# Patient Record
Sex: Male | Born: 1975
Health system: Southern US, Community
[De-identification: ages and names within clinical notes are randomized; demographics above are authoritative.]

## PROBLEM LIST (undated history)

## (undated) DIAGNOSIS — K760 Fatty (change of) liver, not elsewhere classified: Secondary | ICD-10-CM

## (undated) DIAGNOSIS — F329 Major depressive disorder, single episode, unspecified: Secondary | ICD-10-CM

## (undated) DIAGNOSIS — K921 Melena: Secondary | ICD-10-CM

## (undated) DIAGNOSIS — F1911 Other psychoactive substance abuse, in remission: Secondary | ICD-10-CM

## (undated) DIAGNOSIS — F32A Depression, unspecified: Secondary | ICD-10-CM

## (undated) DIAGNOSIS — R51 Headache: Secondary | ICD-10-CM

## (undated) DIAGNOSIS — R7989 Other specified abnormal findings of blood chemistry: Secondary | ICD-10-CM

## (undated) DIAGNOSIS — F4310 Post-traumatic stress disorder, unspecified: Secondary | ICD-10-CM

## (undated) DIAGNOSIS — F319 Bipolar disorder, unspecified: Secondary | ICD-10-CM

## (undated) DIAGNOSIS — I10 Essential (primary) hypertension: Secondary | ICD-10-CM

## (undated) DIAGNOSIS — J189 Pneumonia, unspecified organism: Secondary | ICD-10-CM

## (undated) DIAGNOSIS — R519 Headache, unspecified: Secondary | ICD-10-CM

## (undated) DIAGNOSIS — F1011 Alcohol abuse, in remission: Secondary | ICD-10-CM

## (undated) HISTORY — DX: Post-traumatic stress disorder, unspecified: F43.10

## (undated) HISTORY — DX: Other specified abnormal findings of blood chemistry: R79.89

## (undated) HISTORY — DX: Essential (primary) hypertension: I10

## (undated) HISTORY — DX: Major depressive disorder, single episode, unspecified: F32.9

## (undated) HISTORY — DX: Depression, unspecified: F32.A

## (undated) HISTORY — PX: APPENDECTOMY: SHX54

## (undated) HISTORY — DX: Pneumonia, unspecified organism: J18.9

## (undated) HISTORY — DX: Alcohol abuse, in remission: F10.11

## (undated) HISTORY — DX: Bipolar disorder, unspecified: F31.9

## (undated) HISTORY — DX: Other psychoactive substance abuse, in remission: F19.11

## (undated) HISTORY — DX: Melena: K92.1

---

## 2004-09-15 ENCOUNTER — Ambulatory Visit: Payer: Self-pay | Admitting: Family Medicine

## 2005-04-26 ENCOUNTER — Ambulatory Visit: Payer: Self-pay | Admitting: Family Medicine

## 2005-05-08 ENCOUNTER — Ambulatory Visit: Payer: Self-pay | Admitting: Family Medicine

## 2005-10-31 ENCOUNTER — Encounter: Admission: RE | Admit: 2005-10-31 | Discharge: 2005-10-31 | Payer: Self-pay | Admitting: Urology

## 2005-11-05 ENCOUNTER — Ambulatory Visit (HOSPITAL_COMMUNITY): Admission: RE | Admit: 2005-11-05 | Discharge: 2005-11-05 | Payer: Self-pay | Admitting: Interventional Radiology

## 2006-03-25 ENCOUNTER — Emergency Department (HOSPITAL_COMMUNITY): Admission: EM | Admit: 2006-03-25 | Discharge: 2006-03-25 | Payer: Self-pay | Admitting: Emergency Medicine

## 2006-04-01 ENCOUNTER — Emergency Department (HOSPITAL_COMMUNITY): Admission: EM | Admit: 2006-04-01 | Discharge: 2006-04-01 | Payer: Self-pay | Admitting: Emergency Medicine

## 2007-05-11 ENCOUNTER — Emergency Department (HOSPITAL_COMMUNITY): Admission: EM | Admit: 2007-05-11 | Discharge: 2007-05-11 | Payer: Self-pay | Admitting: Emergency Medicine

## 2011-02-16 NOTE — Consult Note (Signed)
Villa, Johnny             ACCOUNT NO.:  [id number]   MEDICAL RECORD NO.:  [id number]          PATIENT TYPE:  EMS   LOCATION:  [facility]                         FACILITY:  [facility]   PHYSICIAN:  Pramod P. Pearlean Brownie, MD    DATE OF BIRTH:  October 29, 1975   DATE OF CONSULTATION:  DATE OF DISCHARGE:  03/25/2006                                   CONSULTATION   REASON FOR REFERRAL:  Neck and back pain.   HISTORY OF PRESENT ILLNESS:  [name] is a 35 year old Caucasian male who  has been complaining of severe neck and back pain for the last several  months.  He apparently has a known history of old C2 spine injury following  college football injury.  He has also had some low back pain which he calls  as pain from having six lumbar vertebrae.  He claims he has had extensive  neurological workup done in [place], where he has seen a neurologist,  Dr. Loleta Chance.  He has undergone an MRI scan of cervical and lumbar spine and has  been found to have significant degenerative C-spine and lower L-spine  disease.  He was in fact referred to a neurosurgeon, Dr. Casilda Carls, but  the patient has not seen him yet.  He has had the constant severe pain for  the last one month.  In fact, for the last three days he claims he has not  slept.  He was given prescription narcotics by Dr. Loleta Chance, but he has not been  happy with his practice and was planning to shift is care to the [place]  area.  The patient denies any significant pain from his neck shooting down  his arm but states the pain shoots down the spine.  He said he had a  prescription for hydrocodone, but his girlfriend flushed it down the toilet  three days ago, and he has been in such severe pain that he has not slept.  He denies significant trouble walking, lack of bladder or bowel control, or  numbness or lack of feeling in his hands or legs.  He is otherwise healthy  and has had no other medical problems.  He claims he has tried Lyrica which  he  reacted to, and he did not like Topamax either.  He has tried muscle  relaxants which have not worked.  He has been to a [place] and recently  went to him two days ago.  Actually, the pain got worse since that visit.   MEDICATION ALLERGIES:  None.   HOME MEDICATIONS:  1.  Keppra 3 g a day.  2.  Valium mg 3 times a day.  3.  Hydrocodone as needed.   PAST SURGICAL HISTORY:  None.   SOCIAL HISTORY:  The patient lives with his girlfriend.  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  He denies abusing drugs.   REVIEW OF SYSTEMS:  As stated above, positive for neck and back pain, gait  difficulties, difficulty sleeping.   PHYSICAL EXAMINATION:  GENERAL:  A young healthy-looking Caucasian male who  is not in distress.  VITAL  SIGNS:  Afebrile.  Pulse is 70 per minute, regular, respiratory rate  16 per minute.  Distal pulses well felt.  HEENT:  Head is nontraumatic.  NECK:  Supple.  I did not see significant tenderness or muscle spasm.  CARDIAC:  Regular heart sounds.  NEUROLOGIC:  Pleasant, awake, alert, cooperative, with no aphasia, apraxia,  or dysarthria.  Pupils are equal, reactive to light and accommodation.  Face  is symmetric.  There are a few beats of end-gaze nystagmus in either  direction on horizontal gaze.  Palatal movements are normal.  Tongue is  midline.  Motor system exam reveals no upper extremity drift, symmetric  strength, tone.  Reflexes all brisk.  Plantars are downgoing.  Straight leg  raising test is negative bilaterally.  Examination of the low back does not  reveal significant muscle spasm or deformity.  The patient complains of  diffuse tenderness throughout over the entire spine.  His gait is painful  and slow.   DATA REVIEWED:  No previous records are available.  Treatment records from  Dr. his office awaited.   IMPRESSION:  A 35 year old male with severe neck and low back pain which  appears to be chronic and musculoskeletal in etiology.  The  neurological  exam is nonfocal, but the patient claims he has significant degenerative C-  and L-spine disease and has had a recent workup in [place] which is  not available yet.  The patient seems to be in significant distress.  I am  reluctant to prescribe long-term narcotics to this patient without reviewing  his previous records.  I have explained to him that it is not my  neurological practice to treat patient with long- narcotics. However, we are  willing to treat him with short-term narcotics for his present pain, but I  have made him a referral to a pain clinic, to Dr. Sheran Luz, for more  definitive treatment for his degenerative disease.  I have advised him to  start Neurontin 300, 3 times a day, in addition to his Keppra.  He will  follow up in the future with his neurologist, Dr. Loleta Chance in [place], and  with Dr. Sheran Luz of interventional pain management.           ______________________________  Sunny Schlein. Pearlean Brownie, MD     PPS/MEDQ  D:  03/25/2006  T:  03/26/2006  Job:  [id number]

## 2011-07-16 LAB — RAPID STREP SCREEN (MED CTR MEBANE ONLY): Streptococcus, Group A Screen (Direct): NEGATIVE

## 2017-10-24 ENCOUNTER — Encounter (INDEPENDENT_AMBULATORY_CARE_PROVIDER_SITE_OTHER): Payer: Self-pay

## 2017-10-24 ENCOUNTER — Ambulatory Visit (INDEPENDENT_AMBULATORY_CARE_PROVIDER_SITE_OTHER): Payer: Medicare PPO

## 2017-10-24 ENCOUNTER — Ambulatory Visit: Payer: Medicare PPO | Admitting: Internal Medicine

## 2017-10-24 ENCOUNTER — Telehealth: Payer: Self-pay | Admitting: Radiology

## 2017-10-24 VITALS — BP 116/88 | HR 60 | Temp 98.1°F | Resp 16 | Ht 71.0 in | Wt 218.4 lb

## 2017-10-24 DIAGNOSIS — G8929 Other chronic pain: Secondary | ICD-10-CM | POA: Diagnosis not present

## 2017-10-24 DIAGNOSIS — M2550 Pain in unspecified joint: Secondary | ICD-10-CM

## 2017-10-24 DIAGNOSIS — Z5181 Encounter for therapeutic drug level monitoring: Secondary | ICD-10-CM

## 2017-10-24 DIAGNOSIS — M25551 Pain in right hip: Secondary | ICD-10-CM | POA: Diagnosis not present

## 2017-10-24 DIAGNOSIS — Z1329 Encounter for screening for other suspected endocrine disorder: Secondary | ICD-10-CM

## 2017-10-24 DIAGNOSIS — M25561 Pain in right knee: Secondary | ICD-10-CM

## 2017-10-24 DIAGNOSIS — R7989 Other specified abnormal findings of blood chemistry: Secondary | ICD-10-CM

## 2017-10-24 DIAGNOSIS — F319 Bipolar disorder, unspecified: Secondary | ICD-10-CM

## 2017-10-24 DIAGNOSIS — M545 Low back pain: Secondary | ICD-10-CM

## 2017-10-24 DIAGNOSIS — M5440 Lumbago with sciatica, unspecified side: Secondary | ICD-10-CM | POA: Diagnosis not present

## 2017-10-24 DIAGNOSIS — E291 Testicular hypofunction: Secondary | ICD-10-CM | POA: Diagnosis not present

## 2017-10-24 DIAGNOSIS — Z13220 Encounter for screening for lipoid disorders: Secondary | ICD-10-CM

## 2017-10-24 DIAGNOSIS — N529 Male erectile dysfunction, unspecified: Secondary | ICD-10-CM

## 2017-10-24 MED ORDER — CELECOXIB 100 MG PO CAPS: ORAL_CAPSULE | ORAL | 0 refills | Status: DC

## 2017-10-24 NOTE — Patient Instructions (Addendum)
Follow up in 1 month sooner if needed  Try Celebrex 1-2 x per day Do not take with other NSAIDS (Aleve, Ibuprofen, Motrin) Take care  Consider sports medicine or orthopedic consult here Dr.Poggi/Hooten or Raliegh Ip in [place] is this medicine? CELECOXIB (sell a KOX ib) is a non-steroidal anti-inflammatory drug (NSAID). This medicine is used to treat arthritis and ankylosing spondylitis. It may be also used for pain or painful monthly periods. This medicine may be used for other purposes; ask your health care provider or pharmacist if you have questions. COMMON BRAND NAME(S): Celebrex What should I tell my health care provider before I take this medicine? They need to know if you have any of these conditions: -asthma -coronary artery bypass graft (CABG) surgery within the past 2 weeks -drink more than 3 alcohol-containing drinks a day -heart disease or circulation problems like heart failure or leg edema (fluid retention) -high blood pressure -kidney disease -liver disease -stomach bleeding or ulcers -an unusual or allergic reaction to celecoxib, sulfa drugs, aspirin, other NSAIDs, other medicines, foods, dyes, or preservatives -pregnant or trying to get pregnant -breast-feeding How should I use this medicine? Take this medicine by mouth with a full glass of water. Follow the directions on the prescription label. Take it with food if it upsets your stomach or if you take 400 mg at one time. Try to not lie down for at least 10 minutes after you take the medicine. Take the medicine at the same time each day. Do not take more medicine than you are told to take. Long-term, continuous use may increase the risk of heart attack or stroke. A special MedGuide will be given to you by the pharmacist with each prescription and refill. Be sure to read this information carefully each time. Talk to your pediatrician regarding the use of this medicine in children. Special care may be  needed. Overdosage: If you think you have taken too much of this medicine contact a poison control center or emergency room at once. NOTE: This medicine is only for you. Do not share this medicine with others. What if I miss a dose? If you miss a dose, take it as soon as you can. If it is almost time for your next dose, take only that dose. Do not take double or extra doses. What may interact with this medicine? Do not take this medicine with any of the following medications: -cidofovir -methotrexate -other NSAIDs, medicines for pain and inflammation, like ibuprofen or naproxen -pemetrexed This medicine may also interact with the following medications: -alcohol -aspirin and aspirin-like drugs -diuretics -fluconazole -lithium -medicines for high blood pressure -steroid medicines like prednisone or cortisone -warfarin This list may not describe all possible interactions. Give your health care provider a list of all the medicines, herbs, non-prescription drugs, or dietary supplements you use. Also tell them if you smoke, drink alcohol, or use illegal drugs. Some items may interact with your medicine. What should I watch for while using this medicine? Tell your doctor or health care professional if your pain does not get better. Talk to your doctor before taking another medicine for pain. Do not treat yourself. This medicine does not prevent heart attack or stroke. In fact, this medicine may increase the chance of a heart attack or stroke. The chance may increase with longer use of this medicine and in people who have heart disease. If you take aspirin to prevent heart attack or stroke, talk with your doctor or health  care professional. Do not take medicines such as ibuprofen and naproxen with this medicine. Side effects such as stomach upset, nausea, or ulcers may be more likely to occur. Many medicines available without a prescription should not be taken with this medicine. This medicine can  cause ulcers and bleeding in the stomach and intestines at any time during treatment. Ulcers and bleeding can happen without warning symptoms and can cause death. What side effects may I notice from receiving this medicine? Side effects that you should report to your doctor or health care professional as soon as possible: -allergic reactions like skin rash, itching or hives, swelling of the face, lips, or tongue -black or bloody stools, blood in the urine or vomit -blurred vision -breathing problems -chest pain -nausea, vomiting -problems with balance, talking, walking -redness, blistering, peeling or loosening of the skin, including inside the mouth -unexplained weight gain or swelling -unusually weak or tired -yellowing of eyes, skin Side effects that usually do not require medical attention (report to your doctor or health care professional if they continue or are bothersome): -constipation or diarrhea -dizziness -gas or heartburn -upset stomach This list may not describe all possible side effects. Call your doctor for medical advice about side effects. You may report side effects to FDA at 1-800-FDA-1088. Where should I keep my medicine? Keep out of the reach of children. Store at room temperature between 15 and 30 degrees C (59 and 86 degrees F). Keep container tightly closed. Throw away any unused medicine after the expiration date. NOTE: This sheet is a summary. It may not cover all possible information. If you have questions about this medicine, talk to your doctor, pharmacist, or health care provider.  2018 Elsevier/Gold Standard (2009-11-16 10:54:17)   Arthritis Arthritis is a term that is commonly used to refer to joint pain or joint disease. There are more than 100 types of arthritis. What are the causes? The most common cause of this condition is wear and tear of a joint. Other causes include:  Gout.  Inflammation of a joint.  An infection of a joint.  Sprains and  other injuries near the joint.  A drug reaction or allergic reaction.  In some cases, the cause may not be known. What are the signs or symptoms? The main symptom of this condition is pain in the joint with movement. Other symptoms include:  Redness, swelling, or stiffness at a joint.  Warmth coming from the joint.  Fever.  Overall feeling of illness.  How is this diagnosed? This condition may be diagnosed with a physical exam and tests, including:  Blood tests.  Urine tests.  Imaging tests, such as MRI, X-rays, or a CT scan.  Sometimes, fluid is removed from a joint for testing. How is this treated? Treatment for this condition may involve:  Treatment of the cause, if it is known.  Rest.  Raising (elevating) the joint.  Applying cold or hot packs to the joint.  Medicines to improve symptoms and reduce inflammation.  Injections of a steroid such as cortisone into the joint to help reduce pain and inflammation.  Depending on the cause of your arthritis, you may need to make lifestyle changes to reduce stress on your joint. These changes may include exercising more and losing weight. Follow these instructions at home: Medicines  Take over-the-counter and prescription medicines only as told by your health care provider.  Do not take aspirin to relieve pain if gout is suspected. Activity  Rest your joint if told  by your health care provider. Rest is important when your disease is active and your joint feels painful, swollen, or stiff.  Avoid activities that make the pain worse. It is important to balance activity with rest.  Exercise your joint regularly with range-of-motion exercises as told by your health care provider. Try doing low-impact exercise, such as: ? Swimming. ? Water aerobics. ? Biking. ? Walking. Joint Care   If your joint is swollen, keep it elevated if told by your health care provider.  If your joint feels stiff in the morning, try taking  a warm shower.  If directed, apply heat to the joint. If you have diabetes, do not apply heat without permission from your health care provider. ? Put a towel between the joint and the hot pack or heating pad. ? Leave the heat on the area for 20-30 minutes.  If directed, apply ice to the joint: ? Put ice in a plastic bag. ? Place a towel between your skin and the bag. ? Leave the ice on for 20 minutes, 2-3 times per day.  Keep all follow-up visits as told by your health care provider. This is important. Contact a health care provider if:  The pain gets worse.  You have a fever. Get help right away if:  You develop severe joint pain, swelling, or redness.  Many joints become painful and swollen.  You develop severe back pain.  You develop severe weakness in your leg.  You cannot control your bladder or bowels. This information is not intended to replace advice given to you by your health care provider. Make sure you discuss any questions you have with your health care provider. Document Released: 10/25/2004 Document Revised: 02/23/2016 Document Reviewed: 12/13/2014 Elsevier Interactive Patient Education  [employer].

## 2017-10-24 NOTE — Telephone Encounter (Signed)
Pt coming in for labs tomorrow, please place future orders. Thank you.  

## 2017-10-25 ENCOUNTER — Other Ambulatory Visit (INDEPENDENT_AMBULATORY_CARE_PROVIDER_SITE_OTHER): Payer: Medicare PPO

## 2017-10-25 DIAGNOSIS — Z5181 Encounter for therapeutic drug level monitoring: Secondary | ICD-10-CM

## 2017-10-25 DIAGNOSIS — R7989 Other specified abnormal findings of blood chemistry: Secondary | ICD-10-CM | POA: Diagnosis not present

## 2017-10-25 DIAGNOSIS — F319 Bipolar disorder, unspecified: Secondary | ICD-10-CM

## 2017-10-25 DIAGNOSIS — M2550 Pain in unspecified joint: Secondary | ICD-10-CM

## 2017-10-25 DIAGNOSIS — Z1329 Encounter for screening for other suspected endocrine disorder: Secondary | ICD-10-CM

## 2017-10-25 DIAGNOSIS — Z13220 Encounter for screening for lipoid disorders: Secondary | ICD-10-CM

## 2017-10-26 ENCOUNTER — Other Ambulatory Visit: Payer: Self-pay | Admitting: Internal Medicine

## 2017-10-26 DIAGNOSIS — R739 Hyperglycemia, unspecified: Secondary | ICD-10-CM

## 2017-10-26 LAB — URINALYSIS, ROUTINE W REFLEX MICROSCOPIC
Bilirubin, UA: NEGATIVE
Glucose, UA: NEGATIVE
Ketones, UA: NEGATIVE
Leukocytes, UA: NEGATIVE
Nitrite, UA: NEGATIVE
Protein, UA: NEGATIVE
RBC, UA: NEGATIVE
Specific Gravity, UA: 1.022 (ref 1.005–1.030)
Urobilinogen, Ur: 0.2 mg/dL (ref 0.2–1.0)
pH, UA: 5.5 (ref 5.0–7.5)

## 2017-10-28 ENCOUNTER — Other Ambulatory Visit: Payer: Self-pay | Admitting: Radiology

## 2017-10-28 LAB — COMPREHENSIVE METABOLIC PANEL
ALT: 43 IU/L (ref 0–44)
AST: 21 IU/L (ref 0–40)
Albumin/Globulin Ratio: 1.9 (ref 1.2–2.2)
Albumin: 4.7 g/dL (ref 3.5–5.5)
Alkaline Phosphatase: 34 IU/L — ABNORMAL LOW (ref 39–117)
BUN/Creatinine Ratio: 15 (ref 9–20)
BUN: 16 mg/dL (ref 6–20)
Bilirubin Total: 0.7 mg/dL (ref 0.0–1.2)
CO2: 24 mmol/L (ref 20–29)
Calcium: 10.1 mg/dL (ref 8.7–10.2)
Chloride: 102 mmol/L (ref 96–106)
Creatinine, Ser: 1.05 mg/dL (ref 0.76–1.27)
GFR calc Af Amer: 103 mL/min/{1.73_m2} (ref >59)
GFR calc non Af Amer: 89 mL/min/{1.73_m2} (ref >59)
Globulin, Total: 2.5 g/dL (ref 1.5–4.5)
Glucose: 125 mg/dL — ABNORMAL HIGH (ref 65–99)
Potassium: 4.5 mmol/L (ref 3.5–5.2)
Sodium: 139 mmol/L (ref 134–144)
Total Protein: 7.2 g/dL (ref 6.0–8.5)

## 2017-10-28 LAB — RHEUMATOID FACTOR: Rhuematoid fact SerPl-aCnc: <10 IU/mL (ref 0.0–13.9)

## 2017-10-28 LAB — C-REACTIVE PROTEIN: CRP: 1 mg/L (ref 0.0–4.9)

## 2017-10-28 LAB — CBC WITH DIFFERENTIAL/PLATELET
Basophils Absolute: 0.1 10*3/uL (ref 0.0–0.2)
Basos: 1 %
EOS (ABSOLUTE): 0.2 10*3/uL (ref 0.0–0.4)
Eos: 3 %
Hematocrit: 45.5 % (ref 37.5–51.0)
Hemoglobin: 15.2 g/dL (ref 13.0–17.7)
Immature Grans (Abs): 0 10*3/uL (ref 0.0–0.1)
Immature Granulocytes: 0 %
Lymphocytes Absolute: 1.6 10*3/uL (ref 0.7–3.1)
Lymphs: 29 %
MCH: 31.5 pg (ref 26.6–33.0)
MCHC: 33.4 g/dL (ref 31.5–35.7)
MCV: 94 fL (ref 79–97)
Monocytes Absolute: 0.4 10*3/uL (ref 0.1–0.9)
Monocytes: 8 %
Neutrophils Absolute: 3.3 10*3/uL (ref 1.4–7.0)
Neutrophils: 59 %
Platelets: 251 10*3/uL (ref 150–379)
RBC: 4.83 x10E6/uL (ref 4.14–5.80)
RDW: 13.7 % (ref 12.3–15.4)
WBC: 5.6 10*3/uL (ref 3.4–10.8)

## 2017-10-28 LAB — LIPID PANEL
Chol/HDL Ratio: 4.5 ratio (ref 0.0–5.0)
Cholesterol, Total: 188 mg/dL (ref 100–199)
HDL: 42 mg/dL (ref >39)
LDL Calculated: 114 mg/dL — ABNORMAL HIGH (ref 0–99)
Triglycerides: 158 mg/dL — ABNORMAL HIGH (ref 0–149)
VLDL Cholesterol Cal: 32 mg/dL (ref 5–40)

## 2017-10-28 LAB — LITHIUM LEVEL: Lithium Lvl: 0.8 mmol/L (ref 0.6–1.2)

## 2017-10-28 LAB — TESTOSTERONE: Testosterone: 743 ng/dL (ref 264–916)

## 2017-10-28 LAB — T4, FREE: Free T4: 1.07 ng/dL (ref 0.82–1.77)

## 2017-10-28 LAB — SEDIMENTATION RATE: Sed Rate: 5 mm/hr (ref 0–15)

## 2017-10-28 LAB — CYCLIC CITRUL PEPTIDE ANTIBODY, IGG/IGA: Cyclic Citrullin Peptide Ab: 4 units (ref 0–19)

## 2017-10-28 LAB — LAMOTRIGINE LEVEL: Lamotrigine Lvl: 1 ug/mL — ABNORMAL LOW (ref 2.0–20.0)

## 2017-10-28 LAB — TSH: TSH: 2.15 u[IU]/mL (ref 0.450–4.500)

## 2017-10-28 LAB — URIC ACID: Uric Acid: 4.4 mg/dL (ref 3.7–8.6)

## 2017-10-28 LAB — TESTOSTERONE, FREE: Testosterone, Free: 20.5 pg/mL (ref 8.7–25.1)

## 2017-10-28 LAB — ANTINUCLEAR ANTIBODIES, IFA: ANA Titer 1: NEGATIVE

## 2017-10-29 ENCOUNTER — Encounter: Payer: Self-pay | Admitting: Internal Medicine

## 2017-10-29 DIAGNOSIS — M25551 Pain in right hip: Secondary | ICD-10-CM | POA: Insufficient documentation

## 2017-10-29 DIAGNOSIS — N529 Male erectile dysfunction, unspecified: Secondary | ICD-10-CM | POA: Insufficient documentation

## 2017-10-29 DIAGNOSIS — R7989 Other specified abnormal findings of blood chemistry: Secondary | ICD-10-CM | POA: Insufficient documentation

## 2017-10-29 DIAGNOSIS — M25561 Pain in right knee: Secondary | ICD-10-CM | POA: Insufficient documentation

## 2017-10-29 DIAGNOSIS — F3130 Bipolar disorder, current episode depressed, mild or moderate severity, unspecified: Secondary | ICD-10-CM | POA: Insufficient documentation

## 2017-10-29 DIAGNOSIS — F319 Bipolar disorder, unspecified: Secondary | ICD-10-CM

## 2017-10-29 DIAGNOSIS — E291 Testicular hypofunction: Secondary | ICD-10-CM | POA: Insufficient documentation

## 2017-10-29 DIAGNOSIS — M549 Dorsalgia, unspecified: Secondary | ICD-10-CM | POA: Insufficient documentation

## 2017-10-29 NOTE — Progress Notes (Signed)
Chief Complaint  Patient presents with  . Establish Care   Establish  1. C/o low back, right thigh pain,  right hip pain radiating to right groin and right knee pain and swelling. Pain worse x 6 months.  He has tried to wear and knee brace to right knee. He reports he previously wrestled, placed football, lacrosse in HS which may source of injuries.  He has not tried any medication and wants to try medications for arthritis and does not want narcotics due to h/o addiction in remission since 2013  2. He requests bloodwork be done today and faxed to psychiatrist Dr. Pattricia Boss    Review of Systems  Constitutional: Negative for weight loss.  HENT: Positive for hearing loss.        Trouble hearing certain sounds  Eyes:       No vision changes   Respiratory: Negative for shortness of breath.   Cardiovascular: Negative for chest pain.  Gastrointestinal: Negative for abdominal pain.  Genitourinary:       +ED  Musculoskeletal: Positive for back pain and joint pain.  Skin: Negative for rash.  Neurological: Negative for headaches.  Psychiatric/Behavioral: Negative for depression.       +h/o bipolar follows psych    Past Medical History:  Diagnosis Date  . Bipolar disorder [facility])    psychiatrist in [place]  . Depression   . Drug abuse in remission   . H/O alcohol abuse   . Hypertension   . Low testosterone in male    Past Surgical History:  Procedure Laterality Date  . APPENDECTOMY     2006   Family History  Problem Relation Age of Onset  . Depression Mother   . Alcohol abuse Father   . Cancer Father        prostate dx'ed 60  . COPD Father   . Hyperlipidemia Father   . Hypertension Father    Social History   Socioeconomic History  . Marital status: Single    Spouse name: Not on file  . Number of children: Not on file  . Years of education: Not on file  . Highest education level: Not on file  Social Needs  . Financial resource strain: Not on file  . Food insecurity - worry:  Not on file  . Food insecurity - inability: Not on file  . Transportation needs - medical: Not on file  . Transportation needs - non-medical: Not on file  Occupational History  . Not on file  Tobacco Use  . Smoking status: Former Research scientist (life sciences)  . Smokeless tobacco: Never Used  Substance and Sexual Activity  . Alcohol use: No    Frequency: Never    Comment: former quit 2013  . Drug use: No    Comment: former quit cocaine 2013   . Sexual activity: Yes  Other Topics Concern  . Not on file  Social History Narrative   No kids girlfriend has 3 kids    MBA   Disabled    Current Meds  Medication Sig  . albuterol (PROVENTIL HFA) 108 (90 Base) MCG/ACT inhaler Inhale into the lungs.  Marland Kitchen buPROPion (WELLBUTRIN XL) 300 MG 24 hr tablet Take 300 mg by mouth daily.   . busPIRone (BUSPAR) 30 MG tablet 2 (two) times daily.   . clomiPHENE (CLOMID) 50 MG tablet 50 mg every other day.   . fluticasone (FLONASE) 50 MCG/ACT nasal spray PLACE 2 SPRAYS INTO EACH NOSTRIL QD  . lamoTRIgine (LAMICTAL) 200 MG tablet Take  by mouth daily.   Marland Kitchen lithium carbonate 300 MG capsule Take 300 mg by mouth daily.   . Melatonin 5 MG CAPS   . Multiple Vitamins-Minerals (MULTIVITAMIN MEN) TABS Take 1 tablet by mouth daily.  . sildenafil (REVATIO) 20 MG tablet Take 40 mg by mouth.  . SUMAtriptan (IMITREX) 100 MG tablet   . [DISCONTINUED] sildenafil (VIAGRA) 50 MG tablet Take 50 mg by mouth daily as needed for erectile dysfunction.   No Known Allergies Recent Results (from the past 2160 hour(s))  Testosterone, free     Status: None   Collection Time: 10/25/17  8:44 AM  Result Value Ref Range   Testosterone, Free 20.5 8.7 - 25.1 pg/mL  Comprehensive metabolic panel     Status: Abnormal   Collection Time: 10/25/17  8:44 AM  Result Value Ref Range   Glucose 125 (H) 65 - 99 mg/dL   BUN 16 6 - 20 mg/dL   Creatinine, Ser 1.05 0.76 - 1.27 mg/dL   GFR calc non Af Amer 89 >59 mL/min/1.73   GFR calc Af Amer 103 >59 mL/min/1.73    BUN/Creatinine Ratio 15 9 - 20   Sodium 139 134 - 144 mmol/L   Potassium 4.5 3.5 - 5.2 mmol/L   Chloride 102 96 - 106 mmol/L   CO2 24 20 - 29 mmol/L   Calcium 10.1 8.7 - 10.2 mg/dL   Total Protein 7.2 6.0 - 8.5 g/dL   Albumin 4.7 3.5 - 5.5 g/dL   Globulin, Total 2.5 1.5 - 4.5 g/dL   Albumin/Globulin Ratio 1.9 1.2 - 2.2   Bilirubin Total 0.7 0.0 - 1.2 mg/dL   Alkaline Phosphatase 34 (L) 39 - 117 IU/L   AST 21 0 - 40 IU/L   ALT 43 0 - 44 IU/L  CBC with Differential/Platelet     Status: None   Collection Time: 10/25/17  8:44 AM  Result Value Ref Range   WBC 5.6 3.4 - 10.8 x10E3/uL   RBC 4.83 4.14 - 5.80 x10E6/uL   Hemoglobin 15.2 13.0 - 17.7 g/dL   Hematocrit 45.5 37.5 - 51.0 %   MCV 94 79 - 97 fL   MCH 31.5 26.6 - 33.0 pg   MCHC 33.4 31.5 - 35.7 g/dL   RDW 13.7 12.3 - 15.4 %   Platelets 251 150 - 379 x10E3/uL   Neutrophils 59 Not Estab. %   Lymphs 29 Not Estab. %   Monocytes 8 Not Estab. %   Eos 3 Not Estab. %   Basos 1 Not Estab. %   Neutrophils Absolute 3.3 1.4 - 7.0 x10E3/uL   Lymphocytes Absolute 1.6 0.7 - 3.1 x10E3/uL   Monocytes Absolute 0.4 0.1 - 0.9 x10E3/uL   EOS (ABSOLUTE) 0.2 0.0 - 0.4 x10E3/uL   Basophils Absolute 0.1 0.0 - 0.2 x10E3/uL   Immature Granulocytes 0 Not Estab. %   Immature Grans (Abs) 0.0 0.0 - 0.1 x10E3/uL  Urinalysis, Routine w reflex microscopic     Status: None   Collection Time: 10/25/17  8:44 AM  Result Value Ref Range   Specific Gravity, UA 1.022 1.005 - 1.030   pH, UA 5.5 5.0 - 7.5   Color, UA Yellow Yellow   Appearance Ur Clear Clear   Leukocytes, UA Negative Negative   Protein, UA Negative Negative/Trace   Glucose, UA Negative Negative   Ketones, UA Negative Negative   RBC, UA Negative Negative   Bilirubin, UA Negative Negative   Urobilinogen, Ur 0.2 0.2 - 1.0 mg/dL  Nitrite, UA Negative Negative   Microscopic Examination Comment     Comment: Microscopic not indicated and not performed.  Lipid panel     Status: Abnormal    Collection Time: 10/25/17  8:44 AM  Result Value Ref Range   Cholesterol, Total 188 100 - 199 mg/dL   Triglycerides 158 (H) 0 - 149 mg/dL   HDL 42 >39 mg/dL   VLDL Cholesterol Cal 32 5 - 40 mg/dL   LDL Calculated 114 (H) 0 - 99 mg/dL   Chol/HDL Ratio 4.5 0.0 - 5.0 ratio    Comment:                                   T. Chol/HDL Ratio                                             Men  Women                               1/2 Avg.Risk  3.4    3.3                                   Avg.Risk  5.0    4.4                                2X Avg.Risk  9.6    7.1                                3X Avg.Risk 23.4   11.0   TSH     Status: None   Collection Time: 10/25/17  8:44 AM  Result Value Ref Range   TSH 2.150 0.450 - 4.500 uIU/mL  T4, free     Status: None   Collection Time: 10/25/17  8:44 AM  Result Value Ref Range   Free T4 1.07 0.82 - 1.77 ng/dL  Lamotrigine level     Status: Abnormal   Collection Time: 10/25/17  8:44 AM  Result Value Ref Range   Lamotrigine Lvl 1.0 (L) 2.0 - 20.0 ug/mL    Comment: This test was developed and its performance characteristics determined by [facility]. It has not been cleared or approved by the Food and Drug Administration.                                 Detection Limit = 1.0   Lithium level     Status: None   Collection Time: 10/25/17  8:44 AM  Result Value Ref Range   Lithium Lvl 0.8 0.6 - 1.2 mmol/L    Comment:                                  Detection Limit = 0.1                           <0.1 indicates None Detected  ANA, IFA (with reflex)     Status: None   Collection Time: 10/25/17  8:44 AM  Result Value Ref Range   ANA Titer 1 Negative     Comment:                                      Negative   <1:80                                      Borderline  1:80                                      Positive   >1:80   Sedimentation rate     Status: None   Collection Time: 10/25/17  8:44 AM  Result Value Ref Range   Sed Rate 5 0 - 15 mm/hr   C-reactive protein     Status: None   Collection Time: 10/25/17  8:44 AM  Result Value Ref Range   CRP 1.0 0.0 - 4.9 mg/L  Rheumatoid Factor     Status: None   Collection Time: 10/25/17  8:44 AM  Result Value Ref Range   Rhuematoid fact SerPl-aCnc <10.0 0.0 - 13.9 IU/mL  Uric acid     Status: None   Collection Time: 10/25/17  8:44 AM  Result Value Ref Range   Uric Acid 4.4 3.7 - 8.6 mg/dL    Comment:            Therapeutic target for gout patients: <6.0  Testosterone     Status: None   Collection Time: 10/25/17  8:44 AM  Result Value Ref Range   Testosterone 743 264 - 916 ng/dL    Comment: Adult male reference interval is based on a population of healthy nonobese males (BMI <30) between 31 and 27 years old. [place], Rochelle [phone]. PMID: [id number].   CYCLIC CITRUL PEPTIDE ANTIBODY, IGG/IGA     Status: None   Collection Time: 10/25/17  8:44 AM  Result Value Ref Range   Cyclic Citrullin Peptide Ab 4 0 - 19 units    Comment:                           Negative               <20                           Weak positive      20 - 39                           Moderate positive  40 - 59                           Strong positive        >59   Specimen status report     Status: None   Collection Time: 10/25/17  8:44 AM  Result Value Ref Range   specimen status report Comment     Comment: Verbal Order See below: Comment: Please provide requested information and fax to [phone] or [phone].  The [place] Code of [employer] requires a written and signed request be forwarded to a laboratory following a verbal order of a laboratory test.  Please assist [place] to meet this requirement and to complete our records. Date:______________________________ ICD-9/10 Diagnosis Code(s):___________________________________________ Physician or Authorized Designee:_____________________________________                                               Please  Print Physician or Authorized Designee Signature: ______________________________________________________________________ Your Passenger transport manager Of The Test(s) Listed Additional Test(s) Requested Comment: Test(s) added per [place] at account 10-28-2017 Logged by Venancio Poisson Test# 737106 Hemoglobin A1c   Hgb A1c w/o eAG     Status: None   Collection Time: 10/25/17  8:44 AM  Result Value Ref Range   Hgb A1c MFr Bld 5.2 4.8 - 5.6 %    Comment:          Prediabetes: 5.7 - 6.4          Diabetes: >6.4          Glycemic control for adults with diabetes: <7.0   Specimen status report     Status: None (Preliminary result)   Collection Time: 10/25/17  8:44 AM  Result Value Ref Range   specimen status report Comment     Comment: Written Authorization Written Authorization    Objective  Body mass index is 30.46 kg/m. Wt Readings from Last 3 Encounters:  10/24/17 218 lb 6 oz (99.1 kg)   Temp Readings from Last 3 Encounters:  10/24/17 98.1 F (36.7 C) (Oral)   BP Readings from Last 3 Encounters:  10/24/17 116/88   Pulse Readings from Last 3 Encounters:  10/24/17 60   O2 sat room air 96%  Physical Exam  Constitutional: He is oriented to person, place, and time and well-developed, well-nourished, and in no distress. Vital signs are normal.  HENT:  Head: Normocephalic and atraumatic.  Mouth/Throat: Oropharynx is clear and moist and mucous membranes are normal.  Eyes: Conjunctivae are normal. Pupils are equal, round, and reactive to light.  Cardiovascular: Normal rate, regular rhythm and normal heart sounds.  Pulmonary/Chest: Effort normal and breath sounds normal.  Abdominal: Soft. Bowel sounds are normal. There is no tenderness.  Musculoskeletal:       Right hip: He exhibits tenderness. He exhibits normal range of motion.       Right knee: He exhibits swelling. He exhibits normal range of motion. Tenderness found.       Lumbar back: He exhibits  tenderness.  Neurological: He is alert and oriented to person, place, and time. Gait normal. Gait normal.  Skin: Skin is warm and dry.  Psychiatric: Mood, memory, affect and judgment normal.  Nursing note and vitals reviewed.   Assessment   1. Low back pain  2. Right hip pain Xray Small old non fused ossicle adjacent to the lateral margin of the acetabulum. 3. Right knee pain >left Xray negative  4. H/o low testosterone, ED and ? If problems with spermatogenesis as reason on clomid for amle  5. H/o bipolar 1 with h/o depression 6. HM Plan  1-3.  Trial of Celebrex Do rheumatoid arthritis w/o to exclude this as etiology  Xray right hip and knee today  Consider SM referral, PT in the future  4. Check testosterone labs Need to get endocrine records from [place] though  pt cant think of name  5. Check lamictal and lithium level  Cont meds and f/u Dr. Whitman Hero Broughton  6.  Had flu shot  Tdap UTD Had Hep A/B vaccines consider check hep B titer in future   Check CMET, CBC, UA, lipid, TSH, T4, testosterone, lithium, lamictal levels   Of note no guns in home, wears seatbelt  Congratulated no etoh since 2013 and also cocaine    Requested records previous PCP, Endocrine, [facility]  Requests labs be done at [place]  1. Psychiatry in [place] Dr. Pattricia Boss  2. Endocrine in [place] seen ? Name  3. [facility] previously pending appt [facility] pain management  4. PCP Dr. Carloyn Manner  Provider: Dr. Olivia Mackie McLean-Scocuzza-Internal Medicine

## 2017-10-30 ENCOUNTER — Telehealth: Payer: Self-pay | Admitting: Internal Medicine

## 2017-10-30 NOTE — Telephone Encounter (Signed)
Is it ok for him to increase dosage of medication? Please advise.

## 2017-10-30 NOTE — Telephone Encounter (Signed)
Spoken to patient he is taking one pill twice a day 200mg  toatal.  He is wondering if he can up it to two twice a day 400mg  total.  He would like to try two first since he has enough medication to do four pills a day. Please advise.

## 2017-10-30 NOTE — Telephone Encounter (Signed)
Copied from [place] [phone]. Topic: Quick Communication - See Telephone Encounter >> Oct 30, 2017  8:57 AM Aurelio Brash B wrote: CRM for notification. See Telephone encounter for:  PT takes 2 celecoxib (CELEBREX) 100 MG capsule  and he doesn't feel like its helping a lot he is asking if he can increase his dosage 10/30/17.

## 2017-10-30 NOTE — Telephone Encounter (Signed)
2x per day is the max dose of celebrex  Has he been taking it 2x per day?  If so we could try something else like Mobic Does he want to do that?

## 2017-10-30 NOTE — Telephone Encounter (Signed)
Max dose is 200 total mg per day  Does he want to try mobic?  Other option would be to see sports medicine to see if there is something they can do further to work up pain Does he want to do this?   [place]

## 2017-10-31 ENCOUNTER — Other Ambulatory Visit: Payer: Self-pay | Admitting: Internal Medicine

## 2017-10-31 DIAGNOSIS — M2550 Pain in unspecified joint: Secondary | ICD-10-CM

## 2017-10-31 MED ORDER — MELOXICAM 7.5 MG PO TABS: 7.5000 mg | ORAL_TABLET | Freq: Every day | ORAL | 0 refills | Status: DC | PRN

## 2017-10-31 NOTE — Telephone Encounter (Signed)
Spoken to patient he is ok with starting Mobic.  He is currently going to Pain management at [facility].  He feels that there would be too much overlap if goes to sports medicine.

## 2017-11-01 NOTE — Telephone Encounter (Signed)
FYI

## 2017-11-01 NOTE — Telephone Encounter (Signed)
Pt called to state that he would like to proceed w/ seeing a sports medicine physician for his injuries, contact pt to advise

## 2017-11-01 NOTE — Telephone Encounter (Signed)
Please advise 

## 2017-11-02 ENCOUNTER — Other Ambulatory Visit: Payer: Self-pay | Admitting: Internal Medicine

## 2017-11-02 NOTE — Telephone Encounter (Signed)
Referral placed to [facility] sports medicine

## 2017-11-02 NOTE — Addendum Note (Signed)
Addended by: Orland Mustard on: 11/02/2017 08:39 PM   Modules accepted: Orders

## 2017-11-04 LAB — SPECIMEN STATUS REPORT

## 2017-11-07 LAB — SPECIMEN STATUS REPORT

## 2017-11-07 LAB — HGB A1C W/O EAG: Hgb A1c MFr Bld: 5.2 % (ref 4.8–5.6)

## 2017-11-11 ENCOUNTER — Encounter: Payer: Self-pay | Admitting: Internal Medicine

## 2017-11-12 ENCOUNTER — Other Ambulatory Visit: Payer: Self-pay | Admitting: Internal Medicine

## 2017-11-12 NOTE — Progress Notes (Signed)
Reviewed [facility] endocrine records 10/07/15 and 03/30/16 hypogonadism, hyperhidrosis  H/o testosterone abuse, etoh and substance abuse, bipolar sx's stable on clomid 50 mg qod x 3 years testosterone was 691 03/30/16 plan was to cont dose x 1 year injections not a good option as he abused in the past. Clonidine in the past dropped BP worsened depression F/u rec in 1 year.    A1C in 2017 was 5.6   TMS

## 2017-11-13 ENCOUNTER — Other Ambulatory Visit: Payer: Self-pay | Admitting: Internal Medicine

## 2017-11-13 ENCOUNTER — Encounter: Payer: Self-pay | Admitting: Internal Medicine

## 2017-11-13 DIAGNOSIS — M2550 Pain in unspecified joint: Secondary | ICD-10-CM

## 2017-11-13 MED ORDER — MELOXICAM 15 MG PO TABS: 15.0000 mg | ORAL_TABLET | Freq: Every day | ORAL | 0 refills | Status: DC | PRN

## 2017-11-14 ENCOUNTER — Encounter: Payer: Self-pay | Admitting: Internal Medicine

## 2017-11-24 ENCOUNTER — Encounter: Payer: Self-pay | Admitting: Intensive Care

## 2017-11-24 ENCOUNTER — Emergency Department
Admission: EM | Admit: 2017-11-24 | Discharge: 2017-11-24 | Disposition: A | Discharge status: Home / Self Care | Payer: Medicare PPO | Attending: Emergency Medicine | Admitting: Emergency Medicine

## 2017-11-24 DIAGNOSIS — Z87891 Personal history of nicotine dependence: Secondary | ICD-10-CM | POA: Insufficient documentation

## 2017-11-24 DIAGNOSIS — Z79899 Other long term (current) drug therapy: Secondary | ICD-10-CM | POA: Insufficient documentation

## 2017-11-24 DIAGNOSIS — R195 Other fecal abnormalities: Secondary | ICD-10-CM | POA: Diagnosis present

## 2017-11-24 DIAGNOSIS — K922 Gastrointestinal hemorrhage, unspecified: Secondary | ICD-10-CM | POA: Insufficient documentation

## 2017-11-24 DIAGNOSIS — I10 Essential (primary) hypertension: Secondary | ICD-10-CM | POA: Insufficient documentation

## 2017-11-24 DIAGNOSIS — K602 Anal fissure, unspecified: Secondary | ICD-10-CM

## 2017-11-24 DIAGNOSIS — F319 Bipolar disorder, unspecified: Secondary | ICD-10-CM | POA: Diagnosis not present

## 2017-11-24 DIAGNOSIS — K644 Residual hemorrhoidal skin tags: Secondary | ICD-10-CM | POA: Diagnosis not present

## 2017-11-24 LAB — COMPREHENSIVE METABOLIC PANEL
ALT: 17 U/L (ref 17–63)
AST: 25 U/L (ref 15–41)
Albumin: 4.3 g/dL (ref 3.5–5.0)
Alkaline Phosphatase: 34 U/L — ABNORMAL LOW (ref 38–126)
Anion gap: 8 (ref 5–15)
BUN: 15 mg/dL (ref 6–20)
CO2: 23 mmol/L (ref 22–32)
Calcium: 9.1 mg/dL (ref 8.9–10.3)
Chloride: 109 mmol/L (ref 101–111)
Creatinine, Ser: 1.04 mg/dL (ref 0.61–1.24)
GFR calc Af Amer: >60 mL/min (ref >60)
GFR calc non Af Amer: >60 mL/min (ref >60)
Glucose, Bld: 144 mg/dL — ABNORMAL HIGH (ref 65–99)
Potassium: 3.8 mmol/L (ref 3.5–5.1)
Sodium: 140 mmol/L (ref 135–145)
Total Bilirubin: 0.7 mg/dL (ref 0.3–1.2)
Total Protein: 7.1 g/dL (ref 6.5–8.1)

## 2017-11-24 LAB — CBC
HCT: 44.1 % (ref 40.0–52.0)
Hemoglobin: 14.8 g/dL (ref 13.0–18.0)
MCH: 32.1 pg (ref 26.0–34.0)
MCHC: 33.6 g/dL (ref 32.0–36.0)
MCV: 95.6 fL (ref 80.0–100.0)
Platelets: 202 10*3/uL (ref 150–440)
RBC: 4.61 MIL/uL (ref 4.40–5.90)
RDW: 13.1 % (ref 11.5–14.5)
WBC: 8.4 10*3/uL (ref 3.8–10.6)

## 2017-11-24 LAB — GROUP A STREP BY PCR: Group A Strep by PCR: NOT DETECTED

## 2017-11-24 LAB — INFLUENZA PANEL BY PCR (TYPE A & B)
Influenza A By PCR: NEGATIVE
Influenza B By PCR: NEGATIVE

## 2017-11-24 MED ORDER — FAMOTIDINE 20 MG PO TABS: 20.0000 mg | ORAL_TABLET | Freq: Two times a day (BID) | ORAL | 0 refills | Status: DC

## 2017-11-24 MED ORDER — HYDROCORTISONE 2.5 % RE CREA: TOPICAL_CREAM | RECTAL | 1 refills | Status: DC

## 2017-11-24 NOTE — ED Triage Notes (Addendum)
Patient c/o blood in stool that started yesterday. Reports HX hemorrhoids. C/o some pain in abdomen. Ambulatory in triage with no problems. Pt also c/o body aches and chills. Reports has kids at home with recent flu symptoms

## 2017-11-24 NOTE — ED Provider Notes (Signed)
[facility] Emergency Department Provider Note  ____________________________________________   First MD Initiated Contact with Patient 11/24/17 1410     (approximate)  I have reviewed the triage vital signs and the nursing notes.   HISTORY  Chief Complaint Blood In Stools   HPI Johnny Villa is a 42 y.o. male who self presents the emergency department with multiple complaints.  He notes that beginning roughly 24 hours ago he had a bowel movement that was painful and associated with dark stools.  Subsequent to that his bowel movements were more and more painful with severe sharp pain right at his anus and he had bright red blood.  He is also had some mild to moderate aching left upper quadrant discomfort recently.  He has a long-standing history of arthritis and takes meloxicam on a chronic basis.  He does report constipation.  He is concerned because he has a history of hemorrhoids.  He is also concerned because he has several sick children at home and has been exposed to both influenza and strep pharyngitis and would like to be tested.  His symptoms are moderate to severe worse with defecation improved when not defecating.  The pain in his anus is nonradiating.  He denies anal receptive intercourse.  Past Medical History:  Diagnosis Date  . Bipolar disorder [facility])    psychiatrist in [place]  . Depression   . Drug abuse in remission   . H/O alcohol abuse   . Hypertension   . Low testosterone in male     Patient Active Problem List   Diagnosis Date Noted  . Back pain 10/29/2017  . Right hip pain 10/29/2017  . Knee pain, right 10/29/2017  . Hypogonadism in male 10/29/2017  . Low testosterone 10/29/2017  . Erectile dysfunction 10/29/2017  . Bipolar 1 disorder ([place]) 10/29/2017    Past Surgical History:  Procedure Laterality Date  . APPENDECTOMY     2006    Prior to Admission medications   Medication Sig Start Date End Date Taking? Authorizing  Provider  albuterol (PROVENTIL HFA) 108 (90 Base) MCG/ACT inhaler Inhale into the lungs. 08/05/17 08/05/18  [provider]  buPROPion (WELLBUTRIN XL) 300 MG 24 hr tablet Take 300 mg by mouth daily.  09/16/17   [provider]  busPIRone (BUSPAR) 30 MG tablet 2 (two) times daily.  09/05/17   [provider]  clomiPHENE (CLOMID) 50 MG tablet 50 mg every other day.  10/22/17   [provider]  famotidine (PEPCID) 20 MG tablet Take 1 tablet (20 mg total) by mouth 2 (two) times daily. 11/24/17 11/24/18  Darel Hong, MD  fluticasone (FLONASE) 50 MCG/ACT nasal spray PLACE 2 SPRAYS INTO EACH NOSTRIL QD 03/29/16   [provider]  hydrocortisone (ANUSOL-HC) 2.5 % rectal cream Apply rectally 2 times daily 11/24/17 11/24/18  Darel Hong, MD  lamoTRIgine (LAMICTAL) 200 MG tablet Take by mouth daily.  09/05/17   [provider]  lithium carbonate 300 MG capsule Take 300 mg by mouth daily.  09/26/17   [provider]  Melatonin 5 MG CAPS  07/22/17   [provider]  Multiple Vitamins-Minerals (MULTIVITAMIN MEN) TABS Take 1 tablet by mouth daily. 07/22/17   [provider]  sildenafil (REVATIO) 20 MG tablet Take 40 mg by mouth.    [provider]  SUMAtriptan (IMITREX) 100 MG tablet  01/03/17   [provider]    Allergies Patient has no known allergies.  Family History  Problem Relation Age of Onset  . Depression Mother   . Alcohol abuse Father   . Cancer Father        prostate dx'ed 36  . COPD Father   . Hyperlipidemia Father   . Hypertension Father     Social History Social History   Tobacco Use  . Smoking status: Former Research scientist (life sciences)  . Smokeless tobacco: Never Used  Substance Use Topics  . Alcohol use: No    Frequency: Never    Comment: former quit 2013  . Drug use: No    Comment: former quit cocaine 2013     Review of Systems Constitutional: No fever/chills Eyes: No visual changes. ENT: No  sore throat. Cardiovascular: Denies chest pain. Respiratory: Denies shortness of breath. Gastrointestinal: Positive for abdominal pain.  No nausea, no vomiting.  No diarrhea.  Positive for constipation. Genitourinary: Negative for dysuria. Musculoskeletal: Negative for back pain. Skin: Negative for rash. Neurological: Negative for headaches, focal weakness or numbness.   ____________________________________________   PHYSICAL EXAM:  VITAL SIGNS: ED Triage Vitals  Enc Vitals Group     BP 11/24/17 1133 127/63     Pulse Rate 11/24/17 1133 70     Resp 11/24/17 1133 16     Temp 11/24/17 1133 98.6 F (37 C)     Temp Source 11/24/17 1133 Oral     SpO2 11/24/17 1133 97 %     Weight 11/24/17 1134 210 lb (95.3 kg)     Height 11/24/17 1134 5\' 11"  (1.803 m)     Head Circumference --      Peak Flow --      Pain Score 11/24/17 1134 6     Pain Loc --      Pain Edu? --      Excl. in [place]? --     Constitutional: Alert and oriented x4 pleasant cooperative mildly anxious appearing Eyes: PERRL EOMI. Head: Atraumatic. Nose: No congestion/rhinnorhea. Mouth/Throat: No trismus Neck: No stridor.   Cardiovascular: Normal rate, regular rhythm. Grossly normal heart sounds.  Good peripheral circulation. Respiratory: Normal respiratory effort.  No retractions. Lungs CTAB and moving good air Gastrointestinal: Soft nondistended nontender no rebound or guarding no peritonitis no McBurney's tenderness negative Rovsing's He does have an external hemorrhoid that is not actively bleeding but has evidence of recent bleed.  I also visualize an anal fissure Musculoskeletal: No lower extremity edema   Neurologic:  Normal speech and language. No gross focal neurologic deficits are appreciated. Skin:  Skin is warm, dry and intact. No rash noted. Psychiatric: Mildly anxious appearing    ____________________________________________   DIFFERENTIAL includes but not limited to  Anal fissure, internal right,  external hemorrhoid, diverticulitis, gastric ulcer ____________________________________________   LABS (all labs ordered are listed, but only abnormal results are displayed)  Labs Reviewed  COMPREHENSIVE METABOLIC PANEL - Abnormal; Notable for the following components:      Result Value   Glucose, Bld 144 (*)    Alkaline Phosphatase 34 (*)    All other components within normal limits  GROUP A STREP BY PCR  CBC  INFLUENZA PANEL BY PCR (TYPE A & B)    Lab work reviewed by me with no signs of anemia Strep and flu negative __________________________________________  EKG   ____________________________________________  RADIOLOGY   ____________________________________________   PROCEDURES  Procedure(s) performed: no  Procedures  Critical Care performed: no  Observation: no ____________________________________________   INITIAL IMPRESSION / ASSESSMENT AND PLAN / ED COURSE  Pertinent labs & imaging  results that were available during my care of the patient were reviewed by me and considered in my medical decision making (see chart for details).  The patient has a number of issues.  Regarding his hematochezia he clearly has recently bleeding external hemorrhoid as well as an anal fissure.  I have educated him on sitz baths and will provide Anusol and have encouraged him to have a high fiber diet and to take Colace as well.  He understands to increase the water in his diet as well.  Regarding his dark stools earlier he very well may have an upper GI bleed secondary to long-term meloxicam use.  I encouraged him to stop taking his nonsteroidals and to begin taking an H2 blocker and to follow-up with gastroenterology.     ----------------------------------------- 10:17 PM on 11/24/2017 -----------------------------------------  Called back to let know strep neg.  Patient verbalizes understanding and appreciation.  He feels  improved. ____________________________________________   FINAL CLINICAL IMPRESSION(S) / ED DIAGNOSES  Final diagnoses:  Anal fissure  External hemorrhoid, bleeding  Upper GI bleed      NEW MEDICATIONS STARTED DURING THIS VISIT:  Discharge Medication List as of 11/24/2017  2:24 PM    START taking these medications   Details  famotidine (PEPCID) 20 MG tablet Take 1 tablet (20 mg total) by mouth 2 (two) times daily., Starting Sun 11/24/2017, Until Mon 11/24/2018, Print    hydrocortisone (ANUSOL-HC) 2.5 % rectal cream Apply rectally 2 times daily, Print         Note:  This document was prepared using Dragon voice recognition software and may include unintentional dictation errors.     Darel Hong, MD 11/25/17 2141

## 2017-11-24 NOTE — Discharge Instructions (Signed)
Please use a sitz bath 2-3 times a day for the next week and make sure you take your antacid twice a day for the next month.  Please stop using Mobic as it can seriously affect your stomach.  Follow-up with the gastroenterologist within 1 month and follow-up with your primary care physician as needed.  Return to the emergency department for any concerns.  It was a pleasure to take care of you today, and thank you for coming to our emergency department.  If you have any questions or concerns before leaving please ask the nurse to grab me and I'm more than happy to go through your aftercare instructions again.  If you were prescribed any opioid pain medication today such as Norco, Vicodin, Percocet, morphine, hydrocodone, or oxycodone please make sure you do not drive when you are taking this medication as it can alter your ability to drive safely.  If you have any concerns once you are home that you are not improving or are in fact getting worse before you can make it to your follow-up appointment, please do not hesitate to call 911 and come back for further evaluation.  Darel Hong, MD  Results for orders placed or performed during the hospital encounter of 11/24/17  Comprehensive metabolic panel  Result Value Ref Range   Sodium 140 135 - 145 mmol/L   Potassium 3.8 3.5 - 5.1 mmol/L   Chloride 109 101 - 111 mmol/L   CO2 23 22 - 32 mmol/L   Glucose, Bld 144 (H) 65 - 99 mg/dL   BUN 15 6 - 20 mg/dL   Creatinine, Ser 1.04 0.61 - 1.24 mg/dL   Calcium 9.1 8.9 - 10.3 mg/dL   Total Protein 7.1 6.5 - 8.1 g/dL   Albumin 4.3 3.5 - 5.0 g/dL   AST 25 15 - 41 U/L   ALT 17 17 - 63 U/L   Alkaline Phosphatase 34 (L) 38 - 126 U/L   Total Bilirubin 0.7 0.3 - 1.2 mg/dL   GFR calc non Af Amer >60 >60 mL/min   GFR calc Af Amer >60 >60 mL/min   Anion gap 8 5 - 15  CBC  Result Value Ref Range   WBC 8.4 3.8 - 10.6 K/uL   RBC 4.61 4.40 - 5.90 MIL/uL   Hemoglobin 14.8 13.0 - 18.0 g/dL   HCT 44.1 40.0 -  52.0 %   MCV 95.6 80.0 - 100.0 fL   MCH 32.1 26.0 - 34.0 pg   MCHC 33.6 32.0 - 36.0 g/dL   RDW 13.1 11.5 - 14.5 %   Platelets 202 150 - 440 K/uL  Influenza panel by PCR (type A & B)  Result Value Ref Range   Influenza A By PCR NEGATIVE NEGATIVE   Influenza B By PCR NEGATIVE NEGATIVE

## 2017-11-24 NOTE — ED Notes (Signed)
ED Provider at bedside. 

## 2017-11-26 ENCOUNTER — Ambulatory Visit: Payer: Medicare PPO | Admitting: Internal Medicine

## 2017-11-26 ENCOUNTER — Encounter: Payer: Self-pay | Admitting: Internal Medicine

## 2017-11-26 ENCOUNTER — Ambulatory Visit (INDEPENDENT_AMBULATORY_CARE_PROVIDER_SITE_OTHER): Payer: Medicare PPO

## 2017-11-26 ENCOUNTER — Ambulatory Visit: Payer: Medicare PPO

## 2017-11-26 VITALS — BP 112/80 | HR 67 | Temp 98.0°F | Resp 18 | Ht 71.0 in | Wt 216.8 lb

## 2017-11-26 DIAGNOSIS — K649 Unspecified hemorrhoids: Secondary | ICD-10-CM

## 2017-11-26 DIAGNOSIS — E291 Testicular hypofunction: Secondary | ICD-10-CM

## 2017-11-26 DIAGNOSIS — G8929 Other chronic pain: Secondary | ICD-10-CM | POA: Diagnosis not present

## 2017-11-26 DIAGNOSIS — M25511 Pain in right shoulder: Secondary | ICD-10-CM | POA: Diagnosis not present

## 2017-11-26 DIAGNOSIS — K921 Melena: Secondary | ICD-10-CM

## 2017-11-26 DIAGNOSIS — M542 Cervicalgia: Secondary | ICD-10-CM | POA: Diagnosis not present

## 2017-11-26 DIAGNOSIS — K602 Anal fissure, unspecified: Secondary | ICD-10-CM | POA: Diagnosis not present

## 2017-11-26 DIAGNOSIS — R1013 Epigastric pain: Secondary | ICD-10-CM

## 2017-11-26 DIAGNOSIS — K922 Gastrointestinal hemorrhage, unspecified: Secondary | ICD-10-CM | POA: Diagnosis not present

## 2017-11-26 MED ORDER — PANTOPRAZOLE SODIUM 40 MG PO TBEC: 40.0000 mg | DELAYED_RELEASE_TABLET | Freq: Every day | ORAL | 1 refills | Status: DC

## 2017-11-26 MED ORDER — CLOMIPHENE CITRATE 50 MG PO TABS: 50.0000 mg | ORAL_TABLET | ORAL | 0 refills | Status: DC

## 2017-11-26 NOTE — Patient Instructions (Signed)
We will refer Dr. Allen Norris or one of his co works for bleeding, anal fissure, hemorrhoids, and epigastric pain  Stop pepcid  Start protonix 40 mg 30 minutes in the am before food  Take care  F/u in 4-6 weeks sooner if needed  I referred you to Endocrine   Gastrointestinal Bleeding Gastrointestinal bleeding is bleeding somewhere along the path food travels through the body (digestive tract). This path is anywhere between the mouth and the opening of the butt (anus). You may have blood in your poop (stools) or have black poop. If you throw up (vomit), there may be blood in it. This condition can be mild, serious, or even life-threatening. If you have a lot of bleeding, you may need to stay in the hospital. Follow these instructions at home:  Take over-the-counter and prescription medicines only as told by your doctor.  Eat foods that have a lot of fiber in them. These foods include whole grains, fruits, and vegetables. You can also try eating 1-3 prunes each day.  Drink enough fluid to keep your pee (urine) clear or pale yellow.  Keep all follow-up visits as told by your doctor. This is important. Contact a doctor if:  Your symptoms do not get better. Get help right away if:  Your bleeding gets worse.  You feel dizzy or you pass out (faint).  You feel weak.  You have very bad cramps in your back or belly (abdomen).  You pass large clumps of blood (clots) in your poop.  Your symptoms are getting worse. This information is not intended to replace advice given to you by your health care provider. Make sure you discuss any questions you have with your health care provider. Document Released: 06/26/2008 Document Revised: 02/23/2016 Document Reviewed: 03/07/2015 Elsevier Interactive Patient Education  2018 [employer].

## 2017-11-26 NOTE — Progress Notes (Signed)
Pre-visit discussion using our clinic review tool. No additional management support is needed unless otherwise documented below in the visit note.  

## 2017-11-28 ENCOUNTER — Encounter: Payer: Self-pay | Admitting: Internal Medicine

## 2017-11-28 DIAGNOSIS — M25511 Pain in right shoulder: Secondary | ICD-10-CM

## 2017-11-28 DIAGNOSIS — K602 Anal fissure, unspecified: Secondary | ICD-10-CM | POA: Insufficient documentation

## 2017-11-28 DIAGNOSIS — K649 Unspecified hemorrhoids: Secondary | ICD-10-CM | POA: Insufficient documentation

## 2017-11-28 DIAGNOSIS — R1013 Epigastric pain: Secondary | ICD-10-CM | POA: Insufficient documentation

## 2017-11-28 DIAGNOSIS — K921 Melena: Secondary | ICD-10-CM | POA: Insufficient documentation

## 2017-11-28 DIAGNOSIS — M542 Cervicalgia: Secondary | ICD-10-CM | POA: Insufficient documentation

## 2017-11-28 DIAGNOSIS — G8929 Other chronic pain: Secondary | ICD-10-CM | POA: Insufficient documentation

## 2017-11-28 NOTE — Progress Notes (Signed)
Chief Complaint  Patient presents with  . Follow-up    right leg and hip pain has received injection from ortho helping.   Follow up  1. Blood in stool saw ED 11/24/17 for epigastric pain, anal fissure external hemorrhoid he is still having tar to bright red stools 1x per day. He was on celebrex stopped due to did not work and mobic due to joint pains which was stopped. ED provider c/w GI ulcer 2. Needs refill Clomid reviewed with Endocrine and I disc with pt I feel more comfortable with Endocrine referral managing this obtained prev. Endocrine notes will fill until can establish with endocrine  3. Right shoulder pain chronic and neck pain will Xray today. He did see Dr. Raliegh Ip [facility] for right hip and knee pains and is in PT and pending pain clinic appt.  He did have right groin/hip injection with ortho which helped.      Review of Systems  Constitutional: Negative for weight loss.  HENT: Negative for hearing loss.   Eyes: Negative for blurred vision.  Respiratory: Negative for shortness of breath.   Cardiovascular: Negative for chest pain.  Gastrointestinal: Positive for abdominal pain and blood in stool.  Musculoskeletal: Positive for joint pain and neck pain.  Skin: Negative for rash.  Neurological: Negative for headaches.  Psychiatric/Behavioral: Negative for memory loss.   Past Medical History:  Diagnosis Date  . Bipolar disorder [facility])    psychiatrist in [place]   . Blood in stool   . Depression   . Drug abuse in remission   . H/O alcohol abuse   . Hypertension   . Low testosterone in male    Past Surgical History:  Procedure Laterality Date  . APPENDECTOMY     2006   Family History  Problem Relation Age of Onset  . Depression Mother   . Alcohol abuse Father   . Cancer Father        prostate dx'ed 2  . COPD Father   . Hyperlipidemia Father   . Hypertension Father    Social History   Socioeconomic History  . Marital status: Single    Spouse name: Not on  file  . Number of children: Not on file  . Years of education: Not on file  . Highest education level: Not on file  Social Needs  . Financial resource strain: Not on file  . Food insecurity - worry: Not on file  . Food insecurity - inability: Not on file  . Transportation needs - medical: Not on file  . Transportation needs - non-medical: Not on file  Occupational History  . Not on file  Tobacco Use  . Smoking status: Former Research scientist (life sciences)  . Smokeless tobacco: Never Used  Substance and Sexual Activity  . Alcohol use: No    Frequency: Never    Comment: former quit 2013  . Drug use: No    Comment: former quit cocaine 2013   . Sexual activity: Yes  Other Topics Concern  . Not on file  Social History Narrative   No kids girlfriend has 3 kids    MBA   Disabled    Current Meds  Medication Sig  . buPROPion (WELLBUTRIN XL) 300 MG 24 hr tablet Take 300 mg by mouth daily.   . busPIRone (BUSPAR) 30 MG tablet 2 (two) times daily.   . clomiPHENE (CLOMID) 50 MG tablet Take 1 tablet (50 mg total) by mouth every other day.  . fluticasone (FLONASE) 50 MCG/ACT nasal spray  PLACE 2 SPRAYS INTO EACH NOSTRIL QD  . hydrocortisone (ANUSOL-HC) 2.5 % rectal cream Apply rectally 2 times daily  . lamoTRIgine (LAMICTAL) 200 MG tablet Take by mouth daily.   Marland Kitchen lithium carbonate 300 MG capsule Take 300 mg by mouth daily.   . Melatonin 5 MG CAPS   . Multiple Vitamins-Minerals (MULTIVITAMIN MEN) TABS Take 1 tablet by mouth daily.  . sildenafil (REVATIO) 20 MG tablet Take 40 mg by mouth.  . SUMAtriptan (IMITREX) 100 MG tablet   . traZODone (DESYREL) 100 MG tablet Take 200 mg by mouth at bedtime.  . [DISCONTINUED] clomiPHENE (CLOMID) 50 MG tablet 50 mg every other day.   . [DISCONTINUED] famotidine (PEPCID) 20 MG tablet Take 1 tablet (20 mg total) by mouth 2 (two) times daily.   No Known Allergies Recent Results (from the past 2160 hour(s))  Testosterone, free     Status: None   Collection Time: 10/25/17  8:44  AM  Result Value Ref Range   Testosterone, Free 20.5 8.7 - 25.1 pg/mL  Comprehensive metabolic panel     Status: Abnormal   Collection Time: 10/25/17  8:44 AM  Result Value Ref Range   Glucose 125 (H) 65 - 99 mg/dL   BUN 16 6 - 20 mg/dL   Creatinine, Ser 1.05 0.76 - 1.27 mg/dL   GFR calc non Af Amer 89 >59 mL/min/1.73   GFR calc Af Amer 103 >59 mL/min/1.73   BUN/Creatinine Ratio 15 9 - 20   Sodium 139 134 - 144 mmol/L   Potassium 4.5 3.5 - 5.2 mmol/L   Chloride 102 96 - 106 mmol/L   CO2 24 20 - 29 mmol/L   Calcium 10.1 8.7 - 10.2 mg/dL   Total Protein 7.2 6.0 - 8.5 g/dL   Albumin 4.7 3.5 - 5.5 g/dL   Globulin, Total 2.5 1.5 - 4.5 g/dL   Albumin/Globulin Ratio 1.9 1.2 - 2.2   Bilirubin Total 0.7 0.0 - 1.2 mg/dL   Alkaline Phosphatase 34 (L) 39 - 117 IU/L   AST 21 0 - 40 IU/L   ALT 43 0 - 44 IU/L  CBC with Differential/Platelet     Status: None   Collection Time: 10/25/17  8:44 AM  Result Value Ref Range   WBC 5.6 3.4 - 10.8 x10E3/uL   RBC 4.83 4.14 - 5.80 x10E6/uL   Hemoglobin 15.2 13.0 - 17.7 g/dL   Hematocrit 45.5 37.5 - 51.0 %   MCV 94 79 - 97 fL   MCH 31.5 26.6 - 33.0 pg   MCHC 33.4 31.5 - 35.7 g/dL   RDW 13.7 12.3 - 15.4 %   Platelets 251 150 - 379 x10E3/uL   Neutrophils 59 Not Estab. %   Lymphs 29 Not Estab. %   Monocytes 8 Not Estab. %   Eos 3 Not Estab. %   Basos 1 Not Estab. %   Neutrophils Absolute 3.3 1.4 - 7.0 x10E3/uL   Lymphocytes Absolute 1.6 0.7 - 3.1 x10E3/uL   Monocytes Absolute 0.4 0.1 - 0.9 x10E3/uL   EOS (ABSOLUTE) 0.2 0.0 - 0.4 x10E3/uL   Basophils Absolute 0.1 0.0 - 0.2 x10E3/uL   Immature Granulocytes 0 Not Estab. %   Immature Grans (Abs) 0.0 0.0 - 0.1 x10E3/uL  Urinalysis, Routine w reflex microscopic     Status: None   Collection Time: 10/25/17  8:44 AM  Result Value Ref Range   Specific Gravity, UA 1.022 1.005 - 1.030   pH, UA 5.5 5.0 - 7.5  Color, UA Yellow Yellow   Appearance Ur Clear Clear   Leukocytes, UA Negative Negative    Protein, UA Negative Negative/Trace   Glucose, UA Negative Negative   Ketones, UA Negative Negative   RBC, UA Negative Negative   Bilirubin, UA Negative Negative   Urobilinogen, Ur 0.2 0.2 - 1.0 mg/dL   Nitrite, UA Negative Negative   Microscopic Examination Comment     Comment: Microscopic not indicated and not performed.  Lipid panel     Status: Abnormal   Collection Time: 10/25/17  8:44 AM  Result Value Ref Range   Cholesterol, Total 188 100 - 199 mg/dL   Triglycerides 158 (H) 0 - 149 mg/dL   HDL 42 >39 mg/dL   VLDL Cholesterol Cal 32 5 - 40 mg/dL   LDL Calculated 114 (H) 0 - 99 mg/dL   Chol/HDL Ratio 4.5 0.0 - 5.0 ratio    Comment:                                   T. Chol/HDL Ratio                                             Men  Women                               1/2 Avg.Risk  3.4    3.3                                   Avg.Risk  5.0    4.4                                2X Avg.Risk  9.6    7.1                                3X Avg.Risk 23.4   11.0   TSH     Status: None   Collection Time: 10/25/17  8:44 AM  Result Value Ref Range   TSH 2.150 0.450 - 4.500 uIU/mL  T4, free     Status: None   Collection Time: 10/25/17  8:44 AM  Result Value Ref Range   Free T4 1.07 0.82 - 1.77 ng/dL  Lamotrigine level     Status: Abnormal   Collection Time: 10/25/17  8:44 AM  Result Value Ref Range   Lamotrigine Lvl 1.0 (L) 2.0 - 20.0 ug/mL    Comment: This test was developed and its performance characteristics determined by [facility]. It has not been cleared or approved by the Food and Drug Administration.                                 Detection Limit = 1.0   Lithium level     Status: None   Collection Time: 10/25/17  8:44 AM  Result Value Ref Range   Lithium Lvl 0.8 0.6 - 1.2 mmol/L    Comment:  Detection Limit = 0.1                           <0.1 indicates None Detected   ANA, IFA (with reflex)     Status: None   Collection Time: 10/25/17   8:44 AM  Result Value Ref Range   ANA Titer 1 Negative     Comment:                                      Negative   <1:80                                      Borderline  1:80                                      Positive   >1:80   Sedimentation rate     Status: None   Collection Time: 10/25/17  8:44 AM  Result Value Ref Range   Sed Rate 5 0 - 15 mm/hr  C-reactive protein     Status: None   Collection Time: 10/25/17  8:44 AM  Result Value Ref Range   CRP 1.0 0.0 - 4.9 mg/L  Rheumatoid Factor     Status: None   Collection Time: 10/25/17  8:44 AM  Result Value Ref Range   Rhuematoid fact SerPl-aCnc <10.0 0.0 - 13.9 IU/mL  Uric acid     Status: None   Collection Time: 10/25/17  8:44 AM  Result Value Ref Range   Uric Acid 4.4 3.7 - 8.6 mg/dL    Comment:            Therapeutic target for gout patients: <6.0  Testosterone     Status: None   Collection Time: 10/25/17  8:44 AM  Result Value Ref Range   Testosterone 743 264 - 916 ng/dL    Comment: Adult male reference interval is based on a population of healthy nonobese males (BMI <30) between 77 and 59 years old. [place], [place] [phone]. PMID: 91660600.   CYCLIC CITRUL PEPTIDE ANTIBODY, IGG/IGA     Status: None   Collection Time: 10/25/17  8:44 AM  Result Value Ref Range   Cyclic Citrullin Peptide Ab 4 0 - 19 units    Comment:                           Negative               <20                           Weak positive      20 - 39                           Moderate positive  40 - 59                           Strong positive        >59   Specimen status report     Status: None   Collection  Time: 10/25/17  8:44 AM  Result Value Ref Range   specimen status report Comment     Comment: Verbal Order See below: Comment: Please provide requested information and fax to [phone] or [phone]. The [place] Code of [employer] requires a written and signed request be forwarded to a laboratory  following a verbal order of a laboratory test.  Please assist [place] to meet this requirement and to complete our records. Date:______________________________ ICD-9/10 Diagnosis Code(s):___________________________________________ Physician or Authorized Designee:_____________________________________                                               Please Print Physician or Authorized Designee Signature: ______________________________________________________________________ Your Animal nutritionist Your Order Of The Test(s) Listed Additional Test(s) Requested Comment: Test(s) added per [place] at account 10-28-2017 Logged by Venancio Poisson Test# [phone] Hemoglobin A1c Verbal Order This is the second notice requesting this information. An  IMMEDIATE response is needed.   Hgb A1c w/o eAG     Status: None   Collection Time: 10/25/17  8:44 AM  Result Value Ref Range   Hgb A1c MFr Bld 5.2 4.8 - 5.6 %    Comment:          Prediabetes: 5.7 - 6.4          Diabetes: >6.4          Glycemic control for adults with diabetes: <7.0   Specimen status report     Status: None   Collection Time: 10/25/17  8:44 AM  Result Value Ref Range   specimen status report Comment     Comment: Written Authorization Written Authorization Written Authorization Received. Authorization received from [place] 11-07-2017 Logged by Lenice Llamas   Comprehensive metabolic panel     Status: Abnormal   Collection Time: 11/24/17 11:30 AM  Result Value Ref Range   Sodium 140 135 - 145 mmol/L   Potassium 3.8 3.5 - 5.1 mmol/L   Chloride 109 101 - 111 mmol/L   CO2 23 22 - 32 mmol/L   Glucose, Bld 144 (H) 65 - 99 mg/dL   BUN 15 6 - 20 mg/dL   Creatinine, Ser 1.04 0.61 - 1.24 mg/dL   Calcium 9.1 8.9 - 10.3 mg/dL   Total Protein 7.1 6.5 - 8.1 g/dL   Albumin 4.3 3.5 - 5.0 g/dL   AST 25 15 - 41 U/L   ALT 17 17 - 63 U/L   Alkaline Phosphatase 34 (L) 38 - 126 U/L   Total Bilirubin 0.7 0.3 - 1.2  mg/dL   GFR calc non Af Amer >60 >60 mL/min   GFR calc Af Amer >60 >60 mL/min    Comment: (NOTE) The eGFR has been calculated using the CKD EPI equation. This calculation has not been validated in all clinical situations. eGFR's persistently <60 mL/min signify possible Chronic Kidney Disease.    Anion gap 8 5 - 15    Comment: Performed at [facility], [address] [place], [place] [address]  CBC     Status: None   Collection Time: 11/24/17 11:30 AM  Result Value Ref Range   WBC 8.4 3.8 - 10.6 K/uL   RBC 4.61 4.40 - 5.90 MIL/uL   Hemoglobin 14.8 13.0 - 18.0 g/dL   HCT 44.1 40.0 - 52.0 %   MCV 95.6 80.0 - 100.0 fL  MCH 32.1 26.0 - 34.0 pg   MCHC 33.6 32.0 - 36.0 g/dL   RDW 13.1 11.5 - 14.5 %   Platelets 202 150 - 440 K/uL    Comment: Performed at [facility], [address] [place], [place] [address]  Influenza panel by PCR (type A & B)     Status: None   Collection Time: 11/24/17 11:35 AM  Result Value Ref Range   Influenza A By PCR NEGATIVE NEGATIVE   Influenza B By PCR NEGATIVE NEGATIVE    Comment: (NOTE) The Xpert Xpress Flu assay is intended as an aid in the diagnosis of  influenza and should not be used as a sole basis for treatment.  This  assay is FDA approved for nasopharyngeal swab specimens only. Nasal  washings and aspirates are unacceptable for Xpert Xpress Flu testing. Performed at [facility], [place], [place] [address]   Group A Strep by PCR [facility] Only)     Status: None   Collection Time: 11/24/17  2:25 PM  Result Value Ref Range   Group A Strep by PCR NOT DETECTED NOT DETECTED    Comment: Performed at [facility], [address] [place], [place] [address]   Objective  Body mass index is 30.23 kg/m. Wt Readings from Last 3 Encounters:  11/26/17 216 lb 12 oz (98.3 kg)  11/24/17 210 lb (95.3 kg)  10/24/17 218 lb 6 oz (99.1 kg)   Temp Readings from Last 3 Encounters:  11/26/17 98 F (36.7  C) (Oral)  11/24/17 98.6 F (37 C) (Oral)  10/24/17 98.1 F (36.7 C) (Oral)   BP Readings from Last 3 Encounters:  11/26/17 112/80  11/24/17 136/87  10/24/17 116/88   Pulse Readings from Last 3 Encounters:  11/26/17 67  11/24/17 62  10/24/17 60   O2 sat room air 97%   Physical Exam  Constitutional: He is oriented to person, place, and time and well-developed, well-nourished, and in no distress. Vital signs are normal.  HENT:  Head: Normocephalic and atraumatic.  Mouth/Throat: Oropharynx is clear and moist and mucous membranes are normal.  Eyes: Conjunctivae are normal. Pupils are equal, round, and reactive to light.  Neck: Normal range of motion.  Pain with ROM neck   Cardiovascular: Normal rate, regular rhythm and normal heart sounds.  Pulmonary/Chest: Effort normal and breath sounds normal.  Abdominal: Soft. Bowel sounds are normal. There is no tenderness.  Musculoskeletal:       Right shoulder: He exhibits tenderness.       Cervical back: He exhibits tenderness. He exhibits normal range of motion.  Neurological: He is alert and oriented to person, place, and time. Gait normal. Gait normal.  Skin: Skin is warm, dry and intact.  Psychiatric: Mood, memory, affect and judgment normal.  Nursing note and vitals reviewed.   Assessment   1. Blood in stool and epigastric pain, c/w anal fissure, hemorrhoid  2. H/o hypogonadism 3. Cervicalgia 4. Right shoulder pain  5. HM Plan  1.  Refer to Dr. Allen Norris may need EGD to check for GI ulcer  Stop pepcid  Start protonix 40 mg qam  2. Refilled clomid temp until f/u endocrine  Unclear indication to me why he is taking therefore feel more comfortable endocrine to manage  Reviewed previous notes endocrine in [place]   3. Xray neck today  Refer back to ortho  4. Xray shoulder today c/w avulsion injury refer back to Dr. Raliegh Ip orth[facility]  5.  Had flu shot  Tdap UTD Had Hep A/B vaccines consider check hep B titer in future    Provider: Dr. Olivia Mackie McLean-Scocuzza-Internal Medicine

## 2017-12-03 ENCOUNTER — Encounter: Payer: Self-pay | Admitting: Student in an Organized Health Care Education/Training Program

## 2017-12-03 ENCOUNTER — Ambulatory Visit
Payer: Medicare PPO | Attending: Student in an Organized Health Care Education/Training Program | Admitting: Student in an Organized Health Care Education/Training Program

## 2017-12-03 ENCOUNTER — Other Ambulatory Visit: Payer: Self-pay

## 2017-12-03 VITALS — BP 121/78 | HR 61 | Temp 98.3°F | Resp 16 | Ht 71.0 in | Wt 210.0 lb

## 2017-12-03 DIAGNOSIS — G8929 Other chronic pain: Secondary | ICD-10-CM | POA: Diagnosis present

## 2017-12-03 DIAGNOSIS — M542 Cervicalgia: Secondary | ICD-10-CM | POA: Diagnosis present

## 2017-12-03 DIAGNOSIS — M25511 Pain in right shoulder: Secondary | ICD-10-CM

## 2017-12-03 DIAGNOSIS — M5030 Other cervical disc degeneration, unspecified cervical region: Secondary | ICD-10-CM | POA: Insufficient documentation

## 2017-12-03 DIAGNOSIS — I129 Hypertensive chronic kidney disease with stage 1 through stage 4 chronic kidney disease, or unspecified chronic kidney disease: Secondary | ICD-10-CM | POA: Diagnosis not present

## 2017-12-03 DIAGNOSIS — M79641 Pain in right hand: Secondary | ICD-10-CM | POA: Insufficient documentation

## 2017-12-03 DIAGNOSIS — F319 Bipolar disorder, unspecified: Secondary | ICD-10-CM | POA: Diagnosis not present

## 2017-12-03 DIAGNOSIS — M25552 Pain in left hip: Secondary | ICD-10-CM | POA: Diagnosis not present

## 2017-12-03 DIAGNOSIS — M5442 Lumbago with sciatica, left side: Secondary | ICD-10-CM | POA: Diagnosis not present

## 2017-12-03 DIAGNOSIS — N189 Chronic kidney disease, unspecified: Secondary | ICD-10-CM | POA: Diagnosis not present

## 2017-12-03 DIAGNOSIS — M25561 Pain in right knee: Secondary | ICD-10-CM | POA: Insufficient documentation

## 2017-12-03 DIAGNOSIS — M25551 Pain in right hip: Secondary | ICD-10-CM | POA: Insufficient documentation

## 2017-12-03 DIAGNOSIS — Z87891 Personal history of nicotine dependence: Secondary | ICD-10-CM | POA: Diagnosis not present

## 2017-12-03 DIAGNOSIS — N529 Male erectile dysfunction, unspecified: Secondary | ICD-10-CM | POA: Diagnosis not present

## 2017-12-03 DIAGNOSIS — M5441 Lumbago with sciatica, right side: Secondary | ICD-10-CM | POA: Diagnosis present

## 2017-12-03 DIAGNOSIS — Z87442 Personal history of urinary calculi: Secondary | ICD-10-CM | POA: Insufficient documentation

## 2017-12-03 DIAGNOSIS — G894 Chronic pain syndrome: Secondary | ICD-10-CM

## 2017-12-03 DIAGNOSIS — Z79899 Other long term (current) drug therapy: Secondary | ICD-10-CM | POA: Insufficient documentation

## 2017-12-03 DIAGNOSIS — R1013 Epigastric pain: Secondary | ICD-10-CM | POA: Diagnosis not present

## 2017-12-03 NOTE — Progress Notes (Signed)
Safety precautions to be maintained throughout the outpatient stay will include: orient to surroundings, keep bed in low position, maintain call bell within reach at all times, provide assistance with transfer out of bed and ambulation.  

## 2017-12-03 NOTE — Patient Instructions (Signed)
Follow up after cervical MRI

## 2017-12-03 NOTE — Progress Notes (Signed)
Patient's Name: Aqil Goetting  MRN: [id number]  Referring Provider: Diamond Nickel, MD  DOB: 02-Feb-1976  PCP: McLean-Scocuzza, Nino Glow, MD  DOS: 12/03/2017  Note by: Gillis Santa, MD  Service setting: Ambulatory outpatient  Specialty: Interventional Pain Management  Location: [facility] (AMB) Pain Management Facility  Visit type: Initial Patient Evaluation  Patient type: New Patient   Primary Reason(s) for Visit: Encounter for initial evaluation of one or more chronic problems (new to examiner) potentially causing chronic pain, and posing a threat to normal musculoskeletal function. (Level of risk: High) CC: Neck Pain (through right arm to right hand); Hip Pain (bilaterally); and Leg Pain (right, to top of right foot)  HPI  Mr. Witherington is a 42 y.o. year old, male patient, who comes today to see [place] for the first time for an initial evaluation of his chronic pain. He has Back pain; Right hip pain; Knee pain, right; Hypogonadism in male; Low testosterone; Erectile dysfunction; Bipolar 1 disorder ([place]); Epigastric pain; Anal fissure; Blood in stool; Hemorrhoids; Cervicalgia; and Chronic right shoulder pain on their problem list. Today he comes in for evaluation of his Neck Pain (through right arm to right hand); Hip Pain (bilaterally); and Leg Pain (right, to top of right foot)  Pain Assessment: Location: Posterior Neck Radiating: through right shoulder down right arm to hand Onset: More than a month ago Duration: Chronic pain Quality: Aching, Constant, Shooting, Burning Severity: 8 /10 (self-reported pain score)  Note: Reported level is inconsistent with clinical observations. Clinically the patient looks like a 1/10 A 1/10 is viewed as "Mild" and described as nagging, annoying, but not interfering with basic activities of daily living (ADL). Mr. Frick is able to eat, bathe, get dressed, do toileting (being able to get on and off the toilet and perform personal hygiene functions), transfer (move in  and out of bed or a chair without assistance), and maintain continence (able to control bladder and bowel functions). Physiologic parameters such as blood pressure and heart rate apear wnl.       When using our objective Pain Scale, levels between 6 and 10/10 are said to belong in an emergency room, as it progressively worsens from a 6/10, described as severely limiting, requiring emergency care not usually available at an outpatient pain management facility. At a 6/10 level, communication becomes difficult and requires great effort. Assistance to reach the emergency department may be required. Facial flushing and profuse sweating along with potentially dangerous increases in heart rate and blood pressure will be evident. Effect on ADL: cannot sit in one spot for any period of time, cannot sit long enought to watch a movie Timing: Constant Modifying factors: Tylenol Arthritis, treatments through pain clinic in [place]  Onset and Duration: Present longer than 3 months Cause of pain: football injury Severity: Getting worse, NAS-11 at its worse: 9/10, NAS-11 at its best: 6/10, NAS-11 now: 7/10 and NAS-11 on the average: 7/10 Timing: Morning, During activity or exercise, After activity or exercise and After a period of immobility Aggravating Factors: Bending, Kneeling, Lifiting, Prolonged sitting and Squatting Alleviating Factors: Hot packs, Medications, Nerve blocks and Warm showers or baths Associated Problems: Depression, Inability to concentrate, Sadness, Spasms, Tingling, Weakness and Pain that wakes patient up Quality of Pain: Burning, Intermittent, Cruel, Deep, Hot, Pulsating, Sharp, Shooting, Tender, Throbbing and Tiring Previous Examinations or Tests: CT scan, Nerve block, Nerve conduction test, Neurological evaluation, Orthopedic evaluation and Psychiatric evaluation Previous Treatments: Epidural steroid injections, Facet blocks, Narcotic medications, Physical Therapy, Pool exercises,  Relaxation  therapy, Steroid treatments by mouth, Stretching exercises and Trigger point injections  The patient comes into the clinics today for the first time for a chronic pain management evaluation.   42 year old male with a history of addiction in remission since 2013 who presents with low back pain that radiates into his right hip and right groin.  Patient also notes right knee pain.  His pain has worsened over the last 8 months.  Of note patient played football and sustained a right shoulder injury while playing in high school.  This is resulted in persistent pain along his right shoulder which is worsened over the last couple of months with radiating pain down into his right forearm and right hand most pronounced at his thumb and index finger.  Patient had an intra-articular right hip injection a couple of weeks ago for his right groin pain and right hip pain.  Patient has had cervical facet medial branch nerve blocks in [place] over 4 years ago which were effective for his neck pain.  Of note patient also sustained an injury of his low back and right hip while training for a triathlon in [place] in 2016.  Patient is tried various medications including narcotics, Tylenol, various anti-inflammatories including Mobic, Voltaren, naproxen; membrane stabilizers including gabapentin and muscle relaxants including Flexeril and baclofen which were not effective.  Patient notes difficulty with fine motor control most pronounced in his right hand.  Currently not on any opioid therapy.  Is only taking Tylenol.  Was prescribed meloxicam which resulted in GI upset and ulcers.  Of note patient does have a history of alcohol and cocaine abuse/addiction.  He has been in remission since 2013.  Today I took the time to provide the patient with information regarding my pain practice. The patient was informed that my practice is divided into two sections: an interventional pain management section, as well as a completely separate  and distinct medication management section. I explained that I have procedure days for my interventional therapies, and evaluation days for follow-ups and medication management. Because of the amount of documentation required during both, they are kept separated. This means that there is the possibility that he may be scheduled for a procedure on one day, and medication management the next. I have also informed him that because of staffing and facility limitations, I no longer take patients for medication management only. To illustrate the reasons for this, I gave the patient the example of surgeons, and how inappropriate it would be to refer a patient to his/her care, just to write for the post-surgical antibiotics on a surgery done by a different surgeon.   Because interventional pain management is my board-certified specialty, the patient was informed that joining my practice means that they are open to any and all interventional therapies. I made it clear that this does not mean that they will be forced to have any procedures done. What this means is that I believe interventional therapies to be essential part of the diagnosis and proper management of chronic pain conditions. Therefore, patients not interested in these interventional alternatives will be better served under the care of a different practitioner.  The patient was also made aware of my Comprehensive Pain Management Safety Guidelines where by joining my practice, they limit all of their nerve blocks and joint injections to those done by our practice, for as long as we are retained to manage their care.   Historic Controlled Substance Pharmacotherapy Review    [place]  PMP: Six (6) year initial data search conducted.               Meds   Current Outpatient Medications:  .  acetaminophen (TYLENOL) 500 MG tablet, Take 500 mg by mouth every 6 (six) hours as needed., Disp: , Rfl:  .  buPROPion (WELLBUTRIN XL) 300 MG 24 hr tablet, Take 300 mg by  mouth daily. , Disp: , Rfl:  .  busPIRone (BUSPAR) 30 MG tablet, 2 (two) times daily. , Disp: , Rfl:  .  clomiPHENE (CLOMID) 50 MG tablet, Take 1 tablet (50 mg total) by mouth every other day., Disp: 60 tablet, Rfl: 0 .  fluticasone (FLONASE) 50 MCG/ACT nasal spray, PLACE 2 SPRAYS INTO EACH NOSTRIL QD, Disp: , Rfl:  .  lamoTRIgine (LAMICTAL) 200 MG tablet, Take by mouth daily. , Disp: , Rfl:  .  lithium carbonate 300 MG capsule, Take 300 mg by mouth daily. , Disp: , Rfl:  .  Melatonin 5 MG CAPS, , Disp: , Rfl:  .  Multiple Vitamins-Minerals (MULTIVITAMIN MEN) TABS, Take 1 tablet by mouth daily., Disp: , Rfl:  .  pantoprazole (PROTONIX) 40 MG tablet, Take 1 tablet (40 mg total) by mouth daily. 30 minutes before food, Disp: 90 tablet, Rfl: 1 .  sildenafil (REVATIO) 20 MG tablet, Take 40 mg by mouth., Disp: , Rfl:  .  SUMAtriptan (IMITREX) 100 MG tablet, , Disp: , Rfl:  .  traZODone (DESYREL) 100 MG tablet, Take 200 mg by mouth at bedtime., Disp: , Rfl:  .  albuterol (PROVENTIL HFA) 108 (90 Base) MCG/ACT inhaler, Inhale into the lungs., Disp: , Rfl:  .  hydrocortisone (ANUSOL-HC) 2.5 % rectal cream, Apply rectally 2 times daily (Patient not taking: Reported on 12/03/2017), Disp: 30 g, Rfl: 1  Imaging Review   Cervical DG complete:  Results for orders placed in visit on 11/26/17  DG Cervical Spine Complete   Narrative CLINICAL DATA:  Neck and right shoulder pain, no acute injury  EXAM: CERVICAL SPINE - COMPLETE 4+ VIEW  COMPARISON:  None.  FINDINGS: The cervical vertebrae are in normal alignment. Intervertebral disc spaces appear normal. Minimal anterior osteophyte formation is present at C4-5 and C5-6 levels. No prevertebral soft tissue swelling is seen. On oblique views the foramina are widely patent. The odontoid process is intact. The lung apices are clear.  IMPRESSION: Normal alignment with normal intervertebral disc spaces. Minimal anterior osteophyte formation at C4-5 and  C5-6.   Electronically Signed   By: Ivar Drape M.D.   On: 11/26/2017 10:59    Shoulder-R DG:  Results for orders placed in visit on 11/26/17  DG Shoulder Right   Narrative CLINICAL DATA:  Neck and right shoulder pain, no acute injury  EXAM: RIGHT SHOULDER - 2+ VIEW  COMPARISON:  Chest x-ray of 05/11/2007  FINDINGS: The right humeral head is in normal position and the glenohumeral joint space appears normal. There is a small bony density from the inferior glenoid rim which could be due to prior trauma and represent a small avulsion. The right [facility] joint appears normally aligned on the images obtained.  IMPRESSION: Question small avulsion fragment from the inferior glenoid rim. The right shoulder joint space appears normal.   Electronically Signed   By: Ivar Drape M.D.   On: 11/26/2017 11:00     Results for orders placed in visit on 10/24/17  DG HIP UNILAT WITH PELVIS 2-3 VIEWS RIGHT   Narrative CLINICAL DATA:  Chronic RIGHT hip and  knee pain for several years, no known injury or surgery  EXAM: DG HIP (WITH OR WITHOUT PELVIS) 2-3V RIGHT  COMPARISON:  None  FINDINGS: Osseous mineralization normal.  Hip and SI joint spaces preserved.  No acute fracture, dislocation, or bone destruction.  Small old non fused ossicle adjacent to the lateral margin of the acetabulum.  IMPRESSION: No acute osseous abnormalities.   Electronically Signed   By: Lavonia Dana M.D.   On: 10/25/2017 08:06    t. Knee-R DG 4 views:  Results for orders placed in visit on 10/24/17  DG Knee Complete 4 Views Right   Narrative CLINICAL DATA:  Chronic RIGHT hip and knee pain for several years, no known injury or surgery  EXAM: RIGHT KNEE - COMPLETE 4+ VIEW  COMPARISON:  None  FINDINGS: Osseous mineralization normal.  Joint spaces preserved.  No fracture, dislocation, or bone destruction.  No joint effusion.  IMPRESSION: Normal exam.   Electronically Signed   By: Lavonia Dana M.D.   On: 10/25/2017 08:07      Complexity Note: Imaging results reviewed. Results shared with Mr. Wuthrich, using Layman's terms.                         ROS  Cardiovascular History: High blood pressure Pulmonary or Respiratory History: No reported pulmonary signs or symptoms such as wheezing and difficulty taking a deep full breath (Asthma), difficulty blowing air out (Emphysema), coughing up mucus (Bronchitis), persistent dry cough, or temporary stoppage of breathing during sleep Neurological History: No reported neurological signs or symptoms such as seizures, abnormal skin sensations, urinary and/or fecal incontinence, being born with an abnormal open spine and/or a tethered spinal cord Review of Past Neurological Studies: No results found for this or any previous visit. Psychological-Psychiatric History: Psychiatric disorder, Anxiousness, Depressed and Difficulty sleeping and or falling asleep Gastrointestinal History: Vomiting blood (Ulcers) Genitourinary History: No reported renal or genitourinary signs or symptoms such as difficulty voiding or producing urine, peeing blood, non-functioning kidney, kidney stones, difficulty emptying the bladder, difficulty controlling the flow of urine, or chronic kidney disease Hematological History: No reported hematological signs or symptoms such as prolonged bleeding, low or poor functioning platelets, bruising or bleeding easily, hereditary bleeding problems, low energy levels due to low hemoglobin or being anemic Endocrine History: No reported endocrine signs or symptoms such as high or low blood sugar, rapid heart rate due to high thyroid levels, obesity or weight gain due to slow thyroid or thyroid disease Rheumatologic History: No reported rheumatological signs and symptoms such as fatigue, joint pain, tenderness, swelling, redness, heat, stiffness, decreased range of motion, with or without associated rash Musculoskeletal History:  Negative for myasthenia gravis, muscular dystrophy, multiple sclerosis or malignant hyperthermia Work History: Disabled  Allergies  Mr. Sondgeroth has No Known Allergies.  Laboratory Chemistry  Inflammation Markers (CRP: Acute Phase) (ESR: Chronic Phase) Lab Results  Component Value Date   CRP 1.0 10/25/2017   ESRSEDRATE 5 10/25/2017                         Rheumatology Markers Lab Results  Component Value Date   RF <10.0 10/25/2017   LABURIC 4.4 10/25/2017                Renal Function Markers Lab Results  Component Value Date   BUN 15 11/24/2017   CREATININE 1.04 11/24/2017   GFRAA >60 11/24/2017   GFRNONAA >60 11/24/2017  Hepatic Function Markers Lab Results  Component Value Date   AST 25 11/24/2017   ALT 17 11/24/2017   ALBUMIN 4.3 11/24/2017   ALKPHOS 34 (L) 11/24/2017                 Electrolytes Lab Results  Component Value Date   NA 140 11/24/2017   K 3.8 11/24/2017   CL 109 11/24/2017   CALCIUM 9.1 11/24/2017                        Neuropathy Markers Lab Results  Component Value Date   HGBA1C 5.2 10/25/2017                 Bone Pathology Markers Lab Results  Component Value Date   TESTOFREE 20.5 10/25/2017   TESTOSTERONE 743 10/25/2017                         Coagulation Parameters Lab Results  Component Value Date   PLT 202 11/24/2017                 Cardiovascular Markers Lab Results  Component Value Date   HGB 14.8 11/24/2017   HCT 44.1 11/24/2017                 CA Markers No results found for: CEA, CA125, LABCA2               Note: Lab results reviewed.  PFSH  Drug: Mr. Jurgens  reports that he does not use drugs. Alcohol:  reports that he does not drink alcohol. Tobacco:  reports that he has quit smoking. His smoking use included cigarettes. he has never used smokeless tobacco. Medical:  has a past medical history of Bipolar disorder ([place]), Blood in stool, Depression, Drug abuse in remission, H/O alcohol  abuse, Hypertension, and Low testosterone in male. Family: family history includes Alcohol abuse in his father; COPD in his father; Cancer in his father; Depression in his mother; Hyperlipidemia in his father; Hypertension in his father.  Past Surgical History:  Procedure Laterality Date  . APPENDECTOMY     2006   Active Ambulatory Problems    Diagnosis Date Noted  . Back pain 10/29/2017  . Right hip pain 10/29/2017  . Knee pain, right 10/29/2017  . Hypogonadism in male 10/29/2017  . Low testosterone 10/29/2017  . Erectile dysfunction 10/29/2017  . Bipolar 1 disorder ([facility]) 10/29/2017  . Epigastric pain 11/28/2017  . Anal fissure 11/28/2017  . Blood in stool 11/28/2017  . Hemorrhoids 11/28/2017  . Cervicalgia 11/28/2017  . Chronic right shoulder pain 11/28/2017   Resolved Ambulatory Problems    Diagnosis Date Noted  . No Resolved Ambulatory Problems   Past Medical History:  Diagnosis Date  . Bipolar disorder ([place])   . Blood in stool   . Depression   . Drug abuse in remission   . H/O alcohol abuse   . Hypertension   . Low testosterone in male    Constitutional Exam  General appearance: Well nourished, well developed, and well hydrated. In no apparent acute distress Vitals:   12/03/17 1005  BP: 121/78  Pulse: 61  Resp: 16  Temp: 98.3 F (36.8 C)  TempSrc: Oral  SpO2: 99%  Weight: 210 lb (95.3 kg)  Height: _0  (1.803 m)   BMI Assessment: Estimated body mass index is 29.29 kg/m as calculated from the following:   Height as of this  encounter: _0  (1.803 m).   Weight as of this encounter: 210 lb (95.3 kg).  BMI interpretation table: BMI level Category Range association with higher incidence of chronic pain  <18 kg/m2 Underweight   18.5-24.9 kg/m2 Ideal body weight   25-29.9 kg/m2 Overweight Increased incidence by 20%  30-34.9 kg/m2 Obese (Class I) Increased incidence by 68%  35-39.9 kg/m2 Severe obesity (Class II) Increased incidence by 136%  >40 kg/m2  Extreme obesity (Class III) Increased incidence by 254%   BMI Readings from Last 4 Encounters:  12/03/17 29.29 kg/m  11/26/17 30.23 kg/m  11/24/17 29.29 kg/m  10/24/17 30.46 kg/m   Wt Readings from Last 4 Encounters:  12/03/17 210 lb (95.3 kg)  11/26/17 216 lb 12 oz (98.3 kg)  11/24/17 210 lb (95.3 kg)  10/24/17 218 lb 6 oz (99.1 kg)  Psych/Mental status: Alert, oriented x 3 (person, place, & time)       Eyes: PERLA Respiratory: No evidence of acute respiratory distress  Cervical Spine Area Exam  Skin & Axial Inspection: No masses, redness, edema, swelling, or associated skin lesions Alignment: Symmetrical Functional ROM: Decreased ROM, to the right Stability: No instability detected Muscle Tone/Strength: Functionally intact. No obvious neuro-muscular anomalies detected. Sensory (Neurological): Dermatomal pain pattern Palpation: Complains of area being tender to palpation Positive provocative maneuver for for cervical facet disease 5 out of 5 strength bilateral upper extremity: Shoulder abduction, elbow flexion, elbow extension, thumb extension.  Upper Extremity (UE) Exam    Side: Right upper extremity  Side: Left upper extremity  Skin & Extremity Inspection: Skin color, temperature, and hair growth are WNL. No peripheral edema or cyanosis. No masses, redness, swelling, asymmetry, or associated skin lesions. No contractures.  Skin & Extremity Inspection: Skin color, temperature, and hair growth are WNL. No peripheral edema or cyanosis. No masses, redness, swelling, asymmetry, or associated skin lesions. No contractures.  Functional ROM: Unrestricted ROM          Functional ROM: Unrestricted ROM          Muscle Tone/Strength: Functionally intact. No obvious neuro-muscular anomalies detected.  Muscle Tone/Strength: Functionally intact. No obvious neuro-muscular anomalies detected.  Sensory (Neurological): Unimpaired          Sensory (Neurological): Unimpaired          Palpation: No  palpable anomalies              Palpation: No palpable anomalies              Specialized Test(s): Deferred         Specialized Test(s): Deferred          Thoracic Spine Area Exam  Skin & Axial Inspection: No masses, redness, or swelling Alignment: Symmetrical Functional ROM: Unrestricted ROM Stability: No instability detected Muscle Tone/Strength: Functionally intact. No obvious neuro-muscular anomalies detected. Sensory (Neurological): Unimpaired Muscle strength & Tone: No palpable anomalies  Lumbar Spine Area Exam  Skin & Axial Inspection: No masses, redness, or swelling Alignment: Symmetrical Functional ROM: Unrestricted ROM      Stability: No instability detected Muscle Tone/Strength: Functionally intact. No obvious neuro-muscular anomalies detected. Sensory (Neurological): Unimpaired Palpation: No palpable anomalies       Provocative Tests: Lumbar Hyperextension and rotation test: Positive bilaterally for facet joint pain. Lumbar Lateral bending test: Positive due to pain. Patrick's Maneuver: Positive for bilateral S-I arthralgia              Gait & Posture Assessment  Ambulation: Unassisted Gait: Relatively normal for age  and body habitus Posture: WNL   Lower Extremity Exam    Side: Right lower extremity  Side: Left lower extremity  Skin & Extremity Inspection: Skin color, temperature, and hair growth are WNL. No peripheral edema or cyanosis. No masses, redness, swelling, asymmetry, or associated skin lesions. No contractures.  Skin & Extremity Inspection: Skin color, temperature, and hair growth are WNL. No peripheral edema or cyanosis. No masses, redness, swelling, asymmetry, or associated skin lesions. No contractures.  Functional ROM: Unrestricted ROM          Functional ROM: Unrestricted ROM          Muscle Tone/Strength: Functionally intact. No obvious neuro-muscular anomalies detected.  Muscle Tone/Strength: Functionally intact. No obvious neuro-muscular anomalies  detected.  Sensory (Neurological): Unimpaired  Sensory (Neurological): Unimpaired  Palpation: No palpable anomalies  Palpation: No palpable anomalies   Assessment  Primary Diagnosis & Pertinent Problem List: The primary encounter diagnosis was DDD (degenerative disc disease), cervical. Diagnoses of Cervicalgia, Chronic right shoulder pain, Chronic bilateral low back pain with bilateral sciatica, and Chronic pain syndrome were also pertinent to this visit.  Visit Diagnosis (New problems to examiner): 1. DDD (degenerative disc disease), cervical   2. Cervicalgia   3. Chronic right shoulder pain   4. Chronic bilateral low back pain with bilateral sciatica   5. Chronic pain syndrome   General Recommendations: The pain condition that the patient suffers from is best treated with a multidisciplinary approach that involves an increase in physical activity to prevent de-conditioning and worsening of the pain cycle, as well as psychological counseling (formal and/or informal) to address the co-morbid psychological affects of pain. Treatment will often involve judicious use of pain medications and interventional procedures to decrease the pain, allowing the patient to participate in the physical activity that will ultimately produce long-lasting pain reductions. The goal of the multidisciplinary approach is to return the patient to a higher level of overall function and to restore their ability to perform activities of daily living.  42 year old male who presents with a chief complaint of neck pain that radiates to his right shoulder and down his right arm along with axial low back pain as well as right hip pain.  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  This was in 2016.  Patient moved from [place] approximately 3-1/2 years ago.  Patient states that he  has had cervical facet medial branch nerve blocks in [place] which were effective for his neck pain.  He has not had any cervical radiofrequency ablations performed.  Patient has not had any lumbar interventions performed.  Patient has had trigger point injections performed of his neck and trapezius region which were helpful.  He is status post a right intra-articular hip injection approximately 2 weeks ago.  He finds this effective but he states that the results are wearing off.  Patient has tried various medication trials as detailed above and has not found any of them effective.  We will focus on interventional pain management.  To further delineate a therapeutic plan given patient's x-ray imaging of his neck and lumbar spine were largely normal, we will start with a cervical MRI to look for cervical radiculopathy that could be contributing to his right upper extremity symptoms and subjective weakness.  Plan: -Not a candidate for opioid therapy given hx of addiction (ETOH, cocaine,  clean since 2013). -Cervical MRI without contrast to evaluate for cervical foraminal stenosis, cervical degenerative disc disease that could be contributing to his right upper extremity radicular symptoms.  -We will focus on interventional management which may include cervical medial branch nerve blocks, cervical radiofrequency ablation, lumbar facet medial branch nerve blocks, lumbar facet radio frequency ablation, SI joint blocks.  Ordered Lab-work, Procedure(s), Referral(s), & Consult(s): Orders Placed This Encounter  Procedures  . MR CERVICAL SPINE WO CONTRAST   Pharmacotherapy (current): Medications ordered:  No orders of the defined types were placed in this encounter.  Medications administered during this visit: [name] had no medications administered during this visit.   Pharmacological management options:  Opioid Analgesics: The patient was informed that there is no guarantee that he would be a  candidate for opioid analgesics. The decision will be made following CDC guidelines. This decision will be based on the results of diagnostic studies, as well as Mr. Sleight risk profile.   Membrane stabilizer: Tried and failed  Muscle relaxant: Tried and failed  NSAID: Tried and failed  Other analgesic(s): To be determined at a later time   Interventional management options: Mr. Durio was informed that there is no guarantee that he would be a candidate for interventional therapies. The decision will be based on the results of diagnostic studies, as well as Mr. Balthazor risk profile.  Procedure(s) under consideration:  cervical medial branch nerve blocks,  cervical radiofrequency ablation,  lumbar facet medial branch nerve blocks,  lumbar facet radio frequency ablation,  SI joint blocks.    Provider-requested follow-up: Return in about 3 weeks (around 12/24/2017) for After Imaging.  Future Appointments  Date Time Provider Lindsay  12/17/2017  8:45 AM Marius Ditch, Tally Due, MD [facility] None    Primary Care Physician: McLean-Scocuzza, Nino Glow, MD Location: [facility] Note by: Gillis Santa, M.D, Date: 12/03/2017; Time: 11:04 AM  Patient Instructions  Follow up after cervical MRI

## 2017-12-06 ENCOUNTER — Telehealth: Payer: Self-pay | Admitting: *Deleted

## 2017-12-10 ENCOUNTER — Ambulatory Visit: Payer: Medicare PPO | Admitting: Family Medicine

## 2017-12-10 ENCOUNTER — Encounter: Payer: Self-pay | Admitting: Family Medicine

## 2017-12-10 VITALS — BP 148/92 | HR 80 | Temp 98.6°F | Wt 217.8 lb

## 2017-12-10 DIAGNOSIS — K649 Unspecified hemorrhoids: Secondary | ICD-10-CM

## 2017-12-10 DIAGNOSIS — K602 Anal fissure, unspecified: Secondary | ICD-10-CM | POA: Diagnosis not present

## 2017-12-10 NOTE — Patient Instructions (Addendum)
Please continue anusol for symptoms with sitz baths. Also, please continue fiber and increased water intake. A referral for GI has been placed as requested   Hemorrhoids Hemorrhoids are swollen veins in and around the rectum or anus. Hemorrhoids can cause pain, itching, or bleeding. Most of the time, they do not cause serious problems. They usually get better with diet changes, lifestyle changes, and other home treatments. Follow these instructions at home: Eating and drinking  Eat foods that have fiber, such as whole grains, beans, nuts, fruits, and vegetables. Ask your doctor about taking products that have added fiber (fibersupplements).  Drink enough fluid to keep your pee (urine) clear or pale yellow. For Pain and Swelling  Take a warm-water bath (sitz bath) for 20 minutes to ease pain. Do this 3-4 times a day.  If directed, put ice on the painful area. It may be helpful to use ice between your warm baths. ? Put ice in a plastic bag. ? Place a towel between your skin and the bag. ? Leave the ice on for 20 minutes, 2-3 times a day. General instructions  Take over-the-counter and prescription medicines only as told by your doctor. ? Medicated creams and medicines that are inserted into the anus (suppositories) may be used or applied as told.  Exercise often.  Go to the bathroom when you have the urge to poop (to have a bowel movement). Do not wait.  Avoid pushing too hard (straining) when you poop.  Keep the butt area dry and clean. Use wet toilet paper or moist paper towels.  Do not sit on the toilet for a long time. Contact a doctor if:  You have any of these: ? Pain and swelling that do not get better with treatment or medicine. ? Bleeding that will not stop. ? Trouble pooping or you cannot poop. ? Pain or swelling outside the area of the hemorrhoids. This information is not intended to replace advice given to you by your health care provider. Make sure you discuss any  questions you have with your health care provider. Document Released: 06/26/2008 Document Revised: 02/23/2016 Document Reviewed: 06/01/2015 Elsevier Interactive Patient Education  [employer].

## 2017-12-10 NOTE — Progress Notes (Signed)
Subjective:    Patient ID: Johnny Villa, male    DOB: 17-Jun-1976, 42 y.o.   MRN: [id number]  HPI  Johnny Villa is a 42 year old male who presents today with a hemorrhoid flare up. Associated pain with sitting has been present. He describes this as causing "constant pain" and would like "surgical" treatment. This episode has been present for the past 3 to 4 days.   He was evaluated on 11/24/17 at the ED for pain with defecation and blood in stool. He was found to have an anal fissure and external hemorrhoid. He was also advised to stop celebrex, start H2 blocker and follow up with GI as his long term NSAID use may be contributing to bloody stools. Referral has been placed previously.   He denies fever, chills, sweats, N/V/D, and constipation today. He reports a long history of constipation and prior hemorrhoid treatment that was treated surgically and provided excellent benefit.  Associated intermittent bleeding occurs with bowel movements. Treatment at home with sitz baths and anusol have provided moderate benefit. He reports sitz baths once or twice daily.  He reports that pain worsened over the past 3 to 4 days.  He reports history of constipation but this has improved with colace and fiber intake.  Fiber in diet: Increased with fiber tablets Water intake: Increased water intake. He has an appointment with GI on 12/17/17 to check for GI ulcer Aggravating factor: pain with sitting/pressure Relieving factor: sitz baths He reports that Anusol is expensive and he tries to use this sparingly.   Review of Systems  Constitutional: Negative for chills, fatigue and fever.  Respiratory: Negative for cough, shortness of breath and wheezing.   Cardiovascular: Negative for chest pain and palpitations.  Gastrointestinal: Positive for blood in stool. Negative for abdominal pain, diarrhea, nausea and vomiting.  Genitourinary: Negative for dysuria and hematuria.  Skin: Negative for rash.    Neurological: Negative for dizziness and light-headedness.   Past Medical History:  Diagnosis Date  . Bipolar disorder [facility])    psychiatrist in [place]  . Blood in stool   . Depression   . Drug abuse in remission    sober for 6 years  . H/O alcohol abuse   . Hypertension   . Low testosterone in male      Social History   Socioeconomic History  . Marital status: Single    Spouse name: Not on file  . Number of children: Not on file  . Years of education: Not on file  . Highest education level: Not on file  Social Needs  . Financial resource strain: Not on file  . Food insecurity - worry: Not on file  . Food insecurity - inability: Not on file  . Transportation needs - medical: Not on file  . Transportation needs - non-medical: Not on file  Occupational History  . Occupation: disabled  Tobacco Use  . Smoking status: Former Smoker    Types: Cigarettes  . Smokeless tobacco: Never Used  Substance and Sexual Activity  . Alcohol use: No    Frequency: Never    Comment: former quit 2013  . Drug use: No    Comment: former quit cocaine 2013   . Sexual activity: Yes  Other Topics Concern  . Not on file  Social History Narrative   No kids girlfriend has 3 kids    MBA   Disabled     Past Surgical History:  Procedure Laterality Date  . APPENDECTOMY  2006    Family History  Problem Relation Age of Onset  . Depression Mother   . Alcohol abuse Father   . Cancer Father        prostate dx'ed 14  . COPD Father   . Hyperlipidemia Father   . Hypertension Father     No Known Allergies  Current Outpatient Medications on File Prior to Visit  Medication Sig Dispense Refill  . acetaminophen (TYLENOL) 500 MG tablet Take 500 mg by mouth every 6 (six) hours as needed.    Marland Kitchen albuterol (PROVENTIL HFA) 108 (90 Base) MCG/ACT inhaler Inhale into the lungs.    Marland Kitchen buPROPion (WELLBUTRIN XL) 300 MG 24 hr tablet Take 300 mg by mouth daily.     . busPIRone (BUSPAR) 30 MG tablet 2  (two) times daily.     . clomiPHENE (CLOMID) 50 MG tablet Take 1 tablet (50 mg total) by mouth every other day. 60 tablet 0  . fluticasone (FLONASE) 50 MCG/ACT nasal spray PLACE 2 SPRAYS INTO EACH NOSTRIL QD    . hydrocortisone (ANUSOL-HC) 2.5 % rectal cream Apply rectally 2 times daily 30 g 1  . lamoTRIgine (LAMICTAL) 200 MG tablet Take by mouth daily.     Marland Kitchen lithium carbonate 300 MG capsule Take 300 mg by mouth daily.     . Melatonin 5 MG CAPS     . Multiple Vitamins-Minerals (MULTIVITAMIN MEN) TABS Take 1 tablet by mouth daily.    . pantoprazole (PROTONIX) 40 MG tablet Take 1 tablet (40 mg total) by mouth daily. 30 minutes before food 90 tablet 1  . sildenafil (REVATIO) 20 MG tablet Take 40 mg by mouth.    . SUMAtriptan (IMITREX) 100 MG tablet     . traZODone (DESYREL) 100 MG tablet Take 200 mg by mouth at bedtime.     No current facility-administered medications on file prior to visit.     BP (!) 148/92 (BP Location: Right Arm, Patient Position: Sitting, Cuff Size: Normal)   Pulse 80   Temp 98.6 F (37 C) (Oral)   Wt 217 lb 12.8 oz (98.8 kg)   BMI 30.38 kg/m       Objective:   Physical Exam  Constitutional: He is oriented to person, place, and time. He appears well-developed and well-nourished.  Neck: Neck supple.  Cardiovascular: Normal rate, regular rhythm and intact distal pulses.  Pulmonary/Chest: Effort normal and breath sounds normal.  Abdominal: Soft. Bowel sounds are normal. There is no tenderness. There is no rebound.  Genitourinary:  Genitourinary Comments: Hemorrhoid present; no active bleeding noted; anal fissure visualized.   Neurological: He is alert and oriented to person, place, and time.  Skin: Skin is warm and dry. No rash noted.  Psychiatric: He has a normal mood and affect. His behavior is normal. Judgment and thought content normal.      Assessment & Plan:  1. Hemorrhoids, unspecified hemorrhoid type Continue Anusol and sitz baths; referral to GI  placed for further evaluation and treatment of hemorrhoid due to reoccurring flares and pain. We discussed that office based procedures as well as surgical treatments discussed and offered by GI can be considered for hemorrhoids that do not respond to conservative therapy. Referral placed today. Further advised avoidance of NSAIDs and increase water and fiber intake in diet.  Return precautions provided.  - Ambulatory referral to Gastroenterology  2. Anal fissure  - Ambulatory referral to Gastroenterology  Delano Metz, FNP-C

## 2017-12-17 ENCOUNTER — Ambulatory Visit: Payer: Medicare PPO | Admitting: Gastroenterology

## 2017-12-17 ENCOUNTER — Telehealth: Payer: Self-pay

## 2017-12-17 ENCOUNTER — Ambulatory Visit: Payer: Medicare PPO

## 2017-12-17 ENCOUNTER — Encounter: Payer: Self-pay | Admitting: Gastroenterology

## 2017-12-17 VITALS — BP 106/68 | HR 59 | Temp 98.5°F | Ht 71.0 in | Wt 215.0 lb

## 2017-12-17 DIAGNOSIS — K625 Hemorrhage of anus and rectum: Secondary | ICD-10-CM | POA: Diagnosis not present

## 2017-12-17 DIAGNOSIS — R1013 Epigastric pain: Secondary | ICD-10-CM | POA: Diagnosis not present

## 2017-12-17 DIAGNOSIS — K641 Second degree hemorrhoids: Secondary | ICD-10-CM

## 2017-12-17 DIAGNOSIS — R12 Heartburn: Secondary | ICD-10-CM | POA: Diagnosis not present

## 2017-12-17 MED ORDER — OMEPRAZOLE 40 MG PO CPDR: 40.0000 mg | DELAYED_RELEASE_CAPSULE | Freq: Two times a day (BID) | ORAL | 2 refills | Status: DC

## 2017-12-17 NOTE — Progress Notes (Signed)
Johnny Darby, MD [address]  [place]  [place], [place] [address]  Main: [phone]  Fax: [phone]    Gastroenterology Consultation  Referring Provider:     McLean-Scocuzza, Olivia Mackie * Primary Care Physician:  McLean-Scocuzza, Nino Glow, MD Primary Gastroenterologist:  Dr. Cephas Villa Reason for Consultation:     Rectal bleeding, epigastric pain, symptomatic hemorrhoids        HPI:   [name] is a 42 y.o. male referred by Dr. Terese Door, Nino Glow, MD  for consultation & management of anal fissure and dark stools. He was recently in the ER last week of february secondary to painful bowel movements which were dark colored. Subsequent to that his bowel movements were more and more painful with severe sharp pain right at his anus and he had bright red blood, the patient was acutely relieved after a rupture of the hemorrhoid. He has a long-standing history of arthritis and takes meloxicam on a chronic basis.  He does report constipation. He is concerned because he has history of hemorrhoids. External hemorrhoids and Anal fissure were visualized by the ER physician, his CBC was unremarkable, was discharged home on anusol cream and sitz baths. He is doing only sitz baths, could not afford Anusol cream. He reports that pain is significantly better as long as he is keeping bowel movements regular. Patient is currently on fiber supplements and stool softeners twice daily is keeping his bowels regular and formed. He reports that is the first time ever he had regular bowel movements. Generally, he is constipated, irregular bowel movements every 3-4 days, associated with significant straining. He also reports heartburn and epigastric pain for which he is taking Protonix 40 mg daily started by the ER physician. These symptoms are partially relieved. He reports stopping meloxicam since ER visit.  NSAIDs: meloxicam for arthritis  Antiplts/Anticoagulants/Anti thrombotics: none  GI  Procedures: none He denies family history of GI malignancy He does not smoke or drink alcohol  Past Medical History:  Diagnosis Date  . Bipolar disorder [facility])    psychiatrist in [place]  . Blood in stool   . Depression   . Drug abuse in remission    sober for 6 years  . H/O alcohol abuse   . Hypertension   . Low testosterone in male     Past Surgical History:  Procedure Laterality Date  . APPENDECTOMY     2006     Current Outpatient Medications:  .  buPROPion (WELLBUTRIN XL) 300 MG 24 hr tablet, Take 300 mg by mouth daily. , Disp: , Rfl:  .  busPIRone (BUSPAR) 30 MG tablet, 2 (two) times daily. , Disp: , Rfl:  .  clomiPHENE (CLOMID) 50 MG tablet, Take 1 tablet (50 mg total) by mouth every other day., Disp: 60 tablet, Rfl: 0 .  clonazePAM (KLONOPIN) 0.5 MG tablet, , Disp: , Rfl:  .  fluticasone (FLONASE) 50 MCG/ACT nasal spray, PLACE 2 SPRAYS INTO EACH NOSTRIL QD, Disp: , Rfl:  .  lamoTRIgine (LAMICTAL) 200 MG tablet, Take by mouth daily. , Disp: , Rfl:  .  lithium carbonate 300 MG capsule, Take 300 mg by mouth daily. , Disp: , Rfl:  .  Melatonin 5 MG CAPS, , Disp: , Rfl:  .  Multiple Vitamins-Minerals (MULTIVITAMIN MEN) TABS, Take 1 tablet by mouth daily., Disp: , Rfl:  .  pantoprazole (PROTONIX) 40 MG tablet, Take 1 tablet (40 mg total) by mouth daily. 30 minutes before food, Disp: 90  tablet, Rfl: 1 .  sildenafil (REVATIO) 20 MG tablet, Take 40 mg by mouth., Disp: , Rfl:  .  SUMAtriptan (IMITREX) 100 MG tablet, , Disp: , Rfl:  .  traZODone (DESYREL) 100 MG tablet, Take 200 mg by mouth at bedtime., Disp: , Rfl:  .  acetaminophen (TYLENOL) 500 MG tablet, Take 500 mg by mouth every 6 (six) hours as needed., Disp: , Rfl:  .  albuterol (PROVENTIL HFA) 108 (90 Base) MCG/ACT inhaler, Inhale into the lungs., Disp: , Rfl:  .  hydrocortisone (ANUSOL-HC) 2.5 % rectal cream, Apply rectally 2 times daily (Patient not taking: Reported on 12/17/2017), Disp: 30 g, Rfl: 1 .  omeprazole  (PRILOSEC) 40 MG capsule, Take 1 capsule (40 mg total) by mouth 2 (two) times daily before a meal., Disp: 60 capsule, Rfl: 2  Family History  Problem Relation Age of Onset  . Depression Mother   . Alcohol abuse Father   . Cancer Father        prostate dx'ed 72  . COPD Father   . Hyperlipidemia Father   . Hypertension Father      Social History   Tobacco Use  . Smoking status: Former Smoker    Types: Cigarettes  . Smokeless tobacco: Never Used  Substance Use Topics  . Alcohol use: No    Frequency: Never    Comment: former quit 2013  . Drug use: No    Comment: former quit cocaine 2013     Allergies as of 12/17/2017  . (No Known Allergies)    Review of Systems:    All systems reviewed and negative except where noted in HPI.   Physical Exam:  BP 106/68   Pulse (!) 59   Temp 98.5 F (36.9 C) (Oral)   Ht 5\' 11"  (1.803 m)   Wt 215 lb (97.5 kg)   BMI 29.99 kg/m  No LMP for male patient.  General:   Alert,  Well-developed, well-nourished, pleasant and cooperative in NAD Head:  Normocephalic and atraumatic. Eyes:  Sclera clear, no icterus.   Conjunctiva pink. Ears:  Normal auditory acuity. Nose:  No deformity, discharge, or lesions. Mouth:  No deformity or lesions,oropharynx pink & moist. Neck:  Supple; no masses or thyromegaly. Lungs:  Respirations even and unlabored.  Clear throughout to auscultation.   No wheezes, crackles, or rhonchi. No acute distress. Heart:  Regular rate and rhythm; no murmurs, clicks, rubs, or gallops. Abdomen:  Normal bowel sounds. Soft, non-tender and non-distended without masses, hepatosplenomegaly or hernias noted.  No guarding or rebound tenderness.   Rectal: Not performed Msk:  Symmetrical without gross deformities. Good, equal movement & strength bilaterally. Pulses:  Normal pulses noted. Extremities:  No clubbing or edema.  No cyanosis. Neurologic:  Alert and oriented x3;  grossly normal neurologically. Skin:  Intact without  significant lesions or rashes. No jaundice. Lymph Nodes:  No significant cervical adenopathy. Psych:  Alert and cooperative. Normal mood and affect.  Imaging Studies: No abdominal imaging  Assessment and Plan:   Matilde Markie is a 42 y.o. male with history of bipolar, chronic constipation presents with epigastric pain, heartburn, symptomatic external hemorrhoids, rectal bleeding and anal fissure. It appears that he had thrombosed external hemorrhoid which spontaneously ruptured.  Epigastric pain and heartburn: likely secondary to NSAID use He reports dark stools as well. Hemoglobin is stable - Switch to omeprazole 40 mg twice a day - Continue to avoid NSAID use - EGD with biopsies  Symptomatic hemorrhoids/rectal bleeding - Recommend colonoscopy  for further evaluation of rectal bleeding - Encouraged him to continue sitz baths - discussed with him about outpatient hemorrhoid ligation after colonoscopy  Anal fissure: secondary to chronic constipation - Recommend topical nitroglycerin 0.125% with lidocaine, instructions given - Continue fiber supplements and stool softeners like MiraLAX or Colace as he is doing  I have discussed alternative options, risks & benefits,  which include, but are not limited to, bleeding, infection, perforation,respiratory complication & drug reaction.  The patient agrees with this plan & written consent will be obtained.    Follow up in 4 weeks   Johnny Darby, MD

## 2017-12-17 NOTE — Patient Instructions (Addendum)
High-Fiber Diet  Fiber, also called dietary fiber, is a type of carbohydrate found in fruits, vegetables, whole grains, and beans. A high-fiber diet can have many health benefits. Your health care provider may recommend a high-fiber diet to help:  · Prevent constipation. Fiber can make your bowel movements more regular.  · Lower your cholesterol.  · Relieve hemorrhoids, uncomplicated diverticulosis, or irritable bowel syndrome.  · Prevent overeating as part of a weight-loss plan.  · Prevent heart disease, type 2 diabetes, and certain cancers.    What is my plan?  The recommended daily intake of fiber includes:  · 38 grams for men under age 50.  · 30 grams for men over age 50.  · 25 grams for women under age 50.  · 21 grams for women over age 50.    You can get the recommended daily intake of dietary fiber by eating a variety of fruits, vegetables, grains, and beans. Your health care provider may also recommend a fiber supplement if it is not possible to get enough fiber through your diet.  What do I need to know about a high-fiber diet?  · Fiber supplements have not been widely studied for their effectiveness, so it is better to get fiber through food sources.  · Always check the fiber content on the nutrition facts label of any prepackaged food. Look for foods that contain at least 5 grams of fiber per serving.  · Ask your dietitian if you have questions about specific foods that are related to your condition, especially if those foods are not listed in the following section.  · Increase your daily fiber consumption gradually. Increasing your intake of dietary fiber too quickly may cause bloating, cramping, or gas.  · Drink plenty of water. Water helps you to digest fiber.  What foods can I eat?  Grains  Whole-grain breads. Multigrain cereal. Oats and oatmeal. Brown rice. Barley. Bulgur wheat. Millet. Bran muffins. Popcorn. Rye wafer crackers.  Vegetables   Sweet potatoes. Spinach. Kale. Artichokes. Cabbage. Broccoli. Green peas. Carrots. Squash.  Fruits  Berries. Pears. Apples. Oranges. Avocados. Prunes and raisins. Dried figs.  Meats and Other Protein Sources  Navy, kidney, pinto, and soy beans. Split peas. Lentils. Nuts and seeds.  Dairy  Fiber-fortified yogurt.  Beverages  Fiber-fortified soy milk. Fiber-fortified orange juice.  Other  Fiber bars.  The items listed above may not be a complete list of recommended foods or beverages. Contact your dietitian for more options.  What foods are not recommended?  Grains  White bread. Pasta made with refined flour. White rice.  Vegetables  Fried potatoes. Canned vegetables. Well-cooked vegetables.  Fruits  Fruit juice. Cooked, strained fruit.  Meats and Other Protein Sources  Fatty cuts of meat. Fried poultry or fried fish.  Dairy  Milk. Yogurt. Cream cheese. Sour cream.  Beverages  Soft drinks.  Other  Cakes and pastries. Butter and oils.  The items listed above may not be a complete list of foods and beverages to avoid. Contact your dietitian for more information.  What are some tips for including high-fiber foods in my diet?  · Eat a wide variety of high-fiber foods.  · Make sure that half of all grains consumed each day are whole grains.  · Replace breads and cereals made from refined flour or white flour with whole-grain breads and cereals.  · Replace white rice with brown rice, bulgur wheat, or millet.  · Start the day with a breakfast that is high in fiber,   such as a cereal that contains at least 5 grams of fiber per serving.  · Use beans in place of meat in soups, salads, or pasta.  · Eat high-fiber snacks, such as berries, raw vegetables, nuts, or popcorn.  This information is not intended to replace advice given to you by your health care provider. Make sure you discuss any questions you have with your health care provider.  Document Released: 09/17/2005 Document Revised: 02/23/2016 Document Reviewed: 03/02/2014   Elsevier Interactive Patient Education © 2018 [employer]

## 2017-12-17 NOTE — Telephone Encounter (Signed)
Rx has been called to [facility] for Nitroglycerin Ointment  0.125% .  Provided pharmacist with patients insurance information and contact information.  She will contact patient with pricing and notify when rx is ready.  Thanks [employer]

## 2017-12-18 ENCOUNTER — Ambulatory Visit: Admission: RE | Admit: 2017-12-18 | Payer: Medicare PPO | Source: Ambulatory Visit

## 2017-12-20 ENCOUNTER — Other Ambulatory Visit: Payer: Self-pay

## 2017-12-20 DIAGNOSIS — R1013 Epigastric pain: Secondary | ICD-10-CM

## 2017-12-20 DIAGNOSIS — K625 Hemorrhage of anus and rectum: Secondary | ICD-10-CM

## 2017-12-25 ENCOUNTER — Ambulatory Visit
Admission: RE | Admit: 2017-12-25 | Discharge: 2017-12-25 | Disposition: A | Discharge status: Home / Self Care | Payer: Medicare PPO | Source: Ambulatory Visit | Attending: Student in an Organized Health Care Education/Training Program | Admitting: Student in an Organized Health Care Education/Training Program

## 2017-12-25 DIAGNOSIS — M47812 Spondylosis without myelopathy or radiculopathy, cervical region: Secondary | ICD-10-CM | POA: Insufficient documentation

## 2017-12-25 DIAGNOSIS — M542 Cervicalgia: Secondary | ICD-10-CM | POA: Insufficient documentation

## 2017-12-30 ENCOUNTER — Ambulatory Visit
Admission: RE | Admit: 2017-12-30 | Discharge: 2017-12-30 | Disposition: A | Discharge status: Home / Self Care | Payer: Medicare PPO | Source: Ambulatory Visit | Attending: Gastroenterology | Admitting: Gastroenterology

## 2017-12-30 ENCOUNTER — Encounter
Admission: RE | Disposition: A | Discharge status: Home / Self Care | Payer: Self-pay | Source: Ambulatory Visit | Attending: Gastroenterology

## 2017-12-30 ENCOUNTER — Other Ambulatory Visit: Payer: Self-pay

## 2017-12-30 ENCOUNTER — Ambulatory Visit: Payer: Medicare PPO | Admitting: Certified Registered Nurse Anesthetist

## 2017-12-30 DIAGNOSIS — K253 Acute gastric ulcer without hemorrhage or perforation: Secondary | ICD-10-CM

## 2017-12-30 DIAGNOSIS — K760 Fatty (change of) liver, not elsewhere classified: Secondary | ICD-10-CM | POA: Diagnosis not present

## 2017-12-30 DIAGNOSIS — D125 Benign neoplasm of sigmoid colon: Secondary | ICD-10-CM | POA: Diagnosis not present

## 2017-12-30 DIAGNOSIS — Z79899 Other long term (current) drug therapy: Secondary | ICD-10-CM | POA: Insufficient documentation

## 2017-12-30 DIAGNOSIS — K644 Residual hemorrhoidal skin tags: Secondary | ICD-10-CM | POA: Insufficient documentation

## 2017-12-30 DIAGNOSIS — R1013 Epigastric pain: Secondary | ICD-10-CM | POA: Diagnosis not present

## 2017-12-30 DIAGNOSIS — K259 Gastric ulcer, unspecified as acute or chronic, without hemorrhage or perforation: Secondary | ICD-10-CM | POA: Insufficient documentation

## 2017-12-30 DIAGNOSIS — D122 Benign neoplasm of ascending colon: Secondary | ICD-10-CM | POA: Diagnosis not present

## 2017-12-30 DIAGNOSIS — I10 Essential (primary) hypertension: Secondary | ICD-10-CM | POA: Insufficient documentation

## 2017-12-30 DIAGNOSIS — K635 Polyp of colon: Secondary | ICD-10-CM | POA: Insufficient documentation

## 2017-12-30 DIAGNOSIS — K625 Hemorrhage of anus and rectum: Secondary | ICD-10-CM

## 2017-12-30 DIAGNOSIS — Z87891 Personal history of nicotine dependence: Secondary | ICD-10-CM | POA: Diagnosis not present

## 2017-12-30 DIAGNOSIS — F319 Bipolar disorder, unspecified: Secondary | ICD-10-CM | POA: Diagnosis not present

## 2017-12-30 HISTORY — PX: COLONOSCOPY WITH PROPOFOL: SHX5780

## 2017-12-30 HISTORY — DX: Headache: R51

## 2017-12-30 HISTORY — DX: Fatty (change of) liver, not elsewhere classified: K76.0

## 2017-12-30 HISTORY — PX: ESOPHAGOGASTRODUODENOSCOPY (EGD) WITH PROPOFOL: SHX5813

## 2017-12-30 HISTORY — DX: Headache, unspecified: R51.9

## 2017-12-30 SURGERY — COLONOSCOPY WITH PROPOFOL
Anesthesia: General

## 2017-12-30 MED ORDER — LIDOCAINE HCL (CARDIAC) 20 MG/ML IV SOLN
INTRAVENOUS | Status: DC | PRN
  Administered 2017-12-30: 100 mg via INTRAVENOUS

## 2017-12-30 MED ORDER — FENTANYL CITRATE (PF) 100 MCG/2ML IJ SOLN
INTRAMUSCULAR | Status: AC
  Filled 2017-12-30: qty 2

## 2017-12-30 MED ORDER — PROPOFOL 500 MG/50ML IV EMUL
INTRAVENOUS | Status: AC
Start: 2017-12-30 — End: ?
  Filled 2017-12-30: qty 50

## 2017-12-30 MED ORDER — GLYCOPYRROLATE 0.2 MG/ML IJ SOLN
INTRAMUSCULAR | Status: DC | PRN
  Administered 2017-12-30: 0.2 mg via INTRAVENOUS

## 2017-12-30 MED ORDER — PROPOFOL 500 MG/50ML IV EMUL
INTRAVENOUS | Status: DC | PRN
  Administered 2017-12-30: 140 ug/kg/min via INTRAVENOUS

## 2017-12-30 MED ORDER — SODIUM CHLORIDE 0.9 % IV SOLN
INTRAVENOUS | Status: DC
  Administered 2017-12-30 (×2): via INTRAVENOUS

## 2017-12-30 MED ORDER — PROPOFOL 10 MG/ML IV BOLUS
INTRAVENOUS | Status: DC | PRN
  Administered 2017-12-30: 60 mg via INTRAVENOUS
  Administered 2017-12-30: 10 mg via INTRAVENOUS

## 2017-12-30 MED ORDER — FENTANYL CITRATE (PF) 100 MCG/2ML IJ SOLN
INTRAMUSCULAR | Status: DC | PRN
  Administered 2017-12-30: 100 ug via INTRAVENOUS

## 2017-12-30 MED ORDER — ONDANSETRON HCL 4 MG/2ML IJ SOLN
INTRAMUSCULAR | Status: DC | PRN
  Administered 2017-12-30: 4 mg via INTRAVENOUS

## 2017-12-30 NOTE — Op Note (Signed)
[facility] Gastroenterology Patient Name: Johnny Villa Procedure Date: 12/30/2017 10:30 AM MRN: [id number] Account #: [id number] Date of Birth: Nov 13, 1975 Admit Type: Outpatient Age: 42 Room: [facility] ENDO ROOM 2 Gender: Male Note Status: Finalized Procedure:            Colonoscopy Indications:          Rectal bleeding Providers:            Lin Landsman MD, MD Medicines:            Monitored Anesthesia Care Complications:        No immediate complications. Estimated blood loss: None. Procedure:            Pre-Anesthesia Assessment:                       - Prior to the procedure, a History and Physical was                        performed, and patient medications and allergies were                        reviewed. The patient is competent. The risks and                        benefits of the procedure and the sedation options and                        risks were discussed with the patient. All questions                        were answered and informed consent was obtained.                        Patient identification and proposed procedure were                        verified by the physician, the nurse, the                        anesthesiologist, the anesthetist and the technician in                        the pre-procedure area in the procedure room in the                        endoscopy suite. Mental Status Examination: alert and                        oriented. Airway Examination: normal oropharyngeal                        airway and neck mobility. Respiratory Examination:                        clear to auscultation. CV Examination: normal.                        Prophylactic Antibiotics: The patient does not require  prophylactic antibiotics. Prior Anticoagulants: The                        patient has taken no previous anticoagulant or                        antiplatelet agents. ASA Grade Assessment: II - A       patient with mild systemic disease. After reviewing the                        risks and benefits, the patient was deemed in                        satisfactory condition to undergo the procedure. The                        anesthesia plan was to use monitored anesthesia care                        (MAC). Immediately prior to administration of                        medications, the patient was re-assessed for adequacy                        to receive sedatives. The heart rate, respiratory rate,                        oxygen saturations, blood pressure, adequacy of                        pulmonary ventilation, and response to care were                        monitored throughout the procedure. The physical status                        of the patient was re-assessed after the procedure.                       After obtaining informed consent, the colonoscope was                        passed under direct vision. Throughout the procedure,                        the patient's blood pressure, pulse, and oxygen                        saturations were monitored continuously. The                        Colonoscope was introduced through the anus and                        advanced to the the terminal ileum. The colonoscopy was                        performed without difficulty. The patient tolerated the  procedure well. The quality of the bowel preparation                        was evaluated using the BBPS [facility] Bowel Preparation                        Scale) with scores of: Right Colon = 3, Transverse                        Colon = 3 and Left Colon = 3 (entire mucosa seen well                        with no residual staining, small fragments of stool or                        opaque liquid). The total BBPS score equals 9. Findings:      The perianal and digital rectal examinations were normal. Pertinent       negatives include normal sphincter tone and no palpable  rectal lesions.      The terminal ileum appeared normal.      A diminutive polyp was found in the ascending colon. The polyp was       sessile. The polyp was removed with a cold biopsy forceps. Resection and       retrieval were complete.      A 5 mm polyp was found in the sigmoid colon. The polyp was sessile. The       polyp was removed with a cold snare. Resection and retrieval were       complete.      External hemorrhoids were found during retroflexion. The hemorrhoids       were large. Impression:           - The examined portion of the ileum was normal.                       - One diminutive polyp in the ascending colon, removed                        with a cold biopsy forceps. Resected and retrieved.                       - One 5 mm polyp in the sigmoid colon, removed with a                        cold snare. Resected and retrieved.                       - External hemorrhoids. Recommendation:       - Repeat colonoscopy in 5-10 years for surveillance                        based on pathology results.                       - Discharge patient to home.                       - Resume previous diet daily.                       -  Continue present medications.                       - Return to my office as previously scheduled. Procedure Code(s):    --- Professional ---                       [phone], Colonoscopy, flexible; with removal of tumor(s),                        polyp(s), or other lesion(s) by snare technique                       45380, 39, Colonoscopy, flexible; with biopsy, single                        or multiple Diagnosis Code(s):    --- Professional ---                       K64.4, Residual hemorrhoidal skin tags                       D12.2, Benign neoplasm of ascending colon                       D12.5, Benign neoplasm of sigmoid colon                       K62.5, Hemorrhage of anus and rectum CPT copyright 2016 American Medical Association. All rights reserved. The  codes documented in this report are preliminary and upon coder review may  be revised to meet current compliance requirements. Dr. Ulyess Mort Lin Landsman MD, MD 12/30/2017 11:16:26 AM This report has been signed electronically. Number of Addenda: 0 Note Initiated On: 12/30/2017 10:30 AM Scope Withdrawal Time: 0 hours 14 minutes 5 seconds  Total Procedure Duration: 0 hours 17 minutes 31 seconds       [facility]

## 2017-12-30 NOTE — Anesthesia Procedure Notes (Signed)
Performed by: Azeem Poorman, CRNA Pre-anesthesia Checklist: Patient identified, Emergency Drugs available, Suction available, Patient being monitored and Timeout performed Patient Re-evaluated:Patient Re-evaluated prior to induction Oxygen Delivery Method: Nasal cannula Induction Type: IV induction       

## 2017-12-30 NOTE — Anesthesia Post-op Follow-up Note (Signed)
Anesthesia QCDR form completed.        

## 2017-12-30 NOTE — Anesthesia Preprocedure Evaluation (Signed)
Anesthesia Evaluation  Patient identified by MRN, date of birth, ID band Patient awake    Reviewed: Allergy & Precautions, NPO status , Patient's Chart, lab work & pertinent test results  Airway Mallampati: II  TM Distance: >3 FB     Dental  (+) Teeth Intact   Pulmonary former smoker,    Pulmonary exam normal        Cardiovascular hypertension, Normal cardiovascular exam     Neuro/Psych  Headaches, PSYCHIATRIC DISORDERS Depression Bipolar Disorder    GI/Hepatic (+)     substance abuse  alcohol use, Fatty liver Blood in stool   Endo/Other  negative endocrine ROS  Renal/GU negative Renal ROS  negative genitourinary   Musculoskeletal negative musculoskeletal ROS (+)   Abdominal Normal abdominal exam  (+)   Peds negative pediatric ROS (+)  Hematology negative hematology ROS (+)   Anesthesia Other Findings   Reproductive/Obstetrics                             Anesthesia Physical Anesthesia Plan  ASA: III  Anesthesia Plan: General   Post-op Pain Management:    Induction: Intravenous  PONV Risk Score and Plan:   Airway Management Planned: Nasal Cannula  Additional Equipment:   Intra-op Plan:   Post-operative Plan:   Informed Consent: I have reviewed the patients History and Physical, chart, labs and discussed the procedure including the risks, benefits and alternatives for the proposed anesthesia with the patient or authorized representative who has indicated his/her understanding and acceptance.   Dental advisory given  Plan Discussed with: CRNA and Surgeon  Anesthesia Plan Comments:         Anesthesia Quick Evaluation

## 2017-12-30 NOTE — Progress Notes (Signed)
Pt in the restroom at this time cleaning himself up.

## 2017-12-30 NOTE — Anesthesia Postprocedure Evaluation (Signed)
Anesthesia Post Note  Patient: Johnny Villa  Procedure(s) Performed: COLONOSCOPY WITH PROPOFOL (N/A ) ESOPHAGOGASTRODUODENOSCOPY (EGD) WITH PROPOFOL (N/A )  Patient location during evaluation: PACU Anesthesia Type: General Level of consciousness: awake and alert and oriented Pain management: pain level controlled Vital Signs Assessment: post-procedure vital signs reviewed and stable Respiratory status: spontaneous breathing Cardiovascular status: blood pressure returned to baseline Anesthetic complications: no     Last Vitals:  Vitals:   12/30/17 1143 12/30/17 1158  BP: 126/73 121/78  Pulse: (!) 56 83  Resp: 19 (!) 8  Temp:    SpO2: 100% 100%    Last Pain:  Vitals:   12/30/17 1158  PainSc: 0-No pain                 Gerda Yin

## 2017-12-30 NOTE — Transfer of Care (Signed)
Immediate Anesthesia Transfer of Care Note  Patient: Johnny Villa  Procedure(s) Performed: COLONOSCOPY WITH PROPOFOL (N/A ) ESOPHAGOGASTRODUODENOSCOPY (EGD) WITH PROPOFOL (N/A )  Patient Location: PACU  Anesthesia Type:General  Level of Consciousness: oriented  Airway & Oxygen Therapy: Patient Spontanous Breathing and Patient connected to nasal cannula oxygen  Post-op Assessment: Report given to RN and Post -op Vital signs reviewed and stable  Post vital signs: Reviewed and stable  Last Vitals:  Vitals Value Taken Time  BP 92/79 12/30/2017 11:18 AM  Temp    Pulse 60 12/30/2017 11:19 AM  Resp 18 12/30/2017 11:19 AM  SpO2 99 % 12/30/2017 11:19 AM  Vitals shown include unvalidated device data.  Last Pain:  Vitals:   12/30/17 0937  PainSc: 0-No pain         Complications: No apparent anesthesia complications

## 2017-12-30 NOTE — Op Note (Signed)
[facility] Gastroenterology Patient Name: Johnny Villa Procedure Date: 12/30/2017 10:30 AM MRN: [id number] Account #: [id number] Date of Birth: [birth year] Admit Type: Outpatient Age: 42 Room: [facility] ENDO ROOM 2 Gender: Male Note Status: Finalized Procedure:            Upper GI endoscopy Indications:          Dyspepsia Providers:            Lin Landsman MD, MD Medicines:            Monitored Anesthesia Care Complications:        No immediate complications. Estimated blood loss: None. Procedure:            Pre-Anesthesia Assessment:                       - Prior to the procedure, a History and Physical was                        performed, and patient medications and allergies were                        reviewed. The patient is competent. The risks and                        benefits of the procedure and the sedation options and                        risks were discussed with the patient. All questions                        were answered and informed consent was obtained.                        Patient identification and proposed procedure were                        verified by the physician, the nurse, the                        anesthesiologist, the anesthetist and the technician in                        the pre-procedure area in the procedure room in the                        endoscopy suite. Mental Status Examination: alert and                        oriented. Airway Examination: normal oropharyngeal                        airway and neck mobility. Respiratory Examination:                        clear to auscultation. CV Examination: normal.                        Prophylactic Antibiotics: The patient does not require  prophylactic antibiotics. Prior Anticoagulants: The                        patient has taken no previous anticoagulant or                        antiplatelet agents. ASA Grade Assessment: II - A        patient with mild systemic disease. After reviewing the                        risks and benefits, the patient was deemed in                        satisfactory condition to undergo the procedure. The                        anesthesia plan was to use monitored anesthesia care                        (MAC). Immediately prior to administration of                        medications, the patient was re-assessed for adequacy                        to receive sedatives. The heart rate, respiratory rate,                        oxygen saturations, blood pressure, adequacy of                        pulmonary ventilation, and response to care were                        monitored throughout the procedure. The physical status                        of the patient was re-assessed after the procedure.                       After obtaining informed consent, the endoscope was                        passed under direct vision. Throughout the procedure,                        the patient's blood pressure, pulse, and oxygen                        saturations were monitored continuously. The Endoscope                        was introduced through the mouth, and advanced to the                        second part of duodenum. The upper GI endoscopy was                        accomplished without difficulty. The patient tolerated  the procedure well. Findings:      The duodenal bulb and second portion of the duodenum were normal.      One non-bleeding superficial gastric ulcer with a clean ulcer base       (Forrest Class III) was found on the greater curvature of the gastric       antrum. The lesion was 6 mm in largest dimension.      The cardia, gastric fundus, gastric body, incisura and pylorus were       normal. Biopsies were taken with a cold forceps for Helicobacter pylori       testing.      The cardia and gastric fundus were normal on retroflexion.      The gastroesophageal  junction and examined esophagus were normal. Impression:           - Normal duodenal bulb and second portion of the                        duodenum.                       - Non-bleeding gastric ulcer with a clean ulcer base                        (Forrest Class III).                       - Normal cardia, gastric fundus, gastric body, incisura                        and pylorus. Biopsied.                       - Normal gastroesophageal junction and esophagus. Recommendation:       - Await pathology results.                       - No ibuprofen, naproxen, or other non-steroidal                        anti-inflammatory drugs. Procedure Code(s):    --- Professional ---                       [phone], Esophagogastroduodenoscopy, flexible, transoral;                        with biopsy, single or multiple Diagnosis Code(s):    --- Professional ---                       K25.9, Gastric ulcer, unspecified as acute or chronic,                        without hemorrhage or perforation                       R10.13, Epigastric pain CPT copyright 2016 American Medical Association. All rights reserved. The codes documented in this report are preliminary and upon coder review may  be revised to meet current compliance requirements. Dr. Ulyess Mort Lin Landsman MD, MD 12/30/2017 10:53:32 AM This report has been signed electronically. Number of Addenda: 0 Note Initiated On: 12/30/2017 10:30 AM      [place]  [facility]

## 2017-12-30 NOTE — H&P (Signed)
Cephas Darby, MD [address]  [place]  [place], [place] [address]  Main: [phone]  Fax: [phone] Pager: [phone]  Primary Care Physician:  McLean-Scocuzza, Nino Glow, MD Primary Gastroenterologist:  Dr. Cephas Darby  Pre-Procedure History & Physical: HPI:  Johnny Villa is a 42 y.o. male is here for an endoscopy and colonoscopy.   Past Medical History:  Diagnosis Date  . Bipolar disorder [facility])    psychiatrist in [place]  . Blood in stool   . Depression   . Drug abuse in remission    sober for 6 years  . Fatty liver 01/2018  . H/O alcohol abuse   . Headache    migraines  . Hypertension   . Low testosterone in male     Past Surgical History:  Procedure Laterality Date  . APPENDECTOMY     2006    Prior to Admission medications   Medication Sig Start Date End Date Taking? Authorizing Provider  acetaminophen (TYLENOL) 500 MG tablet Take 500 mg by mouth every 6 (six) hours as needed.   Yes [provider]  buPROPion (WELLBUTRIN XL) 300 MG 24 hr tablet Take 300 mg by mouth daily.  09/16/17  Yes [provider]  fluticasone (FLONASE) 50 MCG/ACT nasal spray PLACE 2 SPRAYS INTO EACH NOSTRIL QD 03/29/16  Yes [provider]  SUMAtriptan (IMITREX) 100 MG tablet  01/03/17  Yes [provider]  albuterol (PROVENTIL HFA) 108 (90 Base) MCG/ACT inhaler Inhale into the lungs. 08/05/17 08/05/18  [provider]  busPIRone (BUSPAR) 30 MG tablet 2 (two) times daily.  09/05/17   [provider]  clomiPHENE (CLOMID) 50 MG tablet Take 1 tablet (50 mg total) by mouth every other day. 11/26/17   McLean-Scocuzza, Nino Glow, MD  clonazePAM Bobbye [place]) 0.5 MG tablet  12/11/17   [provider]  hydrocortisone (ANUSOL-HC) 2.5 % rectal cream Apply rectally 2 times daily Patient not taking: Reported on 12/17/2017 11/24/17 11/24/18  Darel Hong, MD  lamoTRIgine (LAMICTAL) 200 MG tablet Take by mouth daily.  09/05/17    [provider]  lithium carbonate 300 MG capsule Take 300 mg by mouth daily.  09/26/17   [provider]  Melatonin 5 MG CAPS  07/22/17   [provider]  Multiple Vitamins-Minerals (MULTIVITAMIN MEN) TABS Take 1 tablet by mouth daily. 07/22/17   [provider]  omeprazole (PRILOSEC) 40 MG capsule Take 1 capsule (40 mg total) by mouth 2 (two) times daily before a meal. 12/17/17 01/16/18  Vanga, Tally Due, MD  pantoprazole (PROTONIX) 40 MG tablet Take 1 tablet (40 mg total) by mouth daily. 30 minutes before food 11/26/17   McLean-Scocuzza, Nino Glow, MD  sildenafil (REVATIO) 20 MG tablet Take 40 mg by mouth.    [provider]  traZODone (DESYREL) 100 MG tablet Take 200 mg by mouth at bedtime.    [provider]    Allergies as of 12/20/2017  . (No Known Allergies)    Family History  Problem Relation Age of Onset  . Depression Mother   . Alcohol abuse Father   . Cancer Father        prostate dx'ed 21  . COPD Father   . Hyperlipidemia Father   . Hypertension Father     Social History   Socioeconomic History  . Marital status: Single    Spouse name: Not on file  . Number of children: Not on file  . Years of education: Not on  file  . Highest education level: Not on file  Occupational History  . Occupation: disabled  Social Needs  . Financial resource strain: Not on file  . Food insecurity:    Worry: Not on file    Inability: Not on file  . Transportation needs:    Medical: Not on file    Non-medical: Not on file  Tobacco Use  . Smoking status: Former Smoker    Types: Cigarettes    Last attempt to quit: 2016    Years since quitting: 3.2  . Smokeless tobacco: Never Used  Substance and Sexual Activity  . Alcohol use: No    Frequency: Never    Comment: former quit 03/14/2012  . Drug use: No    Comment: former quit cocaine 03/14/2012   . Sexual activity: Yes  Lifestyle  . Physical activity:    Days per week: Not on  file    Minutes per session: Not on file  . Stress: Not on file  Relationships  . Social connections:    Talks on phone: Not on file    Gets together: Not on file    Attends religious service: Not on file    Active member of club or organization: Not on file    Attends meetings of clubs or organizations: Not on file    Relationship status: Not on file  . Intimate partner violence:    Fear of current or ex partner: Not on file    Emotionally abused: Not on file    Physically abused: Not on file    Forced sexual activity: Not on file  Other Topics Concern  . Not on file  Social History Narrative   No kids girlfriend has 3 kids    MBA   Disabled     Review of Systems: See HPI, otherwise negative ROS  Physical Exam: BP 121/62   Pulse (!) 50   Temp 97.6 F (36.4 C)   Resp 16   Ht 5\' 11"  (1.803 m)   Wt 205 lb (93 kg)   SpO2 100%   BMI 28.59 kg/m  General:   Alert,  pleasant and cooperative in NAD Head:  Normocephalic and atraumatic. Neck:  Supple; no masses or thyromegaly. Lungs:  Clear throughout to auscultation.    Heart:  Regular rate and rhythm. Abdomen:  Soft, nontender and nondistended. Normal bowel sounds, without guarding, and without rebound.   Neurologic:  Alert and  oriented x4;  grossly normal neurologically.  Impression/Plan: Vermon Grays is here for an endoscopy and colonoscopy to be performed for dyspepsia and rectal bleeding  Risks, benefits, limitations, and alternatives regarding  endoscopy and colonoscopy have been reviewed with the patient.  Questions have been answered.  All parties agreeable.   Sherri Sear, MD  12/30/2017, 9:49 AM

## 2018-01-01 ENCOUNTER — Other Ambulatory Visit: Payer: Self-pay | Admitting: Sports Medicine

## 2018-01-01 ENCOUNTER — Encounter: Payer: Self-pay | Admitting: Gastroenterology

## 2018-01-01 DIAGNOSIS — M25551 Pain in right hip: Principal | ICD-10-CM

## 2018-01-01 DIAGNOSIS — G8929 Other chronic pain: Secondary | ICD-10-CM

## 2018-01-06 ENCOUNTER — Encounter: Payer: Self-pay | Admitting: Student in an Organized Health Care Education/Training Program

## 2018-01-08 ENCOUNTER — Encounter: Payer: Self-pay | Admitting: Student in an Organized Health Care Education/Training Program

## 2018-01-08 ENCOUNTER — Other Ambulatory Visit: Payer: Self-pay

## 2018-01-08 ENCOUNTER — Ambulatory Visit
Payer: Medicare PPO | Attending: Student in an Organized Health Care Education/Training Program | Admitting: Student in an Organized Health Care Education/Training Program

## 2018-01-08 VITALS — BP 120/77 | HR 58 | Temp 98.7°F | Resp 16 | Ht 71.0 in | Wt 200.0 lb

## 2018-01-08 DIAGNOSIS — Z87891 Personal history of nicotine dependence: Secondary | ICD-10-CM | POA: Insufficient documentation

## 2018-01-08 DIAGNOSIS — Z79899 Other long term (current) drug therapy: Secondary | ICD-10-CM | POA: Diagnosis not present

## 2018-01-08 DIAGNOSIS — I10 Essential (primary) hypertension: Secondary | ICD-10-CM | POA: Diagnosis not present

## 2018-01-08 DIAGNOSIS — M25561 Pain in right knee: Secondary | ICD-10-CM | POA: Diagnosis not present

## 2018-01-08 DIAGNOSIS — F1411 Cocaine abuse, in remission: Secondary | ICD-10-CM | POA: Diagnosis not present

## 2018-01-08 DIAGNOSIS — M542 Cervicalgia: Secondary | ICD-10-CM | POA: Diagnosis not present

## 2018-01-08 DIAGNOSIS — G894 Chronic pain syndrome: Secondary | ICD-10-CM | POA: Diagnosis not present

## 2018-01-08 DIAGNOSIS — F319 Bipolar disorder, unspecified: Secondary | ICD-10-CM | POA: Insufficient documentation

## 2018-01-08 DIAGNOSIS — M25551 Pain in right hip: Secondary | ICD-10-CM | POA: Insufficient documentation

## 2018-01-08 DIAGNOSIS — N529 Male erectile dysfunction, unspecified: Secondary | ICD-10-CM | POA: Insufficient documentation

## 2018-01-08 DIAGNOSIS — M47812 Spondylosis without myelopathy or radiculopathy, cervical region: Secondary | ICD-10-CM | POA: Insufficient documentation

## 2018-01-08 DIAGNOSIS — M5030 Other cervical disc degeneration, unspecified cervical region: Secondary | ICD-10-CM | POA: Diagnosis not present

## 2018-01-08 DIAGNOSIS — E291 Testicular hypofunction: Secondary | ICD-10-CM | POA: Diagnosis not present

## 2018-01-08 DIAGNOSIS — F1021 Alcohol dependence, in remission: Secondary | ICD-10-CM | POA: Diagnosis not present

## 2018-01-08 MED ORDER — ORPHENADRINE CITRATE 30 MG/ML IJ SOLN
60.0000 mg | Freq: Once | INTRAMUSCULAR | Status: AC
  Administered 2018-01-08: 60 mg via INTRAMUSCULAR
  Filled 2018-01-08: qty 2

## 2018-01-08 MED ORDER — KETOROLAC TROMETHAMINE 60 MG/2ML IM SOLN
60.0000 mg | Freq: Once | INTRAMUSCULAR | Status: AC
  Administered 2018-01-08: 60 mg via INTRAMUSCULAR
  Filled 2018-01-08: qty 2

## 2018-01-08 NOTE — Progress Notes (Signed)
Safety precautions to be maintained throughout the outpatient stay will include: orient to surroundings, keep bed in low position, maintain call bell within reach at all times, provide assistance with transfer out of bed and ambulation.  

## 2018-01-08 NOTE — Progress Notes (Signed)
Patient's Name: Johnny Villa  MRN: [id number]  Referring Provider: Orland Mustard *  DOB: 08/05/1976  PCP: McLean-Scocuzza, Nino Glow, MD  DOS: 01/08/2018  Note by: Gillis Santa, MD  Service setting: Ambulatory outpatient  Specialty: Interventional Pain Management  Location: [facility] (AMB) Pain Management Facility    Patient type: Established   Primary Reason(s) for Visit: Encounter for evaluation before starting new chronic pain management plan of care (Level of risk: moderate) CC: Neck Pain  HPI  [name] is a 42 y.o. year old, male patient, who comes today for a follow-up evaluation to review the test results and decide on a treatment plan. He has Back pain; Right hip pain; Knee pain, right; Hypogonadism in male; Low testosterone; Erectile dysfunction; Bipolar 1 disorder ([place]); Epigastric pain; Anal fissure; Blood in stool; Hemorrhoids; Cervicalgia; Chronic right shoulder pain; Cervical spondylosis without myelopathy (C4,5,6,7; L>R); Cervical facet joint syndrome; and DDD (degenerative disc disease), cervical on their problem list. His primarily concern today is the Neck Pain  Pain Assessment: Location:   Neck Radiating: radiates from back of the head down into trapezius and up to front of head Onset:   >3 months ago Duration: Chronic pain Quality: Aching, Burning, Shooting, Throbbing Severity: 8 /10 (self-reported pain score)  Note: Reported level is inconsistent with clinical observations. Clinically the patient looks like a 2/10 A 2/10 is viewed as "Mild to Moderate" and described as noticeable and distracting. Impossible to hide from other people. More frequent flare-ups. Still possible to adapt and function close to normal. It can be very annoying and may have occasional stronger flare-ups. With discipline, patients may get used to it and adapt.       When using our objective Pain Scale, levels between 6 and 10/10 are said to belong in an emergency room, as it progressively worsens  from a 6/10, described as severely limiting, requiring emergency care not usually available at an outpatient pain management facility. At a 6/10 level, communication becomes difficult and requires great effort. Assistance to reach the emergency department may be required. Facial flushing and profuse sweating along with potentially dangerous increases in heart rate and blood pressure will be evident. Effect on ADL:  limits ability to reach things in kitchen or any other prolonged overhead activities Timing:  worse in morning Modifying factors:  improved with rest, massage therapy, and cervical facets in past (done in [place])  [name] comes in today for a follow-up visit after his initial evaluation on 12/06/2017. Today we went over the results of his tests. These were explained in "Layman's terms". During today's appointment we went over my diagnostic impression, as well as the proposed treatment plan.  Patient has been participating in physical therapy as well as obtaining deep tissue massages and states that his neck pain is gotten worse.  Patient states that he is having a bad headache that is been present for the last 3 weeks.  He noticed that this headache got worse after electrical stem and deep tissue massage.  Patient has seen Dr. Candelaria Stagers for his right hip pain and has ordered a MRI of his right hip which is to be done on 01/21/2018.  Patient did have his cervical MRI completed with results below.  Patient is going for vacation at the end of April and is hoping to have his neck pain improved before his trip.  In considering the treatment plan options, [name] was reminded that I no longer take patients for medication management only. I asked him  to let me know if he had no intention of taking advantage of the interventional therapies, so that we could make arrangements to provide this space to someone interested. I also made it clear that undergoing interventional therapies for the purpose of  getting pain medications is very inappropriate on the part of a patient, and it will not be tolerated in this practice. This type of behavior would suggest true addiction and therefore it requires referral to an addiction specialist.   Further details on both, my assessment(s), as well as the proposed treatment plan, please see below.  Controlled Substance Pharmacotherapy Assessment REMS (Risk Evaluation and Mitigation Strategy)   Opioid Risk Tool - 12/03/17 1024      Family History of Substance Abuse   Alcohol  Positive Male    Illegal Drugs  Negative    Rx Drugs  Negative      Personal History of Substance Abuse   Alcohol  Positive Male or Male sober 6 years   sober 6 years   Illegal Drugs  Negative    Rx Drugs  Negative      Age   Age between 3-45 years   Yes      History of Preadolescent Sexual Abuse   History of Preadolescent Sexual Abuse  Negative or Male      Psychological Disease   Psychological Disease  Positive    Bipolar  Positive    Depression  Negative      Total Score   Opioid Risk Tool Scoring  9    Opioid Risk Interpretation  High Risk      ORT Scoring interpretation table:  Score <3 = Low Risk for SUD  Score between 4-7 = Moderate Risk for SUD  Score >8 = High Risk for Opioid Abuse   Risk Mitigation Strategies:  Patient opioid safety counseling: Opioid therapy will not be included in the treatment plan. Patient-Prescriber Agreement (PPA): No agreement signed.  Controlled substance notification to other providers: None required. No opioid therapy.  Pharmacologic Plan: The patient has expressed his desire to avoid opioids as much as possible.  Patient has not found benefit with opioid therapy in the past and we will focus on non-opioid-based chronic pain management.  Of note patient does have a history of alcohol and cocaine abuse but has been clean since 2013.             Laboratory Chemistry  Inflammation Markers (CRP: Acute Phase) (ESR: Chronic  Phase) Lab Results  Component Value Date   CRP 1.0 10/25/2017   ESRSEDRATE 5 10/25/2017                         Rheumatology Markers Lab Results  Component Value Date   RF <10.0 10/25/2017   LABURIC 4.4 10/25/2017                        Renal Function Markers Lab Results  Component Value Date   BUN 15 11/24/2017   CREATININE 1.04 11/24/2017   GFRAA >60 11/24/2017   GFRNONAA >60 11/24/2017                              Hepatic Function Markers Lab Results  Component Value Date   AST 25 11/24/2017   ALT 17 11/24/2017   ALBUMIN 4.3 11/24/2017   ALKPHOS 34 (L) 11/24/2017  Electrolytes Lab Results  Component Value Date   NA 140 11/24/2017   K 3.8 11/24/2017   CL 109 11/24/2017   CALCIUM 9.1 11/24/2017                        Neuropathy Markers Lab Results  Component Value Date   HGBA1C 5.2 10/25/2017                        Bone Pathology Markers Lab Results  Component Value Date   TESTOFREE 20.5 10/25/2017   TESTOSTERONE 743 10/25/2017                         Coagulation Parameters Lab Results  Component Value Date   PLT 202 11/24/2017                        Cardiovascular Markers Lab Results  Component Value Date   HGB 14.8 11/24/2017   HCT 44.1 11/24/2017                         CA Markers No results found for: CEA, CA125, LABCA2                      Note: Lab results reviewed.  Recent Diagnostic Imaging Review  Cervical Imaging: Cervical MR wo contrast:  Results for orders placed during the hospital encounter of 12/25/17  MR CERVICAL SPINE WO CONTRAST   Narrative CLINICAL DATA:  Football injury in 1996. Left-sided neck pain with tingling of the shoulder and left arm over the last 6 months.  EXAM: MRI CERVICAL SPINE WITHOUT CONTRAST  TECHNIQUE: Multiplanar, multisequence MR imaging of the cervical spine was performed. No intravenous contrast was administered.  COMPARISON:  Radiography  11/26/2017  FINDINGS: Alignment: Normal  Vertebrae: Normal  Cord: Normal  Posterior Fossa, vertebral arteries, paraspinal tissues: Normal  Disc levels:  No abnormality at the foramen magnum, C1-2 or C2-3.  C3-4: Minimal uncovertebral hypertrophy. No significant canal or foraminal narrowing.  C4-5: Bilateral uncovertebral hypertrophy left more than right. No central canal stenosis. Foraminal narrowing left worse than right. This could affect the left C5 nerve.  C5-6: Bilateral uncovertebral hypertrophy. Suspicion of a small left foraminal disc herniation. No central canal stenosis. Bilateral foraminal narrowing could affect either C6 nerve, more likely the left because of the foraminal disc.  C6-7: Normal  C7-T1: Normal  IMPRESSION: Degenerative spondylosis, more pronounced at C4-5 and C5-6 than at C3-4. There is foraminal narrowing at those levels that would have potential to affect either C5 or C6 nerve. This is more pronounced on the left than the right. At C5-6, I suspect there is a small foraminal disc component that probably explains the recent clinical change.   Electronically Signed   By: Nelson Chimes M.D.   On: 12/25/2017 13:51    Cervical DG complete:  Results for orders placed in visit on 11/26/17  DG Cervical Spine Complete   Narrative CLINICAL DATA:  Neck and right shoulder pain, no acute injury  EXAM: CERVICAL SPINE - COMPLETE 4+ VIEW  COMPARISON:  None.  FINDINGS: The cervical vertebrae are in normal alignment. Intervertebral disc spaces appear normal. Minimal anterior osteophyte formation is present at C4-5 and C5-6 levels. No prevertebral soft tissue swelling is seen. On oblique views the foramina are widely patent. The odontoid process is intact.  The lung apices are clear.  IMPRESSION: Normal alignment with normal intervertebral disc spaces. Minimal anterior osteophyte formation at C4-5 and C5-6.   Electronically Signed   By: Ivar Drape M.D.   On: 11/26/2017 10:59     Shoulder-R DG:  Results for orders placed in visit on 11/26/17  DG Shoulder Right   Narrative CLINICAL DATA:  Neck and right shoulder pain, no acute injury  EXAM: RIGHT SHOULDER - 2+ VIEW  COMPARISON:  Chest x-ray of 05/11/2007  FINDINGS: The right humeral head is in normal position and the glenohumeral joint space appears normal. There is a small bony density from the inferior glenoid rim which could be due to prior trauma and represent a small avulsion. The right [facility] joint appears normally aligned on the images obtained.  IMPRESSION: Question small avulsion fragment from the inferior glenoid rim. The right shoulder joint space appears normal.   Electronically Signed   By: Ivar Drape M.D.   On: 11/26/2017 11:00     Hip-R DG 2-3 views:  Results for orders placed in visit on 10/24/17  DG HIP UNILAT WITH PELVIS 2-3 VIEWS RIGHT   Narrative CLINICAL DATA:  Chronic RIGHT hip and knee pain for several years, no known injury or surgery  EXAM: DG HIP (WITH OR WITHOUT PELVIS) 2-3V RIGHT  COMPARISON:  None  FINDINGS: Osseous mineralization normal.  Hip and SI joint spaces preserved.  No acute fracture, dislocation, or bone destruction.  Small old non fused ossicle adjacent to the lateral margin of the acetabulum.  IMPRESSION: No acute osseous abnormalities.   Electronically Signed   By: Lavonia Dana M.D.   On: 10/25/2017 08:06     Knee-R DG 4 views:  Results for orders placed in visit on 10/24/17  DG Knee Complete 4 Views Right   Narrative CLINICAL DATA:  Chronic RIGHT hip and knee pain for several years, no known injury or surgery  EXAM: RIGHT KNEE - COMPLETE 4+ VIEW  COMPARISON:  None  FINDINGS: Osseous mineralization normal.  Joint spaces preserved.  No fracture, dislocation, or bone destruction.  No joint effusion.  IMPRESSION: Normal exam.   Electronically Signed   By: Lavonia Dana M.D.   On:  10/25/2017 08:07     Complexity Note: Imaging results reviewed. Results shared with Mr. Molchan, using Layman's terms. Today I personally and independently reviewed the study images pertinent to Mr. Milnes's problem.                  Meds   Current Outpatient Medications:  .  buPROPion (WELLBUTRIN XL) 300 MG 24 hr tablet, Take 300 mg by mouth daily. , Disp: , Rfl:  .  busPIRone (BUSPAR) 30 MG tablet, 2 (two) times daily. , Disp: , Rfl:  .  clomiPHENE (CLOMID) 50 MG tablet, Take 1 tablet (50 mg total) by mouth every other day., Disp: 60 tablet, Rfl: 0 .  clonazePAM (KLONOPIN) 0.5 MG tablet, , Disp: , Rfl:  .  fluticasone (FLONASE) 50 MCG/ACT nasal spray, PLACE 2 SPRAYS INTO EACH NOSTRIL QD, Disp: , Rfl:  .  lamoTRIgine (LAMICTAL) 200 MG tablet, Take by mouth daily. , Disp: , Rfl:  .  lithium carbonate 300 MG capsule, Take 300 mg by mouth daily. , Disp: , Rfl:  .  Melatonin 5 MG CAPS, , Disp: , Rfl:  .  Multiple Vitamins-Minerals (MULTIVITAMIN MEN) TABS, Take 1 tablet by mouth daily., Disp: , Rfl:  .  pantoprazole (PROTONIX) 40 MG tablet, Take  1 tablet (40 mg total) by mouth daily. 30 minutes before food, Disp: 90 tablet, Rfl: 1 .  sildenafil (REVATIO) 20 MG tablet, Take 40 mg by mouth., Disp: , Rfl:  .  SUMAtriptan (IMITREX) 100 MG tablet, , Disp: , Rfl:  .  traZODone (DESYREL) 100 MG tablet, Take 200 mg by mouth at bedtime., Disp: , Rfl:  .  acetaminophen (TYLENOL) 500 MG tablet, Take 500 mg by mouth every 6 (six) hours as needed., Disp: , Rfl:  .  albuterol (PROVENTIL HFA) 108 (90 Base) MCG/ACT inhaler, Inhale into the lungs., Disp: , Rfl:   ROS  Constitutional: Denies any fever or chills Gastrointestinal: No reported hemesis, hematochezia, vomiting, or acute GI distress Musculoskeletal: Denies any acute onset joint swelling, redness, loss of ROM, or weakness Neurological: No reported episodes of acute onset apraxia, aphasia, dysarthria, agnosia, amnesia, paralysis, loss of  coordination, or loss of consciousness  Allergies  Mr. Sainvil has No Known Allergies.  PFSH  Drug: Mr. Bertz  reports that he does not use drugs. Alcohol:  reports that he does not drink alcohol. Tobacco:  reports that he quit smoking about 3 years ago. His smoking use included cigarettes. He has never used smokeless tobacco. Medical:  has a past medical history of Bipolar disorder ([place]), Blood in stool, Depression, Drug abuse in remission, Fatty liver (01/2018), H/O alcohol abuse, Headache, Hypertension, and Low testosterone in male. Surgical: Mr. Ehrmann  has a past surgical history that includes Appendectomy; Colonoscopy with propofol (N/A, 12/30/2017); and Esophagogastroduodenoscopy (egd) with propofol (N/A, 12/30/2017). Family: family history includes Alcohol abuse in his father; COPD in his father; Cancer in his father; Depression in his mother; Hyperlipidemia in his father; Hypertension in his father.  Constitutional Exam  General appearance: Well nourished, well developed, and well hydrated. In no apparent acute distress Vitals:   01/08/18 1157 01/08/18 1159  BP:  120/77  Pulse: (!) 58   Resp: 16   Temp: 98.7 F (37.1 C)   SpO2: 98%   Weight: 200 lb (90.7 kg)   Height: '5\' 11"'$  (1.803 m)    BMI Assessment: Estimated body mass index is 27.89 kg/m as calculated from the following:   Height as of this encounter: '5\' 11"'$  (1.803 m).   Weight as of this encounter: 200 lb (90.7 kg).  BMI interpretation table: BMI level Category Range association with higher incidence of chronic pain  <18 kg/m2 Underweight   18.5-24.9 kg/m2 Ideal body weight   25-29.9 kg/m2 Overweight Increased incidence by 20%  30-34.9 kg/m2 Obese (Class I) Increased incidence by 68%  35-39.9 kg/m2 Severe obesity (Class II) Increased incidence by 136%  >40 kg/m2 Extreme obesity (Class III) Increased incidence by 254%   BMI Readings from Last 4 Encounters:  01/08/18 27.89 kg/m  12/30/17 28.59 kg/m  12/17/17  29.99 kg/m  12/10/17 30.38 kg/m   Wt Readings from Last 4 Encounters:  01/08/18 200 lb (90.7 kg)  12/30/17 205 lb (93 kg)  12/17/17 215 lb (97.5 kg)  12/10/17 217 lb 12.8 oz (98.8 kg)  Psych/Mental status: Alert, oriented x 3 (person, place, & time)       Eyes: PERLA Respiratory: No evidence of acute respiratory distress  Cervical Spine Area Exam  Skin & Axial Inspection: No masses, redness, edema, swelling, or associated skin lesions Alignment: Symmetrical Functional ROM: Decreased ROM, bilaterally Stability: No instability detected Muscle Tone/Strength: Functionally intact. No obvious neuro-muscular anomalies detected. Sensory (Neurological): Arthropathic arthralgia Palpation: Complains of area being tender to palpation Positive  provocative maneuver for for cervical facet disease  Upper Extremity (UE) Exam    Side: Right upper extremity  Side: Left upper extremity  Skin & Extremity Inspection: Skin color, temperature, and hair growth are WNL. No peripheral edema or cyanosis. No masses, redness, swelling, asymmetry, or associated skin lesions. No contractures.  Skin & Extremity Inspection: Skin color, temperature, and hair growth are WNL. No peripheral edema or cyanosis. No masses, redness, swelling, asymmetry, or associated skin lesions. No contractures.  Functional ROM: Unrestricted ROM          Functional ROM: Unrestricted ROM          Muscle Tone/Strength: Functionally intact. No obvious neuro-muscular anomalies detected.  Muscle Tone/Strength: Functionally intact. No obvious neuro-muscular anomalies detected.  Sensory (Neurological): Unimpaired          Sensory (Neurological): Unimpaired          Palpation: No palpable anomalies              Palpation: No palpable anomalies              Specialized Test(s): Deferred         Specialized Test(s): Deferred          Thoracic Spine Area Exam  Skin & Axial Inspection: No masses, redness, or swelling Alignment:  Symmetrical Functional ROM: Unrestricted ROM Stability: No instability detected Muscle Tone/Strength: Functionally intact. No obvious neuro-muscular anomalies detected. Sensory (Neurological): Unimpaired Muscle strength & Tone: No palpable anomalies  Lumbar Spine Area Exam  Skin & Axial Inspection: No masses, redness, or swelling Alignment: Symmetrical Functional ROM: Unrestricted ROM      Stability: No instability detected Muscle Tone/Strength: Functionally intact. No obvious neuro-muscular anomalies detected. Sensory (Neurological): Unimpaired Palpation: No palpable anomalies       Provocative Tests: Lumbar Hyperextension and rotation test: evaluation deferred today       Lumbar Lateral bending test: evaluation deferred today       Patrick's Maneuver: evaluation deferred today                    Gait & Posture Assessment  Ambulation: Unassisted Gait: Relatively normal for age and body habitus Posture: WNL   Lower Extremity Exam    Side: Right lower extremity  Side: Left lower extremity  Skin & Extremity Inspection: Skin color, temperature, and hair growth are WNL. No peripheral edema or cyanosis. No masses, redness, swelling, asymmetry, or associated skin lesions. No contractures.  Skin & Extremity Inspection: Skin color, temperature, and hair growth are WNL. No peripheral edema or cyanosis. No masses, redness, swelling, asymmetry, or associated skin lesions. No contractures.  Functional ROM: Unrestricted ROM          Functional ROM: Unrestricted ROM          Muscle Tone/Strength: Functionally intact. No obvious neuro-muscular anomalies detected.  Muscle Tone/Strength: Functionally intact. No obvious neuro-muscular anomalies detected.  Sensory (Neurological): Unimpaired  Sensory (Neurological): Unimpaired  Palpation: No palpable anomalies  Palpation: No palpable anomalies   Assessment & Plan  Primary Diagnosis & Pertinent Problem List: The primary encounter diagnosis was Cervical  spondylosis without myelopathy (C4,5,6,7; L>R). Diagnoses of Cervical facet joint syndrome, DDD (degenerative disc disease), cervical, Cervicalgia, and Chronic pain syndrome were also pertinent to this visit.  Visit Diagnosis: 1. Cervical spondylosis without myelopathy (C4,5,6,7; L>R)   2. Cervical facet joint syndrome   3. DDD (degenerative disc disease), cervical   4. Cervicalgia   5. Chronic pain syndrome    Problems  updated and reviewed during this visit: Problem  Cervical spondylosis without myelopathy (C4,5,6,7; L>R)  Cervical Facet Joint Syndrome  Ddd (Degenerative Disc Disease), Cervical   General Recommendations: The pain condition that the patient suffers from is best treated with a multidisciplinary approach that involves an increase in physical activity to prevent de-conditioning and worsening of the pain cycle, as well as psychological counseling (formal and/or informal) to address the co-morbid psychological affects of pain. Treatment will often involve judicious use of pain medications and interventional procedures to decrease the pain, allowing the patient to participate in the physical activity that will ultimately produce long-lasting pain reductions. The goal of the multidisciplinary approach is to return the patient to a higher level of overall function and to restore their ability to perform activities of daily living.  42 year old male who presents with a chief complaint of neck pain that radiates to his right shoulder and down his right arm along with axial low back pain as well as right hip pain.  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  This was in 2016.  Patient moved from [place] approximately 3-1/2 years ago.  Patient states that he has had cervical facet medial branch nerve blocks in [place] which were effective  for his neck pain.  He has not had any cervical radiofrequency ablations performed.  Patient has had cervical MRI repeated on 12/25/2017 which shows cervical spondylosis, cervical degenerative disc disease, cervical facet arthropathy most pronounced at C4, 5, 6, 7; left greater than right.  Patient also has foraminal narrowing at C5 or C6, left greater than right.  No central canal stenosis shown.  For cervical spondylosis, we discussed diagnostic cervical facet medial branch nerve blocks.  The patient has had these performed in the past in [place] and he did find significant benefit from these.  We will proceed with repeat diagnostic block #1 here and we discussed following up with radiofrequency ablation pending results from his 2 diagnostic blocks.  Since the patient is having an acute on chronic pain exacerbation, we discussed intramuscular Toradol and Norflex 60 mg respectively.  Patient's kidney function within normal limits.  Plan: -Schedule for bilateral C4, 5, 6, 7 cervical facet medial branch nerve blocks with sedation.  Consider cervical facet radiofrequency ablation in the future -Intramuscular Norflex and Toradol, 60 mg respectively.   Plan of Care  Pharmacotherapy (Medications Ordered): Meds ordered this encounter  Medications  . orphenadrine (NORFLEX) injection 60 mg  . ketorolac (TORADOL) injection 60 mg   Lab-work, procedure(s), and/or referral(s): Orders Placed This Encounter  Procedures  . CERVICAL FACET (MEDIAL BRANCH NERVE BLOCK)    Provider-requested follow-up: Return in about 12 days (around 01/20/2018) for Procedure. Time Note: Greater than 50% of the 25 minute(s) of face-to-face time spent with Mr. Yost, was spent in counseling/coordination of care regarding: Mr. Valletta primary cause of pain, the results of his recent test(s), the significance of each one oth the test(s) anomalies and it's corresponding characteristic pain pattern(s), the treatment plan,  treatment alternatives, the risks and possible complications of proposed treatment, going over the informed consent and the goals of pain management (increased in functionality). Future Appointments  Date Time Provider [place]  01/21/2018  9:00 AM Lin Landsman, MD [id number] None  01/21/2018 10:00 AM [id number]  01/21/2018 11:00 AM [facility] 1  [facility]    Primary Care Physician: McLean-Scocuzza, Nino Glow, MD Location: [facility] Outpatient Pain Management Facility Note by: Gillis Santa, M.D Date: 01/08/2018; Time: 1:06 PM  There are no Patient Instructions on file for this visit.

## 2018-01-13 ENCOUNTER — Ambulatory Visit: Payer: Medicare PPO | Admitting: Student in an Organized Health Care Education/Training Program

## 2018-01-17 LAB — SURGICAL PATHOLOGY

## 2018-01-20 ENCOUNTER — Other Ambulatory Visit: Payer: Self-pay

## 2018-01-21 ENCOUNTER — Ambulatory Visit: Payer: Medicare PPO | Admitting: Gastroenterology

## 2018-01-21 ENCOUNTER — Encounter: Payer: Self-pay | Admitting: Gastroenterology

## 2018-01-21 ENCOUNTER — Ambulatory Visit
Admission: RE | Admit: 2018-01-21 | Discharge: 2018-01-21 | Disposition: A | Discharge status: Home / Self Care | Payer: Medicare PPO | Source: Ambulatory Visit | Attending: Sports Medicine | Admitting: Sports Medicine

## 2018-01-21 ENCOUNTER — Ambulatory Visit: Payer: Medicare PPO

## 2018-01-21 VITALS — BP 138/86 | HR 50 | Ht 71.0 in | Wt 210.8 lb

## 2018-01-21 DIAGNOSIS — R1013 Epigastric pain: Secondary | ICD-10-CM

## 2018-01-21 DIAGNOSIS — Z8719 Personal history of other diseases of the digestive system: Secondary | ICD-10-CM

## 2018-01-21 DIAGNOSIS — M25551 Pain in right hip: Secondary | ICD-10-CM | POA: Diagnosis present

## 2018-01-21 DIAGNOSIS — G8929 Other chronic pain: Secondary | ICD-10-CM | POA: Insufficient documentation

## 2018-01-21 MED ORDER — LIDOCAINE HCL (PF) 1 % IJ SOLN
5.0000 mL | Freq: Once | INTRAMUSCULAR | Status: AC
  Administered 2018-01-21: 5 mL
  Filled 2018-01-21: qty 5

## 2018-01-21 MED ORDER — IOPAMIDOL (ISOVUE-200) INJECTION 41%
50.0000 mL | Freq: Once | INTRAVENOUS | Status: AC | PRN
  Administered 2018-01-21: 15 mL
  Filled 2018-01-21: qty 50

## 2018-01-21 MED ORDER — GADOBENATE DIMEGLUMINE 529 MG/ML IV SOLN
5.0000 mL | Freq: Once | INTRAVENOUS | Status: AC | PRN
  Administered 2018-01-21: 0.1 mL via INTRA_ARTICULAR

## 2018-01-21 NOTE — Progress Notes (Signed)
Johnny Darby, Johnny Villa [address]  [place]  [place], [place] [address]  Main: [phone]  Fax: [phone]    Gastroenterology Consultation  Referring Provider:     McLean-Scocuzza, Johnny Villa * Primary Care Physician:  McLean-Scocuzza, Johnny Glow, Johnny Villa Primary Gastroenterologist:  Dr. Cephas Villa Reason for Consultation:     Rectal bleeding, epigastric pain, symptomatic hemorrhoids        HPI:   Johnny Villa is a 42 y.o. male referred by Dr. Terese Door, Johnny Glow, Johnny Villa  for consultation & management of anal fissure and dark stools. He was recently in the ER last week of february secondary to painful bowel movements which were dark colored. Subsequent to that his bowel movements were more and more painful with severe sharp pain right at his anus and he had bright red blood, the patient was acutely relieved after a rupture of the hemorrhoid. He has a long-standing history of arthritis and takes meloxicam on a chronic basis.  He does report constipation. He is concerned because he has history of hemorrhoids. External hemorrhoids and Anal fissure were visualized by the ER physician, his CBC was unremarkable, was discharged home on anusol cream and sitz baths. He is doing only sitz baths, could not afford Anusol cream. He reports that pain is significantly better as long as he is keeping bowel movements regular. Patient is currently on fiber supplements and stool softeners twice daily is keeping his bowels regular and formed. He reports that is the first time ever he had regular bowel movements. Generally, he is constipated, irregular bowel movements every 3-4 days, associated with significant straining. He also reports heartburn and epigastric pain for which he is taking Protonix 40 mg daily started by the ER physician. These symptoms are partially relieved. He reports stopping meloxicam since ER visit.  Follow-up visit 01/21/2018 Since last visit, patient's anal fissure has resolved with  application of topical nitroglycerin. He underwent EGD and colonoscopy which were essentially negative except for small tubular adenoma which was removed. He reports not having a good appetite. He does not have a desire to eat and feels very stressful taking care of 3 kids of his girlfriend although he enjoys having their presence. He is planning to go on a vacation in next couple of weeks. He reports having infrequent bowel movements and he thinks because he is not eating well. He denies lumpy hard stools or pushing/straining. He lost about 5 pounds in last 4 weeks due to poor appetite. Patient reports that his weight has been fluctuating over long time whenever he is stressed out and his appetite drops. He otherwise does not have any GI complaints today. He doesn't really have upper GI symptoms anymore  NSAIDs: meloxicam for arthritis  Antiplts/Anticoagulants/Anti thrombotics: none  GI Procedures: EGD and colon 12/30/17 DIAGNOSIS:  A. STOMACH; COLD BIOPSY:  - ANTRAL MUCOSA WITH SUPERFICIAL VASCULAR CONGESTION AND MILD FOVEOLAR  HYPERPLASIA.  - OXYNTIC MUCOSAL PROTON PUMP INHIBITOR EFFECT.  - NEGATIVE FOR H. PYLORI, DYSPLASIA AND MALIGNANCY.   B. COLON POLYP, ASCENDING; COLD BIOPSY:  - NO PATHOLOGIC CHANGE.   C. COLON POLYP, SIGMOID; COLD SNARE:  - TUBULAR ADENOMA.  - NEGATIVE FOR HIGH-GRADE DYSPLASIA AND MALIGNANCY.   He denies family history of GI malignancy He does not smoke or drink alcohol  Past Medical History:  Diagnosis Date  . Bipolar disorder [facility])    psychiatrist in [place]  . Blood in stool   . Depression   . Drug abuse in  remission    sober for 6 years  . Fatty liver 01/2018  . H/O alcohol abuse   . Headache    migraines  . Hypertension   . Low testosterone in male     Past Surgical History:  Procedure Laterality Date  . APPENDECTOMY     2006  . COLONOSCOPY WITH PROPOFOL N/A 12/30/2017   Procedure: COLONOSCOPY WITH PROPOFOL;  Surgeon: Johnny Landsman, Johnny Villa;   Location: [facility] ENDOSCOPY;  Service: Gastroenterology;  Laterality: N/A;  . ESOPHAGOGASTRODUODENOSCOPY (EGD) WITH PROPOFOL N/A 12/30/2017   Procedure: ESOPHAGOGASTRODUODENOSCOPY (EGD) WITH PROPOFOL;  Surgeon: Johnny Landsman, Johnny Villa;  Location: [facility] ENDOSCOPY;  Service: Gastroenterology;  Laterality: N/A;     Current Outpatient Medications:  .  acetaminophen (TYLENOL) 500 MG tablet, Take 500 mg by mouth every 6 (six) hours as needed., Disp: , Rfl:  .  albuterol (PROVENTIL HFA) 108 (90 Base) MCG/ACT inhaler, Inhale into the lungs., Disp: , Rfl:  .  buPROPion (WELLBUTRIN XL) 300 MG 24 hr tablet, Take 300 mg by mouth daily. , Disp: , Rfl:  .  busPIRone (BUSPAR) 30 MG tablet, 2 (two) times daily. , Disp: , Rfl:  .  clomiPHENE (CLOMID) 50 MG tablet, Take 1 tablet (50 mg total) by mouth every other day., Disp: 60 tablet, Rfl: 0 .  clonazePAM (KLONOPIN) 0.5 MG tablet, , Disp: , Rfl:  .  fluticasone (FLONASE) 50 MCG/ACT nasal spray, PLACE 2 SPRAYS INTO EACH NOSTRIL QD, Disp: , Rfl:  .  lamoTRIgine (LAMICTAL) 200 MG tablet, Take by mouth daily. , Disp: , Rfl:  .  lithium carbonate 300 MG capsule, Take 300 mg by mouth daily. , Disp: , Rfl:  .  Melatonin 5 MG CAPS, , Disp: , Rfl:  .  Multiple Vitamins-Minerals (MULTIVITAMIN MEN) TABS, Take 1 tablet by mouth daily., Disp: , Rfl:  .  SALINE MIST 0.65 % nasal spray, Place 1 application into both nostrils as directed., Disp: , Rfl:  .  sildenafil (REVATIO) 20 MG tablet, Take 40 mg by mouth., Disp: , Rfl:  .  SUMAtriptan (IMITREX) 100 MG tablet, , Disp: , Rfl:  .  traZODone (DESYREL) 100 MG tablet, Take 200 mg by mouth at bedtime., Disp: , Rfl:  .  Vitamins A & D 5000-400 units CAPS, , Disp: , Rfl:   Family History  Problem Relation Age of Onset  . Depression Mother   . Alcohol abuse Father   . Cancer Father        prostate dx'ed 9  . COPD Father   . Hyperlipidemia Father   . Hypertension Father      Social History   Tobacco Use  . Smoking  status: Former Smoker    Types: Cigarettes    Last attempt to quit: 2016    Years since quitting: 3.3  . Smokeless tobacco: Never Used  Substance Use Topics  . Alcohol use: No    Frequency: Never    Comment: former quit 03/14/2012  . Drug use: No    Comment: former quit cocaine 03/14/2012     Allergies as of 01/21/2018  . (No Known Allergies)    Review of Systems:    All systems reviewed and negative except where noted in HPI.   Physical Exam:  BP 138/86   Pulse (!) 50   Ht 5\' 11"  (1.803 m)   Wt 210 lb 12.8 oz (95.6 kg)   BMI 29.40 kg/m  No LMP for male patient.  General:   Alert,  Well-developed, well-nourished, pleasant and cooperative in NAD Head:  Normocephalic and atraumatic. Eyes:  Sclera clear, no icterus.   Conjunctiva pink. Ears:  Normal auditory acuity. Nose:  No deformity, discharge, or lesions. Mouth:  No deformity or lesions,oropharynx pink & moist. Neck:  Supple; no masses or thyromegaly. Lungs:  Respirations even and unlabored.  Clear throughout to auscultation.   No wheezes, crackles, or rhonchi. No acute distress. Heart:  Regular rate and rhythm; no murmurs, clicks, rubs, or gallops. Abdomen:  Normal bowel sounds. Soft, non-tender and non-distended without masses, hepatosplenomegaly or hernias noted.  No guarding or rebound tenderness.   Rectal: Not performed Msk:  Symmetrical without gross deformities. Good, equal movement & strength bilaterally. Pulses:  Normal pulses noted. Extremities:  No clubbing or edema.  No cyanosis. Neurologic:  Alert and oriented x3;  grossly normal neurologically. Skin:  Intact without significant lesions or rashes. No jaundice. Lymph Nodes:  No significant cervical adenopathy. Psych:  Alert and cooperative. Normal mood and affect.  Imaging Studies: No abdominal imaging  Assessment and Plan:   Ata Pecha is a 42 y.o. male with history of bipolar, chronic constipation presents with epigastric pain, heartburn,  symptomatic external hemorrhoids, rectal bleeding and anal fissure. It appears that he had thrombosed external hemorrhoid which spontaneously ruptured.  Epigastric pain and heartburn: likely secondary to NSAID use and in the setting of stress Symptoms have resolved - recommend to discontinue omeprazole - Continue to avoid NSAID use - EGD with biopsies, unremarkable  Symptomatic hemorrhoids/rectal bleeding: - currently in remission - Defer hemorrhoid ligation at this time  Anal fissure: resolved with topical nitroglycerin 0.125% with lidocaine. Be applied only for a couple of days. -I have advised him to use topical nitroglycerin at least for 4 weeks which will allow complete healing of the fissure and prevent recurrence - fiber supplements and stool softeners like MiraLAX or Colace to avoid constipation  Follow up as needed   Johnny Darby, Johnny Villa

## 2018-01-28 ENCOUNTER — Ambulatory Visit: Payer: Medicare PPO | Admitting: Student in an Organized Health Care Education/Training Program

## 2018-01-29 ENCOUNTER — Other Ambulatory Visit: Payer: Self-pay | Admitting: Orthopedic Surgery

## 2018-01-29 DIAGNOSIS — M25551 Pain in right hip: Principal | ICD-10-CM

## 2018-01-29 DIAGNOSIS — G8929 Other chronic pain: Secondary | ICD-10-CM

## 2018-01-29 DIAGNOSIS — K760 Fatty (change of) liver, not elsewhere classified: Secondary | ICD-10-CM

## 2018-01-29 HISTORY — DX: Fatty (change of) liver, not elsewhere classified: K76.0

## 2018-02-05 ENCOUNTER — Other Ambulatory Visit: Payer: Self-pay

## 2018-02-05 ENCOUNTER — Encounter: Payer: Self-pay | Admitting: Student in an Organized Health Care Education/Training Program

## 2018-02-05 ENCOUNTER — Ambulatory Visit (HOSPITAL_BASED_OUTPATIENT_CLINIC_OR_DEPARTMENT_OTHER): Payer: Medicare PPO | Admitting: Student in an Organized Health Care Education/Training Program

## 2018-02-05 ENCOUNTER — Ambulatory Visit
Admission: RE | Admit: 2018-02-05 | Discharge: 2018-02-05 | Disposition: A | Discharge status: Home / Self Care | Payer: Medicare PPO | Source: Ambulatory Visit | Attending: Student in an Organized Health Care Education/Training Program | Admitting: Student in an Organized Health Care Education/Training Program

## 2018-02-05 VITALS — BP 139/91 | HR 80 | Temp 98.4°F | Resp 19 | Ht 71.0 in | Wt 210.0 lb

## 2018-02-05 DIAGNOSIS — M47812 Spondylosis without myelopathy or radiculopathy, cervical region: Secondary | ICD-10-CM

## 2018-02-05 DIAGNOSIS — M542 Cervicalgia: Secondary | ICD-10-CM | POA: Diagnosis present

## 2018-02-05 MED ORDER — ROPIVACAINE HCL 2 MG/ML IJ SOLN
10.0000 mL | Freq: Once | INTRAMUSCULAR | Status: AC
  Administered 2018-02-05: 10 mL

## 2018-02-05 MED ORDER — DEXAMETHASONE SODIUM PHOSPHATE 10 MG/ML IJ SOLN
10.0000 mg | Freq: Once | INTRAMUSCULAR | Status: AC
  Administered 2018-02-05: 10 mg

## 2018-02-05 MED ORDER — ROPIVACAINE HCL 2 MG/ML IJ SOLN
INTRAMUSCULAR | Status: AC
  Filled 2018-02-05: qty 10

## 2018-02-05 MED ORDER — LIDOCAINE HCL (PF) 1 % IJ SOLN
10.0000 mL | Freq: Once | INTRAMUSCULAR | Status: AC
  Administered 2018-02-05: 10 mL

## 2018-02-05 MED ORDER — DEXAMETHASONE SODIUM PHOSPHATE 10 MG/ML IJ SOLN
INTRAMUSCULAR | Status: AC
  Filled 2018-02-05: qty 1

## 2018-02-05 MED ORDER — LIDOCAINE HCL (PF) 1 % IJ SOLN
INTRAMUSCULAR | Status: AC
  Filled 2018-02-05: qty 5

## 2018-02-05 NOTE — Progress Notes (Signed)
Safety precautions to be maintained throughout the outpatient stay will include: orient to surroundings, keep bed in low position, maintain call bell within reach at all times, provide assistance with transfer out of bed and ambulation.  

## 2018-02-05 NOTE — Patient Instructions (Signed)
Pain Management Discharge Instructions  General Discharge Instructions :  If you need to reach your doctor call: Monday-Friday 8:00 am - 4:00 pm at [phone] or toll free [phone].  After clinic hours [phone] to have operator reach doctor.  Bring all of your medication bottles to all your appointments in the pain clinic.  To cancel or reschedule your appointment with Pain Management please remember to call 24 hours in advance to avoid a fee.  Refer to the educational materials which you have been given on: General Risks, I had my Procedure. Discharge Instructions, Post Sedation.  Post Procedure Instructions:  The drugs you were given will stay in your system until tomorrow, so for the next 24 hours you should not drive, make any legal decisions or drink any alcoholic beverages.  You may eat anything you prefer, but it is better to start with liquids then soups and crackers, and gradually work up to solid foods.  Please notify your doctor immediately if you have any unusual bleeding, trouble breathing or pain that is not related to your normal pain.  Depending on the type of procedure that was done, some parts of your body may feel week and/or numb.  This usually clears up by tonight or the next day.  Walk with the use of an assistive device or accompanied by an adult for the 24 hours.  You may use ice on the affected area for the first 24 hours.  Put ice in a Ziploc bag and cover with a towel and place against area 15 minutes on 15 minutes off.  You may switch to heat after 24 hours.Facet Blocks Patient Information  Description: The facets are joints in the spine between the vertebrae.  Like any joints in the body, facets can become irritated and painful.  Arthritis can also effect the facets.  By injecting steroids and local anesthetic in and around these joints, we can temporarily block the nerve supply to them.  Steroids act directly on irritated nerves and tissues to  reduce selling and inflammation which often leads to decreased pain.  Facet blocks may be done anywhere along the spine from the neck to the low back depending upon the location of your pain.   After numbing the skin with local anesthetic (like Novocaine), a small needle is passed onto the facet joints under x-ray guidance.  You may experience a sensation of pressure while this is being done.  The entire block usually lasts about 15-25 minutes.   Conditions which may be treated by facet blocks:   Low back/buttock pain  Neck/shoulder pain  Certain types of headaches  Preparation for the injection:  1. Do not eat any solid food or dairy products within 8 hours of your appointment. 2. You may drink clear liquid up to 3 hours before appointment.  Clear liquids include water, black coffee, juice or soda.  No milk or cream please. 3. You may take your regular medication, including pain medications, with a sip of water before your appointment.  Diabetics should hold regular insulin (if taken separately) and take 1/2 normal NPH dose the morning of the procedure.  Carry some sugar containing items with you to your appointment. 4. A driver must accompany you and be prepared to drive you home after your procedure. 5. Bring all your current medications with you. 6. An IV may be inserted and sedation may be given at the discretion of the physician. 7. A blood pressure cuff, EKG and other monitors will often be   applied during the procedure.  Some patients may need to have extra oxygen administered for a short period. 8. You will be asked to provide medical information, including your allergies and medications, prior to the procedure.  We must know immediately if you are taking blood thinners (like Coumadin/Warfarin) or if you are allergic to IV iodine contrast (dye).  We must know if you could possible be pregnant.  Possible side-effects:   Bleeding from needle site  Infection (rare, may require  surgery)  Nerve injury (rare)  Numbness & tingling (temporary)  Difficulty urinating (rare, temporary)  Spinal headache (a headache worse with upright posture)  Light-headedness (temporary)  Pain at injection site (serveral days)  Decreased blood pressure (rare, temporary)  Weakness in arm/leg (temporary)  Pressure sensation in back/neck (temporary)   Call if you experience:   Fever/chills associated with headache or increased back/neck pain  Headache worsened by an upright position  New onset, weakness or numbness of an extremity below the injection site  Hives or difficulty breathing (go to the emergency room)  Inflammation or drainage at the injection site(s)  Severe back/neck pain greater than usual  New symptoms which are concerning to you  Please note:  Although the local anesthetic injected can often make your back or neck feel good for several hours after the injection, the pain will likely return. It takes 3-7 days for steroids to work.  You may not notice any pain relief for at least one week.  If effective, we will often do a series of 2-3 injections spaced 3-6 weeks apart to maximally decrease your pain.  After the initial series, you may be a candidate for a more permanent nerve block of the facets.  If you have any questions, please call [phone] [facility] 

## 2018-02-05 NOTE — Progress Notes (Signed)
Patient's Name: Johnny Villa  MRN: [id number]  Referring Provider: Orland Mustard *  DOB: 1976-04-24  PCP: McLean-Scocuzza, Nino Glow, MD  DOS: 02/05/2018  Note by: Gillis Santa, MD  Service setting: Ambulatory outpatient  Specialty: Interventional Pain Management  Patient type: Established  Location: [facility] (AMB) Pain Management Facility  Visit type: Interventional Procedure   Primary Reason for Visit: Interventional Pain Management Treatment. CC: Procedure (cevical facet block ) and Neck Pain  Procedure:       Anesthesia, Analgesia, Anxiolysis:  Type: Cervical Facet Medial Branch Block(s)          Primary Purpose: Diagnostic Region: Posterolateral cervical spine Level:  C4, C5, C6, & C7 Medial Branch Level(s). Injecting these levels blocks the  C4-5, C5-6, and C6-7 cervical facet joints. Laterality: Bilateral Paraspinal  Type: Local Anesthesia Indication(s): Analgesia and Anxiety Route: Infiltration ([place]/IM) IV Access: Declined Sedation: None since a driver was not available  Local Anesthetic: Lidocaine 1%   Indications: 1. Cervical spondylosis without myelopathy (C4,5,6,7; L>R)   2. Cervical facet joint syndrome    Pain Score: Pre-procedure: 8 /10 Post-procedure: 8 /10  Pre-op Assessment:  Mr. Scheier is a 42 y.o. (year old), male patient, seen today for interventional treatment. He  has a past surgical history that includes Appendectomy; Colonoscopy with propofol (N/A, 12/30/2017); and Esophagogastroduodenoscopy (egd) with propofol (N/A, 12/30/2017). Mr. Bonelli has a current medication list which includes the following prescription(s): aspirin-acetaminophen-caffeine, buspirone, clomiphene, clonazepam, duloxetine, fluticasone, lamotrigine, lithium carbonate, melatonin, multivitamin men, saline mist, sildenafil, sumatriptan, trazodone, vitamins a & d, acetaminophen, albuterol, and bupropion. His primarily concern today is the Procedure (cevical facet block ) and Neck Pain  Initial  Vital Signs:  Pulse/HCG Rate: 80ECG Heart Rate: 61 Temp: 98.4 F (36.9 C) Resp: 18 BP: 139/81 SpO2: 99 %  BMI: Estimated body mass index is 29.29 kg/m as calculated from the following:   Height as of this encounter: 5\' 11"  (1.803 m).   Weight as of this encounter: 210 lb (95.3 kg).  Risk Assessment: Allergies: Reviewed. He has No Known Allergies.  Allergy Precautions: None required Coagulopathies: Reviewed. None identified.  Blood-thinner therapy: None at this time Active Infection(s): Reviewed. None identified. Mr. Lukins is afebrile  Site Confirmation: Mr. Quintela was asked to confirm the procedure and laterality before marking the site Procedure checklist: Completed Consent: Before the procedure and under the influence of no sedative(s), amnesic(s), or anxiolytics, the patient was informed of the treatment options, risks and possible complications. To fulfill our ethical and legal obligations, as recommended by the American Medical Association's Code of Ethics, I have informed the patient of my clinical impression; the nature and purpose of the treatment or procedure; the risks, benefits, and possible complications of the intervention; the alternatives, including doing nothing; the risk(s) and benefit(s) of the alternative treatment(s) or procedure(s); and the risk(s) and benefit(s) of doing nothing. The patient was provided information about the general risks and possible complications associated with the procedure. These may include, but are not limited to: failure to achieve desired goals, infection, bleeding, organ or nerve damage, allergic reactions, paralysis, and death. In addition, the patient was informed of those risks and complications associated to Spine-related procedures, such as failure to decrease pain; infection (i.e.: Meningitis, epidural or intraspinal abscess); bleeding (i.e.: epidural hematoma, subarachnoid hemorrhage, or any other type of intraspinal or peri-dural  bleeding); organ or nerve damage (i.e.: Any type of peripheral nerve, nerve root, or spinal cord injury) with subsequent damage to sensory, motor, and/or autonomic systems,  resulting in permanent pain, numbness, and/or weakness of one or several areas of the body; allergic reactions; (i.e.: anaphylactic reaction); and/or death. Furthermore, the patient was informed of those risks and complications associated with the medications. These include, but are not limited to: allergic reactions (i.e.: anaphylactic or anaphylactoid reaction(s)); adrenal axis suppression; blood sugar elevation that in diabetics may result in ketoacidosis or comma; water retention that in patients with history of congestive heart failure may result in shortness of breath, pulmonary edema, and decompensation with resultant heart failure; weight gain; swelling or edema; medication-induced neural toxicity; particulate matter embolism and blood vessel occlusion with resultant organ, and/or nervous system infarction; and/or aseptic necrosis of one or more joints. Finally, the patient was informed that Medicine is not an exact science; therefore, there is also the possibility of unforeseen or unpredictable risks and/or possible complications that may result in a catastrophic outcome. The patient indicated having understood very clearly. We have given the patient no guarantees and we have made no promises. Enough time was given to the patient to ask questions, all of which were answered to the patient's satisfaction. Mr. Rode has indicated that he wanted to continue with the procedure. Attestation: I, the ordering provider, attest that I have discussed with the patient the benefits, risks, side-effects, alternatives, likelihood of achieving goals, and potential problems during recovery for the procedure that I have provided informed consent. Date  Time: 02/05/2018  9:48 AM  Pre-Procedure Preparation:  Monitoring: As per clinic protocol.  Respiration, ETCO2, SpO2, BP, heart rate and rhythm monitor placed and checked for adequate function Safety Precautions: Patient was assessed for positional comfort and pressure points before starting the procedure. Time-out: I initiated and conducted the "Time-out" before starting the procedure, as per protocol. The patient was asked to participate by confirming the accuracy of the "Time Out" information. Verification of the correct person, site, and procedure were performed and confirmed by me, the nursing staff, and the patient. "Time-out" conducted as per Joint Commission's Universal Protocol (UP.01.01.01). Time: 1025  Description of Procedure:       Position: Prone with head of the table raised to facilitate breathing. Laterality: Bilateral. The procedure was performed in identical fashion on both sides. Level: C4, C5, C6, & C7 Medial Branch Level(s). Area Prepped: Posterior Cervico-thoracic Region Prepping solution: ChloraPrep (2% chlorhexidine gluconate and 70% isopropyl alcohol) Safety Precautions: Aspiration looking for blood return was conducted prior to all injections. At no point did we inject any substances, as a needle was being advanced. Before injecting, the patient was told to immediately notify me if he was experiencing any new onset of "ringing in the ears, or metallic taste in the mouth". No attempts were made at seeking any paresthesias. Safe injection practices and needle disposal techniques used. Medications properly checked for expiration dates. SDV (single dose vial) medications used. After the completion of the procedure, all disposable equipment used was discarded in the proper designated medical waste containers. Local Anesthesia: Protocol guidelines were followed. The patient was positioned over the fluoroscopy table. The area was prepped in the usual manner. The time-out was completed. The target area was identified using fluoroscopy. A 12-in long, straight, sterile hemostat  was used with fluoroscopic guidance to locate the targets for each level blocked. Once located, the skin was marked with an approved surgical skin marker. Once all sites were marked, the skin (epidermis, dermis, and hypodermis), as well as deeper tissues (fat, connective tissue and muscle) were infiltrated with a small amount of a  short-acting local anesthetic, loaded on a 10cc syringe with a 25G, 1.5-in  Needle. An appropriate amount of time was allowed for local anesthetics to take effect before proceeding to the next step. Local Anesthetic: Lidocaine 1.0% The unused portion of the local anesthetic was discarded in the proper designated containers. Technical explanation of process:   C4 Medial Branch Nerve Block (MBB): The target area for the C4 dorsal medial articular branch is the lateral concave waist of the articular pillar of C4. Under fluoroscopic guidance, a Quincke needle was inserted until contact was made with os over the postero-lateral aspect of the articular pillar of C4 (target area). After negative aspiration for blood,1 mL of the nerve block solution was injected without difficulty or complication. The needle was removed intact. C5 Medial Branch Nerve Block (MBB): The target area for the C5 dorsal medial articular branch is the lateral concave waist of the articular pillar of C5. Under fluoroscopic guidance, a Quincke needle was inserted until contact was made with os over the postero-lateral aspect of the articular pillar of C5 (target area). After negative aspiration for blood, 21mL of the nerve block solution was injected without difficulty or complication. The needle was removed intact. C6 Medial Branch Nerve Block (MBB): The target area for the C6 dorsal medial articular branch is the lateral concave waist of the articular pillar of C6. Under fluoroscopic guidance, a Quincke needle was inserted until contact was made with os over the postero-lateral aspect of the articular pillar of C6  (target area). After negative aspiration for blood, 1 mL of the nerve block solution was injected without difficulty or complication. The needle was removed intact. C7 Medial Branch Nerve Block (MBB): The target for the C7 dorsal medial articular branch lies on the superior-medial tip of the C7 transverse process. Under fluoroscopic guidance, a Quincke needle was inserted until contact was made with os over the postero-lateral aspect of the articular pillar of C7 (target area). After negative aspiration for blood, 12mL of the nerve block solution was injected without difficulty or complication. The needle was removed intact. Procedural Needles: 25-gauge, 3.5-inch, Quincke needles used for all levels. Nerve block solution: 5 cc solution made of 4 cc of 0.2% ropivacaine, 1 cc of Decadron 10 mg/cc.  Approximately 1.2 cc injected at each level.  The unused portion of the solution was discarded in the proper designated containers.  Once the entire procedure was completed, the treated area was cleaned, making sure to leave some of the prepping solution back to take advantage of its long term bactericidal properties.  Vitals:   02/05/18 1037 02/05/18 1042 02/05/18 1047 02/05/18 1051  BP: 126/82 133/86 131/81 (!) 139/91  Pulse:      Resp: 14 16 19 19   Temp:      SpO2: 95% 95% 95% 99%  Weight:      Height:        Start Time: 1025 hrs. End Time: 1050 hrs.  Imaging Guidance (Spinal):  Type of Imaging Technique: Fluoroscopy Guidance (Spinal) Indication(s): Assistance in needle guidance and placement for procedures requiring needle placement in or near specific anatomical locations not easily accessible without such assistance. Exposure Time: Please see nurses notes. Contrast: None used. Fluoroscopic Guidance: I was personally present during the use of fluoroscopy. "Tunnel Vision Technique" used to obtain the best possible view of the target area. Parallax error corrected before commencing the procedure.  "Direction-depth-direction" technique used to introduce the needle under continuous pulsed fluoroscopy. Once target was reached, antero-posterior, oblique,  and lateral fluoroscopic projection used confirm needle placement in all planes. Images permanently stored in EMR. Interpretation: No contrast injected. I personally interpreted the imaging intraoperatively. Adequate needle placement confirmed in multiple planes. Permanent images saved into the patient's record.  Antibiotic Prophylaxis:   Anti-infectives (From admission, onward)   None     Indication(s): None identified  Post-operative Assessment:  Post-procedure Vital Signs:  Pulse/HCG Rate: 8068 Temp: 98.4 F (36.9 C) Resp: 19 BP: (!) 139/91 SpO2: 99 %  EBL: None  Complications: No immediate post-treatment complications observed by team, or reported by patient.  Note: The patient tolerated the entire procedure well. A repeat set of vitals were taken after the procedure and the patient was kept under observation following institutional policy, for this type of procedure. Post-procedural neurological assessment was performed, showing return to baseline, prior to discharge. The patient was provided with post-procedure discharge instructions, including a section on how to identify potential problems. Should any problems arise concerning this procedure, the patient was given instructions to immediately contact us, at any time, without hesitation. In any case, we plan to contact the patient by telephone for a follow-up status report regarding this interventional procedure.  Comments:  No additional relevant information. 5 out of 5 strength bilateral upper extremity: Shoulder abduction, elbow flexion, elbow extension, thumb extension.  Plan of Care   Imaging Orders     DG C-Arm 1-60 Min-No Report Procedure Orders    No procedure(s) ordered today   Patient endorsing improved range of motion after block.  Medications ordered for  procedure: Meds ordered this encounter  Medications  . lidocaine (PF) (XYLOCAINE) 1 % injection 10 mL  . ropivacaine (PF) 2 mg/mL (0.2%) (NAROPIN) injection 10 mL  . dexamethasone (DECADRON) injection 10 mg   Medications administered: We administered lidocaine (PF), ropivacaine (PF) 2 mg/mL (0.2%), and dexamethasone.  See the medical record for exact dosing, route, and time of administration.  New Prescriptions   No medications on file   Disposition: Discharge home  Discharge Date & Time: 02/05/2018; 1100 hrs.   Physician-requested Follow-up: Return in about 3 weeks (around 02/26/2018) for Post Procedure Evaluation.  Future Appointments  Date Time Provider [place]  02/21/2018 10:00 AM [facility]  02/26/2018  2:00 PM Gillis Santa, MD [facility] None   Primary Care Physician: McLean-Scocuzza, Nino Glow, MD Location: [facility] Note by: Gillis Santa, MD Date: 02/05/2018; Time: 1:46 PM  Disclaimer:  Medicine is not an exact science. The only guarantee in medicine is that nothing is guaranteed. It is important to note that the decision to proceed with this intervention was based on the information collected from the patient. The Data and conclusions were drawn from the patient's questionnaire, the interview, and the physical examination. Because the information was provided in large part by the patient, it cannot be guaranteed that it has not been purposely or unconsciously manipulated. Every effort has been made to obtain as much relevant data as possible for this evaluation. It is important to note that the conclusions that lead to this procedure are derived in large part from the available data. Always take into account that the treatment will also be dependent on availability of resources and existing treatment guidelines, considered by other Pain Management Practitioners as being common knowledge and practice, at the time of the intervention.  For Medico-Legal purposes, it is also important to point out that variation in procedural techniques and pharmacological choices are the acceptable norm. The indications, contraindications,  technique, and results of the above procedure should only be interpreted and judged by a Board-Certified Interventional Pain Specialist with extensive familiarity and expertise in the same exact procedure and technique.

## 2018-02-11 ENCOUNTER — Telehealth: Payer: Self-pay

## 2018-02-11 NOTE — Telephone Encounter (Signed)
Copied from [place] #[id number]. Topic: Appointment Scheduling - Scheduling Inquiry for Clinic >> Feb 11, 2018 10:00 AM Synthia Innocent wrote: Reason for CRM: Requesting to be seen asap for insect/bug bite, red, swollen and painful, offered another location, patient declined. Please advise Called and informed patient that there was no availability today and that he could go to [facility] walk in. Patient was in agreement with that.

## 2018-02-21 ENCOUNTER — Other Ambulatory Visit: Payer: Self-pay | Admitting: Orthopedic Surgery

## 2018-02-21 ENCOUNTER — Ambulatory Visit
Admission: RE | Admit: 2018-02-21 | Discharge: 2018-02-21 | Disposition: A | Discharge status: Home / Self Care | Payer: Medicare PPO | Source: Ambulatory Visit | Attending: Orthopedic Surgery | Admitting: Orthopedic Surgery

## 2018-02-21 DIAGNOSIS — G8929 Other chronic pain: Secondary | ICD-10-CM | POA: Diagnosis present

## 2018-02-21 DIAGNOSIS — M25551 Pain in right hip: Secondary | ICD-10-CM | POA: Diagnosis present

## 2018-02-26 ENCOUNTER — Encounter: Payer: Self-pay | Admitting: Student in an Organized Health Care Education/Training Program

## 2018-02-26 ENCOUNTER — Ambulatory Visit
Payer: Medicare PPO | Attending: Student in an Organized Health Care Education/Training Program | Admitting: Student in an Organized Health Care Education/Training Program

## 2018-02-26 ENCOUNTER — Other Ambulatory Visit: Payer: Self-pay

## 2018-02-26 VITALS — BP 124/76 | HR 62 | Temp 98.2°F | Resp 16 | Ht 71.0 in | Wt 210.0 lb

## 2018-02-26 DIAGNOSIS — I10 Essential (primary) hypertension: Secondary | ICD-10-CM | POA: Diagnosis not present

## 2018-02-26 DIAGNOSIS — G894 Chronic pain syndrome: Secondary | ICD-10-CM | POA: Insufficient documentation

## 2018-02-26 DIAGNOSIS — F319 Bipolar disorder, unspecified: Secondary | ICD-10-CM | POA: Diagnosis not present

## 2018-02-26 DIAGNOSIS — M5030 Other cervical disc degeneration, unspecified cervical region: Secondary | ICD-10-CM | POA: Diagnosis not present

## 2018-02-26 DIAGNOSIS — Z79899 Other long term (current) drug therapy: Secondary | ICD-10-CM | POA: Diagnosis not present

## 2018-02-26 DIAGNOSIS — M542 Cervicalgia: Secondary | ICD-10-CM

## 2018-02-26 DIAGNOSIS — M47812 Spondylosis without myelopathy or radiculopathy, cervical region: Secondary | ICD-10-CM

## 2018-02-26 DIAGNOSIS — Z87891 Personal history of nicotine dependence: Secondary | ICD-10-CM | POA: Diagnosis not present

## 2018-02-26 DIAGNOSIS — Z7982 Long term (current) use of aspirin: Secondary | ICD-10-CM | POA: Diagnosis not present

## 2018-02-26 MED ORDER — OXYCODONE HCL 5 MG PO TABS: 5.0000 mg | ORAL_TABLET | Freq: Two times a day (BID) | ORAL | 0 refills | Status: DC | PRN

## 2018-02-26 NOTE — Progress Notes (Signed)
Patient's Name: Johnny Villa  MRN: [id number]  Referring Provider: Orland Mustard *  DOB: 1976-07-05  PCP: McLean-Scocuzza, Nino Glow, MD  DOS: 02/26/2018  Note by: Gillis Santa, MD  Service setting: Ambulatory outpatient  Specialty: Interventional Pain Management  Location: [facility] (AMB) Pain Management Facility    Patient type: Established   Primary Reason(s) for Visit: Encounter for post-procedure evaluation of chronic illness with mild to moderate exacerbation CC: Neck Pain  HPI  Johnny Villa is a 42 y.o. year old, male patient, who comes today for a post-procedure evaluation. He has Back pain; Right hip pain; Knee pain, right; Hypogonadism in male; Low testosterone; Erectile dysfunction; Bipolar 1 disorder ([place]); Epigastric pain; Anal fissure; Blood in stool; Hemorrhoids; Cervicalgia; Chronic right shoulder pain; Cervical spondylosis without myelopathy (C4,5,6,7; L>R); Cervical facet joint syndrome; and DDD (degenerative disc disease), cervical on their problem list. His primarily concern today is the Neck Pain  Pain Assessment: Location: Right Neck Radiating: right arm down to the fingers. Onset: More than a month ago Duration: Chronic pain Quality: Aching, Burning, Shooting, Throbbing Severity: 8 /10 (subjective, self-reported pain score)  Note: Reported level is inconsistent with clinical observations.                         When using our objective Pain Scale, levels between 6 and 10/10 are said to belong in an emergency room, as it progressively worsens from a 6/10, described as severely limiting, requiring emergency care not usually available at an outpatient pain management facility. At a 6/10 level, communication becomes difficult and requires great effort. Assistance to reach the emergency department may be required. Facial flushing and profuse sweating along with potentially dangerous increases in heart rate and blood pressure will be evident. Effect on ADL: limited, cant drive  well. Timing: Constant Modifying factors: nothing BP: 124/76  HR: 62  Johnny Villa comes in today for post-procedure evaluation after the treatment done on 02/05/2018.  Further details on both, my assessment(s), as well as the proposed treatment plan, please see below.  Post-Procedure Assessment  02/05/2018 Procedure: Bilateral C4, 5, 6, 7 cervical facet medial branch nerve blocks Pre-procedure pain score:  8/10 Post-procedure pain score: 8/10         Influential Factors: BMI: 29.29 kg/m Intra-procedural challenges: None observed.         Assessment challenges: None detected.              Reported side-effects: None.        Post-procedural adverse reactions or complications: None reported         Sedation: Please see nurses note. When no sedatives are used, the analgesic levels obtained are directly associated to the effectiveness of the local anesthetics. However, when sedation is provided, the level of analgesia obtained during the initial 1 hour following the intervention, is believed to be the result of a combination of factors. These factors may include, but are not limited to: 1. The effectiveness of the local anesthetics used. 2. The effects of the analgesic(s) and/or anxiolytic(s) used. 3. The degree of discomfort experienced by the patient at the time of the procedure. 4. The patients ability and reliability in recalling and recording the events. 5. The presence and influence of possible secondary gains and/or psychosocial factors. Reported result: Relief experienced during the 1st hour after the procedure: 80%(Ultra-Short Term Relief)            Interpretative annotation: Clinically appropriate result. Analgesia during this period  is likely to be Local Anesthetic and/or IV Sedative (Analgesic/Anxiolytic) related.          Effects of local anesthetic: The analgesic effects attained during this period are directly associated to the localized infiltration of local anesthetics and  therefore cary significant diagnostic value as to the etiological location, or anatomical origin, of the pain. Expected duration of relief is directly dependent on the pharmacodynamics of the local anesthetic used. Long-acting (4-6 hours) anesthetics used.  Reported result: Relief during the next 4 to 6 hour after the procedure:80%(Short-Term Relief)            Interpretative annotation: Clinically appropriate result. Analgesia during this period is likely to be Local Anesthetic-related.          Long-term benefit: Defined as the period of time past the expected duration of local anesthetics (1 hour for short-acting and 4-6 hours for long-acting). With the possible exception of prolonged sympathetic blockade from the local anesthetics, benefits during this period are typically attributed to, or associated with, other factors such as analgesic sensory neuropraxia, antiinflammatory effects, or beneficial biochemical changes provided by agents other than the local anesthetics.  Reported result: Extended relief following procedure:40-50% (Long-Term Relief)            Interpretative annotation: Clinically appropriate result. Good relief. No permanent benefit expected. Inflammation plays a part in the etiology to the pain.          Current benefits: Defined as reported results that persistent at this point in time.   Analgesia: 25-50 %            Function: Somewhat improved ROM: Somewhat improved Interpretative annotation: Recurrence of symptoms. No permanent benefit expected. Effective diagnostic intervention.          Interpretation: Results would suggest a successful diagnostic intervention. We'll proceed with diagnostic intervention #2, as soon as convenient          Plan:  Repeat treatment or therapy and compare extent and duration of benefits.                Laboratory Chemistry  Inflammation Markers (CRP: Acute Phase) (ESR: Chronic Phase) Lab Results  Component Value Date   CRP 1.0 10/25/2017    ESRSEDRATE 5 10/25/2017                         Rheumatology Markers Lab Results  Component Value Date   RF <10.0 10/25/2017   LABURIC 4.4 10/25/2017                        Renal Function Markers Lab Results  Component Value Date   BUN 15 11/24/2017   CREATININE 1.04 11/24/2017   BCR 15 10/25/2017   GFRAA >60 11/24/2017   GFRNONAA >60 11/24/2017                              Hepatic Function Markers Lab Results  Component Value Date   AST 25 11/24/2017   ALT 17 11/24/2017   ALBUMIN 4.3 11/24/2017   ALKPHOS 34 (L) 11/24/2017                        Electrolytes Lab Results  Component Value Date   NA 140 11/24/2017   K 3.8 11/24/2017   CL 109 11/24/2017   CALCIUM 9.1 11/24/2017  Neuropathy Markers Lab Results  Component Value Date   HGBA1C 5.2 10/25/2017                        Bone Pathology Markers Lab Results  Component Value Date   TESTOFREE 20.5 10/25/2017   TESTOSTERONE 743 10/25/2017                         Coagulation Parameters Lab Results  Component Value Date   PLT 202 11/24/2017                        Cardiovascular Markers Lab Results  Component Value Date   HGB 14.8 11/24/2017   HCT 44.1 11/24/2017                         CA Markers No results found for: CEA, CA125, LABCA2                      Note: Lab results reviewed.  Recent Diagnostic Imaging Results  CT 3D INDEPENDENT [facility]KST CLINICAL DATA:  Chronic right hip and groin pain.  EXAM: CT OF THE RIGHT HIP WITHOUT CONTRAST;  3-DIMENSIONAL CT IMAGE RENDERING ON INDEPENDENT WORKSTATION  TECHNIQUE: 3-dimensional CT images were rendered by post-processing of the original CT data on an independent workstation. The 3-dimensional CT images were interpreted and findings were reported in the accompanying complete CT report for this study  Multidetector CT imaging of the right hip was performed according to the [employer] protocol. Multiplanar CT image  reconstructions were also generated. Axial images were also obtained through the knees.  COMPARISON:  MR arthrogram dated 01/21/2018  FINDINGS: Bones/Joint/Cartilage  Bones of the right hip appear normal. No joint space narrowing. Slight calcification in the labrum of the right hip.  Moderate right facet arthritis at L5-S1.  Muscles and Tendons  Normal.  Soft tissues  Normal.  3D images of the right hip were created including disarticulation views.  IMPRESSION: Essentially normal CT scan of the right hip. Minimal calcifications in the labrum.  Electronically Signed   By: Lorriane Shire M.D.   On: 02/21/2018 12:38 CT HIP RIGHT WO CONTRAST CLINICAL DATA:  Chronic right hip and groin pain.  EXAM: CT OF THE RIGHT HIP WITHOUT CONTRAST;  3-DIMENSIONAL CT IMAGE RENDERING ON INDEPENDENT WORKSTATION  TECHNIQUE: 3-dimensional CT images were rendered by post-processing of the original CT data on an independent workstation. The 3-dimensional CT images were interpreted and findings were reported in the accompanying complete CT report for this study  Multidetector CT imaging of the right hip was performed according to the [employer] protocol. Multiplanar CT image reconstructions were also generated. Axial images were also obtained through the knees.  COMPARISON:  MR arthrogram dated 01/21/2018  FINDINGS: Bones/Joint/Cartilage  Bones of the right hip appear normal. No joint space narrowing. Slight calcification in the labrum of the right hip.  Moderate right facet arthritis at L5-S1.  Muscles and Tendons  Normal.  Soft tissues  Normal.  3D images of the right hip were created including disarticulation views.  IMPRESSION: Essentially normal CT scan of the right hip. Minimal calcifications in the labrum.  Electronically Signed   By: Lorriane Shire M.D.   On: 02/21/2018 12:38  Complexity Note: Imaging results reviewed. Results shared with Johnny Villa, using  Layman's terms.  Meds   Current Outpatient Medications:  .  acetaminophen (TYLENOL) 500 MG tablet, Take 500 mg by mouth every 6 (six) hours as needed., Disp: , Rfl:  .  albuterol (PROVENTIL HFA) 108 (90 Base) MCG/ACT inhaler, Inhale into the lungs., Disp: , Rfl:  .  aspirin-acetaminophen-caffeine (EXCEDRIN MIGRAINE) 250-250-65 MG tablet, Take by mouth every 6 (six) hours as needed for headache., Disp: , Rfl:  .  buPROPion (WELLBUTRIN XL) 300 MG 24 hr tablet, Take 300 mg by mouth daily. , Disp: , Rfl:  .  busPIRone (BUSPAR) 30 MG tablet, 2 (two) times daily. , Disp: , Rfl:  .  clomiPHENE (CLOMID) 50 MG tablet, Take 1 tablet (50 mg total) by mouth every other day., Disp: 60 tablet, Rfl: 0 .  clonazePAM (KLONOPIN) 0.5 MG tablet, , Disp: , Rfl:  .  DULoxetine (CYMBALTA) 30 MG capsule, Take 30 mg by mouth daily., Disp: , Rfl:  .  fluticasone (FLONASE) 50 MCG/ACT nasal spray, PLACE 2 SPRAYS INTO EACH NOSTRIL QD, Disp: , Rfl:  .  lamoTRIgine (LAMICTAL) 200 MG tablet, Take by mouth daily. , Disp: , Rfl:  .  lithium carbonate 300 MG capsule, Take 300 mg by mouth daily. , Disp: , Rfl:  .  Melatonin 5 MG CAPS, , Disp: , Rfl:  .  Multiple Vitamins-Minerals (MULTIVITAMIN MEN) TABS, Take 1 tablet by mouth daily., Disp: , Rfl:  .  oxyCODONE (OXY IR/ROXICODONE) 5 MG immediate release tablet, Take 1 tablet (5 mg total) by mouth 2 (two) times daily as needed for severe pain. For chronic pain, Disp: 60 tablet, Rfl: 0 .  SALINE MIST 0.65 % nasal spray, Place 1 application into both nostrils as directed., Disp: , Rfl:  .  sildenafil (REVATIO) 20 MG tablet, Take 40 mg by mouth., Disp: , Rfl:  .  SUMAtriptan (IMITREX) 100 MG tablet, , Disp: , Rfl:  .  traZODone (DESYREL) 100 MG tablet, Take 200 mg by mouth at bedtime., Disp: , Rfl:  .  Vitamins A & D 5000-400 units CAPS, , Disp: , Rfl:   ROS  Constitutional: Denies any fever or chills Gastrointestinal: No reported hemesis, hematochezia,  vomiting, or acute GI distress Musculoskeletal: Denies any acute onset joint swelling, redness, loss of ROM, or weakness Neurological: No reported episodes of acute onset apraxia, aphasia, dysarthria, agnosia, amnesia, paralysis, loss of coordination, or loss of consciousness  Allergies  Johnny Villa has No Known Allergies.  PFSH  Drug: Johnny Villa  reports that he does not use drugs. Alcohol:  reports that he does not drink alcohol. Tobacco:  reports that he quit smoking about 3 years ago. His smoking use included cigarettes. He has never used smokeless tobacco. Medical:  has a past medical history of Bipolar disorder ([place]), Blood in stool, Depression, Drug abuse in remission, Fatty liver (01/2018), H/O alcohol abuse, Headache, Hypertension, and Low testosterone in male. Surgical: Johnny Villa  has a past surgical history that includes Appendectomy; Colonoscopy with propofol (N/A, 12/30/2017); and Esophagogastroduodenoscopy (egd) with propofol (N/A, 12/30/2017). Family: family history includes Alcohol abuse in his father; COPD in his father; Cancer in his father; Depression in his mother; Hyperlipidemia in his father; Hypertension in his father.  Constitutional Exam  General appearance: Well nourished, well developed, and well hydrated. In no apparent acute distress Vitals:   02/26/18 1401  BP: 124/76  Pulse: 62  Resp: 16  Temp: 98.2 F (36.8 C)  TempSrc: Oral  SpO2: 100%  Weight: 210 lb (95.3 kg)  Height: 5'  11" (1.803 m)   BMI Assessment: Estimated body mass index is 29.29 kg/m as calculated from the following:   Height as of this encounter: _0  (1.803 m).   Weight as of this encounter: 210 lb (95.3 kg).  BMI interpretation table: BMI level Category Range association with higher incidence of chronic pain  <18 kg/m2 Underweight   18.5-24.9 kg/m2 Ideal body weight   25-29.9 kg/m2 Overweight Increased incidence by 20%  30-34.9 kg/m2 Obese (Class I) Increased incidence by 68%   35-39.9 kg/m2 Severe obesity (Class II) Increased incidence by 136%  >40 kg/m2 Extreme obesity (Class III) Increased incidence by 254%   Patient's current BMI Ideal Body weight  Body mass index is 29.29 kg/m. Ideal body weight: 75.3 kg (166 lb 0.1 oz) Adjusted ideal body weight: 83.3 kg (183 lb 9.7 oz)   BMI Readings from Last 4 Encounters:  02/26/18 29.29 kg/m  02/05/18 29.29 kg/m  01/21/18 29.40 kg/m  01/08/18 27.89 kg/m   Wt Readings from Last 4 Encounters:  02/26/18 210 lb (95.3 kg)  02/05/18 210 lb (95.3 kg)  01/21/18 210 lb 12.8 oz (95.6 kg)  01/08/18 200 lb (90.7 kg)  Psych/Mental status: Alert, oriented x 3 (person, place, & time)       Eyes: PERLA Respiratory: No evidence of acute respiratory distress  Cervical Spine Area Exam  Skin & Axial Inspection: No masses, redness, edema, swelling, or associated skin lesions Alignment: Symmetrical Functional ROM: Decreased ROM      Stability: No instability detected Muscle Tone/Strength: Functionally intact. No obvious neuro-muscular anomalies detected. Sensory (Neurological): Articular pain pattern Palpation: Complains of area being tender to palpation              Upper Extremity (UE) Exam    Side: Right upper extremity  Side: Left upper extremity  Skin & Extremity Inspection: Skin color, temperature, and hair growth are WNL. No peripheral edema or cyanosis. No masses, redness, swelling, asymmetry, or associated skin lesions. No contractures.  Skin & Extremity Inspection: Skin color, temperature, and hair growth are WNL. No peripheral edema or cyanosis. No masses, redness, swelling, asymmetry, or associated skin lesions. No contractures.  Functional ROM: Unrestricted ROM          Functional ROM: Unrestricted ROM          Muscle Tone/Strength: Functionally intact. No obvious neuro-muscular anomalies detected.  Muscle Tone/Strength: Functionally intact. No obvious neuro-muscular anomalies detected.  Sensory (Neurological):  Unimpaired          Sensory (Neurological): Unimpaired          Palpation: No palpable anomalies              Palpation: No palpable anomalies              Provocative Test(s):  Phalen's test: deferred Tinel's test: deferred Apley's scratch test (touch opposite shoulder):  Action 1 (Across chest): deferred Action 2 (Overhead): deferred Action 3 (LB reach): deferred   Provocative Test(s):  Phalen's test: deferred Tinel's test: deferred Apley's scratch test (touch opposite shoulder):  Action 1 (Across chest): deferred Action 2 (Overhead): deferred Action 3 (LB reach): deferred    Thoracic Spine Area Exam  Skin & Axial Inspection: No masses, redness, or swelling Alignment: Symmetrical Functional ROM: Unrestricted ROM Stability: No instability detected Muscle Tone/Strength: Functionally intact. No obvious neuro-muscular anomalies detected. Sensory (Neurological): Unimpaired Muscle strength & Tone: No palpable anomalies  Lumbar Spine Area Exam  Skin & Axial Inspection: No masses, redness, or swelling Alignment:  Symmetrical Functional ROM: Unrestricted ROM       Stability: No instability detected Muscle Tone/Strength: Functionally intact. No obvious neuro-muscular anomalies detected. Sensory (Neurological): Unimpaired Palpation: No palpable anomalies       Provocative Tests: Lumbar Hyperextension/rotation test: deferred today       Lumbar quadrant test (Kemp's test): deferred today       Lumbar Lateral bending test: deferred today       Patrick's Maneuver: deferred today                   FABER test: deferred today       Thigh-thrust test: deferred today       S-I compression test: deferred today       S-I distraction test: deferred today        Gait & Posture Assessment  Ambulation: Unassisted Gait: Relatively normal for age and body habitus Posture: WNL   Lower Extremity Exam    Side: Right lower extremity  Side: Left lower extremity  Stability: No instability observed           Stability: No instability observed          Skin & Extremity Inspection: Skin color, temperature, and hair growth are WNL. No peripheral edema or cyanosis. No masses, redness, swelling, asymmetry, or associated skin lesions. No contractures.  Skin & Extremity Inspection: Skin color, temperature, and hair growth are WNL. No peripheral edema or cyanosis. No masses, redness, swelling, asymmetry, or associated skin lesions. No contractures.  Functional ROM: Unrestricted ROM                  Functional ROM: Unrestricted ROM                  Muscle Tone/Strength: Functionally intact. No obvious neuro-muscular anomalies detected.  Muscle Tone/Strength: Functionally intact. No obvious neuro-muscular anomalies detected.  Sensory (Neurological): Unimpaired  Sensory (Neurological): Unimpaired  Palpation: No palpable anomalies  Palpation: No palpable anomalies   Assessment  Primary Diagnosis & Pertinent Problem List: The primary encounter diagnosis was Cervical spondylosis without myelopathy (C4,5,6,7; L>R). Diagnoses of Cervical facet joint syndrome, DDD (degenerative disc disease), cervical, Cervicalgia, and Chronic pain syndrome were also pertinent to this visit.  Status Diagnosis  Persistent Responding Persistent 1. Cervical spondylosis without myelopathy (C4,5,6,7; L>R)   2. Cervical facet joint syndrome   3. DDD (degenerative disc disease), cervical   4. Cervicalgia   5. Chronic pain syndrome     General Recommendations: The pain condition that the patient suffers from is best treated with a multidisciplinary approach that involves an increase in physical activity to prevent de-conditioning and worsening of the pain cycle, as well as psychological counseling (formal and/or informal) to address the co-morbid psychological affects of pain. Treatment will often involve judicious use of pain medications and interventional procedures to decrease the pain, allowing the patient to participate in  the physical activity that will ultimately produce long-lasting pain reductions. The goal of the multidisciplinary approach is to return the patient to a higher level of overall function and to restore their ability to perform activities of daily living.  42 year old male with a history of cervical spondylosis, cervical facet joint syndrome, cervicalgia who presents for follow-up status post b patient endorses ilateral C4, C5, C6, C7 cervical facet medial branch nerve block performed on 02/05/2018.  60 to 70% pain relief in regards to his neck, shoulder pain for approximately 1 week after the procedure.  He states that after that, his  pain has returned and is very debilitating for him.  He is having difficulty performing activities of daily living and is requesting stronger pain medication.  We discussed a short duration of oxycodone to help with his acute on chronic pain.  [place] PMP checked and appropriate.  Will obtain UDS today.  Patient states that this should be positive for tramadol which he took from his fiance this morning given worsening neck pain.  We will also schedule the patient for repeat bilateral diagnostic cervical facet medial branch nerve blocks at C4, 5, 6, 7 #2.  This will be followed by radiofrequency ablation.  Patient instructed to continue physical therapy that he has been doing with [facility].  Plan: -Repeat cervical facet medial branch nerve blocks #2 bilaterally at C4, 5, 6, 7 without sedation -UDS today (should be positive for tramadol which the patient was honest about.  Patient took 1 tramadol this morning from his fiance given acute on chronic neck pain) I reminded the patient.  That he should not take any medications that are not prescribed to him and that if you were to do this in the future this would be considered violation of his chronic pain contract with our clinic.  Patient endorsed understanding. -Continue with physical therapy. -Prescription for oxycodone 5 mg twice  daily as needed, quantity 60 a month.   Plan of Care  Pharmacotherapy (Medications Ordered): Meds ordered this encounter  Medications  . oxyCODONE (OXY IR/ROXICODONE) 5 MG immediate release tablet    Sig: Take 1 tablet (5 mg total) by mouth 2 (two) times daily as needed for severe pain. For chronic pain    Dispense:  60 tablet    Refill:  0    Do not place this medication, or any other prescription from our practice, on "Automatic Refill". Patient may have prescription filled one day early if pharmacy is closed on scheduled refill date. Do not fill until:  To last until:   Lab-work, procedure(s), and/or referral(s): Orders Placed This Encounter  Procedures  . CERVICAL FACET (MEDIAL BRANCH NERVE BLOCK)   . Compliance Drug Analysis, Ur   Time Note: Greater than 50% of the 25 minute(s) of face-to-face time spent with Johnny Villa, was spent in counseling/coordination of care regarding: the appropriate use of the pain scale, Johnny Villa primary cause of pain, the results of his recent test(s), the treatment plan, treatment alternatives, the risks and possible complications of proposed treatment, medication side effects, going over the informed consent, the opioid analgesic risks and possible complications, the results, interpretation and significance of  his recent diagnostic interventional treatment(s), the appropriate use of his medications, realistic expectations, the goals of pain management (increased in functionality), the medication agreement and the patient's responsibilities when it comes to controlled substances.  Provider-requested follow-up: Return for Procedure.  No future appointments.  Primary Care Physician: McLean-Scocuzza, Nino Glow, MD Location: [facility] Note by: Gillis Santa, M.D Date: 02/26/2018; Time: 3:36 PM  There are no Patient Instructions on file for this visit.

## 2018-02-27 ENCOUNTER — Telehealth: Payer: Self-pay | Admitting: *Deleted

## 2018-02-27 MED ORDER — OXYCODONE HCL 5 MG PO TABS: 5.0000 mg | ORAL_TABLET | Freq: Two times a day (BID) | ORAL | 0 refills | Status: AC | PRN

## 2018-02-27 NOTE — Telephone Encounter (Signed)
Patient presents stating that he had problem getting Rx filled at [facility].  They would only give 7 days worth of the oxycodone 5 mg IR before getting a PA.  Also patient states that he only got about 1 hours relief from this IR tablet.    Spoke with DR Holley Raring, he did rewrite the remainder of the Rx but did not want to change to long lasting medication at this time. Rx delivered to patient for fill date of 03/05/18 qty 60 to last until 04/04/18

## 2018-02-27 NOTE — Addendum Note (Signed)
Addended by: Gillis Santa on: 02/27/2018 09:15 AM   Modules accepted: Orders

## 2018-03-04 LAB — COMPLIANCE DRUG ANALYSIS, UR

## 2018-03-17 ENCOUNTER — Ambulatory Visit (HOSPITAL_BASED_OUTPATIENT_CLINIC_OR_DEPARTMENT_OTHER): Payer: Medicare PPO | Admitting: Student in an Organized Health Care Education/Training Program

## 2018-03-17 ENCOUNTER — Ambulatory Visit
Admission: RE | Admit: 2018-03-17 | Discharge: 2018-03-17 | Disposition: A | Discharge status: Home / Self Care | Payer: Medicare PPO | Source: Ambulatory Visit | Attending: Student in an Organized Health Care Education/Training Program | Admitting: Student in an Organized Health Care Education/Training Program

## 2018-03-17 ENCOUNTER — Encounter: Payer: Self-pay | Admitting: Student in an Organized Health Care Education/Training Program

## 2018-03-17 DIAGNOSIS — M47812 Spondylosis without myelopathy or radiculopathy, cervical region: Secondary | ICD-10-CM | POA: Diagnosis present

## 2018-03-17 MED ORDER — DEXAMETHASONE SODIUM PHOSPHATE 10 MG/ML IJ SOLN
INTRAMUSCULAR | Status: AC
  Filled 2018-03-17: qty 1

## 2018-03-17 MED ORDER — LIDOCAINE HCL 1 % IJ SOLN
10.0000 mL | Freq: Once | INTRAMUSCULAR | Status: AC
  Administered 2018-03-17: 5 mL
  Filled 2018-03-17: qty 10

## 2018-03-17 MED ORDER — ROPIVACAINE HCL 2 MG/ML IJ SOLN
INTRAMUSCULAR | Status: AC
  Filled 2018-03-17: qty 10

## 2018-03-17 MED ORDER — OXYCODONE HCL ER 10 MG PO T12A: 10.0000 mg | EXTENDED_RELEASE_TABLET | Freq: Two times a day (BID) | ORAL | 0 refills | Status: DC

## 2018-03-17 MED ORDER — LIDOCAINE HCL (PF) 1 % IJ SOLN
INTRAMUSCULAR | Status: AC
  Filled 2018-03-17: qty 5

## 2018-03-17 MED ORDER — DEXAMETHASONE SODIUM PHOSPHATE 10 MG/ML IJ SOLN
INTRAMUSCULAR | Status: AC
Start: 2018-03-17 — End: ?
  Filled 2018-03-17: qty 1

## 2018-03-17 MED ORDER — DEXAMETHASONE SODIUM PHOSPHATE 10 MG/ML IJ SOLN
10.0000 mg | Freq: Once | INTRAMUSCULAR | Status: AC
  Administered 2018-03-17: 10 mg

## 2018-03-17 MED ORDER — LIDOCAINE-EPINEPHRINE (PF) 2 %-1:200000 IJ SOLN: 5.0000 mL | Freq: Once | INTRAMUSCULAR | Status: DC

## 2018-03-17 NOTE — Patient Instructions (Signed)
You were given one prescription for Oxycocone today.  Pain Management Discharge Instructions  General Discharge Instructions :  If you need to reach your doctor call: Monday-Friday 8:00 am - 4:00 pm at [phone] or toll free [phone].  After clinic hours [phone] to have operator reach doctor.  Bring all of your medication bottles to all your appointments in the pain clinic.  To cancel or reschedule your appointment with Pain Management please remember to call 24 hours in advance to avoid a fee.  Refer to the educational materials which you have been given on: General Risks, I had my Procedure. Discharge Instructions, Post Sedation.  Post Procedure Instructions:  Please notify your doctor immediately if you have any unusual bleeding, trouble breathing or pain that is not related to your normal pain.  Depending on the type of procedure that was done, some parts of your body may feel week and/or numb.  This usually clears up by tonight or the next day.  Walk with the use of an assistive device or accompanied by an adult for the 24 hours.  You may use ice on the affected area for the first 24 hours.  Put ice in a Ziploc bag and cover with a towel and place against area 15 minutes on 15 minutes off.  You may switch to heat after 24 hours.

## 2018-03-17 NOTE — Progress Notes (Signed)
Patient's Name: Johnny Villa  MRN: [id number]  Referring Provider: Gillis Santa, MD  DOB: Feb 25, 1976  PCP: McLean-Scocuzza, Nino Glow, MD  DOS: 03/17/2018  Note by: Gillis Santa, MD  Service setting: Ambulatory outpatient  Specialty: Interventional Pain Management  Patient type: Established  Location: [facility] (AMB) Pain Management Facility  Visit type: Interventional Procedure   Primary Reason for Visit: Interventional Pain Management Treatment. CC: Neck Pain (bilateral)  Procedure:       Anesthesia, Analgesia, Anxiolysis:  Type: Cervical Facet Medial Branch Block(s) #2  Primary Purpose: Diagnostic Region: Posterolateral cervical spine Level:  C4, C5, C6, & C7 Medial Branch Level(s). Injecting these levels blocks the  C4-5, C5-6, and C6-7 cervical facet joints. Laterality: Bilateral Paraspinal  Type: Local Anesthesia Indication(s): Analgesia and Anxiety Route: Infiltration ([place]/IM) IV Access: Declined Sedation: Declined  Local Anesthetic: Lidocaine 1%   Indications: 1. Cervical spondylosis without myelopathy (C4,5,6,7; L>R)    Pain Score: Pre-procedure: 7 /10 Post-procedure: 0-No pain/10  Pre-op Assessment:  Johnny Villa is a 41 y.o. (year old), male patient, seen today for interventional treatment. He  has a past surgical history that includes Appendectomy; Colonoscopy with propofol (N/A, 12/30/2017); and Esophagogastroduodenoscopy (egd) with propofol (N/A, 12/30/2017). Johnny Villa has a current medication list which includes the following prescription(s): acetaminophen, aspirin-acetaminophen-caffeine, buspirone, cholecalciferol, clomiphene, clonazepam, duloxetine, fluticasone, lamotrigine, lithium carbonate, melatonin, multivitamin men, oxycodone, saline mist, sildenafil, sumatriptan, trazodone, albuterol, bupropion, oxycodone, and vitamins a & d, and the following Facility-Administered Medications: lidocaine-epinephrine. His primarily concern today is the Neck Pain (bilateral)  Initial Vital  Signs:  Pulse/HCG Rate: 63ECG Heart Rate: (!) 55 Temp: 98.5 F (36.9 C) Resp: 16 BP: 120/80 SpO2: 99 %  BMI: Estimated body mass index is 28.87 kg/m as calculated from the following:   Height as of this encounter: 5\' 11"  (1.803 m).   Weight as of this encounter: 207 lb (93.9 kg).  Risk Assessment: Allergies: Reviewed. He has No Known Allergies.  Allergy Precautions: None required Coagulopathies: Reviewed. None identified.  Blood-thinner therapy: None at this time Active Infection(s): Reviewed. None identified. Johnny Villa is afebrile  Site Confirmation: Johnny Villa was asked to confirm the procedure and laterality before marking the site Procedure checklist: Completed Consent: Before the procedure and under the influence of no sedative(s), amnesic(s), or anxiolytics, the patient was informed of the treatment options, risks and possible complications. To fulfill our ethical and legal obligations, as recommended by the American Medical Association's Code of Ethics, I have informed the patient of my clinical impression; the nature and purpose of the treatment or procedure; the risks, benefits, and possible complications of the intervention; the alternatives, including doing nothing; the risk(s) and benefit(s) of the alternative treatment(s) or procedure(s); and the risk(s) and benefit(s) of doing nothing. The patient was provided information about the general risks and possible complications associated with the procedure. These may include, but are not limited to: failure to achieve desired goals, infection, bleeding, organ or nerve damage, allergic reactions, paralysis, and death. In addition, the patient was informed of those risks and complications associated to Spine-related procedures, such as failure to decrease pain; infection (i.e.: Meningitis, epidural or intraspinal abscess); bleeding (i.e.: epidural hematoma, subarachnoid hemorrhage, or any other type of intraspinal or peri-dural  bleeding); organ or nerve damage (i.e.: Any type of peripheral nerve, nerve root, or spinal cord injury) with subsequent damage to sensory, motor, and/or autonomic systems, resulting in permanent pain, numbness, and/or weakness of one or several areas of the body; allergic reactions; (i.e.: anaphylactic reaction);  and/or death. Furthermore, the patient was informed of those risks and complications associated with the medications. These include, but are not limited to: allergic reactions (i.e.: anaphylactic or anaphylactoid reaction(s)); adrenal axis suppression; blood sugar elevation that in diabetics may result in ketoacidosis or comma; water retention that in patients with history of congestive heart failure may result in shortness of breath, pulmonary edema, and decompensation with resultant heart failure; weight gain; swelling or edema; medication-induced neural toxicity; particulate matter embolism and blood vessel occlusion with resultant organ, and/or nervous system infarction; and/or aseptic necrosis of one or more joints. Finally, the patient was informed that Medicine is not an exact science; therefore, there is also the possibility of unforeseen or unpredictable risks and/or possible complications that may result in a catastrophic outcome. The patient indicated having understood very clearly. We have given the patient no guarantees and we have made no promises. Enough time was given to the patient to ask questions, all of which were answered to the patient's satisfaction. Johnny Villa has indicated that he wanted to continue with the procedure. Attestation: I, the ordering provider, attest that I have discussed with the patient the benefits, risks, side-effects, alternatives, likelihood of achieving goals, and potential problems during recovery for the procedure that I have provided informed consent. Date  Time:   Pre-Procedure Preparation:  Monitoring: As per clinic protocol. Respiration, ETCO2,  SpO2, BP, heart rate and rhythm monitor placed and checked for adequate function Safety Precautions: Patient was assessed for positional comfort and pressure points before starting the procedure. Time-out: I initiated and conducted the "Time-out" before starting the procedure, as per protocol. The patient was asked to participate by confirming the accuracy of the "Time Out" information. Verification of the correct person, site, and procedure were performed and confirmed by me, the nursing staff, and the patient. "Time-out" conducted as per Joint Commission's Universal Protocol (UP.01.01.01). Time: 1015  Description of Procedure:       Position: Prone with head of the table raised to facilitate breathing. Laterality: Bilateral. The procedure was performed in identical fashion on both sides. Level: C4, C5, C6, & C7 Medial Branch Level(s). Area Prepped: Posterior Cervico-thoracic Region Prepping solution: ChloraPrep (2% chlorhexidine gluconate and 70% isopropyl alcohol) Safety Precautions: Aspiration looking for blood return was conducted prior to all injections. At no point did we inject any substances, as a needle was being advanced. Before injecting, the patient was told to immediately notify me if he was experiencing any new onset of "ringing in the ears, or metallic taste in the mouth". No attempts were made at seeking any paresthesias. Safe injection practices and needle disposal techniques used. Medications properly checked for expiration dates. SDV (single dose vial) medications used. After the completion of the procedure, all disposable equipment used was discarded in the proper designated medical waste containers. Local Anesthesia: Protocol guidelines were followed. The patient was positioned over the fluoroscopy table. The area was prepped in the usual manner. The time-out was completed. The target area was identified using fluoroscopy. A 12-in long, straight, sterile hemostat was used with  fluoroscopic guidance to locate the targets for each level blocked. Once located, the skin was marked with an approved surgical skin marker. Once all sites were marked, the skin (epidermis, dermis, and hypodermis), as well as deeper tissues (fat, connective tissue and muscle) were infiltrated with a small amount of a short-acting local anesthetic, loaded on a 10cc syringe with a 25G, 1.5-in  Needle. An appropriate amount of time was allowed for local  anesthetics to take effect before proceeding to the next step. Local Anesthetic: Lidocaine 1.0% The unused portion of the local anesthetic was discarded in the proper designated containers. Technical explanation of process:   C4 Medial Branch Nerve Block (MBB): The target area for the C4 dorsal medial articular branch is the lateral concave waist of the articular pillar of C4. Under fluoroscopic guidance, a Quincke needle was inserted until contact was made with os over the postero-lateral aspect of the articular pillar of C4 (target area). After negative aspiration for blood,1 mL of the nerve block solution was injected without difficulty or complication. The needle was removed intact. C5 Medial Branch Nerve Block (MBB): The target area for the C5 dorsal medial articular branch is the lateral concave waist of the articular pillar of C5. Under fluoroscopic guidance, a Quincke needle was inserted until contact was made with os over the postero-lateral aspect of the articular pillar of C5 (target area). After negative aspiration for blood, 82mL of the nerve block solution was injected without difficulty or complication. The needle was removed intact. C6 Medial Branch Nerve Block (MBB): The target area for the C6 dorsal medial articular branch is the lateral concave waist of the articular pillar of C6. Under fluoroscopic guidance, a Quincke needle was inserted until contact was made with os over the postero-lateral aspect of the articular pillar of C6 (target area).  After negative aspiration for blood, 1 mL of the nerve block solution was injected without difficulty or complication. The needle was removed intact. C7 Medial Branch Nerve Block (MBB): The target for the C7 dorsal medial articular branch lies on the superior-medial tip of the C7 transverse process. Under fluoroscopic guidance, a Quincke needle was inserted until contact was made with os over the postero-lateral aspect of the articular pillar of C7 (target area). After negative aspiration for blood, 92mL of the nerve block solution was injected without difficulty or complication. The needle was removed intact. Procedural Needles: 25-gauge, 3.5-inch, Quincke needles used for all levels. Nerve block solution: 10 cc solution made of 9 cc of 0.2% ropivacaine, 1 cc of Decadron 10 mg/cc.  Approximately 1.2 cc injected at each level.  The unused portion of the solution was discarded in the proper designated containers.  Once the entire procedure was completed, the treated area was cleaned, making sure to leave some of the prepping solution back to take advantage of its long term bactericidal properties.  Vitals:   03/17/18 1023 03/17/18 1028 03/17/18 1033 03/17/18 1037  BP: 133/84 133/87 134/87 115/76  Pulse:      Resp: 13 18 20 18   Temp:      TempSrc:      SpO2: 98% 96% 98% 100%  Weight:      Height:        Start Time: 1015 hrs. End Time: 1035 hrs.  Imaging Guidance (Spinal):  Type of Imaging Technique: Fluoroscopy Guidance (Spinal) Indication(s): Assistance in needle guidance and placement for procedures requiring needle placement in or near specific anatomical locations not easily accessible without such assistance. Exposure Time: Please see nurses notes. Contrast: None used. Fluoroscopic Guidance: I was personally present during the use of fluoroscopy. "Tunnel Vision Technique" used to obtain the best possible view of the target area. Parallax error corrected before commencing the procedure.  "Direction-depth-direction" technique used to introduce the needle under continuous pulsed fluoroscopy. Once target was reached, antero-posterior, oblique, and lateral fluoroscopic projection used confirm needle placement in all planes. Images permanently stored in EMR. Interpretation: No  contrast injected. I personally interpreted the imaging intraoperatively. Adequate needle placement confirmed in multiple planes. Permanent images saved into the patient's record.  Antibiotic Prophylaxis:   Anti-infectives (From admission, onward)   None     Indication(s): None identified  Post-operative Assessment:  Post-procedure Vital Signs:  Pulse/HCG Rate: 63(!) 58 Temp: 98.5 F (36.9 C) Resp: 18 BP: 115/76 SpO2: 100 %  EBL: None  Complications: No immediate post-treatment complications observed by team, or reported by patient.  Note: The patient tolerated the entire procedure well. A repeat set of vitals were taken after the procedure and the patient was kept under observation following institutional policy, for this type of procedure. Post-procedural neurological assessment was performed, showing return to baseline, prior to discharge. The patient was provided with post-procedure discharge instructions, including a section on how to identify potential problems. Should any problems arise concerning this procedure, the patient was given instructions to immediately contact us, at any time, without hesitation. In any case, we plan to contact the patient by telephone for a follow-up status report regarding this interventional procedure.  Comments:  No additional relevant information. 5 out of 5 strength bilateral upper extremity: Shoulder abduction, elbow flexion, elbow extension, thumb extension.  Plan of Care    Imaging Orders     DG C-Arm 1-60 Min-No Report Procedure Orders    No procedure(s) ordered today   Patient states that previous oxycodone is only lasting him for 2 to 3 hours.  He is  requesting a long-acting medication instead.  I have instructed the patient to only take his oxycodone that he has left for breakthrough pain.  I will prescribe him OxyContin 10 mg twice daily as needed, quantity 60/month. [place] PMP checked and appropriate.  Medications ordered for procedure: Meds ordered this encounter  Medications  . lidocaine (XYLOCAINE) 1 % (with pres) injection 10 mL  . lidocaine-EPINEPHrine (XYLOCAINE W/EPI) 2 %-1:200000 (PF) injection 5 mL  . dexamethasone (DECADRON) injection 10 mg  . dexamethasone (DECADRON) injection 10 mg  . oxyCODONE (OXYCONTIN) 10 mg 12 hr tablet    Sig: Take 1 tablet (10 mg total) by mouth every 12 (twelve) hours.    Dispense:  60 tablet    Refill:  0    Do not place this medication, or any other prescription from our practice, on "Automatic Refill". Patient may have prescription filled one day early if pharmacy is closed on scheduled refill date.   To last for 1 month from fill date.   Medications administered: We administered lidocaine, dexamethasone, and dexamethasone.  See the medical record for exact dosing, route, and time of administration.  New Prescriptions   OXYCODONE (OXYCONTIN) 10 MG 12 HR TABLET    Take 1 tablet (10 mg total) by mouth every 12 (twelve) hours.   Disposition: Discharge home  Discharge Date & Time: 03/17/2018; 1040 hrs.   Physician-requested Follow-up: Return in about 1 month (around 04/14/2018) for Post Procedure Evaluation, Medication Management.  Future Appointments  Date Time Provider [place]  04/09/2018 12:30 PM Gillis Santa, MD [facility] None   Primary Care Physician: McLean-Scocuzza, Nino Glow, MD Location: [facility] Note by: Gillis Santa, MD Date: 03/17/2018; Time: 3:22 PM  Disclaimer:  Medicine is not an exact science. The only guarantee in medicine is that nothing is guaranteed. It is important to note that the decision to proceed with this intervention was  based on the information collected from the patient. The Data and conclusions were drawn from the patient's  questionnaire, the interview, and the physical examination. Because the information was provided in large part by the patient, it cannot be guaranteed that it has not been purposely or unconsciously manipulated. Every effort has been made to obtain as much relevant data as possible for this evaluation. It is important to note that the conclusions that lead to this procedure are derived in large part from the available data. Always take into account that the treatment will also be dependent on availability of resources and existing treatment guidelines, considered by other Pain Management Practitioners as being common knowledge and practice, at the time of the intervention. For Medico-Legal purposes, it is also important to point out that variation in procedural techniques and pharmacological choices are the acceptable norm. The indications, contraindications, technique, and results of the above procedure should only be interpreted and judged by a Board-Certified Interventional Pain Specialist with extensive familiarity and expertise in the same exact procedure and technique.

## 2018-03-17 NOTE — Progress Notes (Signed)
Safety precautions to be maintained throughout the outpatient stay will include: orient to surroundings, keep bed in low position, maintain call bell within reach at all times, provide assistance with transfer out of bed and ambulation.  

## 2018-03-18 ENCOUNTER — Telehealth: Payer: Self-pay

## 2018-03-18 NOTE — Telephone Encounter (Signed)
Attempted to call patient. Left message on AM to call if needed.

## 2018-03-19 ENCOUNTER — Telehealth: Payer: Self-pay | Admitting: *Deleted

## 2018-03-19 MED ORDER — OXYCODONE ER 9 MG PO C12A: 9.0000 mg | EXTENDED_RELEASE_CAPSULE | Freq: Two times a day (BID) | ORAL | 0 refills | Status: DC | PRN

## 2018-03-19 NOTE — Addendum Note (Signed)
Addended by: Gillis Santa on: 03/19/2018 03:43 PM   Modules accepted: Orders

## 2018-03-19 NOTE — Telephone Encounter (Signed)
Spoke with patient and he needs to have a PA for [facility] for oxycontin 10 mg 12 hour tablet. Checked on covermymeds and found that the medication was denied d/t not being on the formulary.    Spoke with Dr Holley Raring and he is obliged to prescribe [facility] ER which is on the formulary with [place].   Spoke back with the patient and he is concerned about the pain relief coverage that this medication would offer him.  I did explain that Extampsa ER is comparable to oxycontin 10 mg ER but he is still concerned.  I did tell him that I have already sent an appeal for the oxycontin 10 mg ER and we could possibly wait and see the response from that.  Spoke with DR Holley Raring and he states that the medications are the same and since the [facility] ER is covered in the formulary he should take that.    Will call patient tomorrow to let him know that the Rx has been printed for the [facility] ER 9 mg bid.

## 2018-03-19 NOTE — Progress Notes (Addendum)
We will just notify the patient's OxyContin 10 mg twice daily will not be covered by insurance company.  Insurance will cover [facility] ER which is extended release oxycodone.  We will provide him with equivalent dosing of 9 mg twice daily of extends up.  This is equivalent to OxyContin 10 mill grams twice daily.  Will call pharmacy to void OxyContin prescription of 10 mg twice daily.  Requested Prescriptions   Signed Prescriptions Disp Refills  . oxyCODONE ER (XTAMPZA ER) 9 MG C12A 60 each 0    Sig: Take 9 mg by mouth 2 (two) times daily as needed. For chronic pain To last for 30 days from fill date

## 2018-03-20 NOTE — Telephone Encounter (Signed)
Spoke with Johnny Villa this morning and let him know that the oxycontin 10 mg ER still has not been approved.  After talking with Dr Holley Raring on yesterday his recommendation was to go with [name] ER 9 mg since that is on their formulary and does not require approval.  Rx is written for him to pick up at his convenience.  Patient verbalizes u/o information.  [facility] on [address] called and instructed to destroy Rx for oxycontin 10 mg ER.  Johnny Villa states that she will have pharmacist deactivate the Rx.

## 2018-04-09 ENCOUNTER — Ambulatory Visit
Payer: Medicare PPO | Attending: Student in an Organized Health Care Education/Training Program | Admitting: Student in an Organized Health Care Education/Training Program

## 2018-04-09 ENCOUNTER — Telehealth: Payer: Self-pay | Admitting: *Deleted

## 2018-04-09 ENCOUNTER — Encounter: Payer: Self-pay | Admitting: Student in an Organized Health Care Education/Training Program

## 2018-04-09 VITALS — BP 125/89 | HR 68 | Temp 98.7°F | Resp 16 | Ht 71.0 in | Wt 207.0 lb

## 2018-04-09 DIAGNOSIS — F319 Bipolar disorder, unspecified: Secondary | ICD-10-CM | POA: Insufficient documentation

## 2018-04-09 DIAGNOSIS — E291 Testicular hypofunction: Secondary | ICD-10-CM | POA: Diagnosis not present

## 2018-04-09 DIAGNOSIS — M25511 Pain in right shoulder: Secondary | ICD-10-CM | POA: Insufficient documentation

## 2018-04-09 DIAGNOSIS — M47812 Spondylosis without myelopathy or radiculopathy, cervical region: Secondary | ICD-10-CM | POA: Insufficient documentation

## 2018-04-09 DIAGNOSIS — R1013 Epigastric pain: Secondary | ICD-10-CM | POA: Insufficient documentation

## 2018-04-09 DIAGNOSIS — G894 Chronic pain syndrome: Secondary | ICD-10-CM | POA: Diagnosis not present

## 2018-04-09 DIAGNOSIS — K760 Fatty (change of) liver, not elsewhere classified: Secondary | ICD-10-CM | POA: Diagnosis not present

## 2018-04-09 DIAGNOSIS — M545 Low back pain: Secondary | ICD-10-CM | POA: Insufficient documentation

## 2018-04-09 DIAGNOSIS — M25561 Pain in right knee: Secondary | ICD-10-CM | POA: Diagnosis not present

## 2018-04-09 DIAGNOSIS — M25551 Pain in right hip: Secondary | ICD-10-CM | POA: Diagnosis not present

## 2018-04-09 MED ORDER — OXYCODONE HCL 5 MG PO TABS
7.5000 mg | ORAL_TABLET | Freq: Two times a day (BID) | ORAL | 0 refills | Status: DC | PRN
Start: 2018-04-09 — End: 2018-05-19

## 2018-04-09 NOTE — Telephone Encounter (Signed)
Explained to pharmacist that new dose is because patient is discontinuing long acting opiod.

## 2018-04-09 NOTE — Progress Notes (Signed)
Safety precautions to be maintained throughout the outpatient stay will include: orient to surroundings, keep bed in low position, maintain call bell within reach at all times, provide assistance with transfer out of bed and ambulation.   Patient states today that he is wondering if CBD oil will cause his urine to show positive for THC.  He states he is taking approx 60 mg daily. But stopped taking this a couple of weeks ago.

## 2018-04-09 NOTE — Patient Instructions (Addendum)
____________________________________________________________________________________________  General Risks and Possible Complications  Patient Responsibilities: It is important that you read this as it is part of your informed consent. It is our duty to inform you of the risks and possible complications associated with treatments offered to you. It is your responsibility as a patient to read this and to ask questions about anything that is not clear or that you believe was not covered in this document.  Patient's Rights: You have the right to refuse treatment. You also have the right to change your mind, even after initially having agreed to have the treatment done. However, under this last option, if you wait until the last second to change your mind, you may be charged for the materials used up to that point.  Introduction: Medicine is not an exact science. Everything in Medicine, including the lack of treatment(s), carries the potential for danger, harm, or loss (which is by definition: Risk). In Medicine, a complication is a secondary problem, condition, or disease that can aggravate an already existing one. All treatments carry the risk of possible complications. The fact that a side effects or complications occurs, does not imply that the treatment was conducted incorrectly. It must be clearly understood that these can happen even when everything is done following the highest safety standards.  No treatment: You can choose not to proceed with the proposed treatment alternative. The "PRO(s)" would include: avoiding the risk of complications associated with the therapy. The "CON(s)" would include: not getting any of the treatment benefits. These benefits fall under one of three categories: diagnostic; therapeutic; and/or palliative. Diagnostic benefits include: getting information which can ultimately lead to improvement of the disease or symptom(s). Therapeutic benefits are those associated with the  successful treatment of the disease. Finally, palliative benefits are those related to the decrease of the primary symptoms, without necessarily curing the condition (example: decreasing the pain from a flare-up of a chronic condition, such as incurable terminal cancer).  General Risks and Complications: These are associated to most interventional treatments. They can occur alone, or in combination. They fall under one of the following six (6) categories: no benefit or worsening of symptoms; bleeding; infection; nerve damage; allergic reactions; and/or death. 1. No benefits or worsening of symptoms: In Medicine there are no guarantees, only probabilities. No healthcare provider can ever guarantee that a medical treatment will work, they can only state the probability that it may. Furthermore, there is always the possibility that the condition may worsen, either directly, or indirectly, as a consequence of the treatment. 2. Bleeding: This is more common if the patient is taking a blood thinner, either prescription or over the counter (example: Goody Powders, Fish oil, Aspirin, Garlic, etc.), or if suffering a condition associated with impaired coagulation (example: Hemophilia, cirrhosis of the liver, low platelet counts, etc.). However, even if you do not have one on these, it can still happen. If you have any of these conditions, or take one of these drugs, make sure to notify your treating physician. 3. Infection: This is more common in patients with a compromised immune system, either due to disease (example: diabetes, cancer, human immunodeficiency virus [HIV], etc.), or due to medications or treatments (example: therapies used to treat cancer and rheumatological diseases). However, even if you do not have one on these, it can still happen. If you have any of these conditions, or take one of these drugs, make sure to notify your treating physician. 4. Nerve Damage: This is more common when the   treatment is  an invasive one, but it can also happen with the use of medications, such as those used in the treatment of cancer. The damage can occur to small secondary nerves, or to large primary ones, such as those in the spinal cord and brain. This damage may be temporary or permanent and it may lead to impairments that can range from temporary numbness to permanent paralysis and/or brain death. 5. Allergic Reactions: Any time a substance or material comes in contact with our body, there is the possibility of an allergic reaction. These can range from a mild skin rash (contact dermatitis) to a severe systemic reaction (anaphylactic reaction), which can result in death. 6. Death: In general, any medical intervention can result in death, most of the time due to an unforeseen complication. ____________________________________________________________________________________________  Radiofrequency Lesioning Radiofrequency lesioning is a procedure that is performed to relieve pain. The procedure is often used for back, neck, or arm pain. Radiofrequency lesioning involves the use of a machine that creates radio waves to make heat. During the procedure, the heat is applied to the nerve that carries the pain signal. The heat damages the nerve and interferes with the pain signal. Pain relief usually starts about 2 weeks after the procedure and lasts for 6 months to 1 year. Tell a health care provider about:  Any allergies you have.  All medicines you are taking, including vitamins, herbs, eye drops, creams, and over-the-counter medicines.  Any problems you or family members have had with anesthetic medicines.  Any blood disorders you have.  Any surgeries you have had.  Any medical conditions you have.  Whether you are pregnant or may be pregnant. What are the risks? Generally, this is a safe procedure. However, problems may occur, including:  Pain or soreness at the injection site.  Infection at the  injection site.  Damage to nerves or blood vessels.  What happens before the procedure?  Ask your health care provider about: ? Changing or stopping your regular medicines. This is especially important if you are taking diabetes medicines or blood thinners. ? Taking medicines such as aspirin and ibuprofen. These medicines can thin your blood. Do not take these medicines before your procedure if your health care provider instructs you not to.  Follow instructions from your health care provider about eating or drinking restrictions.  Plan to have someone take you home after the procedure.  If you go home right after the procedure, plan to have someone with you for 24 hours. What happens during the procedure?  You will be given one or more of the following: ? A medicine to help you relax (sedative). ? A medicine to numb the area (local anesthetic).  You will be awake during the procedure. You will need to be able to talk with the health care provider during the procedure.  With the help of a type of X-ray (fluoroscopy), the health care provider will insert a radiofrequency needle into the area to be treated.  Next, a wire that carries the radio waves (electrode) will be put through the radiofrequency needle. An electrical pulse will be sent through the electrode to verify the correct nerve. You will feel a tingling sensation, and you may have muscle twitching.  Then, the tissue that is around the needle tip will be heated by an electric current that is passed using the radiofrequency machine. This will numb the nerves.  A bandage (dressing) will be put on the insertion area after the procedure is done.  The procedure may vary among health care providers and hospitals. What happens after the procedure?  Your blood pressure, heart rate, breathing rate, and blood oxygen level will be monitored often until the medicines you were given have worn off.  Return to your normal activities as  directed by your health care provider. This information is not intended to replace advice given to you by your health care provider. Make sure you discuss any questions you have with your health care provider. Document Released: 05/16/2011 Document Revised: 02/23/2016 Document Reviewed: 10/25/2014 [employer] Interactive Patient Education  2018 [employer].   oyxcodone 5 mg 1.5 mg tablets x 2 times daily prn pain

## 2018-04-09 NOTE — Telephone Encounter (Signed)
Pharmacy asking why dose was increased.Will ask Dr. Holley Raring.

## 2018-04-09 NOTE — Progress Notes (Signed)
Patient's Name: Johnny Villa  MRN: [id number]  Referring Provider: Orland Mustard *  DOB: 03/17/1976  PCP: McLean-Scocuzza, Nino Glow, MD  DOS: 04/09/2018  Note by: Gillis Santa, MD  Service setting: Ambulatory outpatient  Specialty: Interventional Pain Management  Location: [facility] (AMB) Pain Management Facility    Patient type: Established   Primary Reason(s) for Visit: Encounter for prescription drug management & post-procedure evaluation of chronic illness with mild to moderate exacerbation(Level of risk: moderate) CC: Back Pain (lower bilateral )  HPI  Johnny Villa is a 42 y.o. year old, male patient, who comes today for a post-procedure evaluation and medication management. He has Back pain; Right hip pain; Knee pain, right; Hypogonadism in male; Low testosterone; Erectile dysfunction; Bipolar 1 disorder ([place]); Epigastric pain; Anal fissure; Blood in stool; Hemorrhoids; Cervicalgia; Chronic right shoulder pain; Cervical spondylosis without myelopathy (C4,5,6,7; L>R); Cervical facet joint syndrome; and DDD (degenerative disc disease), cervical on their problem list. His primarily concern today is the Back Pain (lower bilateral )  Pain Assessment: Location:    Neck and shoulder region, right greater than left Radiating:  Radiates to shoulder region Onset:  Greater than 3 years ago Duration:  Worse in the evenings and after exertion Quality:  Achy, throbbing, painful, sharp, shooting, stabbing Severity: 7 /10 (subjective, self-reported pain score)  Note: Reported level is inconsistent with clinical observations.                         When using our objective Pain Scale, levels between 6 and 10/10 are said to belong in an emergency room, as it progressively worsens from a 6/10, described as severely limiting, requiring emergency care not usually available at an outpatient pain management facility. At a 6/10 level, communication becomes difficult and requires great effort. Assistance to  reach the emergency department may be required. Facial flushing and profuse sweating along with potentially dangerous increases in heart rate and blood pressure will be evident. Effect on ADL:  Limits ADLs Timing:  Worse in the evening Modifying factors:   BP: 125/89  HR: 68  Johnny Villa was last seen on 03/19/2018 for a procedure. During today's appointment we reviewed Johnny Villa post-procedure results, as well as his outpatient medication regimen.  Patient presents for postprocedural assessment status post bilateral cervical facet medial branch nerve blocks at C4, 5, 6, 7 which provided the patient with approximately 70% pain relief for the first 7 to 10 days after his block.  Patient endorsed improvement in range of motion and functional status along with his pain scores.  In regards to medication management, the patient does not find any significant benefit with the Xtampza ER at 9 mg twice daily.  He does get about 3 to 4 hours of pain relief with 5 mill grams of oxycodone when use for breakthrough pain.  Further details on both, my assessment(s), as well as the proposed treatment plan, please see below.  Controlled Substance Pharmacotherapy Assessment REMS (Risk Evaluation and Mitigation Strategy)  Analgesic: Xtampza 9 mg twice daily, oxycodone 5 mg twice daily as needed breakthrough pain MME/day: 45 mg/day.  Janett Billow, RN  04/09/2018  1:33 PM  Sign at close encounter Safety precautions to be maintained throughout the outpatient stay will include: orient to surroundings, keep bed in low position, maintain call bell within reach at all times, provide assistance with transfer out of bed and ambulation.   Patient states today that he is wondering if CBD oil will  cause his urine to show positive for THC.  He states he is taking approx 60 mg daily. But stopped taking this a couple of weeks ago.    Pharmacokinetics: Liberation and absorption (onset of action): WNL Distribution  (time to peak effect): WNL Metabolism and excretion (duration of action): WNL         Pharmacodynamics: Desired effects: Analgesia: Johnny Villa reports >50% benefit. Functional ability: Patient reports that medication allows him to accomplish basic ADLs Clinically meaningful improvement in function (CMIF): Sustained CMIF goals met Perceived effectiveness: Described as ineffective and would like to make some changes Undesirable effects: Side-effects or Adverse reactions: None reported Monitoring: [place] PMP: Online review of the past 40-monthperiod conducted. Compliant with practice rules and regulations Last UDS on record: Summary  Date Value Ref Range Status  02/26/2018 FINAL  Final    Comment:    ==================================================================== [facility] COMP DRUG ANALYSIS,UR ==================================================================== Test                             Result       Flag       Units Drug Present and Declared for Prescription Verification   7-aminoclonazepam              26           EXPECTED   ng/mg creat    7-aminoclonazepam is an expected metabolite of clonazepam. Source    of clonazepam is a scheduled prescription medication.   Lamotrigine                    PRESENT      EXPECTED   Trazodone                      PRESENT      EXPECTED   1,3 chlorophenyl piperazine    PRESENT      EXPECTED    1,3-chlorophenyl piperazine is an expected metabolite of    trazodone. Drug Present not Declared for Prescription Verification   Carboxy-THC                    14           UNEXPECTED ng/mg creat    Carboxy-THC is a metabolite of tetrahydrocannabinol  (THC).    [facility], but THC is also present    in a scheduled prescription medication.   Tramadol                       1917         UNEXPECTED ng/mg creat   O-Desmethyltramadol            2171         UNEXPECTED ng/mg creat   N-Desmethyltramadol            2006          UNEXPECTED ng/mg creat    Source of tramadol is a prescription medication.    O-desmethyltramadol and N-desmethyltramadol are expected    metabolites of tramadol. Drug Absent but Declared for Prescription Verification   Oxycodone                      Not Detected UNEXPECTED ng/mg creat   Bupropion  Not Detected UNEXPECTED   Duloxetine                     Not Detected UNEXPECTED   Acetaminophen                  Not Detected UNEXPECTED    Acetaminophen, as indicated in the declared medication list, is    not always detected even when used as directed.   Salicylate                     Not Detected UNEXPECTED    Aspirin, as indicated in the declared medication list, is not    always detected even when used as directed. ==================================================================== Test                      Result    Flag   Units      Ref Range   Creatinine              109              mg/dL      >=20 ==================================================================== Declared Medications:  The flagging and interpretation on this report are based on the  following declared medications.  Unexpected results may arise from  inaccuracies in the declared medications.  **Note: The testing scope of this panel includes these medications:  Bupropion (Wellbutrin)  Clonazepam (Klonopin)  Duloxetine (Cymbalta)  Lamotrigine (Lamictal)  Oxycodone (Roxicodone)  Trazodone (Desyrel)  **Note: The testing scope of this panel does not include small to  moderate amounts of these reported medications:  Acetaminophen (Excedrin)  Acetaminophen (Tylenol)  Aspirin (Excedrin)  **Note: The testing scope of this panel does not include following  reported medications:  Albuterol  Buspirone (BuSpar)  Clomiphene (Clomid)  Fluticasone (Flonase)  Lithium  Melatonin  Multivitamin  Saline  Sildenafil  Sumatriptan (Imitrex)  Vitamin A  Vitamin  D ==================================================================== For clinical consultation, please call [phone]. ====================================================================    UDS interpretation: Unexpected findings: Undeclared illicit substance detected Medication Assessment Form: Reviewed. Patient indicates being compliant with therapy Treatment compliance: Compliant Risk Assessment Profile: Aberrant behavior: See prior evaluations. None observed or detected today Comorbid factors increasing risk of overdose: See prior notes. No additional risks detected today Risk of substance use disorder (SUD): Moderate Opioid Risk Tool - 04/09/18 1253      Family History of Substance Abuse   Alcohol  Negative    Illegal Drugs  Negative    Rx Drugs  Negative      Personal History of Substance Abuse   Alcohol  Negative    Illegal Drugs  Negative    Rx Drugs  Negative      Psychological Disease   Psychological Disease  Positive    ADD  Negative    OCD  Negative    Bipolar  Negative    Schizophrenia  Negative    Depression  Positive patient has just been switched from wellbutrin to cymbalta by pyschiatrist and reports that is working.    patient has just been switched from wellbutrin to cymbalta by pyschiatrist and reports that is working.      Total Score   Opioid Risk Tool Scoring  3    Opioid Risk Interpretation  Low Risk      ORT Scoring interpretation table:  Score <3 = Low Risk for SUD  Score between 4-7 = Moderate Risk for SUD  Score >8 = High Risk for  Opioid Abuse   Risk Mitigation Strategies:  Patient Counseling: Covered Patient-Prescriber Agreement (PPA): Present and active  Notification to other healthcare providers: Done  Pharmacologic Plan: Discontinue long-acting opioid.  Will increase short acting oxycodone to 7.5 mg twice daily as needed for breakthrough pain.  Notify the patient that chronic opioid therapy would not be a long-term option for  him but something that we are using short-term until we can complete the ablation and get his neck pain controlled.  I also notify the patient about his urine drug screen which was positive for THC.  The patient denies utilizing any THC.  We will repeat today.             Post-Procedure Assessment  03/17/2018 Procedure: Bilateral C4, 5, 6, 7 facet medial branch nerve block Pre-procedure pain score:  7/10 Post-procedure pain score: 0/10         Influential Factors: BMI: 28.87 kg/m Intra-procedural challenges: None observed.         Assessment challenges: None detected.              Reported side-effects: None.        Post-procedural adverse reactions or complications: None reported         Sedation: Please see nurses note. When no sedatives are used, the analgesic levels obtained are directly associated to the effectiveness of the local anesthetics. However, when sedation is provided, the level of analgesia obtained during the initial 1 hour following the intervention, is believed to be the result of a combination of factors. These factors may include, but are not limited to: 1. The effectiveness of the local anesthetics used. 2. The effects of the analgesic(s) and/or anxiolytic(s) used. 3. The degree of discomfort experienced by the patient at the time of the procedure. 4. The patients ability and reliability in recalling and recording the events. 5. The presence and influence of possible secondary gains and/or psychosocial factors. Reported result: Relief experienced during the 1st hour after the procedure: 100 % (Ultra-Short Term Relief)            Interpretative annotation: Clinically appropriate result. Analgesia during this period is likely to be Local Anesthetic and/or IV Sedative (Analgesic/Anxiolytic) related.          Effects of local anesthetic: The analgesic effects attained during this period are directly associated to the localized infiltration of local anesthetics and therefore  cary significant diagnostic value as to the etiological location, or anatomical origin, of the pain. Expected duration of relief is directly dependent on the pharmacodynamics of the local anesthetic used. Long-acting (4-6 hours) anesthetics used.  Reported result: Relief during the next 4 to 6 hour after the procedure: 100 % (Short-Term Relief)            Interpretative annotation: Clinically appropriate result. Analgesia during this period is likely to be Local Anesthetic-related.          Long-term benefit: Defined as the period of time past the expected duration of local anesthetics (1 hour for short-acting and 4-6 hours for long-acting). With the possible exception of prolonged sympathetic blockade from the local anesthetics, benefits during this period are typically attributed to, or associated with, other factors such as analgesic sensory neuropraxia, antiinflammatory effects, or beneficial biochemical changes provided by agents other than the local anesthetics.  Reported result: Extended relief following procedure: 80 %(patient states that relief lasted approx 7- 10 days and then like a light switch the pain came back ) (Long-Term Relief)  Interpretative annotation: Clinically appropriate result. Good relief. No permanent benefit expected. Inflammation plays a part in the etiology to the pain.          Current benefits: Defined as reported results that persistent at this point in time.   Analgesia: <50 %            Function: Somewhat improved ROM: Somewhat improved Interpretative annotation: Recurrence of symptoms. No permanent benefit expected. Effective diagnostic intervention.          Interpretation: Results would suggest a successful diagnostic intervention.                  Plan:  Proceed with Radiofrequency Ablation for the purpose of attaining long-term benefits.                Laboratory Chemistry  Inflammation Markers (CRP: Acute Phase) (ESR: Chronic Phase) Lab Results   Component Value Date   CRP 1.0 10/25/2017   ESRSEDRATE 5 10/25/2017                         Rheumatology Markers Lab Results  Component Value Date   RF <10.0 10/25/2017   LABURIC 4.4 10/25/2017                        Renal Function Markers Lab Results  Component Value Date   BUN 15 11/24/2017   CREATININE 1.04 11/24/2017   BCR 15 10/25/2017   GFRAA >60 11/24/2017   GFRNONAA >60 11/24/2017                             Hepatic Function Markers Lab Results  Component Value Date   AST 25 11/24/2017   ALT 17 11/24/2017   ALBUMIN 4.3 11/24/2017   ALKPHOS 34 (L) 11/24/2017                        Electrolytes Lab Results  Component Value Date   NA 140 11/24/2017   K 3.8 11/24/2017   CL 109 11/24/2017   CALCIUM 9.1 11/24/2017                        Neuropathy Markers Lab Results  Component Value Date   HGBA1C 5.2 10/25/2017                        Bone Pathology Markers Lab Results  Component Value Date   TESTOFREE 20.5 10/25/2017   TESTOSTERONE 743 10/25/2017                         Coagulation Parameters Lab Results  Component Value Date   PLT 202 11/24/2017                        Cardiovascular Markers Lab Results  Component Value Date   HGB 14.8 11/24/2017   HCT 44.1 11/24/2017                         CA Markers No results found for: CEA, CA125, LABCA2                      Note: Lab results reviewed.  Recent Diagnostic Imaging Results  DG C-Arm 1-60 Min-No Report Fluoroscopy  was utilized by the requesting physician.  No radiographic  interpretation.   Complexity Note: Imaging results reviewed. Results shared with Johnny Villa, using Layman's terms.                         Meds   Current Outpatient Medications:  .  acetaminophen (TYLENOL) 500 MG tablet, Take 500 mg by mouth every 6 (six) hours as needed., Disp: , Rfl:  .  albuterol (PROVENTIL HFA) 108 (90 Base) MCG/ACT inhaler, Inhale into the lungs., Disp: , Rfl:  .   aspirin-acetaminophen-caffeine (EXCEDRIN MIGRAINE) 250-250-65 MG tablet, Take by mouth every 6 (six) hours as needed for headache., Disp: , Rfl:  .  busPIRone (BUSPAR) 30 MG tablet, 2 (two) times daily. , Disp: , Rfl:  .  cholecalciferol (VITAMIN D) 1000 units tablet, Take 5,000 Units by mouth daily., Disp: , Rfl:  .  clomiPHENE (CLOMID) 50 MG tablet, Take 1 tablet (50 mg total) by mouth every other day., Disp: 60 tablet, Rfl: 0 .  clonazePAM (KLONOPIN) 0.5 MG tablet, , Disp: , Rfl:  .  DULoxetine (CYMBALTA) 30 MG capsule, Take 30 mg by mouth daily., Disp: , Rfl:  .  fluticasone (FLONASE) 50 MCG/ACT nasal spray, PLACE 2 SPRAYS INTO EACH NOSTRIL QD, Disp: , Rfl:  .  lamoTRIgine (LAMICTAL) 200 MG tablet, Take by mouth daily. , Disp: , Rfl:  .  lithium carbonate 300 MG capsule, Take 300 mg by mouth daily. , Disp: , Rfl:  .  Melatonin 5 MG CAPS, , Disp: , Rfl:  .  Multiple Vitamins-Minerals (MULTIVITAMIN MEN) TABS, Take 1 tablet by mouth daily., Disp: , Rfl:  .  oxyCODONE ER (XTAMPZA ER) 9 MG C12A, Take 9 mg by mouth 2 (two) times daily as needed. For chronic pain To last for 30 days from fill date, Disp: 60 each, Rfl: 0 .  SALINE MIST 0.65 % nasal spray, Place 1 application into both nostrils as directed., Disp: , Rfl:  .  sildenafil (REVATIO) 20 MG tablet, Take 40 mg by mouth., Disp: , Rfl:  .  SUMAtriptan (IMITREX) 100 MG tablet, , Disp: , Rfl:  .  traZODone (DESYREL) 100 MG tablet, Take 200 mg by mouth at bedtime., Disp: , Rfl:  .  Vitamins A & D 5000-400 units CAPS, , Disp: , Rfl:  .  buPROPion (WELLBUTRIN XL) 300 MG 24 hr tablet, Take 300 mg by mouth daily. , Disp: , Rfl:  .  oxyCODONE (OXY IR/ROXICODONE) 5 MG immediate release tablet, Take 1.5 tablets (7.5 mg total) by mouth 2 (two) times daily as needed for severe pain., Disp: 90 tablet, Rfl: 0  ROS  Constitutional: Denies any fever or chills Gastrointestinal: No reported hemesis, hematochezia, vomiting, or acute GI  distress Musculoskeletal: Denies any acute onset joint swelling, redness, loss of ROM, or weakness Neurological: No reported episodes of acute onset apraxia, aphasia, dysarthria, agnosia, amnesia, paralysis, loss of coordination, or loss of consciousness  Allergies  Johnny Villa has No Known Allergies.  PFSH  Drug: Johnny Villa  reports that he does not use drugs. Alcohol:  reports that he does not drink alcohol. Tobacco:  reports that he quit smoking about 3 years ago. His smoking use included cigarettes. He has never used smokeless tobacco. Medical:  has a past medical history of Bipolar disorder ([place]), Blood in stool, Depression, Drug abuse in remission, Fatty liver (01/2018), H/O alcohol abuse, Headache, Hypertension, and Low testosterone in male. Surgical: Johnny Villa  has a past surgical history that includes Appendectomy; Colonoscopy with propofol (N/A, 12/30/2017); and Esophagogastroduodenoscopy (egd) with propofol (N/A, 12/30/2017). Family: family history includes Alcohol abuse in his father; COPD in his father; Cancer in his father; Depression in his mother; Hyperlipidemia in his father; Hypertension in his father.  Constitutional Exam  General appearance: Well nourished, well developed, and well hydrated. In no apparent acute distress Vitals:   04/09/18 1254  BP: 125/89  Pulse: 68  Resp: 16  Temp: 98.7 F (37.1 C)  TempSrc: Oral  SpO2: 99%  Weight: 207 lb (93.9 kg)  Height: 5' 11"  (1.803 m)   BMI Assessment: Estimated body mass index is 28.87 kg/m as calculated from the following:   Height as of this encounter: 5' 11"  (1.803 m).   Weight as of this encounter: 207 lb (93.9 kg).  BMI interpretation table: BMI level Category Range association with higher incidence of chronic pain  <18 kg/m2 Underweight   18.5-24.9 kg/m2 Ideal body weight   25-29.9 kg/m2 Overweight Increased incidence by 20%  30-34.9 kg/m2 Obese (Class I) Increased incidence by 68%  35-39.9 kg/m2 Severe  obesity (Class II) Increased incidence by 136%  >40 kg/m2 Extreme obesity (Class III) Increased incidence by 254%   Patient's current BMI Ideal Body weight  Body mass index is 28.87 kg/m. Ideal body weight: 75.3 kg (166 lb 0.1 oz) Adjusted ideal body weight: 82.7 kg (182 lb 6.5 oz)   BMI Readings from Last 4 Encounters:  04/09/18 28.87 kg/m  03/17/18 28.87 kg/m  02/26/18 29.29 kg/m  02/05/18 29.29 kg/m   Wt Readings from Last 4 Encounters:  04/09/18 207 lb (93.9 kg)  03/17/18 207 lb (93.9 kg)  02/26/18 210 lb (95.3 kg)  02/05/18 210 lb (95.3 kg)  Psych/Mental status: Alert, oriented x 3 (person, place, & time)       Eyes: PERLA Respiratory: No evidence of acute respiratory distress  Cervical Spine Area Exam  Skin & Axial Inspection: No masses, redness, edema, swelling, or associated skin lesions Alignment: Symmetrical Functional ROM: Decreased ROM     R>L Stability: No instability detected Muscle Tone/Strength: Functionally intact. No obvious neuro-muscular anomalies detected. Sensory (Neurological): Musculoskeletal pain pattern Palpation: Complains of area being tender to palpation Positive provocative maneuver for for cervical facet disease  Upper Extremity (UE) Exam    Side: Right upper extremity  Side: Left upper extremity  Skin & Extremity Inspection: Skin color, temperature, and hair growth are WNL. No peripheral edema or cyanosis. No masses, redness, swelling, asymmetry, or associated skin lesions. No contractures.  Skin & Extremity Inspection: Skin color, temperature, and hair growth are WNL. No peripheral edema or cyanosis. No masses, redness, swelling, asymmetry, or associated skin lesions. No contractures.  Functional ROM: Unrestricted ROM          Functional ROM: Unrestricted ROM          Muscle Tone/Strength: Functionally intact. No obvious neuro-muscular anomalies detected.  Muscle Tone/Strength: Functionally intact. No obvious neuro-muscular anomalies detected.   Sensory (Neurological): Unimpaired          Sensory (Neurological): Unimpaired          Palpation: No palpable anomalies              Palpation: No palpable anomalies              Provocative Test(s):  Phalen's test: deferred Tinel's test: deferred Apley's scratch test (touch opposite shoulder):  Action 1 (Across chest): deferred Action 2 (Overhead): deferred Action 3 (LB  reach): deferred   Provocative Test(s):  Phalen's test: deferred Tinel's test: deferred Apley's scratch test (touch opposite shoulder):  Action 1 (Across chest): deferred Action 2 (Overhead): deferred Action 3 (LB reach): deferred    Thoracic Spine Area Exam  Skin & Axial Inspection: No masses, redness, or swelling Alignment: Symmetrical Functional ROM: Unrestricted ROM Stability: No instability detected Muscle Tone/Strength: Functionally intact. No obvious neuro-muscular anomalies detected. Sensory (Neurological): Unimpaired Muscle strength & Tone: No palpable anomalies  Lumbar Spine Area Exam  Skin & Axial Inspection: No masses, redness, or swelling Alignment: Symmetrical Functional ROM: Unrestricted ROM       Stability: No instability detected Muscle Tone/Strength: Functionally intact. No obvious neuro-muscular anomalies detected. Sensory (Neurological): Unimpaired Palpation: No palpable anomalies       Provocative Tests: Lumbar Hyperextension/rotation test: deferred today       Lumbar quadrant test (Kemp's test): deferred today       Lumbar Lateral bending test: deferred today       Patrick's Maneuver: deferred today                   FABER test: deferred today       Thigh-thrust test: deferred today       S-I compression test: deferred today       S-I distraction test: deferred today        Gait & Posture Assessment  Ambulation: Unassisted Gait: Relatively normal for age and body habitus Posture: WNL   Lower Extremity Exam    Side: Right lower extremity  Side: Left lower extremity   Stability: No instability observed          Stability: No instability observed          Skin & Extremity Inspection: Skin color, temperature, and hair growth are WNL. No peripheral edema or cyanosis. No masses, redness, swelling, asymmetry, or associated skin lesions. No contractures.  Skin & Extremity Inspection: Skin color, temperature, and hair growth are WNL. No peripheral edema or cyanosis. No masses, redness, swelling, asymmetry, or associated skin lesions. No contractures.  Functional ROM: Unrestricted ROM                  Functional ROM: Unrestricted ROM                  Muscle Tone/Strength: Functionally intact. No obvious neuro-muscular anomalies detected.  Muscle Tone/Strength: Functionally intact. No obvious neuro-muscular anomalies detected.  Sensory (Neurological): Unimpaired  Sensory (Neurological): Unimpaired  Palpation: No palpable anomalies  Palpation: No palpable anomalies   Assessment  Primary Diagnosis & Pertinent Problem List: The primary encounter diagnosis was Cervical spondylosis without myelopathy (C4,5,6,7; L>R). Diagnoses of Chronic pain syndrome and Cervical facet joint syndrome were also pertinent to this visit.  Status Diagnosis  Responding Persistent Persistent 1. Cervical spondylosis without myelopathy (C4,5,6,7; L>R)   2. Chronic pain syndrome   3. Cervical facet joint syndrome     General Recommendations: The pain condition that the patient suffers from is best treated with a multidisciplinary approach that involves an increase in physical activity to prevent de-conditioning and worsening of the pain cycle, as well as psychological counseling (formal and/or informal) to address the co-morbid psychological affects of pain. Treatment will often involve judicious use of pain medications and interventional procedures to decrease the pain, allowing the patient to participate in the physical activity that will ultimately produce long-lasting pain reductions. The  goal of the multidisciplinary approach is to return the patient to a higher level  of overall function and to restore their ability to perform activities of daily living.   42 year old male follows up status post bilateral cervical facet medial branch nerve blocks #2 at C4, 5, 6, 7.  Patient endorses approximately 70% pain relief for 7 to 10 days after his second diagnostic nerve block.  Patient states that during the 7 to 10 days, not only did he have significant pain relief in his neck and shoulder pain symptoms but he had improved range of motion and was able to complete activities of daily living with greater ease.  He states that his pain is returning and now it is almost back to what it was pre-block levels.  In regards to medication management, I had an extensive discussion with the patient about our clinic policy as well as his aberrant urine drug screen which was positive for THC.  Patient denies utilizing [facility] but states that he does use CBD for pain relief properties.  I counseled the patient on refraining from any illicit substances.  I also updated the patient on medication management plan which would only include opioid therapy for a short period of time.  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  After the ablation, my hopes are that the patient's neck and shoulder pain improve such that he is not relying on opioid therapy to manage his pain symptoms.  Given that the patient is not expressing any significant benefit with his long-acting opioid, I recommend him that we discontinue it.  I will increase his short acting oxycodone to 7.5 mill grams twice daily as needed for breakthrough pain.  Plan: -Discontinue long-acting Extampza -Prescription for Oxycodone 7.5 mg twice daily as needed for breakthrough pain -Scheduled for right C4, 5, 6, 7 cervical  facet radio frequency ablation followed by left with sedation. -Repeat urine drug screen.  Should be negative for THC.  Should be positive for oxycodone. -Patient instructed to continue physical therapy   Plan of Care  Pharmacotherapy (Medications Ordered): Meds ordered this encounter  Medications  . oxyCODONE (OXY IR/ROXICODONE) 5 MG immediate release tablet    Sig: Take 1.5 tablets (7.5 mg total) by mouth 2 (two) times daily as needed for severe pain.    Dispense:  90 tablet    Refill:  0    Do not place this medication, or any other prescription from our practice, on "Automatic Refill". Patient may have prescription filled one day early if pharmacy is closed on scheduled refill date. Do not fill until:  To last until:   Lab-work, procedure(s), and/or referral(s): Orders Placed This Encounter  Procedures  . Radiofrequency,Cervical  . Compliance Drug Analysis, Ur   Time Note: Greater than 50% of the 25 minute(s) of face-to-face time spent with Johnny Villa, was spent in counseling/coordination of care regarding: the appropriate use of the pain scale, Johnny Villa primary cause of pain, the treatment plan, treatment alternatives, the risks and possible complications of proposed treatment, medication side effects, going over the informed consent, the opioid analgesic risks and possible complications, the results, interpretation and significance of  his recent diagnostic interventional treatment(s), the appropriate use of his medications, realistic expectations, the goals of pain management (increased in functionality), the medication agreement and the patient's responsibilities when it comes to controlled substances. Provider-requested follow-up: Return in about 2 weeks (around 04/23/2018).  No future appointments.  Primary Care Physician: McLean-Scocuzza, Nino Glow, MD Location: [facility] Note by: Gillis Santa, M.D Date: 04/09/2018; Time: 4:10 PM  Patient  Instructions  ____________________________________________________________________________________________  General Risks and Possible Complications  Patient Responsibilities: It is important that you read this as it is part of your informed consent. It is our duty to inform you of the risks and possible complications associated with treatments offered to you. It is your responsibility as a patient to read this and to ask questions about anything that is not clear or that you believe was not covered in this document.  Patient's Rights: You have the right to refuse treatment. You also have the right to change your mind, even after initially having agreed to have the treatment done. However, under this last option, if you wait until the last second to change your mind, you may be charged for the materials used up to that point.  Introduction: Medicine is not an Chief Strategy Officer. Everything in Medicine, including the lack of treatment(s), carries the potential for danger, harm, or loss (which is by definition: Risk). In Medicine, a complication is a secondary problem, condition, or disease that can aggravate an already existing one. All treatments carry the risk of possible complications. The fact that a side effects or complications occurs, does not imply that the treatment was conducted incorrectly. It must be clearly understood that these can happen even when everything is done following the highest safety standards.  No treatment: You can choose not to proceed with the proposed treatment alternative. The "PRO(s)" would include: avoiding the risk of complications associated with the therapy. The "CON(s)" would include: not getting any of the treatment benefits. These benefits fall under one of three categories: diagnostic; therapeutic; and/or palliative. Diagnostic benefits include: getting information which can ultimately lead to improvement of the disease or symptom(s). Therapeutic benefits are those  associated with the successful treatment of the disease. Finally, palliative benefits are those related to the decrease of the primary symptoms, without necessarily curing the condition (example: decreasing the pain from a flare-up of a chronic condition, such as incurable terminal cancer).  General Risks and Complications: These are associated to most interventional treatments. They can occur alone, or in combination. They fall under one of the following six (6) categories: no benefit or worsening of symptoms; bleeding; infection; nerve damage; allergic reactions; and/or death. 1. No benefits or worsening of symptoms: In Medicine there are no guarantees, only probabilities. No healthcare provider can ever guarantee that a medical treatment will work, they can only state the probability that it may. Furthermore, there is always the possibility that the condition may worsen, either directly, or indirectly, as a consequence of the treatment. 2. Bleeding: This is more common if the patient is taking a blood thinner, either prescription or over the counter (example: Goody Powders, Fish oil, Aspirin, Garlic, etc.), or if suffering a condition associated with impaired coagulation (example: Hemophilia, cirrhosis of the liver, low platelet counts, etc.). However, even if you do not have one on these, it can still happen. If you have any of these conditions, or take one of these drugs, make sure to notify your treating physician. 3. Infection: This is more common in patients with a compromised immune system, either due to disease (example: diabetes, cancer, human immunodeficiency virus [HIV], etc.), or due to medications or treatments (example: therapies used to treat cancer and rheumatological diseases). However, even if you do not have one on these, it can still happen. If  you have any of these conditions, or take one of these drugs, make sure to notify your treating physician. 4. Nerve Damage: This is more common  when the treatment is an invasive one, but it can also happen with the use of medications, such as those used in the treatment of cancer. The damage can occur to small secondary nerves, or to large primary ones, such as those in the spinal cord and brain. This damage may be temporary or permanent and it may lead to impairments that can range from temporary numbness to permanent paralysis and/or brain death. 5. Allergic Reactions: Any time a substance or material comes in contact with our body, there is the possibility of an allergic reaction. These can range from a mild skin rash (contact dermatitis) to a severe systemic reaction (anaphylactic reaction), which can result in death. 6. Death: In general, any medical intervention can result in death, most of the time due to an unforeseen complication. ____________________________________________________________________________________________  Radiofrequency Lesioning Radiofrequency lesioning is a procedure that is performed to relieve pain. The procedure is often used for back, neck, or arm pain. Radiofrequency lesioning involves the use of a machine that creates radio waves to make heat. During the procedure, the heat is applied to the nerve that carries the pain signal. The heat damages the nerve and interferes with the pain signal. Pain relief usually starts about 2 weeks after the procedure and lasts for 6 months to 1 year. Tell a health care provider about:  Any allergies you have.  All medicines you are taking, including vitamins, herbs, eye drops, creams, and over-the-counter medicines.  Any problems you or family members have had with anesthetic medicines.  Any blood disorders you have.  Any surgeries you have had.  Any medical conditions you have.  Whether you are pregnant or may be pregnant. What are the risks? Generally, this is a safe procedure. However, problems may occur, including:  Pain or soreness at the injection  site.  Infection at the injection site.  Damage to nerves or blood vessels.  What happens before the procedure?  Ask your health care provider about: ? Changing or stopping your regular medicines. This is especially important if you are taking diabetes medicines or blood thinners. ? Taking medicines such as aspirin and ibuprofen. These medicines can thin your blood. Do not take these medicines before your procedure if your health care provider instructs you not to.  Follow instructions from your health care provider about eating or drinking restrictions.  Plan to have someone take you home after the procedure.  If you go home right after the procedure, plan to have someone with you for 24 hours. What happens during the procedure?  You will be given one or more of the following: ? A medicine to help you relax (sedative). ? A medicine to numb the area (local anesthetic).  You will be awake during the procedure. You will need to be able to talk with the health care provider during the procedure.  With the help of a type of X-ray (fluoroscopy), the health care provider will insert a radiofrequency needle into the area to be treated.  Next, a wire that carries the radio waves (electrode) will be put through the radiofrequency needle. An electrical pulse will be sent through the electrode to verify the correct nerve. You will feel a tingling sensation, and you may have muscle twitching.  Then, the tissue that is around the needle tip will be heated by an electric current  that is passed using the radiofrequency machine. This will numb the nerves.  A bandage (dressing) will be put on the insertion area after the procedure is done. The procedure may vary among health care providers and hospitals. What happens after the procedure?  Your blood pressure, heart rate, breathing rate, and blood oxygen level will be monitored often until the medicines you were given have worn off.  Return to  your normal activities as directed by your health care provider. This information is not intended to replace advice given to you by your health care provider. Make sure you discuss any questions you have with your health care provider. Document Released: 05/16/2011 Document Revised: 02/23/2016 Document Reviewed: 10/25/2014 Elsevier Interactive Patient Education  2018 [place].   oyxcodone 5 mg 1.5 mg tablets x 2 times daily prn pain

## 2018-04-15 LAB — COMPLIANCE DRUG ANALYSIS, UR

## 2018-04-19 ENCOUNTER — Encounter: Payer: Self-pay | Admitting: Student in an Organized Health Care Education/Training Program

## 2018-04-21 ENCOUNTER — Encounter: Payer: Self-pay | Admitting: Student in an Organized Health Care Education/Training Program

## 2018-05-02 ENCOUNTER — Other Ambulatory Visit
Admission: RE | Admit: 2018-05-02 | Discharge: 2018-05-02 | Disposition: A | Discharge status: Home / Self Care | Payer: Medicare PPO | Source: Ambulatory Visit | Attending: Gastroenterology | Admitting: Gastroenterology

## 2018-05-02 ENCOUNTER — Other Ambulatory Visit: Payer: Self-pay

## 2018-05-02 ENCOUNTER — Ambulatory Visit: Payer: Medicare PPO | Admitting: Gastroenterology

## 2018-05-02 ENCOUNTER — Encounter: Payer: Self-pay | Admitting: Gastroenterology

## 2018-05-02 VITALS — BP 123/80 | HR 63 | Resp 16 | Ht 71.0 in | Wt 210.4 lb

## 2018-05-02 DIAGNOSIS — R197 Diarrhea, unspecified: Secondary | ICD-10-CM | POA: Diagnosis present

## 2018-05-02 LAB — GASTROINTESTINAL PANEL BY PCR, STOOL (REPLACES STOOL CULTURE)

## 2018-05-02 LAB — C DIFFICILE QUICK SCREEN W PCR REFLEX
C Diff antigen: NEGATIVE
C Diff interpretation: NOT DETECTED
C Diff toxin: NEGATIVE

## 2018-05-02 MED ORDER — DICYCLOMINE HCL 10 MG PO CAPS: 20.0000 mg | ORAL_CAPSULE | Freq: Three times a day (TID) | ORAL | 0 refills | Status: DC

## 2018-05-02 NOTE — Progress Notes (Signed)
Sharon, MD [address]  [place]  [place], [place] [address]  Main: [phone]  Fax: [phone]    Gastroenterology Consultation  Referring Provider:     McLean-Scocuzza, Olivia Mackie * Primary Care Physician:  McLean-Scocuzza, Nino Glow, MD Primary Gastroenterologist:  Dr. Cephas Darby Reason for Consultation:   Acute diarrhea        HPI:   Johnny Villa is a 42 y.o. male referred by Dr. Terese Door, Nino Glow, MD  for consultation & management of anal fissure and dark stools. He was recently in the ER last week of february secondary to painful bowel movements which were dark colored. Subsequent to that his bowel movements were more and more painful with severe sharp pain right at his anus and he had bright red blood, the patient was acutely relieved after a rupture of the hemorrhoid. He has a long-standing history of arthritis and takes meloxicam on a chronic basis.  He does report constipation. He is concerned because he has history of hemorrhoids. External hemorrhoids and Anal fissure were visualized by the ER physician, his CBC was unremarkable, was discharged home on anusol cream and sitz baths. He is doing only sitz baths, could not afford Anusol cream. He reports that pain is significantly better as long as he is keeping bowel movements regular. Patient is currently on fiber supplements and stool softeners twice daily is keeping his bowels regular and formed. He reports that is the first time ever he had regular bowel movements. Generally, he is constipated, irregular bowel movements every 3-4 days, associated with significant straining. He also reports heartburn and epigastric pain for which he is taking Protonix 40 mg daily started by the ER physician. These symptoms are partially relieved. He reports stopping meloxicam since ER visit.  Follow-up visit 01/21/2018 Since last visit, patient's anal fissure has resolved with application of topical nitroglycerin.  He underwent EGD and colonoscopy which were essentially negative except for small tubular adenoma which was removed. He reports not having a good appetite. He does not have a desire to eat and feels very stressful taking care of 3 kids of his girlfriend although he enjoys having their presence. He is planning to go on a vacation in next couple of weeks. He reports having infrequent bowel movements and he thinks because he is not eating well. He denies lumpy hard stools or pushing/straining. He lost about 5 pounds in last 4 weeks due to poor appetite. Patient reports that his weight has been fluctuating over long time whenever he is stressed out and his appetite drops. He otherwise does not have any GI complaints today. He doesn't really have upper GI symptoms anymore  Follow-up visit 05/02/2018 Patient reports approximately 1-2 weeks history of sudden onset of watery diarrhea associated with some streaks of blood, abdominal cramps. He reports that he ate hamburger, sushi and Pizza prior to the onset of symptoms. He denies nausea, vomiting, abdominal pain or weight loss.  NSAIDs: meloxicam for arthritis  Antiplts/Anticoagulants/Anti thrombotics: none  GI Procedures: EGD and colon 12/30/17 DIAGNOSIS:  A. STOMACH; COLD BIOPSY:  - ANTRAL MUCOSA WITH SUPERFICIAL VASCULAR CONGESTION AND MILD FOVEOLAR  HYPERPLASIA.  - OXYNTIC MUCOSAL PROTON PUMP INHIBITOR EFFECT.  - NEGATIVE FOR H. PYLORI, DYSPLASIA AND MALIGNANCY.   B. COLON POLYP, ASCENDING; COLD BIOPSY:  - NO PATHOLOGIC CHANGE.   C. COLON POLYP, SIGMOID; COLD SNARE:  - TUBULAR ADENOMA.  - NEGATIVE FOR HIGH-GRADE DYSPLASIA AND MALIGNANCY.   He denies family history  of GI malignancy He does not smoke or drink alcohol  Past Medical History:  Diagnosis Date  . Bipolar disorder [facility])    psychiatrist in [place]  . Blood in stool   . Depression   . Drug abuse in remission    sober for 6 years  . Fatty liver 01/2018  . H/O alcohol abuse   .  Headache    migraines  . Hypertension   . Low testosterone in male     Past Surgical History:  Procedure Laterality Date  . APPENDECTOMY     2006  . COLONOSCOPY WITH PROPOFOL N/A 12/30/2017   Procedure: COLONOSCOPY WITH PROPOFOL;  Surgeon: Lin Landsman, MD;  Location: [facility] ENDOSCOPY;  Service: Gastroenterology;  Laterality: N/A;  . ESOPHAGOGASTRODUODENOSCOPY (EGD) WITH PROPOFOL N/A 12/30/2017   Procedure: ESOPHAGOGASTRODUODENOSCOPY (EGD) WITH PROPOFOL;  Surgeon: Lin Landsman, MD;  Location[facility] ENDOSCOPY;  Service: Gastroenterology;  Laterality: N/A;     Current Outpatient Medications:  .  acetaminophen (TYLENOL) 500 MG tablet, Take 500 mg by mouth every 6 (six) hours as needed., Disp: , Rfl:  .  busPIRone (BUSPAR) 30 MG tablet, 2 (two) times daily. , Disp: , Rfl:  .  cholecalciferol (VITAMIN D) 1000 units tablet, Take 5,000 Units by mouth daily., Disp: , Rfl:  .  clomiPHENE (CLOMID) 50 MG tablet, Take 1 tablet (50 mg total) by mouth every other day., Disp: 60 tablet, Rfl: 0 .  clonazePAM (KLONOPIN) 0.5 MG tablet, , Disp: , Rfl:  .  DULoxetine (CYMBALTA) 30 MG capsule, Take 30 mg by mouth daily., Disp: , Rfl:  .  fluticasone (FLONASE) 50 MCG/ACT nasal spray, PLACE 2 SPRAYS INTO EACH NOSTRIL QD, Disp: , Rfl:  .  lamoTRIgine (LAMICTAL) 200 MG tablet, Take by mouth daily. , Disp: , Rfl:  .  lithium carbonate 300 MG capsule, Take 300 mg by mouth daily. , Disp: , Rfl:  .  Melatonin 5 MG CAPS, , Disp: , Rfl:  .  Multiple Vitamins-Minerals (MULTIVITAMIN MEN) TABS, Take 1 tablet by mouth daily., Disp: , Rfl:  .  oxyCODONE (OXY IR/ROXICODONE) 5 MG immediate release tablet, Take 1.5 tablets (7.5 mg total) by mouth 2 (two) times daily as needed for severe pain., Disp: 90 tablet, Rfl: 0 .  oxyCODONE ER (XTAMPZA ER) 9 MG C12A, Take 9 mg by mouth 2 (two) times daily as needed. For chronic pain To last for 30 days from fill date, Disp: 60 each, Rfl: 0 .  SALINE MIST 0.65 % nasal spray,  Place 1 application into both nostrils as directed., Disp: , Rfl:  .  sildenafil (REVATIO) 20 MG tablet, Take 40 mg by mouth., Disp: , Rfl:  .  SUMAtriptan (IMITREX) 100 MG tablet, , Disp: , Rfl:  .  traZODone (DESYREL) 100 MG tablet, Take 200 mg by mouth at bedtime., Disp: , Rfl:  .  albuterol (PROVENTIL HFA) 108 (90 Base) MCG/ACT inhaler, Inhale into the lungs., Disp: , Rfl:  .  aspirin-acetaminophen-caffeine (EXCEDRIN MIGRAINE) 250-250-65 MG tablet, Take by mouth every 6 (six) hours as needed for headache., Disp: , Rfl:  .  buPROPion (WELLBUTRIN XL) 300 MG 24 hr tablet, Take 300 mg by mouth daily. , Disp: , Rfl:  .  dicyclomine (BENTYL) 10 MG capsule, Take 2 capsules (20 mg total) by mouth 4 (four) times daily -  before meals and at bedtime., Disp: 240 capsule, Rfl: 0 .  Vitamins A & D 5000-400 units CAPS, , Disp: , Rfl:  Family History  Problem Relation Age of Onset  . Depression Mother   . Alcohol abuse Father   . Cancer Father        prostate dx'ed 37  . COPD Father   . Hyperlipidemia Father   . Hypertension Father      Social History   Tobacco Use  . Smoking status: Former Smoker    Types: Cigarettes    Last attempt to quit: 2016    Years since quitting: 3.5  . Smokeless tobacco: Never Used  Substance Use Topics  . Alcohol use: No    Frequency: Never    Comment: former quit 03/14/2012  . Drug use: No    Comment: former quit cocaine 03/14/2012     Allergies as of 05/02/2018  . (No Known Allergies)    Review of Systems:    All systems reviewed and negative except where noted in HPI.   Physical Exam:  BP 123/80 (BP Location: Left Arm, Patient Position: Sitting, Cuff Size: Large)   Pulse 63   Resp 16   Ht 5\' 11"  (1.803 m)   Wt 210 lb 6.4 oz (95.4 kg)   BMI 29.34 kg/m  No LMP for male patient.  General:   Alert,  Well-developed, well-nourished, pleasant and cooperative in NAD Head:  Normocephalic and atraumatic. Eyes:  Sclera clear, no icterus.   Conjunctiva  pink. Ears:  Normal auditory acuity. Nose:  No deformity, discharge, or lesions. Mouth:  No deformity or lesions,oropharynx pink & moist. Neck:  Supple; no masses or thyromegaly. Lungs:  Respirations even and unlabored.  Clear throughout to auscultation.   No wheezes, crackles, or rhonchi. No acute distress. Heart:  Regular rate and rhythm; no murmurs, clicks, rubs, or gallops. Abdomen:  Normal bowel sounds. Soft, non-tender and non-distended without masses, hepatosplenomegaly or hernias noted.  No guarding or rebound tenderness.   Rectal: Not performed Msk:  Symmetrical without gross deformities. Good, equal movement & strength bilaterally. Pulses:  Normal pulses noted. Extremities:  No clubbing or edema.  No cyanosis. Neurologic:  Alert and oriented x3;  grossly normal neurologically. Skin:  Intact without significant lesions or rashes. No jaundice. Lymph Nodes:  No significant cervical adenopathy. Psych:  Alert and cooperative. Normal mood and affect.  Imaging Studies: No abdominal imaging  Assessment and Plan:   Jasani Dolney is a 42 y.o. male with history of bipolar, chronic constipation, epigastric pain, heartburn, symptomatic external hemorrhoids, rectal bleeding and anal fissure. He now presents for follow-up of acute diarrhea  Acute diarrhea: Rule out infectious etiology Stool studies ordered Patient is going out of town and requests to have Bentyl prescribed  Follow up in 4 weeks   Cephas Darby, MD

## 2018-05-19 ENCOUNTER — Other Ambulatory Visit: Payer: Self-pay

## 2018-05-19 ENCOUNTER — Ambulatory Visit
Admission: RE | Admit: 2018-05-19 | Discharge: 2018-05-19 | Disposition: A | Discharge status: Home / Self Care | Payer: Medicare PPO | Source: Ambulatory Visit | Attending: Student in an Organized Health Care Education/Training Program | Admitting: Student in an Organized Health Care Education/Training Program

## 2018-05-19 ENCOUNTER — Encounter: Payer: Self-pay | Admitting: Student in an Organized Health Care Education/Training Program

## 2018-05-19 ENCOUNTER — Ambulatory Visit (HOSPITAL_BASED_OUTPATIENT_CLINIC_OR_DEPARTMENT_OTHER): Payer: Medicare PPO | Admitting: Student in an Organized Health Care Education/Training Program

## 2018-05-19 DIAGNOSIS — M545 Low back pain: Secondary | ICD-10-CM | POA: Diagnosis present

## 2018-05-19 DIAGNOSIS — M47812 Spondylosis without myelopathy or radiculopathy, cervical region: Secondary | ICD-10-CM | POA: Insufficient documentation

## 2018-05-19 DIAGNOSIS — M542 Cervicalgia: Secondary | ICD-10-CM | POA: Insufficient documentation

## 2018-05-19 DIAGNOSIS — G8929 Other chronic pain: Secondary | ICD-10-CM | POA: Insufficient documentation

## 2018-05-19 MED ORDER — OXYCODONE HCL 5 MG PO TABS: 7.5000 mg | ORAL_TABLET | Freq: Two times a day (BID) | ORAL | 0 refills | Status: DC | PRN

## 2018-05-19 MED ORDER — LIDOCAINE HCL 2 % IJ SOLN
20.0000 mL | Freq: Once | INTRAMUSCULAR | Status: AC
  Administered 2018-05-19: 400 mg
  Filled 2018-05-19: qty 40

## 2018-05-19 MED ORDER — DEXAMETHASONE SODIUM PHOSPHATE 10 MG/ML IJ SOLN
10.0000 mg | Freq: Once | INTRAMUSCULAR | Status: AC
  Administered 2018-05-19: 10 mg
  Filled 2018-05-19: qty 1

## 2018-05-19 MED ORDER — LACTATED RINGERS IV SOLN
1000.0000 mL | Freq: Once | INTRAVENOUS | Status: AC
  Administered 2018-05-19: 1000 mL via INTRAVENOUS

## 2018-05-19 MED ORDER — SUMATRIPTAN SUCCINATE 100 MG PO TABS: 100.0000 mg | ORAL_TABLET | ORAL | 1 refills | Status: DC | PRN

## 2018-05-19 MED ORDER — ROPIVACAINE HCL 2 MG/ML IJ SOLN
10.0000 mL | Freq: Once | INTRAMUSCULAR | Status: AC
  Administered 2018-05-19: 10 mL
  Filled 2018-05-19: qty 10

## 2018-05-19 MED ORDER — FENTANYL CITRATE (PF) 100 MCG/2ML IJ SOLN
25.0000 ug | INTRAMUSCULAR | Status: DC | PRN
  Administered 2018-05-19: 100 ug via INTRAVENOUS
  Filled 2018-05-19: qty 2

## 2018-05-19 NOTE — Patient Instructions (Signed)
Pain Management Discharge Instructions  General Discharge Instructions :  If you need to reach your doctor call: Monday-Friday 8:00 am - 4:00 pm at [phone] or toll free [phone].  After clinic hours [phone] to have operator reach doctor.  Bring all of your medication bottles to all your appointments in the pain clinic.  To cancel or reschedule your appointment with Pain Management please remember to call 24 hours in advance to avoid a fee.  Refer to the educational materials which you have been given on: General Risks, I had my Procedure. Discharge Instructions, Post Sedation.  Post Procedure Instructions:  The drugs you were given will stay in your system until tomorrow, so for the next 24 hours you should not drive, make any legal decisions or drink any alcoholic beverages.  You may eat anything you prefer, but it is better to start with liquids then soups and crackers, and gradually work up to solid foods.  Please notify your doctor immediately if you have any unusual bleeding, trouble breathing or pain that is not related to your normal pain.  Depending on the type of procedure that was done, some parts of your body may feel week and/or numb.  This usually clears up by tonight or the next day.  Walk with the use of an assistive device or accompanied by an adult for the 24 hours.  You may use ice on the affected area for the first 24 hours.  Put ice in a Ziploc bag and cover with a towel and place against area 15 minutes on 15 minutes off.  You may switch to heat after 24 hours.Radiofrequency Lesioning Radiofrequency lesioning is a procedure that is performed to relieve pain. The procedure is often used for back, neck, or arm pain. Radiofrequency lesioning involves the use of a machine that creates radio waves to make heat. During the procedure, the heat is applied to the nerve that carries the pain signal. The heat damages the nerve and interferes with the pain signal.  Pain relief usually starts about 2 weeks after the procedure and lasts for 6 months to 1 year. Tell a health care provider about:  Any allergies you have.  All medicines you are taking, including vitamins, herbs, eye drops, creams, and over-the-counter medicines.  Any problems you or family members have had with anesthetic medicines.  Any blood disorders you have.  Any surgeries you have had.  Any medical conditions you have.  Whether you are pregnant or may be pregnant. What are the risks? Generally, this is a safe procedure. However, problems may occur, including:  Pain or soreness at the injection site.  Infection at the injection site.  Damage to nerves or blood vessels.  What happens before the procedure?  Ask your health care provider about: ? Changing or stopping your regular medicines. This is especially important if you are taking diabetes medicines or blood thinners. ? Taking medicines such as aspirin and ibuprofen. These medicines can thin your blood. Do not take these medicines before your procedure if your health care provider instructs you not to.  Follow instructions from your health care provider about eating or drinking restrictions.  Plan to have someone take you home after the procedure.  If you go home right after the procedure, plan to have someone with you for 24 hours. What happens during the procedure?  You will be given one or more of the following: ? A medicine to help you relax (sedative). ? A medicine to numb the area (  local anesthetic).  You will be awake during the procedure. You will need to be able to talk with the health care provider during the procedure.  With the help of a type of X-ray (fluoroscopy), the health care provider will insert a radiofrequency needle into the area to be treated.  Next, a wire that carries the radio waves (electrode) will be put through the radiofrequency needle. An electrical pulse will be sent through the  electrode to verify the correct nerve. You will feel a tingling sensation, and you may have muscle twitching.  Then, the tissue that is around the needle tip will be heated by an electric current that is passed using the radiofrequency machine. This will numb the nerves.  A bandage (dressing) will be put on the insertion area after the procedure is done. The procedure may vary among health care providers and hospitals. What happens after the procedure?  Your blood pressure, heart rate, breathing rate, and blood oxygen level will be monitored often until the medicines you were given have worn off.  Return to your normal activities as directed by your health care provider. This information is not intended to replace advice given to you by your health care provider. Make sure you discuss any questions you have with your health care provider. Document Released: 05/16/2011 Document Revised: 02/23/2016 Document Reviewed: 10/25/2014 [employer] Interactive Patient Education  [employer].

## 2018-05-19 NOTE — Progress Notes (Signed)
Patient's Name: Johnny Villa  MRN: [id number]  Referring Provider: Gillis Santa, MD  DOB: 1976/05/12  PCP: McLean-Scocuzza, Nino Glow, MD  DOS: 05/19/2018  Note by: Gillis Santa, MD  Service setting: Ambulatory outpatient  Specialty: Interventional Pain Management  Patient type: Established  Location: [facility] (AMB) Pain Management Facility  Visit type: Interventional Procedure   Primary Reason for Visit: Interventional Pain Management Treatment. CC: Neck Pain and Back Pain (lower)  Procedure:          Anesthesia, Analgesia, Anxiolysis:  Type: Cervical Facet, Medial Branch Radiofrequency Ablation Primary Purpose: Therapeutic Region: Posterolateral cervical spine region Level: C4, C5, C6, & C7 Medial Branch Level(s). Lesioning of these levels should completely denervate the C4-5, C5-6, and the C6-7 cervical facet joints. Laterality: Right Paraspinal  Type: Moderate (Conscious) Sedation combined with Local Anesthesia Indication(s): Analgesia and Anxiety Route: Intravenous (IV) IV Access: Secured Sedation: Meaningful verbal contact was maintained at all times during the procedure  Local Anesthetic: Lidocaine 1-2%  Position: Prone with head of the table was raised to facilitate breathing.   Indications: 1. Cervical spondylosis without myelopathy (C4,5,6,7; L>R)   2. Cervical facet joint syndrome    [name] has been dealing with the above chronic pain for longer than three months and has either failed to respond, was unable to tolerate, or simply did not get enough benefit from other more conservative therapies including, but not limited to: 1. Over-the-counter medications 2. Anti-inflammatory medications 3. Muscle relaxants 4. Membrane stabilizers 5. Opioids 6. Physical therapy 7. Modalities (Heat, ice, etc.) 8. Invasive techniques such as nerve blocks. [name] has attained more than 50% relief of the pain from a series of diagnostic injections conducted in separate  occasions.  Pain Score: Pre-procedure: 8 /10 Post-procedure: 2 /10  Pre-op Assessment:  Johnny Villa is a 42 y.o. (year old), male patient, seen today for interventional treatment. He  has a past surgical history that includes Appendectomy; Colonoscopy with propofol (N/A, 12/30/2017); and Esophagogastroduodenoscopy (egd) with propofol (N/A, 12/30/2017). Johnny Villa has a current medication list which includes the following prescription(s): acetaminophen, buspirone, cholecalciferol, clomiphene, clonazepam, dicyclomine, duloxetine, fluticasone, lamotrigine, lisdexamfetamine, lithium carbonate, melatonin, multivitamin men, oxycodone, saline mist, sildenafil, sumatriptan, trazodone, albuterol, aspirin-acetaminophen-caffeine, bupropion, and vitamins a & d, and the following Facility-Administered Medications: fentanyl. His primarily concern today is the Neck Pain and Back Pain (lower)  Initial Vital Signs:  Pulse/HCG Rate: 68ECG Heart Rate: 69 Temp: 98.8 F (37.1 C) Resp: 16 BP: (!) 147/100 SpO2: 100 %  BMI: Estimated body mass index is 29.99 kg/m as calculated from the following:   Height as of this encounter: 5\' 11"  (1.803 m).   Weight as of this encounter: 215 lb (97.5 kg).  Risk Assessment: Allergies: Reviewed. He has No Known Allergies.  Allergy Precautions: None required Coagulopathies: Reviewed. None identified.  Blood-thinner therapy: None at this time Active Infection(s): Reviewed. None identified. Mr. Kirk is afebrile  Site Confirmation: Johnny Villa was asked to confirm the procedure and laterality before marking the site Procedure checklist: Completed Consent: Before the procedure and under the influence of no sedative(s), amnesic(s), or anxiolytics, the patient was informed of the treatment options, risks and possible complications. To fulfill our ethical and legal obligations, as recommended by the American Medical Association's Code of Ethics, I have informed the patient of my  clinical impression; the nature and purpose of the treatment or procedure; the risks, benefits, and possible complications of the intervention; the alternatives, including doing nothing; the risk(s) and benefit(s) of the  alternative treatment(s) or procedure(s); and the risk(s) and benefit(s) of doing nothing. The patient was provided information about the general risks and possible complications associated with the procedure. These may include, but are not limited to: failure to achieve desired goals, infection, bleeding, organ or nerve damage, allergic reactions, paralysis, and death. In addition, the patient was informed of those risks and complications associated to Spine-related procedures, such as failure to decrease pain; infection (i.e.: Meningitis, epidural or intraspinal abscess); bleeding (i.e.: epidural hematoma, subarachnoid hemorrhage, or any other type of intraspinal or peri-dural bleeding); organ or nerve damage (i.e.: Any type of peripheral nerve, nerve root, or spinal cord injury) with subsequent damage to sensory, motor, and/or autonomic systems, resulting in permanent pain, numbness, and/or weakness of one or several areas of the body; allergic reactions; (i.e.: anaphylactic reaction); and/or death. Furthermore, the patient was informed of those risks and complications associated with the medications. These include, but are not limited to: allergic reactions (i.e.: anaphylactic or anaphylactoid reaction(s)); adrenal axis suppression; blood sugar elevation that in diabetics may result in ketoacidosis or comma; water retention that in patients with history of congestive heart failure may result in shortness of breath, pulmonary edema, and decompensation with resultant heart failure; weight gain; swelling or edema; medication-induced neural toxicity; particulate matter embolism and blood vessel occlusion with resultant organ, and/or nervous system infarction; and/or aseptic necrosis of one or  more joints. Finally, the patient was informed that Medicine is not an exact science; therefore, there is also the possibility of unforeseen or unpredictable risks and/or possible complications that may result in a catastrophic outcome. The patient indicated having understood very clearly. We have given the patient no guarantees and we have made no promises. Enough time was given to the patient to ask questions, all of which were answered to the patient's satisfaction. Mr. Malburg has indicated that he wanted to continue with the procedure. Attestation: I, the ordering provider, attest that I have discussed with the patient the benefits, risks, side-effects, alternatives, likelihood of achieving goals, and potential problems during recovery for the procedure that I have provided informed consent. Date  Time: 05/19/2018  7:59 AM  Pre-Procedure Preparation:  Monitoring: As per clinic protocol. Respiration, ETCO2, SpO2, BP, heart rate and rhythm monitor placed and checked for adequate function Safety Precautions: Patient was assessed for positional comfort and pressure points before starting the procedure. Time-out: I initiated and conducted the "Time-out" before starting the procedure, as per protocol. The patient was asked to participate by confirming the accuracy of the "Time Out" information. Verification of the correct person, site, and procedure were performed and confirmed by me, the nursing staff, and the patient. "Time-out" conducted as per Joint Commission's Universal Protocol (UP.01.01.01). Time: 62  Description of Procedure:          Laterality: Right Level: C4, C5, C6, & C7 Medial Branch Level(s). Area Prepped: Entire Posterior Cervico-thoracic Region Prepping solution: ChloraPrep (2% chlorhexidine gluconate and 70% isopropyl alcohol) Safety Precautions: Aspiration looking for blood return was conducted prior to all injections. At no point did we inject any substances, as a needle was  being advanced. Before injecting, the patient was told to immediately notify me if he was experiencing any new onset of "ringing in the ears, or metallic taste in the mouth". No attempts were made at seeking any paresthesias. Safe injection practices and needle disposal techniques used. Medications properly checked for expiration dates. SDV (single dose vial) medications used. After the completion of the procedure, all disposable equipment  used was discarded in the proper designated medical waste containers. Local Anesthesia: Protocol guidelines were followed. The patient was positioned over the fluoroscopy table. The area was prepped in the usual manner. The time-out was completed. The target area was identified using fluoroscopy. A 12-in long, straight, sterile hemostat was used with fluoroscopic guidance to locate the targets for each level blocked. Once located, the skin was marked with an approved surgical skin marker. Once all sites were marked, the skin (epidermis, dermis, and hypodermis), as well as deeper tissues (fat, connective tissue and muscle) were infiltrated with a small amount of a short-acting local anesthetic, loaded on a 10cc syringe with a 25G, 1.5-in  Needle. An appropriate amount of time was allowed for local anesthetics to take effect before proceeding to the next step. Local Anesthetic: Lidocaine 2.0% The unused portion of the local anesthetic was discarded in the proper designated containers. Technical explanation of process:  Radiofrequency Ablation (RFA)  C4 Medial Branch Nerve RFA: The target area for the C4 dorsal medial articular branch is the lateral concave waist of the articular pillar of C4. Under fluoroscopic guidance, a Radiofrequency needle was inserted until contact was made with os over the postero-lateral aspect of the articular pillar of C4 (target area). Sensory and motor testing was conducted to properly adjust the position of the needle. Once satisfactory placement  of the needle was achieved, the numbing solution was slowly injected after negative aspiration for blood. 38mL of the nerve block solution was injected without difficulty or complication. After waiting for at least 3 minutes, the ablation was performed. Once completed, the needle was removed intact. C5 Medial Branch Nerve RFA: The target area for the C5 dorsal medial articular branch is the lateral concave waist of the articular pillar of C5. Under fluoroscopic guidance, a Radiofrequency needle was inserted until contact was made with os over the postero-lateral aspect of the articular pillar of C5 (target area). Sensory and motor testing was conducted to properly adjust the position of the needle. Once satisfactory placement of the needle was achieved, the numbing solution was slowly injected after negative aspiration for blood. 3mL of the nerve block solution was injected without difficulty or complication. After waiting for at least 3 minutes, the ablation was performed. Once completed, the needle was removed intact. C6 Medial Branch Nerve RFA: The target area for the C6 dorsal medial articular branch is the lateral concave waist of the articular pillar of C6. Under fluoroscopic guidance, a Radiofrequency needle was inserted until contact was made with os over the postero-lateral aspect of the articular pillar of C6 (target area). Sensory and motor testing was conducted to properly adjust the position of the needle. Once satisfactory placement of the needle was achieved, the numbing solution was slowly injected after negative aspiration for blood. 1 mL of the nerve block solution was injected without difficulty or complication. After waiting for at least 3 minutes, the ablation was performed. Once completed, the needle was removed intact. C7 Medial Branch Nerve RFA: The target for the C7 dorsal medial articular branch lies on the superior-medial tip of the C7 transverse process. Under fluoroscopic guidance, a  Radiofrequency needle was inserted until contact was made with os over the postero-lateral aspect of the articular pillar of C7 (target area). Sensory and motor testing was conducted to properly adjust the position of the needle. Once satisfactory placement of the needle was achieved, the numbing solution was slowly injected after negative aspiration for blood. 1 mL of the nerve block  solution was injected without difficulty or complication. After waiting for at least 3 minutes, the ablation was performed. Once completed, the needle was removed intact.  Radiofrequency lesioning (ablation):  Radiofrequency Generator: NeuroTherm [id number] Sensory Stimulation Parameters: 50 Hz was used to locate & identify the nerve, making sure that the needle was positioned such that there was no sensory stimulation below 0.3 V or above 0.7 V. Motor Stimulation Parameters: 2 Hz was used to evaluate the motor component. Care was taken not to lesion any nerves that demonstrated motor stimulation of the lower extremities at an output of less than 2.5 times that of the sensory threshold, or a maximum of 2.0 V. Lesioning Technique Parameters: Standard Radiofrequency settings. (Not bipolar or pulsed.) Temperature Settings: 80 degrees C Lesioning time: 60 seconds Intra-operative Compliance: Compliant Materials & Medications: Needle(s) (Electrode/Cannula) Type: Teflon-coated, curved tip, Radiofrequency needle(s) Gauge: 22G Length: 10cm Numbing solution: 6 cc solution made a 5 cc of 0.2% ropivacaine, 1 cc of Decadron 10 mg/cc.  1.5 cc injected at each level above.  The unused portion of the solution was discarded in the proper designated containers.  Once the entire procedure was completed, the treated area was cleaned, making sure to leave some of the prepping solution back to take advantage of its long term bactericidal properties.  Intra-operative Compliance: Compliant  Vitals:   05/19/18 0920 05/19/18 0929 05/19/18 0940  05/19/18 0949  BP: (!) 146/96 (!) 138/93 (!) 146/94 (!) 140/99  Pulse: 72 70 72 69  Resp: 18 19 (!) 24 20  Temp:  99 F (37.2 C)    TempSrc:      SpO2: 99% 98% 99% 99%  Weight:      Height:        Start Time: 0840 hrs. End Time: 0917 hrs.  Imaging Guidance (Spinal):          Type of Imaging Technique: Fluoroscopy Guidance (Spinal) Indication(s): Assistance in needle guidance and placement for procedures requiring needle placement in or near specific anatomical locations not easily accessible without such assistance. Exposure Time: Please see nurses notes. Contrast: None used. Fluoroscopic Guidance: I was personally present during the use of fluoroscopy. "Tunnel Vision Technique" used to obtain the best possible view of the target area. Parallax error corrected before commencing the procedure. "Direction-depth-direction" technique used to introduce the needle under continuous pulsed fluoroscopy. Once target was reached, antero-posterior, oblique, and lateral fluoroscopic projection used confirm needle placement in all planes. Images permanently stored in EMR. Interpretation: No contrast injected. I personally interpreted the imaging intraoperatively. Adequate needle placement confirmed in multiple planes. Permanent images saved into the patient's record.  Antibiotic Prophylaxis:   Anti-infectives (From admission, onward)   None     Indication(s): None identified  Post-operative Assessment:  Post-procedure Vital Signs:  Pulse/HCG Rate: 6969 Temp: 99 F (37.2 C) Resp: 20 BP: (!) 140/99 SpO2: 99 %  EBL: None  Complications: No immediate post-treatment complications observed by team, or reported by patient.  Note: The patient tolerated the entire procedure well. A repeat set of vitals were taken after the procedure and the patient was kept under observation following institutional policy, for this type of procedure. Post-procedural neurological assessment was performed, showing  return to baseline, prior to discharge. The patient was provided with post-procedure discharge instructions, including a section on how to identify potential problems. Should any problems arise concerning this procedure, the patient was given instructions to immediately contact us, at any time, without hesitation. In any case, we plan to contact  the patient by telephone for a follow-up status report regarding this interventional procedure.  Comments:  No additional relevant information. 5 out of 5 strength bilateral upper extremity: Shoulder abduction, elbow flexion, elbow extension, thumb extension.  Plan of Care    Imaging Orders     DG C-Arm 1-60 Min-No Report  Procedure Orders     Radiofrequency,Cervical  Medications ordered for procedure: Meds ordered this encounter  Medications  . lactated ringers infusion 1,000 mL  . fentaNYL (SUBLIMAZE) injection 25-100 mcg    Make sure Narcan is available in the pyxis when using this medication. In the event of respiratory depression (RR< 8/min): Titrate NARCAN (naloxone) in increments of 0.1 to 0.2 mg IV at 2-3 minute intervals, until desired degree of reversal.  . ropivacaine (PF) 2 mg/mL (0.2%) (NAROPIN) injection 10 mL  . lidocaine (XYLOCAINE) 2 % (with pres) injection 400 mg  . dexamethasone (DECADRON) injection 10 mg  . oxyCODONE (OXY IR/ROXICODONE) 5 MG immediate release tablet    Sig: Take 1.5 tablets (7.5 mg total) by mouth 2 (two) times daily as needed for severe pain.    Dispense:  90 tablet    Refill:  0    Do not place this medication, or any other prescription from our practice, on "Automatic Refill". Patient may have prescription filled one day early if pharmacy is closed on scheduled refill date.  . SUMAtriptan (IMITREX) 100 MG tablet    Sig: Take 1 tablet (100 mg total) by mouth every 2 (two) hours as needed for migraine.    Dispense:  14 tablet    Refill:  1   Medications administered: We administered lactated ringers,  fentaNYL, ropivacaine (PF) 2 mg/mL (0.2%), lidocaine, and dexamethasone.  See the medical record for exact dosing, route, and time of administration.  New Prescriptions   No medications on file   Disposition: Discharge home  Discharge Date & Time: 05/19/2018; 1000 hrs.   Physician-requested Follow-up: Return in about 3 weeks (around 06/09/2018) for Contra-lateral RFA.  Future Appointments  Date Time Provider [place]  06/13/2018  9:30 AM Vanga, Tally Due, MD [facility] None   Primary Care Physician: McLean-Scocuzza, Nino Glow, MD Location: [facility] Note by: Gillis Santa, MD Date: 05/19/2018; Time: 9:57 AM  Disclaimer:  Medicine is not an exact science. The only guarantee in medicine is that nothing is guaranteed. It is important to note that the decision to proceed with this intervention was based on the information collected from the patient. The Data and conclusions were drawn from the patient's questionnaire, the interview, and the physical examination. Because the information was provided in large part by the patient, it cannot be guaranteed that it has not been purposely or unconsciously manipulated. Every effort has been made to obtain as much relevant data as possible for this evaluation. It is important to note that the conclusions that lead to this procedure are derived in large part from the available data. Always take into account that the treatment will also be dependent on availability of resources and existing treatment guidelines, considered by other Pain Management Practitioners as being common knowledge and practice, at the time of the intervention. For Medico-Legal purposes, it is also important to point out that variation in procedural techniques and pharmacological choices are the acceptable norm. The indications, contraindications, technique, and results of the above procedure should only be interpreted and judged by a Board-Certified  Interventional Pain Specialist with extensive familiarity and expertise in the same exact procedure  and technique.

## 2018-05-19 NOTE — Progress Notes (Signed)
Safety precautions to be maintained throughout the outpatient stay will include: orient to surroundings, keep bed in low position, maintain call bell within reach at all times, provide assistance with transfer out of bed and ambulation.  

## 2018-05-20 ENCOUNTER — Telehealth: Payer: Self-pay | Admitting: *Deleted

## 2018-05-20 NOTE — Telephone Encounter (Signed)
Spoke with patient re; procedure on yesterday, a little sore which he expected, he is using his ice and has no c/o or concerns.

## 2018-06-05 ENCOUNTER — Telehealth: Payer: Self-pay | Admitting: Student in an Organized Health Care Education/Training Program

## 2018-06-05 NOTE — Telephone Encounter (Signed)
Patient called to cancel Monday's RFA and schedule a follow up with Dr. Holley Raring. I put him in for open spot on Monday.

## 2018-06-05 NOTE — Telephone Encounter (Signed)
Patient's Mother called stating she believes her son Johnny Villa, did not inform Dr. Holley Raring that he is a recovering alcholic and drug addict. When he got the script for the Oxycodone he abused it to the point he ended up in detox and rehab. Patient is leaving rehab against advice and going home.  Patient took half the amount of meds given to him in a very few days.  [phone] Mrs. Johnny Villa  Patients Mother WOULD LIKE TO KEEP THIS CALL FROM HER SON AS SHE IS TRYING TO HELP HIM AND HE WILL NOT LET HER IF HE KNOWS SHE CALLED HERE.

## 2018-06-06 ENCOUNTER — Emergency Department
Admission: EM | Admit: 2018-06-06 | Discharge: 2018-06-06 | Disposition: A | Discharge status: Home / Self Care | Payer: Medicare PPO | Attending: Emergency Medicine | Admitting: Emergency Medicine

## 2018-06-06 ENCOUNTER — Other Ambulatory Visit: Payer: Self-pay

## 2018-06-06 DIAGNOSIS — Z87891 Personal history of nicotine dependence: Secondary | ICD-10-CM | POA: Diagnosis not present

## 2018-06-06 DIAGNOSIS — Y999 Unspecified external cause status: Secondary | ICD-10-CM | POA: Diagnosis not present

## 2018-06-06 DIAGNOSIS — I10 Essential (primary) hypertension: Secondary | ICD-10-CM | POA: Insufficient documentation

## 2018-06-06 DIAGNOSIS — W260XXA Contact with knife, initial encounter: Secondary | ICD-10-CM | POA: Diagnosis not present

## 2018-06-06 DIAGNOSIS — Y929 Unspecified place or not applicable: Secondary | ICD-10-CM | POA: Insufficient documentation

## 2018-06-06 DIAGNOSIS — Z79899 Other long term (current) drug therapy: Secondary | ICD-10-CM | POA: Diagnosis not present

## 2018-06-06 DIAGNOSIS — S61213A Laceration without foreign body of left middle finger without damage to nail, initial encounter: Secondary | ICD-10-CM | POA: Insufficient documentation

## 2018-06-06 DIAGNOSIS — S6992XA Unspecified injury of left wrist, hand and finger(s), initial encounter: Secondary | ICD-10-CM | POA: Diagnosis present

## 2018-06-06 DIAGNOSIS — Y93G1 Activity, food preparation and clean up: Secondary | ICD-10-CM | POA: Diagnosis not present

## 2018-06-06 MED ORDER — LIDOCAINE HCL (PF) 1 % IJ SOLN
INTRAMUSCULAR | Status: AC
  Filled 2018-06-06: qty 5

## 2018-06-06 MED ORDER — LIDOCAINE HCL 1 % IJ SOLN: 5.0000 mL | Freq: Once | INTRAMUSCULAR | Status: DC

## 2018-06-06 MED ORDER — MELOXICAM 15 MG PO TABS: 15.0000 mg | ORAL_TABLET | Freq: Every day | ORAL | 1 refills | Status: AC

## 2018-06-06 MED ORDER — CEPHALEXIN 500 MG PO CAPS: 500.0000 mg | ORAL_CAPSULE | Freq: Three times a day (TID) | ORAL | 0 refills | Status: AC

## 2018-06-06 NOTE — ED Provider Notes (Signed)
[facility] Emergency Department Provider Note  ____________________________________________  Time seen: Approximately 4:43 PM  I have reviewed the triage vital signs and the nursing notes.   HISTORY  Chief Complaint Laceration    HPI Johnny Villa is a 42 y.o. male presents to the emergency department with a 1 cm left middle finger laceration sustained accidentally with a knife.  Patient reports that he was meal prepping for the week when incident occurred.  No numbness or tingling of the left hand.  No alleviating measures have been attempted.   Past Medical History:  Diagnosis Date  . Bipolar disorder [facility])    psychiatrist in [place]  . Blood in stool   . Depression   . Drug abuse in remission    sober for 6 years  . Fatty liver 01/2018  . H/O alcohol abuse   . Headache    migraines  . Hypertension   . Low testosterone in male     Patient Active Problem List   Diagnosis Date Noted  . Cervical spondylosis without myelopathy (C4,5,6,7; L>R) 01/08/2018  . Cervical facet joint syndrome 01/08/2018  . DDD (degenerative disc disease), cervical 01/08/2018  . Epigastric pain 11/28/2017  . Anal fissure 11/28/2017  . Blood in stool 11/28/2017  . Hemorrhoids 11/28/2017  . Cervicalgia 11/28/2017  . Chronic right shoulder pain 11/28/2017  . Back pain 10/29/2017  . Right hip pain 10/29/2017  . Knee pain, right 10/29/2017  . Hypogonadism in male 10/29/2017  . Low testosterone 10/29/2017  . Erectile dysfunction 10/29/2017  . Bipolar 1 disorder ([place]) 10/29/2017    Past Surgical History:  Procedure Laterality Date  . APPENDECTOMY     2006  . COLONOSCOPY WITH PROPOFOL N/A 12/30/2017   Procedure: COLONOSCOPY WITH PROPOFOL;  Surgeon: Lin Landsman, MD;  Location: [facility] ENDOSCOPY;  Service: Gastroenterology;  Laterality: N/A;  . ESOPHAGOGASTRODUODENOSCOPY (EGD) WITH PROPOFOL N/A 12/30/2017   Procedure: ESOPHAGOGASTRODUODENOSCOPY (EGD) WITH PROPOFOL;   Surgeon: Lin Landsman, MD;  Location: [facility] ENDOSCOPY;  Service: Gastroenterology;  Laterality: N/A;    Prior to Admission medications   Medication Sig Start Date End Date Taking? Authorizing Provider  acetaminophen (TYLENOL) 500 MG tablet Take 500 mg by mouth every 6 (six) hours as needed.    [provider]  albuterol (PROVENTIL HFA) 108 (90 Base) MCG/ACT inhaler Inhale into the lungs. 08/05/17 08/05/18  [provider]  aspirin-acetaminophen-caffeine (EXCEDRIN MIGRAINE) [phone] MG tablet Take by mouth every 6 (six) hours as needed for headache.    [provider]  buPROPion (WELLBUTRIN XL) 300 MG 24 hr tablet Take 300 mg by mouth daily.  09/16/17   [provider]  busPIRone (BUSPAR) 30 MG tablet 2 (two) times daily.  09/05/17   [provider]  cephALEXin (KEFLEX) 500 MG capsule Take 1 capsule (500 mg total) by mouth 3 (three) times daily for 10 days. 06/06/18 06/16/18  Lannie Fields, PA-C  cholecalciferol (VITAMIN D) 1000 units tablet Take 5,000 Units by mouth daily.    [provider]  clomiPHENE (CLOMID) 50 MG tablet Take 1 tablet (50 mg total) by mouth every other day. 11/26/17   McLean-Scocuzza, Nino Glow, MD  clonazePAM Bobbye [place]) 0.5 MG tablet  12/11/17   [provider]  dicyclomine (BENTYL) 10 MG capsule Take 2 capsules (20 mg total) by mouth 4 (four) times daily -  before meals and at bedtime. 05/02/18 06/01/18  Lin Landsman, MD  DULoxetine (CYMBALTA) 30 MG capsule Take 30 mg  by mouth daily.    [provider]  fluticasone (FLONASE) 50 MCG/ACT nasal spray PLACE 2 SPRAYS INTO EACH NOSTRIL QD 03/29/16   [provider]  lamoTRIgine (LAMICTAL) 200 MG tablet Take by mouth daily.  09/05/17   [provider]  lisdexamfetamine (VYVANSE) 20 MG capsule Take 20 mg by mouth daily.    [provider]  lithium carbonate 300 MG capsule Take 300 mg by mouth daily.  09/26/17   [provider]  Melatonin 5 MG CAPS  07/22/17   [provider]  meloxicam (MOBIC) 15 MG tablet Take 1 tablet (15 mg total) by mouth daily for 7 days. 06/06/18 06/13/18  Lannie Fields, PA-C  Multiple Vitamins-Minerals (MULTIVITAMIN MEN) TABS Take 1 tablet by mouth daily. 07/22/17   [provider]  oxyCODONE (OXY IR/ROXICODONE) 5 MG immediate release tablet Take 1.5 tablets (7.5 mg total) by mouth 2 (two) times daily as needed for severe pain. 05/19/18   Gillis Santa, MD  SALINE MIST 0.65 % nasal spray Place 1 application into both nostrils as directed. 01/07/18   [provider]  sildenafil (REVATIO) 20 MG tablet Take 40 mg by mouth.    [provider]  SUMAtriptan (IMITREX) 100 MG tablet Take 1 tablet (100 mg total) by mouth every 2 (two) hours as needed for migraine. 05/19/18   Gillis Santa, MD  traZODone (DESYREL) 100 MG tablet Take 200 mg by mouth at bedtime.    [provider]  Vitamins A & D 5000-400 units CAPS  01/07/18   [provider]    Allergies Patient has no known allergies.  Family History  Problem Relation Age of Onset  . Depression Mother   . Alcohol abuse Father   . Cancer Father        prostate dx'ed 67  . COPD Father   . Hyperlipidemia Father   . Hypertension Father     Social History Social History   Tobacco Use  . Smoking status: Former Smoker    Types: Cigarettes    Last attempt to quit: 2016    Years since quitting: 3.6  . Smokeless tobacco: Never Used  Substance Use Topics  . Alcohol use: No    Frequency: Never    Comment: former quit 03/14/2012  . Drug use: No    Comment: former quit cocaine 03/14/2012      Review of Systems  Constitutional: No fever/chills Eyes: No visual changes. No discharge ENT: No upper respiratory complaints. Cardiovascular: no chest pain. Respiratory: no cough. No SOB. Gastrointestinal: No abdominal pain.  No nausea, no vomiting.  No diarrhea.  No constipation. Musculoskeletal:  Negative for musculoskeletal pain. Skin: Patient has left middle finger laceration.  Neurological: Negative for headaches, focal weakness or numbness.   ____________________________________________   PHYSICAL EXAM:  VITAL SIGNS: ED Triage Vitals  Enc Vitals Group     BP 06/06/18 1600 (!) 138/92     Pulse Rate 06/06/18 1600 92     Resp 06/06/18 1600 16     Temp 06/06/18 1600 99 F (37.2 C)     Temp Source 06/06/18 1600 Oral     SpO2 06/06/18 1600 97 %     Weight 06/06/18 1556 214 lb 15.2 oz (97.5 kg)     Height 06/06/18 1556 5\' 11"  (1.803 m)     Head Circumference --      Peak Flow --      Pain Score 06/06/18 1556 6  Pain Loc --      Pain Edu? --      Excl. in [place]? --      Constitutional: Alert and oriented. Well appearing and in no acute distress. Eyes: Conjunctivae are normal. PERRL. EOMI. Head: Atraumatic. Cardiovascular: Normal rate, regular rhythm. Normal S1 and S2.  Good peripheral circulation. Respiratory: Normal respiratory effort without tachypnea or retractions. Lungs CTAB. Good air entry to the bases with no decreased or absent breath sounds. Musculoskeletal: Full range of motion to all extremities. No gross deformities appreciated. Neurologic:  Normal speech and language. No gross focal neurologic deficits are appreciated.  Skin: Patient has 1 cm superficial laceration deep to dermis along the volar aspect of the finger.  Psychiatric: Mood and affect are normal. Speech and behavior are normal. Patient exhibits appropriate insight and judgement.   ____________________________________________   LABS (all labs ordered are listed, but only abnormal results are displayed)  Labs Reviewed - No data to display ____________________________________________  EKG   ____________________________________________  RADIOLOGY   No results found.  ____________________________________________    PROCEDURES  Procedure(s) performed:     Procedures  LACERATION REPAIR Performed by: Lannie Fields Authorized by: Lannie Fields Consent: Verbal consent obtained. Risks and benefits: risks, benefits and alternatives were discussed Consent given by: patient Patient identity confirmed: provided demographic data Prepped and Draped in normal sterile fashion Wound explored  Laceration Location: Left third finger   Laceration Length: 1 cm  No Foreign Bodies seen or palpated  Anesthesia: local infiltration  Local anesthetic: lidocaine 1% without epinephrine  Anesthetic total: 2 ml  Irrigation method: syringe Amount of cleaning: standard  Skin closure: 4-0 Ethilon   Number of sutures: 4  Technique: Simple interrupted   Patient tolerance: Patient tolerated the procedure well with no immediate complications.    Medications - No data to display   ____________________________________________   INITIAL IMPRESSION / ASSESSMENT AND PLAN / ED COURSE  Pertinent labs & imaging results that were available during my care of the patient were reviewed by me and considered in my medical decision making (see chart for details).  Review of the [place] CSRS was performed in accordance of the [place] prior to dispensing any controlled drugs.      Assessment and plan Finger laceration Patient presents to the emergency department with a 1 cm laceration of left third digit that sustained accidentally with a knife.  Patient has up-to-date tetanus.  Patient was advised to have sutures removed by primary care in 1 week.  He was discharged with Keflex.  All patient questions were answered.     ____________________________________________  FINAL CLINICAL IMPRESSION(S) / ED DIAGNOSES  Final diagnoses:  Laceration of left middle finger without foreign body without damage to nail, initial encounter      NEW MEDICATIONS STARTED DURING THIS VISIT:  ED Discharge Orders         Ordered    cephALEXin (KEFLEX) 500 MG capsule  3  times daily     06/06/18 1645    meloxicam (MOBIC) 15 MG tablet  Daily     06/06/18 1646              This chart was dictated using voice recognition software/Dragon. Despite best efforts to proofread, errors can occur which can change the meaning. Any change was purely unintentional.    Karren Cobble 06/06/18 2139    Nance Pear, MD 06/06/18 2152

## 2018-06-06 NOTE — ED Triage Notes (Signed)
Pt states he cut his left 3rd finger with a knife while cooking today. Bleeding is controlled

## 2018-06-06 NOTE — ED Notes (Signed)
See triage note  Presents with laceration to left 3rd digit   States knife slipped

## 2018-06-09 ENCOUNTER — Ambulatory Visit
Payer: Medicare PPO | Attending: Student in an Organized Health Care Education/Training Program | Admitting: Student in an Organized Health Care Education/Training Program

## 2018-06-13 ENCOUNTER — Ambulatory Visit: Payer: Medicare PPO | Admitting: Gastroenterology

## 2018-06-16 ENCOUNTER — Ambulatory Visit: Payer: Medicare PPO | Admitting: Student in an Organized Health Care Education/Training Program

## 2018-06-18 ENCOUNTER — Telehealth: Payer: Self-pay | Admitting: Student in an Organized Health Care Education/Training Program

## 2018-06-18 NOTE — Telephone Encounter (Signed)
Patient lvmail stating he wants to schedule a follow up appt. Is this ok to set up?

## 2018-06-19 ENCOUNTER — Ambulatory Visit: Payer: Medicare PPO | Admitting: Internal Medicine

## 2018-06-19 ENCOUNTER — Encounter: Payer: Self-pay | Admitting: Internal Medicine

## 2018-06-19 VITALS — BP 132/78 | HR 84 | Temp 98.8°F | Ht 71.0 in | Wt 225.6 lb

## 2018-06-19 DIAGNOSIS — R519 Headache, unspecified: Secondary | ICD-10-CM

## 2018-06-19 DIAGNOSIS — E785 Hyperlipidemia, unspecified: Secondary | ICD-10-CM

## 2018-06-19 DIAGNOSIS — R251 Tremor, unspecified: Secondary | ICD-10-CM

## 2018-06-19 DIAGNOSIS — G43809 Other migraine, not intractable, without status migrainosus: Secondary | ICD-10-CM

## 2018-06-19 DIAGNOSIS — Z4802 Encounter for removal of sutures: Secondary | ICD-10-CM | POA: Diagnosis not present

## 2018-06-19 DIAGNOSIS — N529 Male erectile dysfunction, unspecified: Secondary | ICD-10-CM

## 2018-06-19 DIAGNOSIS — R51 Headache: Secondary | ICD-10-CM

## 2018-06-19 DIAGNOSIS — Z23 Encounter for immunization: Secondary | ICD-10-CM | POA: Diagnosis not present

## 2018-06-19 DIAGNOSIS — S069X0D Unspecified intracranial injury without loss of consciousness, subsequent encounter: Secondary | ICD-10-CM

## 2018-06-19 DIAGNOSIS — E291 Testicular hypofunction: Secondary | ICD-10-CM

## 2018-06-19 MED ORDER — SILDENAFIL CITRATE 20 MG PO TABS: 40.0000 mg | ORAL_TABLET | Freq: Every day | ORAL | 2 refills | Status: DC

## 2018-06-19 MED ORDER — SUMATRIPTAN SUCCINATE 100 MG PO TABS
100.0000 mg | ORAL_TABLET | ORAL | 2 refills | Status: DC | PRN
Start: 2018-06-19 — End: 2018-08-01

## 2018-06-19 NOTE — Patient Instructions (Addendum)
F/u in 1-2 months  Tremor A tremor is trembling or shaking that you cannot control. Most tremors affect the hands or arms. Tremors can also affect the head, vocal cords, face, and other parts of the body. There are many types of tremors. Common types include:  Essential tremor. These usually occur in people over the age of 18. It may run in families and can happen in otherwise healthy people.  Resting tremor. These occur when the muscles are at rest, such as when your hands are resting in your lap. People with Parkinson disease often have resting tremors.  Postural tremor. These occur when you try to hold a pose, such as keeping your hands outstretched.  Kinetic tremor. These occur during purposeful movement, such as trying to touch a finger to your nose.  Task-specific tremor. These may occur when you perform tasks such as handwriting, speaking, or standing.  Psychogenic tremor. These dramatically lessen or disappear when you are distracted. They can happen in people of all ages.  Some types of tremors have no known cause. Tremors can also be a symptom of nervous system problems (neurological disorders) that may occur with aging. Some tremors go away with treatment while others do not. Follow these instructions at home: Watch your tremor for any changes. The following actions may help to lessen any discomfort you are feeling:  Take medicines only as directed by your health care provider.  Limit alcohol intake to no more than 1 drink per day for nonpregnant women and 2 drinks per day for men. One drink equals 12 oz of beer, 5 oz of wine, or 1 oz of hard liquor.  Do not use any tobacco products, including cigarettes, chewing tobacco, or electronic cigarettes. If you need help quitting, ask your health care provider.  Avoid extreme heat or cold.  Limit the amount of caffeine you consumeas directed by your health care provider.  Try to get 8 hours of sleep each night.  Find ways to  manage your stress, such as meditation or yoga.  Keep all follow-up visits as directed by your health care provider. This is important.  Contact a health care provider if:  You start having a tremor after starting a new medicine.  You have tremor with other symptoms such as: ? Numbness. ? Tingling. ? Pain. ? Weakness.  Your tremor gets worse.  Your tremor interferes with your day-to-day life. This information is not intended to replace advice given to you by your health care provider. Make sure you discuss any questions you have with your health care provider. Document Released: 09/07/2002 Document Revised: 05/20/2016 Document Reviewed: 03/15/2014 Elsevier Interactive Patient Education  [employer].

## 2018-06-19 NOTE — Progress Notes (Signed)
Pre visit review using our clinic review tool, if applicable. No additional management support is needed unless otherwise documented below in the visit note. 

## 2018-06-20 ENCOUNTER — Encounter: Payer: Self-pay | Admitting: Internal Medicine

## 2018-06-20 ENCOUNTER — Other Ambulatory Visit: Payer: Medicare PPO

## 2018-06-20 DIAGNOSIS — E785 Hyperlipidemia, unspecified: Secondary | ICD-10-CM | POA: Insufficient documentation

## 2018-06-20 NOTE — Progress Notes (Signed)
Chief Complaint  Patient presents with  . Suture / Staple Removal   F/u 1. Suture removal needed right hand ring finger s/p trauma  2. Neck pain chronic s/p 2 steroid shots and nerve ablation with Dr. Holley Raring for C4-7 problems  3. C/o migraines would like refill on imitrex 100 mg prn associated with n/v and h/o TBI. He also c/o new tremor which is associated with increase in cymbalta dose to 60 mg qd  4. Bipolar currently seeing psych Dr. Orland Penman [phone] fax [phone] would like labs checked will fax to her   Review of Systems  Constitutional: Negative for weight loss.  HENT: Negative for hearing loss.   Eyes: Negative for blurred vision.  Respiratory: Negative for shortness of breath.   Cardiovascular: Negative for chest pain.  Gastrointestinal: Positive for nausea and vomiting. Negative for blood in stool.  Musculoskeletal: Positive for neck pain.  Skin: Negative for rash.  Neurological: Positive for tremors and headaches.  Psychiatric/Behavioral:       +bipolar    Past Medical History:  Diagnosis Date  . Bipolar disorder [facility])    psychiatrist in [place]   . Blood in stool   . Depression   . Drug abuse in remission    sober for 6 years  . Fatty liver 01/2018  . H/O alcohol abuse   . Headache    migraines  . Hypertension   . Low testosterone in male    Past Surgical History:  Procedure Laterality Date  . APPENDECTOMY     2006  . COLONOSCOPY WITH PROPOFOL N/A 12/30/2017   Procedure: COLONOSCOPY WITH PROPOFOL;  Surgeon: Lin Landsman, MD;  Location: [facility] ENDOSCOPY;  Service: Gastroenterology;  Laterality: N/A;  . ESOPHAGOGASTRODUODENOSCOPY (EGD) WITH PROPOFOL N/A 12/30/2017   Procedure: ESOPHAGOGASTRODUODENOSCOPY (EGD) WITH PROPOFOL;  Surgeon: Lin Landsman, MD;  Location: [facility] ENDOSCOPY;  Service: Gastroenterology;  Laterality: N/A;   Family History  Problem Relation Age of Onset  . Depression Mother   . Alcohol abuse Father   . Cancer Father         prostate dx'ed 64  . COPD Father   . Hyperlipidemia Father   . Hypertension Father    Social History   Socioeconomic History  . Marital status: Single    Spouse name: Not on file  . Number of children: Not on file  . Years of education: Not on file  . Highest education level: Not on file  Occupational History  . Occupation: disabled  Social Needs  . Financial resource strain: Not hard at all  . Food insecurity:    Worry: Patient refused    Inability: Patient refused  . Transportation needs:    Medical: Patient refused    Non-medical: Patient refused  Tobacco Use  . Smoking status: Former Smoker    Types: Cigarettes    Last attempt to quit: 2016    Years since quitting: 3.7  . Smokeless tobacco: Never Used  Substance and Sexual Activity  . Alcohol use: No    Frequency: Never    Comment: former quit 03/14/2012  . Drug use: No    Comment: former quit cocaine 03/14/2012   . Sexual activity: Yes  Lifestyle  . Physical activity:    Days per week: Patient refused    Minutes per session: Patient refused  . Stress: Not on file  Relationships  . Social connections:    Talks on phone: Patient refused    Gets together: Patient refused  Attends religious service: Patient refused    Active member of club or organization: Patient refused    Attends meetings of clubs or organizations: Patient refused    Relationship status: Patient refused  . Intimate partner violence:    Fear of current or ex partner: Patient refused    Emotionally abused: Patient refused    Physically abused: Patient refused    Forced sexual activity: Patient refused  Other Topics Concern  . Not on file  Social History Narrative   No kids   MBA   Disabled    Current Meds  Medication Sig  . acetaminophen (TYLENOL) 500 MG tablet Take 500 mg by mouth every 6 (six) hours as needed.  Marland Kitchen albuterol (PROVENTIL HFA) 108 (90 Base) MCG/ACT inhaler Inhale into the lungs.  Marland Kitchen aspirin-acetaminophen-caffeine  (EXCEDRIN MIGRAINE) 250-250-65 MG tablet Take by mouth every 6 (six) hours as needed for headache.  Marland Kitchen buPROPion (WELLBUTRIN XL) 300 MG 24 hr tablet Take 300 mg by mouth daily.   . busPIRone (BUSPAR) 30 MG tablet 2 (two) times daily.   . Cholecalciferol (VITAMIN D PO) Take 5,000 Units by mouth daily.   . clomiPHENE (CLOMID) 50 MG tablet Take 1 tablet (50 mg total) by mouth every other day.  . clonazePAM (KLONOPIN) 0.5 MG tablet   . DULoxetine (CYMBALTA) 30 MG capsule Take 60 mg by mouth daily.   . fluticasone (FLONASE) 50 MCG/ACT nasal spray PLACE 2 SPRAYS INTO EACH NOSTRIL QD  . lamoTRIgine (LAMICTAL) 200 MG tablet Take by mouth daily.   Marland Kitchen lithium carbonate 300 MG capsule Take 300 mg by mouth daily. 900 mg  in am pm 1200 mg Dr. Jacklyn Shell [place]  . Multiple Vitamins-Minerals (MULTIVITAMIN MEN) TABS Take 1 tablet by mouth daily.  Marland Kitchen SALINE MIST 0.65 % nasal spray Place 1 application into both nostrils as directed.  . sildenafil (REVATIO) 20 MG tablet Take 2 tablets (40 mg total) by mouth daily. Prn  . SUMAtriptan (IMITREX) 100 MG tablet Take 1 tablet (100 mg total) by mouth every 2 (two) hours as needed for migraine. Max dose 200 mg in 1 day  . traZODone (DESYREL) 100 MG tablet Take 200 mg by mouth at bedtime.  . Vitamins A & D 5000-400 units CAPS   . [DISCONTINUED] lisdexamfetamine (VYVANSE) 20 MG capsule Take 20 mg by mouth daily.  . [DISCONTINUED] Melatonin 5 MG CAPS   . [DISCONTINUED] oxyCODONE (OXY IR/ROXICODONE) 5 MG immediate release tablet Take 1.5 tablets (7.5 mg total) by mouth 2 (two) times daily as needed for severe pain.  . [DISCONTINUED] sildenafil (REVATIO) 20 MG tablet Take 40 mg by mouth.  . [DISCONTINUED] SUMAtriptan (IMITREX) 100 MG tablet Take 1 tablet (100 mg total) by mouth every 2 (two) hours as needed for migraine.   No Known Allergies Recent Results (from the past 2160 hour(s))  Compliance Drug Analysis, Ur     Status: None   Collection Time: 04/09/18  3:12 PM  Result  Value Ref Range   Summary FINAL     Comment: ==================================================================== [facility] COMP DRUG ANALYSIS,UR ==================================================================== Test                             Result       Flag       Units Drug Present and Declared for Prescription Verification   7-aminoclonazepam              114  EXPECTED   ng/mg creat    7-aminoclonazepam is an expected metabolite of clonazepam. Source    of clonazepam is a scheduled prescription medication.   Oxycodone                      149          EXPECTED   ng/mg creat   Oxymorphone                    62           EXPECTED   ng/mg creat   Noroxycodone                   695          EXPECTED   ng/mg creat   Noroxymorphone                 84           EXPECTED   ng/mg creat    Sources of oxycodone are scheduled prescription medications.    Oxymorphone, noroxycodone, and noroxymorphone are expected    metabolites of oxycodone. Oxymorphone is also available as a    scheduled prescription medicatio n.   Lamotrigine                    PRESENT      EXPECTED   Duloxetine                     PRESENT      EXPECTED   Trazodone                      PRESENT      EXPECTED   1,3 chlorophenyl piperazine    PRESENT      EXPECTED    1,3-chlorophenyl piperazine is an expected metabolite of    trazodone. Drug Present not Declared for Prescription Verification   Amphetamine                    143          UNEXPECTED ng/mg creat    Amphetamine is available as a schedule II prescription drug. Drug Absent but Declared for Prescription Verification   Bupropion                      Not Detected UNEXPECTED   Acetaminophen                  Not Detected UNEXPECTED    Acetaminophen, as indicated in the declared medication list, is    not always detected even when used as directed.   Salicylate                     Not Detected UNEXPECTED    Aspirin, as indicated in the declared medication  list, is not    always detected even when used as directed. ======================================== ============================ Test                      Result    Flag   Units      Ref Range   Creatinine              152              mg/dL      >=20 ==================================================================== Declared Medications:  The flagging and interpretation on  this report are based on the  following declared medications.  Unexpected results may arise from  inaccuracies in the declared medications.  **Note: The testing scope of this panel includes these medications:  Bupropion  Clonazepam  Duloxetine  Lamotrigine  Oxycodone  Oxycodone (Oxy-IR)  Trazodone  **Note: The testing scope of this panel does not include small to  moderate amounts of these reported medications:  Acetaminophen  Acetaminophen (Excedrin)  Aspirin (Excedrin)  **Note: The testing scope of this panel does not include following  reported medications:  Albuterol  Buspirone  Caffeine (Excedrin)  Clomiphene  Fluticasone  Lithium  Melatonin  Multivitamin (MVI)  Saline  Silde nafil  Sumatriptan  Vitamin A  Vitamin D ==================================================================== For clinical consultation, please call [phone]. ====================================================================   C difficile quick scan w PCR reflex     Status: None   Collection Time: 05/02/18 12:20 PM  Result Value Ref Range   C Diff antigen NEGATIVE NEGATIVE   C Diff toxin NEGATIVE NEGATIVE   C Diff interpretation No C. difficile detected.     Comment: Performed at [facility], [address] [place], [place] [address]  Gastrointestinal Panel by PCR , Stool     Status: None   Collection Time: 05/02/18 12:20 PM  Result Value Ref Range   Campylobacter species NOT DETECTED NOT DETECTED   Plesimonas shigelloides NOT DETECTED NOT DETECTED   Salmonella species NOT DETECTED NOT  DETECTED   Yersinia enterocolitica NOT DETECTED NOT DETECTED   Vibrio species NOT DETECTED NOT DETECTED   Vibrio cholerae NOT DETECTED NOT DETECTED   Enteroaggregative E coli (EAEC) NOT DETECTED NOT DETECTED   Enteropathogenic E coli (EPEC) NOT DETECTED NOT DETECTED   Enterotoxigenic E coli (ETEC) NOT DETECTED NOT DETECTED   Shiga like toxin producing E coli (STEC) NOT DETECTED NOT DETECTED   Shigella/Enteroinvasive E coli (EIEC) NOT DETECTED NOT DETECTED   Cryptosporidium NOT DETECTED NOT DETECTED   Cyclospora cayetanensis NOT DETECTED NOT DETECTED   Entamoeba histolytica NOT DETECTED NOT DETECTED   Giardia lamblia NOT DETECTED NOT DETECTED   Adenovirus F40/41 NOT DETECTED NOT DETECTED   Astrovirus NOT DETECTED NOT DETECTED   Norovirus GI/GII NOT DETECTED NOT DETECTED   Rotavirus A NOT DETECTED NOT DETECTED   Sapovirus (I, II, IV, and V) NOT DETECTED NOT DETECTED    Comment: Performed at [facility], [address] [place], [place] [address]   Objective  Body mass index is 31.46 kg/m. Wt Readings from Last 3 Encounters:  06/19/18 225 lb 9.6 oz (102.3 kg)  06/06/18 214 lb 15.2 oz (97.5 kg)  05/19/18 215 lb (97.5 kg)   Temp Readings from Last 3 Encounters:  06/19/18 98.8 F (37.1 C) (Oral)  06/06/18 99 F (37.2 C) (Oral)  05/19/18 99 F (37.2 C)   BP Readings from Last 3 Encounters:  06/19/18 132/78  06/06/18 (!) 138/92  05/19/18 (!) 140/99   Pulse Readings from Last 3 Encounters:  06/19/18 84  06/06/18 92  05/19/18 69    Physical Exam  Constitutional: He is oriented to person, place, and time. Vital signs are normal. He appears well-developed and well-nourished. He is cooperative.  HENT:  Head: Normocephalic and atraumatic.  Mouth/Throat: Oropharynx is clear and moist and mucous membranes are normal.  Eyes: Pupils are equal, round, and reactive to light. Conjunctivae are normal.  Cardiovascular: Normal rate, regular rhythm and normal heart sounds.   Pulmonary/Chest: Effort normal and breath sounds normal.  Neurological: He is alert  and oriented to person, place, and time. He displays tremor. Gait normal.  Skin: Skin is warm, dry and intact.  Psychiatric: He has a normal mood and affect. His speech is normal and behavior is normal. Judgment and thought content normal. Cognition and memory are normal.  Nursing note and vitals reviewed.   Assessment   1. Suture removal right middle finger removed 4 sutures  2. Cervicalgia chronic  3. Migraines, new tremor, s/p TBI  4.bipolar  5. HM Plan  1. Cleaned with betadine and removed  2. F/u with Dr. Holley Raring pain clinic  3. Refilled imitrex  Do MRI  Medications could be causing tremor  4. F/u psych  5.  Flu shot given today  Tdap utd  Declines MMR lab check  HIV neg 05/24/17  Check labs upcoming   Provider: Dr. Olivia Mackie McLean-Scocuzza-Internal Medicine

## 2018-06-23 ENCOUNTER — Encounter: Payer: Self-pay | Admitting: Internal Medicine

## 2018-06-23 ENCOUNTER — Other Ambulatory Visit (INDEPENDENT_AMBULATORY_CARE_PROVIDER_SITE_OTHER): Payer: Medicare PPO

## 2018-06-23 DIAGNOSIS — E291 Testicular hypofunction: Secondary | ICD-10-CM

## 2018-06-23 DIAGNOSIS — R251 Tremor, unspecified: Secondary | ICD-10-CM | POA: Diagnosis not present

## 2018-06-23 NOTE — Addendum Note (Signed)
Addended by: Arby Barrette on: 06/23/2018 09:44 AM   Modules accepted: Orders

## 2018-06-24 ENCOUNTER — Other Ambulatory Visit: Payer: Self-pay

## 2018-06-24 ENCOUNTER — Ambulatory Visit
Payer: Medicare PPO | Attending: Student in an Organized Health Care Education/Training Program | Admitting: Student in an Organized Health Care Education/Training Program

## 2018-06-24 ENCOUNTER — Encounter: Payer: Self-pay | Admitting: Student in an Organized Health Care Education/Training Program

## 2018-06-24 VITALS — BP 133/89 | HR 70 | Temp 98.7°F | Resp 18 | Ht 71.0 in | Wt 210.0 lb

## 2018-06-24 DIAGNOSIS — M62830 Muscle spasm of back: Secondary | ICD-10-CM | POA: Diagnosis not present

## 2018-06-24 DIAGNOSIS — G894 Chronic pain syndrome: Secondary | ICD-10-CM

## 2018-06-24 DIAGNOSIS — F319 Bipolar disorder, unspecified: Secondary | ICD-10-CM | POA: Diagnosis not present

## 2018-06-24 DIAGNOSIS — M5442 Lumbago with sciatica, left side: Secondary | ICD-10-CM | POA: Diagnosis not present

## 2018-06-24 DIAGNOSIS — Z811 Family history of alcohol abuse and dependence: Secondary | ICD-10-CM | POA: Insufficient documentation

## 2018-06-24 DIAGNOSIS — I10 Essential (primary) hypertension: Secondary | ICD-10-CM | POA: Insufficient documentation

## 2018-06-24 DIAGNOSIS — M542 Cervicalgia: Secondary | ICD-10-CM | POA: Diagnosis not present

## 2018-06-24 DIAGNOSIS — Z809 Family history of malignant neoplasm, unspecified: Secondary | ICD-10-CM | POA: Diagnosis not present

## 2018-06-24 DIAGNOSIS — Z79899 Other long term (current) drug therapy: Secondary | ICD-10-CM | POA: Insufficient documentation

## 2018-06-24 DIAGNOSIS — G8929 Other chronic pain: Secondary | ICD-10-CM

## 2018-06-24 DIAGNOSIS — Z7982 Long term (current) use of aspirin: Secondary | ICD-10-CM | POA: Insufficient documentation

## 2018-06-24 DIAGNOSIS — F17210 Nicotine dependence, cigarettes, uncomplicated: Secondary | ICD-10-CM | POA: Insufficient documentation

## 2018-06-24 DIAGNOSIS — E291 Testicular hypofunction: Secondary | ICD-10-CM | POA: Insufficient documentation

## 2018-06-24 DIAGNOSIS — R251 Tremor, unspecified: Secondary | ICD-10-CM | POA: Diagnosis not present

## 2018-06-24 DIAGNOSIS — Z825 Family history of asthma and other chronic lower respiratory diseases: Secondary | ICD-10-CM | POA: Insufficient documentation

## 2018-06-24 DIAGNOSIS — Z818 Family history of other mental and behavioral disorders: Secondary | ICD-10-CM | POA: Diagnosis not present

## 2018-06-24 DIAGNOSIS — Z8249 Family history of ischemic heart disease and other diseases of the circulatory system: Secondary | ICD-10-CM | POA: Insufficient documentation

## 2018-06-24 DIAGNOSIS — N529 Male erectile dysfunction, unspecified: Secondary | ICD-10-CM | POA: Diagnosis not present

## 2018-06-24 DIAGNOSIS — M47812 Spondylosis without myelopathy or radiculopathy, cervical region: Secondary | ICD-10-CM

## 2018-06-24 DIAGNOSIS — K602 Anal fissure, unspecified: Secondary | ICD-10-CM | POA: Diagnosis not present

## 2018-06-24 DIAGNOSIS — Z9889 Other specified postprocedural states: Secondary | ICD-10-CM | POA: Diagnosis not present

## 2018-06-24 DIAGNOSIS — M5441 Lumbago with sciatica, right side: Secondary | ICD-10-CM

## 2018-06-24 MED ORDER — METHOCARBAMOL 750 MG PO TABS: 750.0000 mg | ORAL_TABLET | Freq: Three times a day (TID) | ORAL | 0 refills | Status: DC | PRN

## 2018-06-24 MED ORDER — NONFORMULARY OR COMPOUNDED ITEM: 2 refills | Status: DC

## 2018-06-24 NOTE — Progress Notes (Signed)
Patient's Name: Johnny Villa  MRN: [id number]  Referring Provider: Orland Mustard *  DOB: 12/24/1975  PCP: McLean-Scocuzza, Nino Glow, MD  DOS: 06/24/2018  Note by: Gillis Santa, MD  Service setting: Ambulatory outpatient  Specialty: Interventional Pain Management  Location: [facility] (AMB) Pain Management Facility    Patient type: Established   Primary Reason(s) for Visit: Encounter for post-procedure evaluation of chronic illness with mild to moderate exacerbation CC: Shoulder Pain; Neck Pain (right); and Back Pain  HPI  Johnny Villa is a 42 y.o. year old, male patient, who comes today for a post-procedure evaluation. He has Back pain; Right hip pain; Knee pain, right; Hypogonadism in male; Low testosterone; Erectile dysfunction; Bipolar 1 disorder ([place]); Epigastric pain; Anal fissure; Blood in stool; Hemorrhoids; Cervicalgia; Chronic right shoulder pain; Cervical spondylosis without myelopathy (C4,5,6,7); Cervical facet joint syndrome; DDD (degenerative disc disease), cervical; and Hyperlipidemia on their problem list. His primarily concern today is the Shoulder Pain; Neck Pain (right); and Back Pain  Pain Assessment: Location: Right Neck Radiating: radiates from neck down to fingertips, trembling noted Onset: More than a month ago Duration: Chronic pain Quality: Constant, Contraction(tremors) Severity: 3 /10 (subjective, self-reported pain score)  Note: Reported level is compatible with observation.                         When using our objective Pain Scale, levels between 6 and 10/10 are said to belong in an emergency room, as it progressively worsens from a 6/10, described as severely limiting, requiring emergency care not usually available at an outpatient pain management facility. At a 6/10 level, communication becomes difficult and requires great effort. Assistance to reach the emergency department may be required. Facial flushing and profuse sweating along with potentially dangerous  increases in heart rate and blood pressure will be evident. Effect on ADL: limits activities Timing: Constant Modifying factors: lidocaine cream BP: 133/89  HR: 70  Johnny Villa comes in today for post-procedure evaluation after the treatment done on 06/18/2018.  Further details on both, my assessment(s), as well as the proposed treatment plan, please see below.  Post-Procedure Assessment  06/18/2018 Procedure: Right C4, 5, 6, 7 RFA Pre-procedure pain score:  8/10 Post-procedure pain score: 2/10         Influential Factors: BMI: 29.29 kg/m Intra-procedural challenges: None observed.         Assessment challenges: None detected.              Reported side-effects: None.        Post-procedural adverse reactions or complications: None reported         Sedation: Please see nurses note. When no sedatives are used, the analgesic levels obtained are directly associated to the effectiveness of the local anesthetics. However, when sedation is provided, the level of analgesia obtained during the initial 1 hour following the intervention, is believed to be the result of a combination of factors. These factors may include, but are not limited to: 1. The effectiveness of the local anesthetics used. 2. The effects of the analgesic(s) and/or anxiolytic(s) used. 3. The degree of discomfort experienced by the patient at the time of the procedure. 4. The patients ability and reliability in recalling and recording the events. 5. The presence and influence of possible secondary gains and/or psychosocial factors. Reported result: Relief experienced during the 1st hour after the procedure: 100 % (Ultra-Short Term Relief)            Interpretative annotation:  Clinically appropriate result. Analgesia during this period is likely to be Local Anesthetic and/or IV Sedative (Analgesic/Anxiolytic) related.          Effects of local anesthetic: The analgesic effects attained during this period are directly associated  to the localized infiltration of local anesthetics and therefore cary significant diagnostic value as to the etiological location, or anatomical origin, of the pain. Expected duration of relief is directly dependent on the pharmacodynamics of the local anesthetic used. Long-acting (4-6 hours) anesthetics used.  Reported result: Relief during the next 4 to 6 hour after the procedure: 100 % (Short-Term Relief)            Interpretative annotation: Clinically appropriate result. Analgesia during this period is likely to be Local Anesthetic-related.          Long-term benefit: Defined as the period of time past the expected duration of local anesthetics (1 hour for short-acting and 4-6 hours for long-acting). With the possible exception of prolonged sympathetic blockade from the local anesthetics, benefits during this period are typically attributed to, or associated with, other factors such as analgesic sensory neuropraxia, antiinflammatory effects, or beneficial biochemical changes provided by agents other than the local anesthetics.  Reported result: Extended relief following procedure: 75 % (Long-Term Relief)            Interpretative annotation: Clinically possible results. Good relief. No permanent benefit expected. Inflammation plays a part in the etiology to the pain.          Current benefits: Defined as reported results that persistent at this point in time.   Analgesia: 50-75 %            Function: Johnny Villa reports improvement in function ROM: Johnny Villa reports improvement in ROM Interpretative annotation: Ongoing benefit. Therapeutic benefit observed. Adequate RF ablation.          Interpretation: Results would suggest adequate radiofrequency ablation.                  Plan:  Please see "Plan of Care" for details.                Laboratory Chemistry  Inflammation Markers (CRP: Acute Phase) (ESR: Chronic Phase) Lab Results  Component Value Date   CRP 1.0 10/25/2017   ESRSEDRATE 5  10/25/2017                         Rheumatology Markers Lab Results  Component Value Date   RF <10.0 10/25/2017   LABURIC 4.4 10/25/2017                        Renal Function Markers Lab Results  Component Value Date   BUN 24 06/23/2018   CREATININE 0.98 09/23/8249   BCR NOT APPLICABLE 03/70/4888   GFRAA >60 11/24/2017   GFRNONAA >60 11/24/2017                             Hepatic Function Markers Lab Results  Component Value Date   AST 22 06/23/2018   ALT 18 06/23/2018   ALBUMIN 4.3 11/24/2017   ALKPHOS 34 (L) 11/24/2017                        Electrolytes Lab Results  Component Value Date   NA 139 06/23/2018   K 4.4 06/23/2018   CL 106 06/23/2018  CALCIUM 9.9 06/23/2018                        Neuropathy Markers Lab Results  Component Value Date   HGBA1C 5.2 10/25/2017                        CNS Tests No results found for: COLORCSF, APPEARCSF, RBCCOUNTCSF, WBCCSF, POLYSCSF, LYMPHSCSF, EOSCSF, PROTEINCSF, GLUCCSF, JCVIRUS, CSFOLI, IGGCSF                      Bone Pathology Markers Lab Results  Component Value Date   TESTOFREE 112.3 06/23/2018   TESTOSTERONE 619 06/23/2018                         Coagulation Parameters Lab Results  Component Value Date   PLT 202 11/24/2017                        Cardiovascular Markers Lab Results  Component Value Date   HGB 14.8 11/24/2017   HCT 44.1 11/24/2017                         CA Markers No results found for: CEA, CA125, LABCA2                      Note: Lab results reviewed.  Recent Diagnostic Imaging Results  DG C-Arm 1-60 Min-No Report Fluoroscopy was utilized by the requesting physician.  No radiographic  interpretation.   Complexity Note: Imaging results reviewed. Results shared with Johnny Villa, using Layman's terms.                         Meds   Current Outpatient Medications:  .  acetaminophen (TYLENOL) 500 MG tablet, Take 500 mg by mouth every 6 (six) hours as needed., Disp: , Rfl:   .  busPIRone (BUSPAR) 30 MG tablet, 2 (two) times daily. , Disp: , Rfl:  .  Cholecalciferol (VITAMIN D PO), Take 5,000 Units by mouth daily. , Disp: , Rfl:  .  clomiPHENE (CLOMID) 50 MG tablet, Take 1 tablet (50 mg total) by mouth every other day., Disp: 60 tablet, Rfl: 0 .  clonazePAM (KLONOPIN) 0.5 MG tablet, , Disp: , Rfl:  .  fluticasone (FLONASE) 50 MCG/ACT nasal spray, PLACE 2 SPRAYS INTO EACH NOSTRIL QD, Disp: , Rfl:  .  lamoTRIgine (LAMICTAL) 200 MG tablet, Take by mouth daily. , Disp: , Rfl:  .  lithium carbonate 300 MG capsule, Take 300 mg by mouth daily. 900 mg  in am pm 1200 mg Dr. Jacklyn Shell Pray, Disp: , Rfl:  .  Multiple Vitamins-Minerals (MULTIVITAMIN MEN) TABS, Take 1 tablet by mouth daily., Disp: , Rfl:  .  SALINE MIST 0.65 % nasal spray, Place 1 application into both nostrils as directed., Disp: , Rfl:  .  sildenafil (REVATIO) 20 MG tablet, Take 2 tablets (40 mg total) by mouth daily. Prn, Disp: 20 tablet, Rfl: 2 .  SUMAtriptan (IMITREX) 100 MG tablet, Take 1 tablet (100 mg total) by mouth every 2 (two) hours as needed for migraine. Max dose 200 mg in 1 day, Disp: 10 tablet, Rfl: 2 .  traZODone (DESYREL) 100 MG tablet, Take 200 mg by mouth at bedtime., Disp: , Rfl:  .  albuterol (PROVENTIL HFA) 108 (90 Base)  MCG/ACT inhaler, Inhale into the lungs., Disp: , Rfl:  .  aspirin-acetaminophen-caffeine (EXCEDRIN MIGRAINE) 250-250-65 MG tablet, Take by mouth every 6 (six) hours as needed for headache., Disp: , Rfl:  .  buPROPion (WELLBUTRIN XL) 300 MG 24 hr tablet, Take 300 mg by mouth daily. , Disp: , Rfl:  .  dicyclomine (BENTYL) 10 MG capsule, Take 2 capsules (20 mg total) by mouth 4 (four) times daily -  before meals and at bedtime., Disp: 240 capsule, Rfl: 0 .  DULoxetine (CYMBALTA) 30 MG capsule, Take 60 mg by mouth daily. , Disp: , Rfl:  .  methocarbamol (ROBAXIN) 750 MG tablet, Take 1 tablet (750 mg total) by mouth every 8 (eight) hours as needed for muscle spasms., Disp: 90  tablet, Rfl: 0 .  NONFORMULARY OR COMPOUNDED ITEM, 10% Ketamine/2% Cyclobenzaprine/6% Gabapentin/ 2.5% Lidocaine Cream Sig: 1-2 ml to affected area 3-4 times/day. Dispense: 240 GM bottle, Disp: 1 each, Rfl: 2 .  Vitamins A & D 5000-400 units CAPS, , Disp: , Rfl:   ROS  Constitutional: Denies any fever or chills Gastrointestinal: No reported hemesis, hematochezia, vomiting, or acute GI distress Musculoskeletal: Denies any acute onset joint swelling, redness, loss of ROM, or weakness Neurological: No reported episodes of acute onset apraxia, aphasia, dysarthria, agnosia, amnesia, paralysis, loss of coordination, or loss of consciousness  Allergies  Johnny Villa has No Known Allergies.  PFSH  Drug: Johnny Villa  reports that he does not use drugs. Alcohol:  reports that he does not drink alcohol. Tobacco:  reports that he has been smoking cigarettes. He has never used smokeless tobacco. Medical:  has a past medical history of Bipolar disorder ([place]), Blood in stool, Depression, Drug abuse in remission, Fatty liver (01/2018), H/O alcohol abuse, Headache, Hypertension, and Low testosterone in male. Surgical: Johnny Villa  has a past surgical history that includes Appendectomy; Colonoscopy with propofol (N/A, 12/30/2017); and Esophagogastroduodenoscopy (egd) with propofol (N/A, 12/30/2017). Family: family history includes Alcohol abuse in his father; COPD in his father; Cancer in his father; Depression in his mother; Hyperlipidemia in his father; Hypertension in his father.  Constitutional Exam  General appearance: Well nourished, well developed, and well hydrated. In no apparent acute distress Vitals:   06/24/18 1006  BP: 133/89  Pulse: 70  Resp: 18  Temp: 98.7 F (37.1 C)  SpO2: 99%  Weight: 210 lb (95.3 kg)  Height: '5\' 11"'$  (1.803 m)   BMI Assessment: Estimated body mass index is 29.29 kg/m as calculated from the following:   Height as of this encounter: '5\' 11"'$  (1.803 m).   Weight as of this  encounter: 210 lb (95.3 kg).  BMI interpretation table: BMI level Category Range association with higher incidence of chronic pain  <18 kg/m2 Underweight   18.5-24.9 kg/m2 Ideal body weight   25-29.9 kg/m2 Overweight Increased incidence by 20%  30-34.9 kg/m2 Obese (Class I) Increased incidence by 68%  35-39.9 kg/m2 Severe obesity (Class II) Increased incidence by 136%  >40 kg/m2 Extreme obesity (Class III) Increased incidence by 254%   Patient's current BMI Ideal Body weight  Body mass index is 29.29 kg/m. Ideal body weight: 75.3 kg (166 lb 0.1 oz) Adjusted ideal body weight: 83.3 kg (183 lb 9.7 oz)   BMI Readings from Last 4 Encounters:  06/24/18 29.29 kg/m  06/19/18 31.46 kg/m  06/06/18 29.98 kg/m  05/19/18 29.99 kg/m   Wt Readings from Last 4 Encounters:  06/24/18 210 lb (95.3 kg)  06/19/18 225 lb 9.6 oz (102.3 kg)  06/06/18 214 lb 15.2 oz (97.5 kg)  05/19/18 215 lb (97.5 kg)  Psych/Mental status: Alert, oriented x 3 (person, place, & time)       Eyes: PERLA Respiratory: No evidence of acute respiratory distress  Cervical Spine Area Exam  Skin & Axial Inspection: No masses, redness, edema, swelling, or associated skin lesions Alignment: Symmetrical Functional ROM: Improved after treatment, to the right Stability: No instability detected Muscle Tone/Strength: Functionally intact. No obvious neuro-muscular anomalies detected. Sensory (Neurological): Musculoskeletal pain pattern Palpation: No palpable anomalies             4 out of 5 strength right upper extremity: Shoulder abduction, elbow flexion, elbow extension, thumb extension. 5 out of 5 strength left upper extremity: Shoulder abduction, elbow flexion, elbow extension, thumb extension.   Upper Extremity (UE) Exam    Side: Right upper extremity  Side: Left upper extremity  Skin & Extremity Inspection: Skin color, temperature, and hair growth are WNL. No peripheral edema or cyanosis. No masses, redness, swelling,  asymmetry, or associated skin lesions. No contractures.  Skin & Extremity Inspection: Skin color, temperature, and hair growth are WNL. No peripheral edema or cyanosis. No masses, redness, swelling, asymmetry, or associated skin lesions. No contractures.  Functional ROM: Unrestricted ROM          Functional ROM: Unrestricted ROM          Muscle Tone/Strength: Functionally intact. No obvious neuro-muscular anomalies detected.  Muscle Tone/Strength: Functionally intact. No obvious neuro-muscular anomalies detected.  Sensory (Neurological): Unimpaired          Sensory (Neurological): Unimpaired          Palpation: No palpable anomalies              Palpation: No palpable anomalies              Provocative Test(s):  Phalen's test: deferred Tinel's test: deferred Apley's scratch test (touch opposite shoulder):  Action 1 (Across chest): deferred Action 2 (Overhead): deferred Action 3 (LB reach): deferred   Provocative Test(s):  Phalen's test: deferred Tinel's test: deferred Apley's scratch test (touch opposite shoulder):  Action 1 (Across chest): deferred Action 2 (Overhead): deferred Action 3 (LB reach): deferred    Patient with tremor of his right hand, less prominent tremor of his left hand.  Tremor intensity decreased at some instances stopped altogether during our conversation however at times tremor returned.  Thoracic Spine Area Exam  Skin & Axial Inspection: No masses, redness, or swelling Alignment: Symmetrical Functional ROM: Unrestricted ROM Stability: No instability detected Muscle Tone/Strength: Functionally intact. No obvious neuro-muscular anomalies detected. Sensory (Neurological): Unimpaired Muscle strength & Tone: No palpable anomalies  Lumbar Spine Area Exam  Skin & Axial Inspection: No masses, redness, or swelling Alignment: Symmetrical Functional ROM: Unrestricted ROM       Stability: No instability detected Muscle Tone/Strength: Functionally intact. No obvious  neuro-muscular anomalies detected. Sensory (Neurological): Unimpaired Palpation: No palpable anomalies       Provocative Tests: Hyperextension/rotation test: deferred today       Lumbar quadrant test (Kemp's test): deferred today       Lateral bending test: deferred today       Patrick's Maneuver: deferred today                   FABER test: deferred today                   S-I anterior distraction/compression test: deferred today  S-I lateral compression test: deferred today         S-I Thigh-thrust test: deferred today         S-I Gaenslen's test: deferred today          Gait & Posture Assessment  Ambulation: Unassisted Gait: Relatively normal for age and body habitus Posture: WNL   Lower Extremity Exam    Side: Right lower extremity  Side: Left lower extremity  Stability: No instability observed          Stability: No instability observed          Skin & Extremity Inspection: Skin color, temperature, and hair growth are WNL. No peripheral edema or cyanosis. No masses, redness, swelling, asymmetry, or associated skin lesions. No contractures.  Skin & Extremity Inspection: Skin color, temperature, and hair growth are WNL. No peripheral edema or cyanosis. No masses, redness, swelling, asymmetry, or associated skin lesions. No contractures.  Functional ROM: Unrestricted ROM                  Functional ROM: Unrestricted ROM                  Muscle Tone/Strength: Functionally intact. No obvious neuro-muscular anomalies detected.  Muscle Tone/Strength: Functionally intact. No obvious neuro-muscular anomalies detected.  Sensory (Neurological): Unimpaired  Sensory (Neurological): Unimpaired  Palpation: No palpable anomalies  Palpation: No palpable anomalies   Assessment  Primary Diagnosis & Pertinent Problem List: The primary encounter diagnosis was Cervical spondylosis without myelopathy (C4,5,6,7). Diagnoses of Cervical facet joint syndrome, Chronic pain syndrome, Cervicalgia,  Chronic bilateral low back pain with bilateral sciatica, and Tremors of nervous system were also pertinent to this visit.  Status Diagnosis  Responding Improved Improving 1. Cervical spondylosis without myelopathy (C4,5,6,7)   2. Cervical facet joint syndrome   3. Chronic pain syndrome   4. Cervicalgia   5. Chronic bilateral low back pain with bilateral sciatica   6. Tremors of nervous system     General Recommendations: The pain condition that the patient suffers from is best treated with a multidisciplinary approach that involves an increase in physical activity to prevent de-conditioning and worsening of the pain cycle, as well as psychological counseling (formal and/or informal) to address the co-morbid psychological affects of pain. Treatment will often involve judicious use of pain medications and interventional procedures to decrease the pain, allowing the patient to participate in the physical activity that will ultimately produce long-lasting pain reductions. The goal of the multidisciplinary approach is to return the patient to a higher level of overall function and to restore their ability to perform activities of daily living.  42 year old male who follows up status post right C4, C5, C6, C7 radiofrequency ablation who endorses approximately 75% pain relief that is ongoing in regards to his right neck, right shoulder, right periscapular pain.  Patient states that he has developed a tremor and is bilateral hands, right greater than left.  This could be associated with the increase in his Cymbalta.  Patient's primary care physician has scheduled him for a brain MRI which I think is reasonable.  I also recommend the patient see a neurologist for his tremor.  Patient states that his pain has improved but now he is dealing with spasms of his right neck and shoulder region along with this tremor.  Of note the patient's tremor did decrease and at times completely stop during our conversation.   No significant or obvious weakness of his upper extremities.  Patient is pleased  with results from his right cervical radiofrequency ablation.  In regards to medication management, patient states that he has dealt with severe constipation when he was on oxycodone.  I informed the patient that this was only for a short-term as we completed his radiofrequency ablation and now that he has obtaining benefit from this, opioid therapy is no longer indicated.  I recommended the patient try a compounded cream with ketamine, Flexeril, gabapentin and lidocaine that he can apply to his right neck region.  I will also prescribe the patient Robaxin for his cervical myofascial pain and paraspinal muscle spasms.  Plan of Care  Pharmacotherapy (Medications Ordered): Meds ordered this encounter  Medications  . NONFORMULARY OR COMPOUNDED ITEM    Sig: 10% Ketamine/2% Cyclobenzaprine/6% Gabapentin/ 2.5% Lidocaine Cream Sig: 1-2 ml to affected area 3-4 times/day. Dispense: 240 GM bottle    Dispense:  1 each    Refill:  2    Do not place this medication, or any other prescription from our practice, on "Automatic Refill". Patient may have prescription filled one day early if pharmacy is closed on scheduled refill date.  . methocarbamol (ROBAXIN) 750 MG tablet    Sig: Take 1 tablet (750 mg total) by mouth every 8 (eight) hours as needed for muscle spasms.    Dispense:  90 tablet    Refill:  0    Do not place this medication, or any other prescription from our practice, on "Automatic Refill". Patient may have prescription filled one day early if pharmacy is closed on scheduled refill date.   Lab-work, procedure(s), and/or referral(s): Orders Placed This Encounter  Procedures  . Ambulatory referral to Neurology    Time Note: Greater than 50% of the 25 minute(s) of face-to-face time spent with Johnny Villa, was spent in counseling/coordination of care regarding: Johnny Villa primary cause of pain, the treatment  plan, medication side effects, the results, interpretation and significance of  his recent diagnostic interventional treatment(s), realistic expectations and the goals of pain management (increased in functionality).  Future Appointments  Date Time Provider [place]  08/01/2018 10:00 AM McLean-Scocuzza, Nino Glow, MD [facility]    Primary Care Physician: McLean-Scocuzza, Nino Glow, MD Location: [facility] Note by: Gillis Santa, M.D Date: 06/24/2018; Time: 3:34 PM  There are no Patient Instructions on file for this visit.

## 2018-06-24 NOTE — Progress Notes (Signed)
Safety precautions to be maintained throughout the outpatient stay will include: orient to surroundings, keep bed in low position, maintain call bell within reach at all times, provide assistance with transfer out of bed and ambulation.  

## 2018-06-25 LAB — UNLABELED

## 2018-06-26 ENCOUNTER — Telehealth: Payer: Self-pay

## 2018-06-26 NOTE — Telephone Encounter (Signed)
Copied from [place] [id number]. Topic: General - Other >> Jun 26, 2018 11:54 AM Lennox Solders wrote: Reason for CRM: Johnny Villa is calling from dr hasan office. Please refax blood work to [phone]  refaxed lab result to [facility] at Dr. Felicie Morn office to the number provided

## 2018-06-27 ENCOUNTER — Encounter: Payer: Self-pay | Admitting: Internal Medicine

## 2018-06-27 DIAGNOSIS — N529 Male erectile dysfunction, unspecified: Secondary | ICD-10-CM

## 2018-06-27 MED ORDER — SILDENAFIL CITRATE 20 MG PO TABS: 40.0000 mg | ORAL_TABLET | Freq: Every day | ORAL | 0 refills | Status: DC

## 2018-06-27 MED ORDER — SILDENAFIL CITRATE 20 MG PO TABS: 40.0000 mg | ORAL_TABLET | Freq: Every day | ORAL | 2 refills | Status: DC

## 2018-06-28 LAB — COMPREHENSIVE METABOLIC PANEL
AG Ratio: 1.6 (calc) (ref 1.0–2.5)
ALT: 18 U/L (ref 9–46)
AST: 22 U/L (ref 10–40)
Albumin: 4.5 g/dL (ref 3.6–5.1)
Alkaline phosphatase (APISO): 40 U/L (ref 40–115)
BUN: 24 mg/dL (ref 7–25)
CO2: 21 mmol/L (ref 20–32)
Calcium: 9.9 mg/dL (ref 8.6–10.3)
Chloride: 106 mmol/L (ref 98–110)
Creat: 0.98 mg/dL (ref 0.60–1.35)
Globulin: 2.9 g/dL (calc) (ref 1.9–3.7)
Glucose, Bld: 83 mg/dL (ref 65–99)
Potassium: 4.4 mmol/L (ref 3.5–5.3)
Sodium: 139 mmol/L (ref 135–146)
Total Bilirubin: 0.8 mg/dL (ref 0.2–1.2)
Total Protein: 7.4 g/dL (ref 6.1–8.1)

## 2018-06-28 LAB — PAT ID TIQ DOC: Test Affected: 6399

## 2018-06-28 LAB — LAMOTRIGINE LEVEL: Lamotrigine Lvl: 0.7 ug/mL — ABNORMAL LOW (ref 4.0–18.0)

## 2018-06-28 LAB — CBC WITH DIFFERENTIAL/PLATELET
Basophils Absolute: 70 cells/uL (ref 0–200)
Basophils Relative: 1.2 %
Eosinophils Absolute: 70 cells/uL (ref 15–500)
Eosinophils Relative: 1.2 %
HCT: 48 % (ref 38.5–50.0)
Hemoglobin: 15.7 g/dL (ref 13.2–17.1)
Lymphs Abs: 1137 cells/uL (ref 850–3900)
MCH: 32.9 pg (ref 27.0–33.0)
MCHC: 32.7 g/dL (ref 32.0–36.0)
MCV: 100.6 fL — ABNORMAL HIGH (ref 80.0–100.0)
MPV: 10.9 fL (ref 7.5–12.5)
Monocytes Relative: 9.9 %
Neutro Abs: 3950 cells/uL (ref 1500–7800)
Neutrophils Relative %: 68.1 %
Platelets: 205 10*3/uL (ref 140–400)
RBC: 4.77 10*6/uL (ref 4.20–5.80)
RDW: 12.4 % (ref 11.0–15.0)
Total Lymphocyte: 19.6 %
WBC mixed population: 574 cells/uL (ref 200–950)
WBC: 5.8 10*3/uL (ref 3.8–10.8)

## 2018-06-28 LAB — TEST AUTHORIZATION

## 2018-06-28 LAB — TESTOSTERONE TOTAL,FREE,BIO, MALES
Albumin: 4.5 g/dL (ref 3.6–5.1)
Sex Hormone Binding: 24 nmol/L (ref 10–50)
Testosterone, Bioavailable: 231 ng/dL (ref >=110.0)
Testosterone, Free: 112.3 pg/mL (ref 46.0–224.0)
Testosterone: 619 ng/dL (ref 250–827)

## 2018-06-28 LAB — LITHIUM LEVEL: Lithium Lvl: 0.4 mmol/L — ABNORMAL LOW (ref 0.6–1.2)

## 2018-06-28 LAB — TSH: TSH: 0.97 mIU/L (ref 0.40–4.50)

## 2018-06-30 ENCOUNTER — Encounter: Payer: Self-pay | Admitting: *Deleted

## 2018-07-07 ENCOUNTER — Ambulatory Visit (INDEPENDENT_AMBULATORY_CARE_PROVIDER_SITE_OTHER): Payer: Medicare PPO

## 2018-07-07 DIAGNOSIS — X58XXXD Exposure to other specified factors, subsequent encounter: Secondary | ICD-10-CM

## 2018-07-07 DIAGNOSIS — S069X0D Unspecified intracranial injury without loss of consciousness, subsequent encounter: Secondary | ICD-10-CM | POA: Diagnosis not present

## 2018-07-07 DIAGNOSIS — R519 Headache, unspecified: Secondary | ICD-10-CM

## 2018-07-07 DIAGNOSIS — R51 Headache: Secondary | ICD-10-CM

## 2018-07-07 DIAGNOSIS — R251 Tremor, unspecified: Secondary | ICD-10-CM

## 2018-07-08 ENCOUNTER — Encounter: Payer: Self-pay | Admitting: *Deleted

## 2018-07-15 DIAGNOSIS — G43709 Chronic migraine without aura, not intractable, without status migrainosus: Secondary | ICD-10-CM | POA: Insufficient documentation

## 2018-07-15 DIAGNOSIS — R251 Tremor, unspecified: Secondary | ICD-10-CM | POA: Insufficient documentation

## 2018-07-18 ENCOUNTER — Telehealth: Payer: Self-pay | Admitting: *Deleted

## 2018-07-21 MED ORDER — METHOCARBAMOL 750 MG PO TABS: 750.0000 mg | ORAL_TABLET | Freq: Three times a day (TID) | ORAL | 3 refills | Status: DC | PRN

## 2018-07-21 NOTE — Telephone Encounter (Signed)
Robaxin Rx sent in

## 2018-08-01 ENCOUNTER — Ambulatory Visit (INDEPENDENT_AMBULATORY_CARE_PROVIDER_SITE_OTHER): Payer: 59 | Admitting: Internal Medicine

## 2018-08-01 ENCOUNTER — Encounter: Payer: Self-pay | Admitting: Internal Medicine

## 2018-08-01 VITALS — BP 138/92 | HR 76 | Temp 98.4°F | Ht 71.0 in | Wt 233.6 lb

## 2018-08-01 DIAGNOSIS — F319 Bipolar disorder, unspecified: Secondary | ICD-10-CM

## 2018-08-01 DIAGNOSIS — N529 Male erectile dysfunction, unspecified: Secondary | ICD-10-CM

## 2018-08-01 DIAGNOSIS — K259 Gastric ulcer, unspecified as acute or chronic, without hemorrhage or perforation: Secondary | ICD-10-CM | POA: Diagnosis not present

## 2018-08-01 DIAGNOSIS — T300 Burn of unspecified body region, unspecified degree: Secondary | ICD-10-CM | POA: Diagnosis not present

## 2018-08-01 DIAGNOSIS — G43809 Other migraine, not intractable, without status migrainosus: Secondary | ICD-10-CM

## 2018-08-01 DIAGNOSIS — G43909 Migraine, unspecified, not intractable, without status migrainosus: Secondary | ICD-10-CM | POA: Insufficient documentation

## 2018-08-01 MED ORDER — MUPIROCIN 2 % EX OINT: 1.0000 "application " | TOPICAL_OINTMENT | Freq: Two times a day (BID) | CUTANEOUS | 0 refills | Status: DC

## 2018-08-01 MED ORDER — SUMATRIPTAN SUCCINATE 100 MG PO TABS: ORAL_TABLET | ORAL | 2 refills | Status: DC

## 2018-08-01 MED ORDER — SILDENAFIL CITRATE 20 MG PO TABS: 20.0000 mg | ORAL_TABLET | Freq: Two times a day (BID) | ORAL | 5 refills | Status: DC

## 2018-08-01 MED ORDER — PANTOPRAZOLE SODIUM 40 MG PO TBEC: 40.0000 mg | DELAYED_RELEASE_TABLET | Freq: Every day | ORAL | 3 refills | Status: DC

## 2018-08-01 NOTE — Patient Instructions (Signed)

## 2018-08-01 NOTE — Progress Notes (Signed)
Pre visit review using our clinic review tool, if applicable. No additional management support is needed unless otherwise documented below in the visit note. 

## 2018-08-01 NOTE — Progress Notes (Signed)
Chief Complaint  Patient presents with  . Follow-up   F/u  1. Right hand middle finger burn from recent burn from cooking with open wound w/o pain  2. Would like refills on imitrex, revatio 20 mg bid and wants ppi due to history of ulcer  3. Bipolar he is seeing psychiatry q2 weeks lithium stopped 2/2 tremor and has improved and changed to vraylar 1.5 mg qd given samples from pyschi  Review of Systems  Constitutional: Negative for weight loss.  HENT: Negative for hearing loss.   Eyes: Negative for blurred vision.  Respiratory: Negative for shortness of breath.   Cardiovascular: Negative for chest pain.  Skin:       Open wound    Neurological: Negative for headaches.   Past Medical History:  Diagnosis Date  . Bipolar disorder [facility])    psychiatrist in [place]  . Blood in stool   . Depression   . Drug abuse in remission [facility])    sober for 6 years  . Fatty liver 01/2018  . H/O alcohol abuse   . Headache    migraines  . Hypertension   . Low testosterone in male    Past Surgical History:  Procedure Laterality Date  . APPENDECTOMY     2006  . COLONOSCOPY WITH PROPOFOL N/A 12/30/2017   Procedure: COLONOSCOPY WITH PROPOFOL;  Surgeon: Lin Landsman, MD;  Location: [facility] ENDOSCOPY;  Service: Gastroenterology;  Laterality: N/A;  . ESOPHAGOGASTRODUODENOSCOPY (EGD) WITH PROPOFOL N/A 12/30/2017   Procedure: ESOPHAGOGASTRODUODENOSCOPY (EGD) WITH PROPOFOL;  Surgeon: Lin Landsman, MD;  Location: [facility] ENDOSCOPY;  Service: Gastroenterology;  Laterality: N/A;   Family History  Problem Relation Age of Onset  . Depression Mother   . Alcohol abuse Father   . Cancer Father        prostate dx'ed 72  . COPD Father   . Hyperlipidemia Father   . Hypertension Father    Social History   Socioeconomic History  . Marital status: Single    Spouse name: Not on file  . Number of children: Not on file  . Years of education: Not on file  . Highest education level: Not on file   Occupational History  . Occupation: disabled  Social Needs  . Financial resource strain: Not hard at all  . Food insecurity:    Worry: Patient refused    Inability: Patient refused  . Transportation needs:    Medical: Patient refused    Non-medical: Patient refused  Tobacco Use  . Smoking status: Current Every Day Smoker    Types: Cigarettes    Last attempt to quit: 2016    Years since quitting: 3.8  . Smokeless tobacco: Never Used  Substance and Sexual Activity  . Alcohol use: No    Frequency: Never    Comment: former quit 03/14/2012  . Drug use: No    Comment: former quit cocaine 03/14/2012   . Sexual activity: Yes  Lifestyle  . Physical activity:    Days per week: Patient refused    Minutes per session: Patient refused  . Stress: Not on file  Relationships  . Social connections:    Talks on phone: Patient refused    Gets together: Patient refused    Attends religious service: Patient refused    Active member of club or organization: Patient refused    Attends meetings of clubs or organizations: Patient refused    Relationship status: Patient refused  . Intimate partner violence:    Fear of  current or ex partner: Patient refused    Emotionally abused: Patient refused    Physically abused: Patient refused    Forced sexual activity: Patient refused  Other Topics Concern  . Not on file  Social History Narrative   No kids   MBA   Disabled    Current Meds  Medication Sig  . acetaminophen (TYLENOL) 500 MG tablet Take 500 mg by mouth every 6 (six) hours as needed.  Marland Kitchen albuterol (PROVENTIL HFA) 108 (90 Base) MCG/ACT inhaler Inhale into the lungs.  Marland Kitchen aspirin-acetaminophen-caffeine (EXCEDRIN MIGRAINE) 250-250-65 MG tablet Take by mouth every 6 (six) hours as needed for headache.  . busPIRone (BUSPAR) 30 MG tablet 2 (two) times daily.   . clomiPHENE (CLOMID) 50 MG tablet Take 1 tablet (50 mg total) by mouth every other day.  . clonazePAM (KLONOPIN) 0.5 MG tablet   .  fluticasone (FLONASE) 50 MCG/ACT nasal spray PLACE 2 SPRAYS INTO EACH NOSTRIL QD  . lamoTRIgine (LAMICTAL) 200 MG tablet Take by mouth daily.   . methocarbamol (ROBAXIN) 750 MG tablet Take 1 tablet (750 mg total) by mouth every 8 (eight) hours as needed for muscle spasms.  . Multiple Vitamins-Minerals (MULTIVITAMIN MEN) TABS Take 1 tablet by mouth daily.  Marland Kitchen SALINE MIST 0.65 % nasal spray Place 1 application into both nostrils as directed.  . sildenafil (REVATIO) 20 MG tablet Take 1 tablet (20 mg total) by mouth 2 (two) times daily. Prn  . SUMAtriptan (IMITREX) 100 MG tablet May repeat in 2 hours if not helping. Max dose 200 mg in 1 day. Do not take more than 2x per week  . traZODone (DESYREL) 100 MG tablet Take 200 mg by mouth at bedtime.  . [DISCONTINUED] sildenafil (REVATIO) 20 MG tablet Take 2 tablets (40 mg total) by mouth daily. Prn (Patient taking differently: Take 20 mg by mouth 2 (two) times daily. Prn)  . [DISCONTINUED] SUMAtriptan (IMITREX) 100 MG tablet Take 1 tablet (100 mg total) by mouth every 2 (two) hours as needed for migraine. Max dose 200 mg in 1 day   No Known Allergies Recent Results (from the past 2160 hour(s))  TSH     Status: None   Collection Time: 06/23/18  9:44 AM  Result Value Ref Range   TSH 0.97 0.40 - 4.50 mIU/L  CBC with Differential/Platelet     Status: Abnormal   Collection Time: 06/23/18  9:44 AM  Result Value Ref Range   WBC 5.8 3.8 - 10.8 Thousand/uL   RBC 4.77 4.20 - 5.80 Million/uL   Hemoglobin 15.7 13.2 - 17.1 g/dL   HCT 48.0 38.5 - 50.0 %   MCV 100.6 (H) 80.0 - 100.0 fL   MCH 32.9 27.0 - 33.0 pg   MCHC 32.7 32.0 - 36.0 g/dL   RDW 12.4 11.0 - 15.0 %   Platelets 205 140 - 400 Thousand/uL   MPV 10.9 7.5 - 12.5 fL   Neutro Abs 3,950 1,500 - 7,800 cells/uL   Lymphs Abs 1,137 850 - 3,900 cells/uL   WBC mixed population 574 200 - 950 cells/uL   Eosinophils Absolute 70 15 - 500 cells/uL   Basophils Absolute 70 0 - 200 cells/uL   Neutrophils Relative  % 68.1 %   Total Lymphocyte 19.6 %   Monocytes Relative 9.9 %   Eosinophils Relative 1.2 %   Basophils Relative 1.2 %    Comment: . We received your handwritten test order and performed CBC with differential (includes hemogram, platelet count and WBC  with automated differential). If you intended to order a CBC without differential or another test, please contact us at [phone]), to adjust the billing appropriately.   Comprehensive metabolic panel     Status: None   Collection Time: 06/23/18  9:44 AM  Result Value Ref Range   Glucose, Bld 83 65 - 99 mg/dL    Comment: .            Fasting reference interval .    BUN 24 7 - 25 mg/dL   Creat 0.98 0.60 - 1.35 mg/dL   BUN/Creatinine Ratio NOT APPLICABLE 6 - 22 (calc)   Sodium 139 135 - 146 mmol/L   Potassium 4.4 3.5 - 5.3 mmol/L   Chloride 106 98 - 110 mmol/L   CO2 21 20 - 32 mmol/L   Calcium 9.9 8.6 - 10.3 mg/dL   Total Protein 7.4 6.1 - 8.1 g/dL   Albumin 4.5 3.6 - 5.1 g/dL   Globulin 2.9 1.9 - 3.7 g/dL (calc)   AG Ratio 1.6 1.0 - 2.5 (calc)   Total Bilirubin 0.8 0.2 - 1.2 mg/dL   Alkaline phosphatase (APISO) 40 40 - 115 U/L   AST 22 10 - 40 U/L   ALT 18 9 - 46 U/L  Testosterone Total,Free,Bio, Males-([facility])     Status: None   Collection Time: 06/23/18  9:44 AM  Result Value Ref Range   Testosterone 619 250 - 827 ng/dL   Albumin 4.5 3.6 - 5.1 g/dL   Sex Hormone Binding 24 10 - 50 nmol/L   Testosterone, Free 112.3 46.0 - 224.0 pg/mL   Testosterone, Bioavailable 231.0 110.0 - 575 ng/dL  Lamotrigine level     Status: Abnormal   Collection Time: 06/23/18  9:44 AM  Result Value Ref Range   Lamotrigine Lvl 0.7 (L) 4.0 - 18.0 mcg/mL    Comment:  This test was developed and its analytical performance  characteristics have been determined by [facility]. It has not been cleared or approved by the [place] Food and Drug Administration. This assay has been validated  pursuant to  the CLIA regulations and is used for clinical  purposes.   Lithium level     Status: Abnormal   Collection Time: 06/23/18  9:44 AM  Result Value Ref Range   Lithium Lvl 0.4 (L) 0.6 - 1.2 mmol/L  UNLABELED     Status: None   Collection Time: 06/23/18  9:44 AM  Result Value Ref Range   Message      Comment: . The labeling of the requisition and/or specimen(s) is in question. Marland Kitchen    Specimen Type LAV    Name on Specimen NONE    Test Ordered On Req NG     Comment: REQUESTED INFORMATION _________________________________ . AUTHORIZED SIGNATURE __________________________________ . TO PREVENT FURTHER DELAYS IN TESTING, PLEASE COMPLETE INFORMATION ABOVE AND FAX TO [phone] TO RESOLVE  THIS ORDER.   TEST AUTHORIZATION     Status: None   Collection Time: 06/23/18  9:44 AM  Result Value Ref Range   TEST NAME: CBC (INCLUDES DIFF/PLT)    TEST CODE: [id number]    CLIENT CONTACT: DR MCLEAN-SCOCUZZA    REPORT ALWAYS MESSAGE SIGNATURE      Comment: . The laboratory testing on this patient was verbally requested or confirmed by the ordering physician or his or her authorized representative after contact with an employee of [employer]. Federal regulations require that we maintain on file written authorization for all laboratory  testing.  Accordingly we are asking that the ordering physician or his or her authorized representative sign a copy of this report and promptly return it to the client service representative. . . Signature:____________________________________________________ . Please fax this signed page to [phone] or return it via your [employer] courier.   PAT ID [id number]     Status: None   Collection Time: 06/23/18  9:44 AM  Result Value Ref Range   COMMENT      Comment: Identification of test requisition and/or specimen(s) was questionable. The below named individual provided this revised patient identification.    Contact CLIENT    Test Affected  6,399    Objective  Body mass index is 32.58 kg/m. Wt Readings from Last 3 Encounters:  08/01/18 233 lb 9.6 oz (106 kg)  06/24/18 210 lb (95.3 kg)  06/19/18 225 lb 9.6 oz (102.3 kg)   Temp Readings from Last 3 Encounters:  08/01/18 98.4 F (36.9 C) (Oral)  06/24/18 98.7 F (37.1 C)  06/19/18 98.8 F (37.1 C) (Oral)   BP Readings from Last 3 Encounters:  08/01/18 (!) 138/92  06/24/18 133/89  06/19/18 132/78   Pulse Readings from Last 3 Encounters:  08/01/18 76  06/24/18 70  06/19/18 84    Physical Exam  Constitutional: He is oriented to person, place, and time. Vital signs are normal. He appears well-developed and well-nourished. He is cooperative.  HENT:  Head: Normocephalic and atraumatic.  Mouth/Throat: Oropharynx is clear and moist and mucous membranes are normal.  Eyes: Pupils are equal, round, and reactive to light. Conjunctivae are normal.  Cardiovascular: Normal rate, regular rhythm and normal heart sounds.  Pulmonary/Chest: Effort normal and breath sounds normal.  Neurological: He is alert and oriented to person, place, and time. Gait normal.  Skin: Skin is warm, dry and intact.  Psychiatric: He has a normal mood and affect. His speech is normal and behavior is normal. Judgment and thought content normal. Cognition and memory are normal.  Nursing note and vitals reviewed.   Assessment   1. Right middle finger burn  2. Migraines controlled  3. Bipolar 1  4. HM 5. H/o GI ulcer  Plan   1. bactroban and wound care  2. Refilled imitrex 100 mg max 200 total per day  MRI neg except mild sinus disease 3. F/u with psych in [place] q 2 weeks asked he have psych fax me the note  4.  Flu shot utd Tdap utd  Declines MMR lab check  HIV neg 05/24/17  See labs 06/2018  5. protonix 40 mg qd  Provider: Dr. Olivia Mackie McLean-Scocuzza-Internal Medicine

## 2018-08-03 ENCOUNTER — Encounter: Payer: Self-pay | Admitting: Internal Medicine

## 2018-08-04 ENCOUNTER — Other Ambulatory Visit: Payer: Self-pay | Admitting: Internal Medicine

## 2018-08-04 DIAGNOSIS — N529 Male erectile dysfunction, unspecified: Secondary | ICD-10-CM

## 2018-08-04 MED ORDER — SILDENAFIL CITRATE 20 MG PO TABS: 20.0000 mg | ORAL_TABLET | Freq: Two times a day (BID) | ORAL | 5 refills | Status: DC

## 2018-08-13 ENCOUNTER — Encounter: Payer: Self-pay | Admitting: *Deleted

## 2018-08-13 ENCOUNTER — Telehealth: Payer: Self-pay | Admitting: Student in an Organized Health Care Education/Training Program

## 2018-08-13 NOTE — Telephone Encounter (Signed)
Procedural history: 02/05/2018: Bilateral C4, C5, C6, C7 medial branch nerve block #1 03/17/2018: Bilateral C4, C5, C6, C7 medial branch nerve block #2 05/19/2018: Right C4, C5, C6, C7 radio frequency ablation

## 2018-08-13 NOTE — Telephone Encounter (Signed)
Patient would like to get a letter or statement with the dates of his procedures and what procedure he had done.

## 2018-08-13 NOTE — Telephone Encounter (Signed)
Patient notified per voicemail that he can pick up letter requested.

## 2018-08-30 ENCOUNTER — Emergency Department
Admission: EM | Admit: 2018-08-30 | Discharge: 2018-08-30 | Disposition: A | Discharge status: Home / Self Care | Payer: Medicare PPO | Attending: Emergency Medicine | Admitting: Emergency Medicine

## 2018-08-30 ENCOUNTER — Other Ambulatory Visit: Payer: Self-pay

## 2018-08-30 ENCOUNTER — Emergency Department: Payer: Medicare PPO

## 2018-08-30 DIAGNOSIS — J40 Bronchitis, not specified as acute or chronic: Secondary | ICD-10-CM | POA: Diagnosis not present

## 2018-08-30 DIAGNOSIS — R509 Fever, unspecified: Secondary | ICD-10-CM | POA: Diagnosis present

## 2018-08-30 DIAGNOSIS — F17210 Nicotine dependence, cigarettes, uncomplicated: Secondary | ICD-10-CM | POA: Diagnosis not present

## 2018-08-30 DIAGNOSIS — I10 Essential (primary) hypertension: Secondary | ICD-10-CM | POA: Insufficient documentation

## 2018-08-30 DIAGNOSIS — R05 Cough: Secondary | ICD-10-CM | POA: Insufficient documentation

## 2018-08-30 DIAGNOSIS — Z79899 Other long term (current) drug therapy: Secondary | ICD-10-CM | POA: Insufficient documentation

## 2018-08-30 LAB — CBC
HCT: 50.1 % (ref 39.0–52.0)
Hemoglobin: 17.2 g/dL — ABNORMAL HIGH (ref 13.0–17.0)
MCH: 33 pg (ref 26.0–34.0)
MCHC: 34.3 g/dL (ref 30.0–36.0)
MCV: 96 fL (ref 80.0–100.0)
Platelets: 190 10*3/uL (ref 150–400)
RBC: 5.22 MIL/uL (ref 4.22–5.81)
RDW: 12.9 % (ref 11.5–15.5)
WBC: 9.8 10*3/uL (ref 4.0–10.5)
nRBC: 0 % (ref 0.0–0.2)

## 2018-08-30 LAB — BASIC METABOLIC PANEL
Anion gap: 8 (ref 5–15)
BUN: 15 mg/dL (ref 6–20)
CO2: 27 mmol/L (ref 22–32)
Calcium: 9.3 mg/dL (ref 8.9–10.3)
Chloride: 103 mmol/L (ref 98–111)
Creatinine, Ser: 0.9 mg/dL (ref 0.61–1.24)
GFR calc Af Amer: >60 mL/min (ref >60)
GFR calc non Af Amer: >60 mL/min (ref >60)
Glucose, Bld: 125 mg/dL — ABNORMAL HIGH (ref 70–99)
Potassium: 4.3 mmol/L (ref 3.5–5.1)
Sodium: 138 mmol/L (ref 135–145)

## 2018-08-30 LAB — TROPONIN I: Troponin I: <0.03 ng/mL (ref <0.03)

## 2018-08-30 MED ORDER — BENZONATATE 100 MG PO CAPS: 100.0000 mg | ORAL_CAPSULE | Freq: Three times a day (TID) | ORAL | 0 refills | Status: DC | PRN

## 2018-08-30 MED ORDER — PREDNISONE 10 MG PO TABS: ORAL_TABLET | ORAL | 0 refills | Status: DC

## 2018-08-30 MED ORDER — AZITHROMYCIN 250 MG PO TABS: ORAL_TABLET | ORAL | 0 refills | Status: DC

## 2018-08-30 MED ORDER — ALBUTEROL SULFATE HFA 108 (90 BASE) MCG/ACT IN AERS: 2.0000 | INHALATION_SPRAY | Freq: Four times a day (QID) | RESPIRATORY_TRACT | 0 refills | Status: DC | PRN

## 2018-08-30 MED ORDER — PSEUDOEPH-BROMPHEN-DM 30-2-10 MG/5ML PO SYRP: 5.0000 mL | ORAL_SOLUTION | Freq: Four times a day (QID) | ORAL | 0 refills | Status: DC | PRN

## 2018-08-30 MED ORDER — IPRATROPIUM-ALBUTEROL 0.5-2.5 (3) MG/3ML IN SOLN
3.0000 mL | Freq: Once | RESPIRATORY_TRACT | Status: AC
  Administered 2018-08-30: 3 mL via RESPIRATORY_TRACT
  Filled 2018-08-30: qty 3

## 2018-08-30 NOTE — ED Notes (Signed)
Pt up to stat desk stating that "I have been here over two and a half hours and still have not been seen". Pt informed that pt's are taken back by wait time and/or acuity and that he will be the next pt back unless a more critical pt comes first. Pt verbalized understanding and ambulatory with steady gait back to his seat.

## 2018-08-30 NOTE — ED Triage Notes (Signed)
Pt arrives to ED via POV from home with c/o cough, congestion, headache, and SHOB d/t "being exposed to someone with pneumonia and bronchitis". Pt denies N/V, but reports "6-7" episodes of diarrhea over the last 5 days.

## 2018-08-30 NOTE — ED Notes (Signed)
Pt ambulatory to BR and back again with steady gait and in NAD.

## 2018-08-30 NOTE — ED Provider Notes (Signed)
[facility] Emergency Department Provider Note  ____________________________________________  Time seen: Approximately 7:22 AM  I have reviewed the triage vital signs and the nursing notes.   HISTORY  Chief Complaint URI    HPI Johnny Villa is a 42 y.o. male that presents emergency department for evaluation of fever, nasal congestion, sore throat, productive cough for 5 days.  Patient states that he has a cough with yellow sputum but is not coughing up enough.  He feels chest tightness and short of breath with coughing.  He states that he has had a fever on and off.  Last night he soaked through with the bed sheets.  He smokes a half a pack of cigarettes per day, more when he is stressed.  He has allergies and uses daily Flonase.  He has 3 male contacts that have pneumonia and bronchitis.  He has been using throat lozenges and Mucinex, with minimal relief.  Past Medical History:  Diagnosis Date  . Bipolar disorder [facility])    psychiatrist in [place]  . Blood in stool   . Depression   . Drug abuse in remission [facility])    sober for 6 years  . Fatty liver 01/2018  . H/O alcohol abuse   . Headache    migraines  . Hypertension   . Low testosterone in male     Patient Active Problem List   Diagnosis Date Noted  . Migraine 08/01/2018  . Gastric ulcer 08/01/2018  . Hyperlipidemia 06/20/2018  . Cervical spondylosis without myelopathy (C4,5,6,7) 01/08/2018  . Cervical facet joint syndrome 01/08/2018  . DDD (degenerative disc disease), cervical 01/08/2018  . Epigastric pain 11/28/2017  . Anal fissure 11/28/2017  . Blood in stool 11/28/2017  . Hemorrhoids 11/28/2017  . Cervicalgia 11/28/2017  . Chronic right shoulder pain 11/28/2017  . Back pain 10/29/2017  . Right hip pain 10/29/2017  . Knee pain, right 10/29/2017  . Hypogonadism in male 10/29/2017  . Low testosterone 10/29/2017  . Erectile dysfunction 10/29/2017  . Bipolar 1 disorder ([place]) 10/29/2017     Past Surgical History:  Procedure Laterality Date  . APPENDECTOMY     2006  . COLONOSCOPY WITH PROPOFOL N/A 12/30/2017   Procedure: COLONOSCOPY WITH PROPOFOL;  Surgeon: Lin Landsman, MD;  Location: [facility] ENDOSCOPY;  Service: Gastroenterology;  Laterality: N/A;  . ESOPHAGOGASTRODUODENOSCOPY (EGD) WITH PROPOFOL N/A 12/30/2017   Procedure: ESOPHAGOGASTRODUODENOSCOPY (EGD) WITH PROPOFOL;  Surgeon: Lin Landsman, MD;  Location: [facility] ENDOSCOPY;  Service: Gastroenterology;  Laterality: N/A;    Prior to Admission medications   Medication Sig Start Date End Date Taking? Authorizing Provider  acetaminophen (TYLENOL) 500 MG tablet Take 500 mg by mouth every 6 (six) hours as needed.    [provider]  albuterol (PROVENTIL HFA;VENTOLIN HFA) 108 (90 Base) MCG/ACT inhaler Inhale 2 puffs into the lungs every 6 (six) hours as needed for wheezing or shortness of breath. 08/30/18   Laban Emperor, PA-C  aspirin-acetaminophen-caffeine (EXCEDRIN MIGRAINE) [phone] MG tablet Take by mouth every 6 (six) hours as needed for headache.    [provider]  azithromycin (ZITHROMAX Z-PAK) 250 MG tablet Take 2 tablets (500 mg) on  Day 1,  followed by 1 tablet (250 mg) once daily on Days 2 through 5. 08/30/18   Laban Emperor, PA-C  benzonatate (TESSALON PERLES) 100 MG capsule Take 1 capsule (100 mg total) by mouth 3 (three) times daily as needed for cough. 08/30/18 08/30/19  Laban Emperor, PA-C  brompheniramine-pseudoephedrine-DM 30-2-10 MG/5ML syrup Take  5 mLs by mouth 4 (four) times daily as needed. 08/30/18   Laban Emperor, PA-C  busPIRone (BUSPAR) 30 MG tablet 2 (two) times daily.  09/05/17   [provider]  cariprazine (VRAYLAR) capsule Take 1.5 mg by mouth daily.    [provider]  clomiPHENE (CLOMID) 50 MG tablet Take 1 tablet (50 mg total) by mouth every other day. 11/26/17   McLean-Scocuzza, Nino Glow, MD  clonazePAM Bobbye [place]) 0.5 MG tablet  12/11/17   [provider]  dicyclomine (BENTYL) 10 MG capsule Take 2 capsules (20 mg total) by mouth 4 (four) times daily -  before meals and at bedtime. 05/02/18 06/01/18  Lin Landsman, MD  fluticasone (FLONASE) 50 MCG/ACT nasal spray PLACE 2 SPRAYS INTO EACH NOSTRIL QD 03/29/16   [provider]  lamoTRIgine (LAMICTAL) 200 MG tablet Take by mouth daily.  09/05/17   [provider]  methocarbamol (ROBAXIN) 750 MG tablet Take 1 tablet (750 mg total) by mouth every 8 (eight) hours as needed for muscle spasms. 07/21/18   Gillis Santa, MD  Multiple Vitamins-Minerals (MULTIVITAMIN MEN) TABS Take 1 tablet by mouth daily. 07/22/17   [provider]  mupirocin ointment (BACTROBAN) 2 % Apply 1 application topically 2 (two) times daily. 08/01/18   McLean-Scocuzza, Nino Glow, MD  pantoprazole (PROTONIX) 40 MG tablet Take 1 tablet (40 mg total) by mouth daily. 08/01/18   McLean-Scocuzza, Nino Glow, MD  predniSONE (DELTASONE) 10 MG tablet Take 6 tablets day 1, take 5 tablets day 2, take 4 tablets day 3, take 3 tablets day 4, take 2 tablets day 5, take 1 tablet day 6 08/30/18   Laban Emperor, PA-C  SALINE MIST 0.65 % nasal spray Place 1 application into both nostrils as directed. 01/07/18   [provider]  sildenafil (REVATIO) 20 MG tablet Take 1 tablet (20 mg total) by mouth 2 (two) times daily. Prn 08/04/18   McLean-Scocuzza, Nino Glow, MD  SUMAtriptan (IMITREX) 100 MG tablet May repeat in 2 hours if not helping. Max dose 200 mg in 1 day. Do not take more than 2x per week 08/01/18   McLean-Scocuzza, Nino Glow, MD  traZODone (DESYREL) 100 MG tablet Take 200 mg by mouth at bedtime.    [provider]    Allergies Patient has no known allergies.  Family History  Problem Relation Age of Onset  . Depression Mother   . Alcohol abuse Father   . Cancer Father        prostate dx'ed 9  . COPD Father   . Hyperlipidemia Father   . Hypertension Father     Social History Social History    Tobacco Use  . Smoking status: Current Every Day Smoker    Types: Cigarettes    Last attempt to quit: 2016    Years since quitting: 3.9  . Smokeless tobacco: Never Used  Substance Use Topics  . Alcohol use: No    Frequency: Never    Comment: former quit 03/14/2012  . Drug use: No    Comment: former quit cocaine 03/14/2012      Review of Systems  Constitutional: Positive for fever. Eyes: No visual changes. No discharge. ENT: Positive for congestion and rhinorrhea. Respiratory: Positive for cough Gastrointestinal: No abdominal pain.  No nausea, no vomiting.   Musculoskeletal: Negative for musculoskeletal pain. Skin: Negative for rash, abrasions, lacerations, ecchymosis. Neurological: Negative for headaches.   ____________________________________________   PHYSICAL EXAM:  VITAL SIGNS: ED Triage Vitals  Enc Vitals  Group     BP 08/30/18 0509 (!) 144/78     Pulse Rate 08/30/18 0509 (!) 101     Resp 08/30/18 0509 17     Temp 08/30/18 0509 98.7 F (37.1 C)     Temp Source 08/30/18 0509 Oral     SpO2 08/30/18 0509 98 %     Weight 08/30/18 0508 230 lb (104.3 kg)     Height 08/30/18 0508 5\' 11"  (1.803 m)     Head Circumference --      Peak Flow --      Pain Score 08/30/18 0507 7     Pain Loc --      Pain Edu? --      Excl. in [place]? --      Constitutional: Alert and oriented. Well appearing and in no acute distress. Eyes: Conjunctivae are normal. PERRL. EOMI. No discharge. Head: Atraumatic. ENT: No frontal and maxillary sinus tenderness.      Ears: Tympanic membranes pearly gray with good landmarks. No discharge.      Nose: Mild congestion/rhinnorhea.      Mouth/Throat: Mucous membranes are moist. Oropharynx erythematous. Tonsils not enlarged. No exudates. Uvula midline. Neck: No stridor.   Hematological/Lymphatic/Immunilogical: No cervical lymphadenopathy. Cardiovascular: Normal rate, regular rhythm.  Good peripheral circulation. Respiratory: Normal respiratory  effort without tachypnea or retractions. Lungs CTAB. Good air entry to the bases with no decreased or absent breath sounds. Gastrointestinal: Bowel sounds 4 quadrants. Soft and nontender to palpation. No guarding or rigidity. No palpable masses. No distention. Musculoskeletal: Full range of motion to all extremities. No gross deformities appreciated. Neurologic:  Normal speech and language. No gross focal neurologic deficits are appreciated.  Skin:  Skin is warm, dry and intact. No rash noted. Psychiatric: Mood and affect are normal. Speech and behavior are normal. Patient exhibits appropriate insight and judgement.   ____________________________________________   LABS (all labs ordered are listed, but only abnormal results are displayed)  Labs Reviewed  BASIC METABOLIC PANEL - Abnormal; Notable for the following components:      Result Value   Glucose, Bld 125 (*)    All other components within normal limits  CBC - Abnormal; Notable for the following components:   Hemoglobin 17.2 (*)    All other components within normal limits  TROPONIN I   ____________________________________________  EKG   ____________________________________________  RADIOLOGY Robinette Haines, personally viewed and evaluated these images (plain radiographs) as part of my medical decision making, as well as reviewing the written report by the radiologist.  Dg Chest 2 View  Result Date: 08/30/2018 CLINICAL DATA:  Cough, congestion, shortness of breath. EXAM: CHEST - 2 VIEW COMPARISON:  None. FINDINGS: The cardiomediastinal contours are normal. Minimal bronchial thickening. Pulmonary vasculature is normal. No consolidation, pleural effusion, or pneumothorax. No acute osseous abnormalities are seen. IMPRESSION: Minimal bronchial thickening. Electronically Signed   By: Keith Rake M.D.   On: 08/30/2018 05:40    ____________________________________________    PROCEDURES  Procedure(s) performed:     Procedures    Medications  ipratropium-albuterol (DUONEB) 0.5-2.5 (3) MG/3ML nebulizer solution 3 mL (3 mLs Nebulization Given 08/30/18 0728)     ____________________________________________   INITIAL IMPRESSION / ASSESSMENT AND PLAN / ED COURSE  Pertinent labs & imaging results that were available during my care of the patient were reviewed by me and considered in my medical decision making (see chart for details).  Review of the [place] CSRS was performed in accordance of the [place] prior to  dispensing any controlled drugs.     Patient's diagnosis is consistent with bronchitis. Vital signs and exam are reassuring.  DuoNeb was given.  Patient is requesting to go home.  He is in the room drinking [facility]. Patient feels comfortable going home. Patient will be discharged home with prescriptions for prednisone, azithromycin, Bromfed, albuterol inhaler. Patient is to follow up with primary care as needed or otherwise directed. Patient is given ED precautions to return to the ED for any worsening or new symptoms.     ____________________________________________  FINAL CLINICAL IMPRESSION(S) / ED DIAGNOSES  Final diagnoses:  Bronchitis      NEW MEDICATIONS STARTED DURING THIS VISIT:  ED Discharge Orders         Ordered    predniSONE (DELTASONE) 10 MG tablet     08/30/18 0728    azithromycin (ZITHROMAX Z-PAK) 250 MG tablet     08/30/18 0728    albuterol (PROVENTIL HFA;VENTOLIN HFA) 108 (90 Base) MCG/ACT inhaler  Every 6 hours PRN     08/30/18 0728    brompheniramine-pseudoephedrine-DM 30-2-10 MG/5ML syrup  4 times daily PRN     08/30/18 0728    benzonatate (TESSALON PERLES) 100 MG capsule  3 times daily PRN     08/30/18 0745              This chart was dictated using voice recognition software/Dragon. Despite best efforts to proofread, errors can occur which can change the meaning. Any change was purely unintentional.    Laban Emperor, PA-C 08/30/18  1524    Lisa Roca, MD 09/01/18 1012

## 2018-08-30 NOTE — ED Notes (Signed)
See triage note  States he developed cough and congestion about 5 days ago.  States he feels like he not breathing well  Occasional cough  No fever  States he is having some discomfort in chest with cough and inspiration ..states he has used OTC meds w/o relief

## 2018-11-04 DIAGNOSIS — F314 Bipolar disorder, current episode depressed, severe, without psychotic features: Secondary | ICD-10-CM | POA: Diagnosis not present

## 2018-11-04 DIAGNOSIS — F4312 Post-traumatic stress disorder, chronic: Secondary | ICD-10-CM | POA: Diagnosis not present

## 2018-11-04 DIAGNOSIS — F1020 Alcohol dependence, uncomplicated: Secondary | ICD-10-CM | POA: Diagnosis not present

## 2018-11-05 DIAGNOSIS — F1020 Alcohol dependence, uncomplicated: Secondary | ICD-10-CM | POA: Diagnosis not present

## 2018-11-05 DIAGNOSIS — F4312 Post-traumatic stress disorder, chronic: Secondary | ICD-10-CM | POA: Diagnosis not present

## 2018-11-05 DIAGNOSIS — F314 Bipolar disorder, current episode depressed, severe, without psychotic features: Secondary | ICD-10-CM | POA: Diagnosis not present

## 2018-11-28 DIAGNOSIS — F314 Bipolar disorder, current episode depressed, severe, without psychotic features: Secondary | ICD-10-CM | POA: Diagnosis not present

## 2018-11-28 DIAGNOSIS — F4312 Post-traumatic stress disorder, chronic: Secondary | ICD-10-CM | POA: Diagnosis not present

## 2018-11-28 DIAGNOSIS — F1020 Alcohol dependence, uncomplicated: Secondary | ICD-10-CM | POA: Diagnosis not present

## 2018-12-05 DIAGNOSIS — F314 Bipolar disorder, current episode depressed, severe, without psychotic features: Secondary | ICD-10-CM | POA: Diagnosis not present

## 2018-12-05 DIAGNOSIS — F1020 Alcohol dependence, uncomplicated: Secondary | ICD-10-CM | POA: Diagnosis not present

## 2018-12-05 DIAGNOSIS — F4312 Post-traumatic stress disorder, chronic: Secondary | ICD-10-CM | POA: Diagnosis not present

## 2018-12-09 DIAGNOSIS — R7989 Other specified abnormal findings of blood chemistry: Secondary | ICD-10-CM | POA: Diagnosis not present

## 2018-12-11 DIAGNOSIS — F314 Bipolar disorder, current episode depressed, severe, without psychotic features: Secondary | ICD-10-CM | POA: Diagnosis not present

## 2018-12-11 DIAGNOSIS — F1020 Alcohol dependence, uncomplicated: Secondary | ICD-10-CM | POA: Diagnosis not present

## 2018-12-11 DIAGNOSIS — F4312 Post-traumatic stress disorder, chronic: Secondary | ICD-10-CM | POA: Diagnosis not present

## 2018-12-12 DIAGNOSIS — F4312 Post-traumatic stress disorder, chronic: Secondary | ICD-10-CM | POA: Diagnosis not present

## 2018-12-12 DIAGNOSIS — F314 Bipolar disorder, current episode depressed, severe, without psychotic features: Secondary | ICD-10-CM | POA: Diagnosis not present

## 2018-12-12 DIAGNOSIS — F1020 Alcohol dependence, uncomplicated: Secondary | ICD-10-CM | POA: Diagnosis not present

## 2018-12-29 ENCOUNTER — Other Ambulatory Visit: Payer: Self-pay

## 2018-12-29 DIAGNOSIS — G43809 Other migraine, not intractable, without status migrainosus: Secondary | ICD-10-CM

## 2018-12-29 MED ORDER — SUMATRIPTAN SUCCINATE 100 MG PO TABS: ORAL_TABLET | ORAL | 2 refills | Status: DC

## 2018-12-30 ENCOUNTER — Ambulatory Visit (INDEPENDENT_AMBULATORY_CARE_PROVIDER_SITE_OTHER): Payer: Medicare PPO | Admitting: Internal Medicine

## 2018-12-30 ENCOUNTER — Encounter: Payer: Self-pay | Admitting: Internal Medicine

## 2018-12-30 ENCOUNTER — Other Ambulatory Visit: Payer: Self-pay

## 2018-12-30 DIAGNOSIS — W57XXXA Bitten or stung by nonvenomous insect and other nonvenomous arthropods, initial encounter: Secondary | ICD-10-CM | POA: Diagnosis not present

## 2018-12-30 DIAGNOSIS — T148XXA Other injury of unspecified body region, initial encounter: Secondary | ICD-10-CM | POA: Diagnosis not present

## 2018-12-30 DIAGNOSIS — N529 Male erectile dysfunction, unspecified: Secondary | ICD-10-CM

## 2018-12-30 DIAGNOSIS — G43809 Other migraine, not intractable, without status migrainosus: Secondary | ICD-10-CM | POA: Diagnosis not present

## 2018-12-30 MED ORDER — SUMATRIPTAN SUCCINATE 100 MG PO TABS: ORAL_TABLET | ORAL | 5 refills | Status: DC

## 2018-12-30 MED ORDER — TRIAMCINOLONE ACETONIDE 0.1 % EX CREA: 1.0000 "application " | TOPICAL_CREAM | Freq: Two times a day (BID) | CUTANEOUS | 0 refills | Status: DC

## 2018-12-30 MED ORDER — HYDROXYZINE HCL 25 MG PO TABS: 25.0000 mg | ORAL_TABLET | Freq: Three times a day (TID) | ORAL | 0 refills | Status: DC | PRN

## 2018-12-30 MED ORDER — SILDENAFIL CITRATE 20 MG PO TABS: 40.0000 mg | ORAL_TABLET | Freq: Every day | ORAL | 5 refills | Status: DC

## 2018-12-30 NOTE — Progress Notes (Signed)
Virtual Visit via Video Note  I connected with Johnny Villa on 12/30/18 at 11:00 AM EDT by a video enabled telemedicine application and verified that I am speaking with the correct person using two identifiers.  Location patient: home Location provider:work  Persons participating in the virtual visit: patient, provider  I discussed the limitations of evaluation and management by telemedicine and the availability of in person appointments. The patient expressed understanding and agreed to proceed.   HPI: Sunday 12/28/2018 pt was working outside with wood and c/o seeing ants on wood and c/o bites to wrists b/l, neck, chest, stomach, arms. Tried Benadryl and Prep H max topical for skin. Areas are itching   ED needs refill of Revatio 40 mg qd   He needs refill of imitrex 100 mg prn for migraines    ROS: See pertinent positives and negatives per HPI.  Past Medical History:  Diagnosis Date  . Bipolar disorder ([name])    psychiatrist in [place]  . Blood in stool   . Depression   . Drug abuse in remission ([name])    sober for 6 years  . Fatty liver 01/2018  . H/O alcohol abuse   . Headache    migraines  . Hypertension   . Low testosterone in male     Past Surgical History:  Procedure Laterality Date  . APPENDECTOMY     20 06  . COLONOSCOPY WITH PROPOFOL N/A 12/30/2017   Procedure: COLONOSCOPY WITH PROPOFOL;  Surgeon: Lin Landsman, MD;  Location: [facility] ENDOSCOPY;  Service: Gastroenterology;  Laterality: N/A;  . ESOPHAGOGASTRODUODENOSCOPY (EGD) WITH PROPOFOL N/A 12/30/2017   Procedure: ESOPHAGOGASTRODUODENOSCOPY (EGD) WITH PROPOFOL;  Surgeon: Lin Landsman, MD;  Location: [facility] ENDOSCOPY;  Service: Gastroenterology;  Laterality: N/A;    Family History  Problem Relation Age of Onset  . Depression Mother   . Alcohol abuse Father   . Cancer Father        prostate dx'ed 75  . COPD Father   . Hyperlipidemia Father   . Hypertension Father     SOCIAL HX: unemployed     Current Outpatient Medications:  .  acetaminophen (TYLENOL) 500 MG tablet, Take 500 mg by mouth every 6 (six) hours as needed., Disp: , Rfl:  .  albuterol (PROVENTIL HFA;VENTOLIN HFA) 108 (90 Base) MCG/ACT inhaler, Inhale 2 puffs into the lungs every 6 (six) hours as needed for wheezing or shortness of breath., Disp: 1 Inhaler, Rfl: 0 .  aspirin-acetaminophen-caffeine (EXCEDRIN MIGRAINE) 250-250-65 MG tablet, Take by mouth every 6 (six) hours as needed for headache., Disp: , Rfl:  .  azithromycin (ZITHROMAX Z-PAK) 250 MG tablet, Take 2 tablets (500 mg) on  Day 1,  followed by 1 tablet (250 mg) once daily on Days 2 through 5., Disp: 6 each, Rfl: 0 .  benzonatate (TESSALON PERLES) 100 MG capsule, Take 1 capsule (100 mg total) by mouth 3 (three) times daily as needed for cough., Disp: 30 capsule, Rfl: 0 .  brompheniramine-pseudoephedrine-DM 30-2-10 MG/5ML syrup, Take 5 mLs by mouth 4 (four) times daily as needed., Disp: 120 mL, Rfl: 0 .  busPIRone (BUSPAR) 30 MG tablet, 2 (two) times daily. , Disp: , Rfl:  .  cariprazine (VRAYLAR) capsule, Take 1.5 mg by mouth daily., Disp: , Rfl:  .  clomiPHENE (CLOMID) 50 MG tablet, Take 1 tablet (50 mg total) by mouth every other day., Disp: 60 tablet, Rfl: 0 .  clonazePAM (KLONOPIN) 0.5 MG tablet, , Disp: , Rfl:  .  dicyclomine (BENTYL)  10 MG capsule, Take 2 capsules (20 mg total) by mouth 4 (four) times daily -  before meals and at bedtime., Disp: 240 capsule, Rfl: 0 .  fluticasone (FLONASE) 50 MCG/ACT nasal spray, PLACE 2 SPRAYS INTO EACH NOSTRIL QD, Disp: , Rfl:  .  hydrOXYzine (ATARAX/VISTARIL) 25 MG tablet, Take 1 tablet (25 mg total) by mouth 3 (three) times daily as needed for itching., Disp: 40 tablet, Rfl: 0 .  lamoTRIgine (LAMICTAL) 200 MG tablet, Take by mouth daily. , Disp: , Rfl:  .  methocarbamol (ROBAXIN) 750 MG tablet, Take 1 tablet (750 mg total) by mouth every 8 (eight) hours as needed for muscle spasms., Disp: 90 tablet, Rfl: 3 .  Multiple  Vitamins-Minerals (MULTIVITAMIN MEN) TABS, Take 1 tablet by mouth daily., Disp: , Rfl:  .  mupirocin ointment (BACTROBAN) 2 %, Apply 1 application topically 2 (two) times daily., Disp: 30 g, Rfl: 0 .  pantoprazole (PROTONIX) 40 MG tablet, Take 1 tablet (40 mg total) by mouth daily., Disp: 90 tablet, Rfl: 3 .  SALINE MIST 0.65 % nasal spray, Place 1 application into both nostrils as directed., Disp: , Rfl:  .  sildenafil (REVATIO) 20 MG tablet, Take 2 tablets (40 mg total) by mouth daily. Prn, Disp: 60 tablet, Rfl: 5 .  SUMAtriptan (IMITREX) 100 MG tablet, May repeat in 2 hours if not helping. Max dose 200 mg in 1 day. Do not take more than 2x per week, Disp: 10 tablet, Rfl: 5 .  traZODone (DESYREL) 100 MG tablet, Take 200 mg by mouth at bedtime., Disp: , Rfl:  .  triamcinolone cream (KENALOG) 0.1 %, Apply 1 application topically 2 (two) times daily. To bug bites not face, underarms or private area, Disp: 454 g, Rfl: 0  EXAM:  VITALS per patient if applicable:  GENERAL: alert, oriented, appears well and in no acute distress  HEENT: atraumatic, conjunttiva clear, no obvious abnormalities on inspection of external nose and ears  NECK: normal movements of the head and neck  LUNGS: on inspection no signs of respiratory distress, breathing rate appears normal, no obvious gross SOB, gasping or wheezing  CV: no obvious cyanosis  MS: moves all visible extremities without noticeable abnormality  PSYCH/NEURO: pleasant and cooperative, no obvious depression or anxiety, speech and thought processing grossly intact  SKIN: multiple bites to neck, wrists, trunk, abdomen which are itchy  ASSESSMENT AND PLAN:  Discussed the following assessment and plan:  Bug bite, initial encounter to trunk, arms, neck, abdomen - Plan: hydrOXYzine (ATARAX/VISTARIL) 25 MG tablet, triamcinolone cream (KENALOG) 0.1 % -message in 2 days if not better  -prn Sarna lotion  -will not Rx prednisone orally due to bipolar  history   Erectile dysfunction, unspecified erectile dysfunction type - Plan: sildenafil (REVATIO) 20 MG tablet  Other migraine without status migrainosus, not intractable - Plan: SUMAtriptan (IMITREX) 100 MG tablet     I discussed the assessment and treatment plan with the patient. The patient was provided an opportunity to ask questions and all were answered. The patient agreed with the plan and demonstrated an understanding of the instructions.   The patient was advised to call back or seek an in-person evaluation if the symptoms worsen or if the condition fails to improve as anticipated.  I provided 10 minutes of non-face-to-face time during this encounter.   Nino Glow McLean-Scocuzza, MD

## 2018-12-30 NOTE — Patient Instructions (Signed)
Insect Bite, Adult An insect bite can make your skin red, itchy, and swollen. An insect bite is different from an insect sting, which happens when an insect injects poison (venom) into the skin. Some insects can spread disease to people through a bite. However, most insect bites do not lead to disease and are not serious. What are the causes? Insects may bite for a variety of reasons, including:  Hunger.  To defend themselves. Insects that bite include:  Spiders.  Mosquitoes.  Ticks.  Fleas.  Ants.  Flies.  Kissing bugs.  Chiggers. What are the signs or symptoms? Symptoms of this condition include:  Itching or pain in the bite area.  Redness and swelling in the bite area.  An open wound (skin ulcer). In many cases, symptoms last for 2-4 days. In rare cases, a person may have a severe allergic reaction (anaphylactic reaction) to a bite. Symptoms of an anaphylactic reaction may include:  Feeling warm in the face (flushed). This may include redness.  Itchy, red, swollen areas of skin (hives).  Swelling of the eyes, lips, face, mouth, tongue, or throat.  Difficulty breathing, speaking, or swallowing.  Noisy breathing (wheezing).  Dizziness or light-headedness.  Fainting.  Pain or cramping in the abdomen.  Vomiting.  Diarrhea. How is this diagnosed? This condition is usually diagnosed based on symptoms and a physical exam. How is this treated? Treatment is usually not needed. Symptoms often go away on their own. When treatment is recommended, it may involve:  Applying a cream or lotion to the bite area. This treatment helps with itching.  Taking an antibiotic medicine. This treatment is needed if the bite area gets infected.  Getting a tetanus shot, if you are not up to date on this vaccine.  Applying ice to the affected area.  Allergy medicines called antihistamines. This treatment may be needed if you develop itching or an allergic reaction to the  insect bite.  Giving yourself an epinephrine injection if you have an anaphylactic reaction to a bite. To give the injection, you will use what is commonly called an auto-injector "pen" (pre-filled automatic epinephrine injection device). Your health care provider will teach you how to use an auto-injector pen. Follow these instructions at home: Bite area care   Do not scratch the bite area.  Keep the bite area clean and dry. Wash it every day with soap and water as told by your health care provider.  Check the bite area every day for signs of infection. Check for: ? Redness, swelling, or pain. ? Fluid or blood. ? Warmth. ? Pus or a bad smell. Managing pain, itching, and swelling   You may apply cortisone cream, calamine lotion, or a paste made of baking soda and water to the bite area as told by your health care provider.  If directed, put ice on the bite area. ? Put ice in a plastic bag. ? Place a towel between your skin and the bag. ? Leave the ice on for 20 minutes, 2-3 times a day. General instructions  Apply or take over-the-counter and prescription medicines only as told by your health care provider.  If you were prescribed an antibiotic medicine, take or apply it as told by your health care provider. Do not stop using the antibiotic even if your condition improves.  Keep all follow-up visits as told by your health care provider. This is important. How is this prevented? To help reduce your risk of insect bites:  When you are outdoors,   is important.  How is this prevented?  To help reduce your risk of insect bites:  When you are outdoors, wear clothing that covers your arms and legs. This is especially important in the early morning and evening.  Use insect repellent. The best insect repellents contain DEET, picaridin, oil of lemon eucalyptus (OLE), or IR3535.  Consider spraying your clothing with a pesticide called permethrin. Permethrin helps prevent insect bites. It works for several weeks and for up to 5-6 clothing washes. Do not apply permethrin directly to the skin.  If your home windows do not have screens, consider installing them.  If you will be sleeping in  an area where there are mosquitoes, consider covering your sleeping area with a mosquito net.  Contact a health care provider if:  You have redness, swelling, or pain in the bite area.  You have fluid or blood coming from the bite area.  The bite area feels warm to the touch.  You have pus or a bad smell coming from the bite area.  You have a fever.  Get help right away if:  You have joint pain.  You have a rash.  You feel unusually tired or sleepy.  You have neck pain.  You have a headache.  You have unusual weakness.  You develop symptoms of an anaphylactic reaction. These may include:  Flushed skin.  Hives.  Swelling of the eyes, lips, face, mouth, tongue, or throat.  Difficulty breathing, speaking, or swallowing.  Wheezing.  Dizziness or light-headedness.  Fainting.  Pain or cramping in the abdomen.  Vomiting.  Diarrhea.  These symptoms may represent a serious problem that is an emergency. Do not wait to see if the symptoms will go away. Do the following right away:  Use the auto-injector pen as you have been instructed.  Get medical help. Call your local emergency services (911 in the [place] Do not drive yourself to the hospital.  Summary  An insect bite can make your skin red, itchy, and swollen.  Treatment is usually not needed. Symptoms often go away on their own. When treatment is recommended, it may involve taking medicine, applying medicine to the area, or applying ice.  Apply or take over-the-counter and prescription medicines only as told by your health care provider.  Use insect repellent to help prevent insect bites.  Contact a health care provider if you have any signs of infection in the bite area.  This information is not intended to replace advice given to you by your health care provider. Make sure you discuss any questions you have with your health care provider.  Document Released: 10/25/2004 Document Revised: 03/28/2018 Document Reviewed: 03/28/2018  Elsevier Interactive Patient Education   2019 [employer]

## 2019-01-01 DIAGNOSIS — F4312 Post-traumatic stress disorder, chronic: Secondary | ICD-10-CM | POA: Diagnosis not present

## 2019-01-01 DIAGNOSIS — F314 Bipolar disorder, current episode depressed, severe, without psychotic features: Secondary | ICD-10-CM | POA: Diagnosis not present

## 2019-01-01 DIAGNOSIS — F1020 Alcohol dependence, uncomplicated: Secondary | ICD-10-CM | POA: Diagnosis not present

## 2019-01-07 ENCOUNTER — Telehealth: Payer: Self-pay | Admitting: Internal Medicine

## 2019-01-07 NOTE — Telephone Encounter (Signed)
sch f/u 08/31/2019 or after   Thanks [place]

## 2019-01-09 DIAGNOSIS — F1020 Alcohol dependence, uncomplicated: Secondary | ICD-10-CM | POA: Diagnosis not present

## 2019-01-09 DIAGNOSIS — F314 Bipolar disorder, current episode depressed, severe, without psychotic features: Secondary | ICD-10-CM | POA: Diagnosis not present

## 2019-01-09 DIAGNOSIS — F4312 Post-traumatic stress disorder, chronic: Secondary | ICD-10-CM | POA: Diagnosis not present

## 2019-01-20 NOTE — Telephone Encounter (Signed)
Pt has been scheduled. Thank you.

## 2019-02-06 DIAGNOSIS — F314 Bipolar disorder, current episode depressed, severe, without psychotic features: Secondary | ICD-10-CM | POA: Diagnosis not present

## 2019-02-06 DIAGNOSIS — F1020 Alcohol dependence, uncomplicated: Secondary | ICD-10-CM | POA: Diagnosis not present

## 2019-02-06 DIAGNOSIS — F4312 Post-traumatic stress disorder, chronic: Secondary | ICD-10-CM | POA: Diagnosis not present

## 2019-02-25 ENCOUNTER — Other Ambulatory Visit: Payer: Self-pay

## 2019-02-25 ENCOUNTER — Ambulatory Visit (INDEPENDENT_AMBULATORY_CARE_PROVIDER_SITE_OTHER): Payer: Medicare PPO | Admitting: Internal Medicine

## 2019-02-25 DIAGNOSIS — G43809 Other migraine, not intractable, without status migrainosus: Secondary | ICD-10-CM | POA: Diagnosis not present

## 2019-02-25 DIAGNOSIS — M25561 Pain in right knee: Secondary | ICD-10-CM | POA: Diagnosis not present

## 2019-02-25 DIAGNOSIS — R82998 Other abnormal findings in urine: Secondary | ICD-10-CM

## 2019-02-25 DIAGNOSIS — M6282 Rhabdomyolysis: Secondary | ICD-10-CM

## 2019-02-25 DIAGNOSIS — G8929 Other chronic pain: Secondary | ICD-10-CM

## 2019-02-25 MED ORDER — SUMATRIPTAN SUCCINATE 100 MG PO TABS: ORAL_TABLET | ORAL | 5 refills | Status: DC

## 2019-02-25 NOTE — Progress Notes (Addendum)
Tele Note failed [place]  I connected with Johnny Villa  on 02/25/19 at 10:12 AM EDT by a telephone and verified that I am speaking with the correct person using two identifiers.  Location patient: home Location provider:work o Persons participating in the virtual visit: patient, provider  I discussed the limitations of evaluation and management by telemedicine and the availability of in person appointments. The patient expressed understanding and agreed to proceed.   HPI: 1. Chronic right knee pain x 15-20 years s/p injury pain worse with exercise or the day after running pain is over knee cap and he almost fell due to leg weakness. He tried knee brace, straps, walking instead of running, ice, epsolm sat soaks with some relief. Knee pain is worse with impact yesterday 5/26 was 7/10 reduced to 2-3/10. He also tried lidoderm topically  -he is established with [facility] but declines steroid injection for now wants to see MRI   ROS: See pertinent positives and negatives per HPI.  Past Medical History:  Diagnosis Date  . Bipolar disorder [facility])    psychiatrist in [place]  . Blood in stool   . Depression   . Drug abuse in remission [facility])    sober for 6 years  . Fatty liver 01/2018  . H/O alcohol abuse   . Headache    migraines  . Hypertension   . Low testosterone in male     Past Surgical History:  Procedure Laterality Date  . APPENDECTOMY     2006  . COLONOSCOPY WITH PROPOFOL N/A 12/30/2017   Procedure: COLONOSCOPY WITH PROPOFOL;  Surgeon: Lin Landsman, MD;  Location: [facility] ENDOSCOPY;  Service: Gastroenterology;  Laterality: N/A;  . ESOPHAGOGASTRODUODENOSCOPY (EGD) WITH PROPOFOL N/A 12/30/2017   Procedure: ESOPHAGOGASTRODUODENOSCOPY (EGD) WITH PROPOFOL;  Surgeon: Lin Landsman, MD;  Location: [facility] ENDOSCOPY;  Service: Gastroenterology;  Laterality: N/A;    Family History  Problem Relation Age of Onset  . Depression Mother   . Alcohol abuse Father   . Cancer Father       prostate dx'ed 70  . COPD Father   . Hyperlipidemia Father   . Hypertension Father     SOCIAL HX: lives alone    Current Outpatient Medications:  .  acetaminophen (TYLENOL) 500 MG tablet, Take 500 mg by mouth every 6 (six) hours as needed., Disp: , Rfl:  .  albuterol (PROVENTIL HFA;VENTOLIN HFA) 108 (90 Base) MCG/ACT inhaler, Inhale 2 puffs into the lungs every 6 (six) hours as needed for wheezing or shortness of breath., Disp: 1 Inhaler, Rfl: 0 .  aspirin-acetaminophen-caffeine (EXCEDRIN MIGRAINE) 250-250-65 MG tablet, Take by mouth every 6 (six) hours as needed for headache., Disp: , Rfl:  .  azithromycin (ZITHROMAX Z-PAK) 250 MG tablet, Take 2 tablets (500 mg) on  Day 1,  followed by 1 tablet (250 mg) once daily on Days 2 through 5., Disp: 6 each, Rfl: 0 .  benzonatate (TESSALON PERLES) 100 MG capsule, Take 1 capsule (100 mg total) by mouth 3 (three) times daily as needed for cough., Disp: 30 capsule, Rfl: 0 .  brompheniramine-pseudoephedrine-DM 30-2-10 MG/5ML syrup, Take 5 mLs by mouth 4 (four) times daily as needed., Disp: 120 mL, Rfl: 0 .  busPIRone (BUSPAR) 30 MG tablet, 2 (two) times daily. , Disp: , Rfl:  .  cariprazine (VRAYLAR) capsule, Take 1.5 mg by mouth daily., Disp: , Rfl:  .  clomiPHENE (CLOMID) 50 MG tablet, Take 1 tablet (50 mg total) by mouth every other day., Disp:  60 tablet, Rfl: 0 .  clonazePAM (KLONOPIN) 0.5 MG tablet, , Disp: , Rfl:  .  dicyclomine (BENTYL) 10 MG capsule, Take 2 capsules (20 mg total) by mouth 4 (four) times daily -  before meals and at bedtime., Disp: 240 capsule, Rfl: 0 .  fluticasone (FLONASE) 50 MCG/ACT nasal spray, PLACE 2 SPRAYS INTO EACH NOSTRIL QD, Disp: , Rfl:  .  hydrOXYzine (ATARAX/VISTARIL) 25 MG tablet, Take 1 tablet (25 mg total) by mouth 3 (three) times daily as needed for itching., Disp: 40 tablet, Rfl: 0 .  lamoTRIgine (LAMICTAL) 200 MG tablet, Take by mouth daily. , Disp: , Rfl:  .  methocarbamol (ROBAXIN) 750 MG tablet, Take 1  tablet (750 mg total) by mouth every 8 (eight) hours as needed for muscle spasms., Disp: 90 tablet, Rfl: 3 .  Multiple Vitamins-Minerals (MULTIVITAMIN MEN) TABS, Take 1 tablet by mouth daily., Disp: , Rfl:  .  mupirocin ointment (BACTROBAN) 2 %, Apply 1 application topically 2 (two) times daily., Disp: 30 g, Rfl: 0 .  pantoprazole (PROTONIX) 40 MG tablet, Take 1 tablet (40 mg total) by mouth daily., Disp: 90 tablet, Rfl: 3 .  SALINE MIST 0.65 % nasal spray, Place 1 application into both nostrils as directed., Disp: , Rfl:  .  sildenafil (REVATIO) 20 MG tablet, Take 2 tablets (40 mg total) by mouth daily. Prn, Disp: 60 tablet, Rfl: 5 .  SUMAtriptan (IMITREX) 100 MG tablet, May repeat in 2 hours if not helping. Max dose 200 mg in 1 day. Do not take more than 2x per week, Disp: 9 tablet, Rfl: 5 .  traZODone (DESYREL) 100 MG tablet, Take 200 mg by mouth at bedtime., Disp: , Rfl:  .  triamcinolone cream (KENALOG) 0.1 %, Apply 1 application topically 2 (two) times daily. To bug bites not face, underarms or private area, Disp: 454 g, Rfl: 0  EXAM:  VITALS per patient if applicable:  GENERAL: alert, oriented, appears well and in no acute distress  PSYCH/NEURO: pleasant and cooperative, no obvious depression or anxiety, speech and thought processing grossly intact  ASSESSMENT AND PLAN:  Discussed the following assessment and plan:  Chronic pain of right knee with knee weakness Xray 10/24/17 normal pain persists with activity I.e running - Plan: MR Knee Right Wo Contrast -prn Tylenol  -disc can try otc tumeric 500 mg daily up to max 2000 but reviewed side effects I.e GI upset if dose increased  -consider [facility] in future if abnormal mri right knee   Other migraine without status migrainosus, not intractable - Plan: SUMAtriptan (IMITREX) 100 MG tablet sent for #9 pills insurance will not cover 10 pills   HM  Flu shot utd Tdap utd  Declines MMR lab check  HIV neg 05/24/17  See labs 06/2018  repeat labs after 06/2019 in future cmet, cbc, lipid, tsh, ua, vitamin D, lamictal   C/w rhabdo 03/16/2019 [facility] labs  I discussed the assessment and treatment plan with the patient. The patient was provided an opportunity to ask questions and all were answered. The patient agreed with the plan and demonstrated an understanding of the instructions.   The patient was advised to call back or seek an in-person evaluation if the symptoms worsen or if the condition fails to improve as anticipated.  Time spent 15 min Delorise Jackson, MD

## 2019-03-13 ENCOUNTER — Encounter: Payer: Self-pay | Admitting: Internal Medicine

## 2019-03-16 ENCOUNTER — Telehealth: Payer: Self-pay | Admitting: Internal Medicine

## 2019-03-16 ENCOUNTER — Other Ambulatory Visit: Payer: Self-pay | Admitting: Internal Medicine

## 2019-03-16 ENCOUNTER — Ambulatory Visit: Payer: Medicare PPO

## 2019-03-16 DIAGNOSIS — F4312 Post-traumatic stress disorder, chronic: Secondary | ICD-10-CM | POA: Diagnosis not present

## 2019-03-16 DIAGNOSIS — F314 Bipolar disorder, current episode depressed, severe, without psychotic features: Secondary | ICD-10-CM | POA: Diagnosis not present

## 2019-03-16 DIAGNOSIS — F1020 Alcohol dependence, uncomplicated: Secondary | ICD-10-CM | POA: Diagnosis not present

## 2019-03-16 NOTE — Telephone Encounter (Signed)
Please call pt to set up appt tomorrow 03/17/2019 via video Also advise him to go to [facility] to get urine labs and blood work today    Thanks [employer]

## 2019-03-16 NOTE — Addendum Note (Signed)
Addended by: Orland Mustard on: 03/16/2019 03:34 PM   Modules accepted: Orders

## 2019-03-17 NOTE — Telephone Encounter (Signed)
Pt called this morning. Pt made appt on 06/17 at [place] and virtual appt for 06/18. Thanks

## 2019-03-17 NOTE — Telephone Encounter (Signed)
Left message for patient to return call back. [facility] may give and obtain information.  

## 2019-03-18 DIAGNOSIS — R82998 Other abnormal findings in urine: Secondary | ICD-10-CM | POA: Diagnosis not present

## 2019-03-18 DIAGNOSIS — F4312 Post-traumatic stress disorder, chronic: Secondary | ICD-10-CM | POA: Diagnosis not present

## 2019-03-18 DIAGNOSIS — M25561 Pain in right knee: Secondary | ICD-10-CM | POA: Diagnosis not present

## 2019-03-18 DIAGNOSIS — F314 Bipolar disorder, current episode depressed, severe, without psychotic features: Secondary | ICD-10-CM | POA: Diagnosis not present

## 2019-03-18 DIAGNOSIS — G8929 Other chronic pain: Secondary | ICD-10-CM | POA: Diagnosis not present

## 2019-03-18 DIAGNOSIS — M6282 Rhabdomyolysis: Secondary | ICD-10-CM | POA: Diagnosis not present

## 2019-03-18 DIAGNOSIS — F1020 Alcohol dependence, uncomplicated: Secondary | ICD-10-CM | POA: Diagnosis not present

## 2019-03-19 ENCOUNTER — Other Ambulatory Visit: Payer: Self-pay | Admitting: Internal Medicine

## 2019-03-19 DIAGNOSIS — F319 Bipolar disorder, unspecified: Secondary | ICD-10-CM

## 2019-03-20 ENCOUNTER — Ambulatory Visit (INDEPENDENT_AMBULATORY_CARE_PROVIDER_SITE_OTHER): Payer: Medicare PPO | Admitting: Internal Medicine

## 2019-03-20 ENCOUNTER — Other Ambulatory Visit: Payer: Self-pay

## 2019-03-20 DIAGNOSIS — F319 Bipolar disorder, unspecified: Secondary | ICD-10-CM

## 2019-03-20 DIAGNOSIS — K219 Gastro-esophageal reflux disease without esophagitis: Secondary | ICD-10-CM

## 2019-03-20 DIAGNOSIS — R82998 Other abnormal findings in urine: Secondary | ICD-10-CM | POA: Insufficient documentation

## 2019-03-20 LAB — COMPREHENSIVE METABOLIC PANEL
ALT: 32 IU/L (ref 0–44)
AST: 32 IU/L (ref 0–40)
Albumin/Globulin Ratio: 1.7 (ref 1.2–2.2)
Albumin: 4.6 g/dL (ref 4.0–5.0)
Alkaline Phosphatase: 52 IU/L (ref 39–117)
BUN/Creatinine Ratio: 14 (ref 9–20)
BUN: 15 mg/dL (ref 6–24)
Bilirubin Total: 0.6 mg/dL (ref 0.0–1.2)
CO2: 21 mmol/L (ref 20–29)
Calcium: 9.2 mg/dL (ref 8.7–10.2)
Chloride: 103 mmol/L (ref 96–106)
Creatinine, Ser: 1.08 mg/dL (ref 0.76–1.27)
GFR calc Af Amer: 98 mL/min/{1.73_m2} (ref >59)
GFR calc non Af Amer: 85 mL/min/{1.73_m2} (ref >59)
Globulin, Total: 2.7 g/dL (ref 1.5–4.5)
Glucose: 93 mg/dL (ref 65–99)
Potassium: 5 mmol/L (ref 3.5–5.2)
Sodium: 137 mmol/L (ref 134–144)
Total Protein: 7.3 g/dL (ref 6.0–8.5)

## 2019-03-20 LAB — URINALYSIS, ROUTINE W REFLEX MICROSCOPIC
Bilirubin, UA: NEGATIVE
Glucose, UA: NEGATIVE
Ketones, UA: NEGATIVE
Leukocytes,UA: NEGATIVE
Nitrite, UA: NEGATIVE
Protein,UA: NEGATIVE
RBC, UA: NEGATIVE
Specific Gravity, UA: 1.024 (ref 1.005–1.030)
Urobilinogen, Ur: 0.2 mg/dL (ref 0.2–1.0)
pH, UA: 5.5 (ref 5.0–7.5)

## 2019-03-20 LAB — LACTATE DEHYDROGENASE: LDH: 205 IU/L (ref 121–224)

## 2019-03-20 LAB — CBC WITH DIFFERENTIAL/PLATELET
Basophils Absolute: 0 10*3/uL (ref 0.0–0.2)
Basos: 1 %
EOS (ABSOLUTE): 0.1 10*3/uL (ref 0.0–0.4)
Eos: 1 %
Hematocrit: 43.2 % (ref 37.5–51.0)
Hemoglobin: 14.3 g/dL (ref 13.0–17.7)
Immature Grans (Abs): 0 10*3/uL (ref 0.0–0.1)
Immature Granulocytes: 0 %
Lymphocytes Absolute: 1.5 10*3/uL (ref 0.7–3.1)
Lymphs: 27 %
MCH: 31.8 pg (ref 26.6–33.0)
MCHC: 33.1 g/dL (ref 31.5–35.7)
MCV: 96 fL (ref 79–97)
Monocytes Absolute: 0.5 10*3/uL (ref 0.1–0.9)
Monocytes: 9 %
Neutrophils Absolute: 3.6 10*3/uL (ref 1.4–7.0)
Neutrophils: 62 %
Platelets: 158 10*3/uL (ref 150–450)
RBC: 4.49 x10E6/uL (ref 4.14–5.80)
RDW: 12.7 % (ref 11.6–15.4)
WBC: 5.7 10*3/uL (ref 3.4–10.8)

## 2019-03-20 LAB — MYOGLOBIN, URINE: Myoglobin, Ur: 2 ng/mL (ref 0–13)

## 2019-03-20 LAB — PHOSPHORUS: Phosphorus: 3.1 mg/dL (ref 2.8–4.1)

## 2019-03-20 LAB — CK: Total CK: 377 U/L (ref 49–439)

## 2019-03-20 LAB — URIC ACID: Uric Acid: 4 mg/dL (ref 3.7–8.6)

## 2019-03-20 NOTE — Progress Notes (Signed)
Virtual Visit via Video Note  I connected with Johnny Villa  on 03/20/19 at  945 :AM EDT by a video enabled telemedicine application and verified that I am speaking with the correct person using two identifiers.  Location patient: home Location provider:work Persons participating in the virtual visit: patient, provider  I discussed the limitations of evaluation and management by telemedicine and the availability of in person appointments. The patient expressed understanding and agreed to proceed.   HPI: 1. Bipolar 1 needs referral to psychiatry locally he wants a change due to drive to [facility] is > 1 hr  2. Last week orange red urine but has not had x 3-4 days he took an antioxidant red pill and had increased consumption of this and also he takes muscle builder otc. He does not remember if he had beets. He denies muscle aches or pains but was concerned due to h/o fatty liver  3. GERD controlled on protonix 40 mg qd reviewed long term side effects with the patient    ROS: See pertinent positives and negatives per HPI.  Past Medical History:  Diagnosis Date  . Bipolar disorder [facility])    psychiatrist in [place]  . Blood in stool   . Depression   . Drug abuse in remission [facility])    sober for 6 years  . Fatty liver 01/2018  . H/O alcohol abuse   . Headache    migraines  . Hypertension   . Low testosterone in male     Past Surgical History:  Procedure Laterality Date  . APPENDECTOMY     2006  . COLONOSCOPY WITH PROPOFOL N/A 12/30/2017   Procedure: COLONOSCOPY WITH PROPOFOL;  Surgeon: Lin Landsman, MD;  Location: [facility] ENDOSCOPY;  Service: Gastroenterology;  Laterality: N/A;  . ESOPHAGOGASTRODUODENOSCOPY (EGD) WITH PROPOFOL N/A 12/30/2017   Procedure: ESOPHAGOGASTRODUODENOSCOPY (EGD) WITH PROPOFOL;  Surgeon: Lin Landsman, MD;  [facility] ENDOSCOPY;  Service: Gastroenterology;  Laterality: N/A;    Family History  Problem Relation Age of Onset  . Depression Mother    . Alcohol abuse Father   . Cancer Father        prostate dx'ed 86  . COPD Father   . Hyperlipidemia Father   . Hypertension Father     SOCIAL HX:  Lives alone    Current Outpatient Medications:  .  acetaminophen (TYLENOL) 500 MG tablet, Take 500 mg by mouth every 6 (six) hours as needed., Disp: , Rfl:  .  albuterol (PROVENTIL HFA;VENTOLIN HFA) 108 (90 Base) MCG/ACT inhaler, Inhale 2 puffs into the lungs every 6 (six) hours as needed for wheezing or shortness of breath., Disp: 1 Inhaler, Rfl: 0 .  aspirin-acetaminophen-caffeine (EXCEDRIN MIGRAINE) 250-250-65 MG tablet, Take by mouth every 6 (six) hours as needed for headache., Disp: , Rfl:  .  azithromycin (ZITHROMAX Z-PAK) 250 MG tablet, Take 2 tablets (500 mg) on  Day 1,  followed by 1 tablet (250 mg) once daily on Days 2 through 5., Disp: 6 each, Rfl: 0 .  benzonatate (TESSALON PERLES) 100 MG capsule, Take 1 capsule (100 mg total) by mouth 3 (three) times daily as needed for cough., Disp: 30 capsule, Rfl: 0 .  brompheniramine-pseudoephedrine-DM 30-2-10 MG/5ML syrup, Take 5 mLs by mouth 4 (four) times daily as needed., Disp: 120 mL, Rfl: 0 .  busPIRone (BUSPAR) 30 MG tablet, 2 (two) times daily. , Disp: , Rfl:  .  cariprazine (VRAYLAR) capsule, Take 1.5 mg by mouth daily., Disp: , Rfl:  .  clomiPHENE (CLOMID) 50 MG tablet, Take 1 tablet (50 mg total) by mouth every other day., Disp: 60 tablet, Rfl: 0 .  clonazePAM (KLONOPIN) 0.5 MG tablet, , Disp: , Rfl:  .  dicyclomine (BENTYL) 10 MG capsule, Take 2 capsules (20 mg total) by mouth 4 (four) times daily -  before meals and at bedtime., Disp: 240 capsule, Rfl: 0 .  fluticasone (FLONASE) 50 MCG/ACT nasal spray, PLACE 2 SPRAYS INTO EACH NOSTRIL QD, Disp: , Rfl:  .  hydrOXYzine (ATARAX/VISTARIL) 25 MG tablet, Take 1 tablet (25 mg total) by mouth 3 (three) times daily as needed for itching., Disp: 40 tablet, Rfl: 0 .  lamoTRIgine (LAMICTAL) 200 MG tablet, Take by mouth daily. , Disp: , Rfl:   .  methocarbamol (ROBAXIN) 750 MG tablet, Take 1 tablet (750 mg total) by mouth every 8 (eight) hours as needed for muscle spasms., Disp: 90 tablet, Rfl: 3 .  Multiple Vitamins-Minerals (MULTIVITAMIN MEN) TABS, Take 1 tablet by mouth daily., Disp: , Rfl:  .  mupirocin ointment (BACTROBAN) 2 %, Apply 1 application topically 2 (two) times daily., Disp: 30 g, Rfl: 0 .  pantoprazole (PROTONIX) 40 MG tablet, Take 1 tablet (40 mg total) by mouth daily., Disp: 90 tablet, Rfl: 3 .  SALINE MIST 0.65 % nasal spray, Place 1 application into both nostrils as directed., Disp: , Rfl:  .  sildenafil (REVATIO) 20 MG tablet, Take 2 tablets (40 mg total) by mouth daily. Prn, Disp: 60 tablet, Rfl: 5 .  SUMAtriptan (IMITREX) 100 MG tablet, May repeat in 2 hours if not helping. Max dose 200 mg in 1 day. Do not take more than 2x per week, Disp: 9 tablet, Rfl: 5 .  traZODone (DESYREL) 100 MG tablet, Take 200 mg by mouth at bedtime., Disp: , Rfl:  .  triamcinolone cream (KENALOG) 0.1 %, Apply 1 application topically 2 (two) times daily. To bug bites not face, underarms or private area, Disp: 454 g, Rfl: 0  EXAM:  VITALS per patient if applicable:  GENERAL: alert, oriented, appears well and in no acute distress  HEENT: atraumatic, conjunttiva clear, no obvious abnormalities on inspection of external nose and ears  NECK: normal movements of the head and neck  LUNGS: on inspection no signs of respiratory distress, breathing rate appears normal, no obvious gross SOB, gasping or wheezing  CV: no obvious cyanosis  MS: moves all visible extremities without noticeable abnormality  PSYCH/NEURO: pleasant and cooperative, no obvious depression or anxiety, speech and thought processing grossly intact  ASSESSMENT AND PLAN:  Discussed the following assessment and plan:  Orange-colored urine - Plan: reviewed labs no indicate for rhabdo advise pt to use caution with otc protein/muscle mass supplements and other otc  supplements could have been diet or red antioxidant pill   Bipolar 1 disorder ([place]) - Plan: referral to [facility] pending   GERD controlled on protonix 40 mg qd consider reduce in future taking 1 hr before breakfast     I discussed the assessment and treatment plan with the patient. The patient was provided an opportunity to ask questions and all were answered. The patient agreed with the plan and demonstrated an understanding of the instructions.   The patient was advised to call back or seek an in-person evaluation if the symptoms worsen or if the condition fails to improve as anticipated.  Time spent 15 minutes  Delorise Jackson, MD

## 2019-04-28 DIAGNOSIS — R7989 Other specified abnormal findings of blood chemistry: Secondary | ICD-10-CM | POA: Diagnosis not present

## 2019-04-29 ENCOUNTER — Encounter: Payer: Self-pay | Admitting: Internal Medicine

## 2019-05-04 ENCOUNTER — Ambulatory Visit (INDEPENDENT_AMBULATORY_CARE_PROVIDER_SITE_OTHER): Payer: Medicare PPO | Admitting: Psychiatry

## 2019-05-04 ENCOUNTER — Encounter: Payer: Self-pay | Admitting: Psychiatry

## 2019-05-04 ENCOUNTER — Other Ambulatory Visit: Payer: Self-pay

## 2019-05-04 DIAGNOSIS — F3130 Bipolar disorder, current episode depressed, mild or moderate severity, unspecified: Secondary | ICD-10-CM | POA: Diagnosis not present

## 2019-05-04 DIAGNOSIS — F1421 Cocaine dependence, in remission: Secondary | ICD-10-CM

## 2019-05-04 DIAGNOSIS — F4310 Post-traumatic stress disorder, unspecified: Secondary | ICD-10-CM | POA: Diagnosis not present

## 2019-05-04 DIAGNOSIS — Z20828 Contact with and (suspected) exposure to other viral communicable diseases: Secondary | ICD-10-CM | POA: Diagnosis not present

## 2019-05-04 DIAGNOSIS — F1021 Alcohol dependence, in remission: Secondary | ICD-10-CM | POA: Insufficient documentation

## 2019-05-04 DIAGNOSIS — F410 Panic disorder [episodic paroxysmal anxiety] without agoraphobia: Secondary | ICD-10-CM | POA: Insufficient documentation

## 2019-05-04 MED ORDER — LAMOTRIGINE 200 MG PO TABS: 200.0000 mg | ORAL_TABLET | Freq: Every day | ORAL | 0 refills | Status: DC

## 2019-05-04 MED ORDER — TRAZODONE HCL 100 MG PO TABS: 200.0000 mg | ORAL_TABLET | Freq: Every day | ORAL | 0 refills | Status: DC

## 2019-05-04 MED ORDER — ESCITALOPRAM OXALATE 10 MG PO TABS: 10.0000 mg | ORAL_TABLET | Freq: Every day | ORAL | 0 refills | Status: DC

## 2019-05-04 MED ORDER — BUSPIRONE HCL 30 MG PO TABS: 30.0000 mg | ORAL_TABLET | Freq: Two times a day (BID) | ORAL | 0 refills | Status: DC

## 2019-05-04 MED ORDER — CLONAZEPAM 0.5 MG PO TABS: 0.5000 mg | ORAL_TABLET | ORAL | 1 refills | Status: DC

## 2019-05-04 NOTE — Progress Notes (Deleted)
error 
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
## 2019-05-08 ENCOUNTER — Telehealth: Payer: Self-pay | Admitting: Psychiatry

## 2019-05-08 DIAGNOSIS — F3130 Bipolar disorder, current episode depressed, mild or moderate severity, unspecified: Secondary | ICD-10-CM

## 2019-05-08 DIAGNOSIS — F4310 Post-traumatic stress disorder, unspecified: Secondary | ICD-10-CM

## 2019-05-08 DIAGNOSIS — F410 Panic disorder [episodic paroxysmal anxiety] without agoraphobia: Secondary | ICD-10-CM

## 2019-05-08 MED ORDER — CLONAZEPAM 0.5 MG PO TABS: 0.5000 mg | ORAL_TABLET | ORAL | 1 refills | Status: DC

## 2019-05-08 NOTE — Telephone Encounter (Signed)
Sent Klonopin with clarified instructions and quantity .

## 2019-05-11 ENCOUNTER — Telehealth: Payer: Self-pay

## 2019-05-11 NOTE — Telephone Encounter (Signed)
received a fax from the pharmacy that they clonazepam .5mg  per pt insurance will only pay for 30 days

## 2019-05-15 ENCOUNTER — Other Ambulatory Visit: Payer: Self-pay | Admitting: Student in an Organized Health Care Education/Training Program

## 2019-05-20 DIAGNOSIS — Z20828 Contact with and (suspected) exposure to other viral communicable diseases: Secondary | ICD-10-CM | POA: Diagnosis not present

## 2019-05-30 ENCOUNTER — Other Ambulatory Visit: Payer: Self-pay

## 2019-05-30 ENCOUNTER — Emergency Department
Admission: EM | Admit: 2019-05-30 | Discharge: 2019-06-01 | Disposition: A | Discharge status: Other Acute Inpatient Hospital | Payer: Medicare PPO | Attending: Emergency Medicine | Admitting: Emergency Medicine

## 2019-05-30 DIAGNOSIS — F314 Bipolar disorder, current episode depressed, severe, without psychotic features: Secondary | ICD-10-CM

## 2019-05-30 DIAGNOSIS — Z20828 Contact with and (suspected) exposure to other viral communicable diseases: Secondary | ICD-10-CM | POA: Insufficient documentation

## 2019-05-30 DIAGNOSIS — T1491XA Suicide attempt, initial encounter: Secondary | ICD-10-CM

## 2019-05-30 DIAGNOSIS — Z79899 Other long term (current) drug therapy: Secondary | ICD-10-CM | POA: Insufficient documentation

## 2019-05-30 DIAGNOSIS — I10 Essential (primary) hypertension: Secondary | ICD-10-CM | POA: Insufficient documentation

## 2019-05-30 DIAGNOSIS — F319 Bipolar disorder, unspecified: Secondary | ICD-10-CM | POA: Diagnosis not present

## 2019-05-30 DIAGNOSIS — Z87891 Personal history of nicotine dependence: Secondary | ICD-10-CM | POA: Diagnosis not present

## 2019-05-30 DIAGNOSIS — T43202A Poisoning by unspecified antidepressants, intentional self-harm, initial encounter: Secondary | ICD-10-CM | POA: Diagnosis not present

## 2019-05-30 DIAGNOSIS — T50902A Poisoning by unspecified drugs, medicaments and biological substances, intentional self-harm, initial encounter: Secondary | ICD-10-CM

## 2019-05-30 DIAGNOSIS — T50992A Poisoning by other drugs, medicaments and biological substances, intentional self-harm, initial encounter: Secondary | ICD-10-CM | POA: Diagnosis present

## 2019-05-30 DIAGNOSIS — Z03818 Encounter for observation for suspected exposure to other biological agents ruled out: Secondary | ICD-10-CM | POA: Diagnosis not present

## 2019-05-30 DIAGNOSIS — F1010 Alcohol abuse, uncomplicated: Secondary | ICD-10-CM | POA: Diagnosis not present

## 2019-05-30 DIAGNOSIS — F3132 Bipolar disorder, current episode depressed, moderate: Secondary | ICD-10-CM | POA: Diagnosis present

## 2019-05-30 LAB — COMPREHENSIVE METABOLIC PANEL
ALT: 27 U/L (ref 0–44)
AST: 27 U/L (ref 15–41)
Albumin: 4.2 g/dL (ref 3.5–5.0)
Alkaline Phosphatase: 59 U/L (ref 38–126)
Anion gap: 12 (ref 5–15)
BUN: 7 mg/dL (ref 6–20)
CO2: 22 mmol/L (ref 22–32)
Calcium: 8.5 mg/dL — ABNORMAL LOW (ref 8.9–10.3)
Chloride: 106 mmol/L (ref 98–111)
Creatinine, Ser: 0.91 mg/dL (ref 0.61–1.24)
GFR calc Af Amer: >60 mL/min (ref >60)
GFR calc non Af Amer: >60 mL/min (ref >60)
Glucose, Bld: 123 mg/dL — ABNORMAL HIGH (ref 70–99)
Potassium: 3.3 mmol/L — ABNORMAL LOW (ref 3.5–5.1)
Sodium: 140 mmol/L (ref 135–145)
Total Bilirubin: 0.3 mg/dL (ref 0.3–1.2)
Total Protein: 7 g/dL (ref 6.5–8.1)

## 2019-05-30 LAB — CBC
HCT: 40.7 % (ref 39.0–52.0)
Hemoglobin: 14.2 g/dL (ref 13.0–17.0)
MCH: 31.8 pg (ref 26.0–34.0)
MCHC: 34.9 g/dL (ref 30.0–36.0)
MCV: 91.3 fL (ref 80.0–100.0)
Platelets: 174 10*3/uL (ref 150–400)
RBC: 4.46 MIL/uL (ref 4.22–5.81)
RDW: 12.6 % (ref 11.5–15.5)
WBC: 4.5 10*3/uL (ref 4.0–10.5)
nRBC: 0 % (ref 0.0–0.2)

## 2019-05-30 LAB — URINE DRUG SCREEN, QUALITATIVE (ARMC ONLY)
Amphetamines, Ur Screen: NOT DETECTED
Barbiturates, Ur Screen: NOT DETECTED
Benzodiazepine, Ur Scrn: NOT DETECTED
Cannabinoid 50 Ng, Ur ~~LOC~~: NOT DETECTED
Cocaine Metabolite,Ur ~~LOC~~: NOT DETECTED
MDMA (Ecstasy)Ur Screen: NOT DETECTED
Methadone Scn, Ur: NOT DETECTED
Opiate, Ur Screen: NOT DETECTED
Phencyclidine (PCP) Ur S: NOT DETECTED
Tricyclic, Ur Screen: NOT DETECTED

## 2019-05-30 LAB — ACETAMINOPHEN LEVEL: Acetaminophen (Tylenol), Serum: <10 ug/mL — ABNORMAL LOW (ref 10–30)

## 2019-05-30 LAB — ETHANOL: Alcohol, Ethyl (B): 278 mg/dL — ABNORMAL HIGH (ref <10)

## 2019-05-30 LAB — SALICYLATE LEVEL: Salicylate Lvl: <7 mg/dL (ref 2.8–30.0)

## 2019-05-30 MED ORDER — ONDANSETRON 4 MG PO TBDP
4.0000 mg | ORAL_TABLET | Freq: Four times a day (QID) | ORAL | Status: DC | PRN
  Administered 2019-05-30 – 2019-06-01 (×3): 4 mg via ORAL
  Filled 2019-05-30 (×6): qty 1

## 2019-05-30 NOTE — ED Triage Notes (Signed)
Patient reports feeling suicidal for past 5-7 days with plan (will not share plan).  Patient does contract for safety with this RN.

## 2019-05-31 DIAGNOSIS — F314 Bipolar disorder, current episode depressed, severe, without psychotic features: Secondary | ICD-10-CM

## 2019-05-31 DIAGNOSIS — F3132 Bipolar disorder, current episode depressed, moderate: Secondary | ICD-10-CM | POA: Diagnosis present

## 2019-05-31 DIAGNOSIS — T50902A Poisoning by unspecified drugs, medicaments and biological substances, intentional self-harm, initial encounter: Secondary | ICD-10-CM | POA: Diagnosis not present

## 2019-05-31 LAB — SARS CORONAVIRUS 2 BY RT PCR (HOSPITAL ORDER, PERFORMED IN ~~LOC~~ HOSPITAL LAB): SARS Coronavirus 2: NEGATIVE

## 2019-05-31 MED ORDER — ONDANSETRON 4 MG PO TBDP
4.0000 mg | ORAL_TABLET | Freq: Once | ORAL | Status: AC
  Administered 2019-05-31: 4 mg via ORAL

## 2019-05-31 MED ORDER — ADULT MULTIVITAMIN W/MINERALS CH
1.0000 | ORAL_TABLET | Freq: Every day | ORAL | Status: DC
  Administered 2019-05-31 – 2019-06-01 (×2): 1 via ORAL
  Filled 2019-05-31 (×2): qty 1

## 2019-05-31 MED ORDER — ESCITALOPRAM OXALATE 10 MG PO TABS
5.0000 mg | ORAL_TABLET | Freq: Every day | ORAL | Status: DC
  Administered 2019-05-31 – 2019-06-01 (×2): 5 mg via ORAL
  Filled 2019-05-31: qty 1

## 2019-05-31 MED ORDER — LORAZEPAM 2 MG/ML IJ SOLN: 1.0000 mg | Freq: Four times a day (QID) | INTRAMUSCULAR | Status: DC | PRN

## 2019-05-31 MED ORDER — TRAZODONE HCL 100 MG PO TABS: 200.0000 mg | ORAL_TABLET | Freq: Every day | ORAL | Status: DC

## 2019-05-31 MED ORDER — VITAMIN B-1 100 MG PO TABS
100.0000 mg | ORAL_TABLET | Freq: Every day | ORAL | Status: DC
  Administered 2019-05-31 – 2019-06-01 (×2): 100 mg via ORAL
  Filled 2019-05-31: qty 1

## 2019-05-31 MED ORDER — QUETIAPINE FUMARATE 25 MG PO TABS: 50.0000 mg | ORAL_TABLET | Freq: Every day | ORAL | Status: DC

## 2019-05-31 MED ORDER — ONDANSETRON 4 MG PO TBDP: 4.0000 mg | ORAL_TABLET | Freq: Once | ORAL | Status: DC

## 2019-05-31 MED ORDER — ADULT MULTIVITAMIN W/MINERALS CH: 1.0000 | ORAL_TABLET | Freq: Every day | ORAL | Status: DC

## 2019-05-31 MED ORDER — BUSPIRONE HCL 5 MG PO TABS
30.0000 mg | ORAL_TABLET | Freq: Two times a day (BID) | ORAL | Status: DC
  Filled 2019-05-31: qty 6

## 2019-05-31 MED ORDER — GABAPENTIN 300 MG PO CAPS
300.0000 mg | ORAL_CAPSULE | Freq: Three times a day (TID) | ORAL | Status: DC
  Administered 2019-05-31: 300 mg via ORAL
  Filled 2019-05-31: qty 1

## 2019-05-31 MED ORDER — GABAPENTIN 100 MG PO CAPS
100.0000 mg | ORAL_CAPSULE | Freq: Three times a day (TID) | ORAL | Status: DC
  Administered 2019-05-31 – 2019-06-01 (×3): 100 mg via ORAL
  Filled 2019-05-31 (×3): qty 1

## 2019-05-31 MED ORDER — METOCLOPRAMIDE HCL 10 MG PO TABS
10.0000 mg | ORAL_TABLET | Freq: Once | ORAL | Status: AC
  Administered 2019-05-31: 10 mg via ORAL
  Filled 2019-05-31: qty 1

## 2019-05-31 MED ORDER — THIAMINE HCL 100 MG/ML IJ SOLN: 100.0000 mg | Freq: Every day | INTRAMUSCULAR | Status: DC

## 2019-05-31 MED ORDER — LORAZEPAM 1 MG PO TABS: 1.0000 mg | ORAL_TABLET | Freq: Four times a day (QID) | ORAL | Status: DC | PRN

## 2019-05-31 MED ORDER — METOCLOPRAMIDE HCL 10 MG PO TABS: 10.0000 mg | ORAL_TABLET | Freq: Once | ORAL | Status: DC

## 2019-05-31 MED ORDER — CLOMIPHENE CITRATE 50 MG PO TABS: 50.0000 mg | ORAL_TABLET | ORAL | Status: DC

## 2019-05-31 MED ORDER — LAMOTRIGINE 100 MG PO TABS
200.0000 mg | ORAL_TABLET | Freq: Every day | ORAL | Status: DC
  Filled 2019-05-31: qty 2

## 2019-05-31 MED ORDER — LAMOTRIGINE 25 MG PO TABS
25.0000 mg | ORAL_TABLET | Freq: Two times a day (BID) | ORAL | Status: DC
  Administered 2019-05-31 – 2019-06-01 (×3): 25 mg via ORAL
  Filled 2019-05-31 (×4): qty 1

## 2019-05-31 MED ORDER — SILDENAFIL CITRATE 20 MG PO TABS
40.0000 mg | ORAL_TABLET | Freq: Every day | ORAL | Status: DC | PRN
  Filled 2019-05-31: qty 2

## 2019-05-31 MED ORDER — BUSPIRONE HCL 10 MG PO TABS
15.0000 mg | ORAL_TABLET | Freq: Two times a day (BID) | ORAL | Status: DC
  Administered 2019-05-31 – 2019-06-01 (×3): 15 mg via ORAL
  Filled 2019-05-31 (×2): qty 2

## 2019-05-31 MED ORDER — LORAZEPAM 2 MG PO TABS: 0.0000 mg | ORAL_TABLET | Freq: Two times a day (BID) | ORAL | Status: DC

## 2019-05-31 MED ORDER — ESCITALOPRAM OXALATE 10 MG PO TABS
10.0000 mg | ORAL_TABLET | Freq: Every day | ORAL | Status: DC
  Filled 2019-05-31: qty 1

## 2019-05-31 MED ORDER — ACETAMINOPHEN 500 MG PO TABS
ORAL_TABLET | ORAL | Status: AC
  Administered 2019-05-31: 1000 mg via ORAL
  Filled 2019-05-31: qty 1

## 2019-05-31 MED ORDER — LORAZEPAM 2 MG PO TABS
0.0000 mg | ORAL_TABLET | Freq: Four times a day (QID) | ORAL | Status: DC
  Administered 2019-05-31 – 2019-06-01 (×3): 2 mg via ORAL
  Filled 2019-05-31 (×3): qty 1

## 2019-05-31 MED ORDER — METOCLOPRAMIDE HCL 10 MG PO TABS
10.0000 mg | ORAL_TABLET | Freq: Three times a day (TID) | ORAL | Status: DC
  Administered 2019-05-31 – 2019-06-01 (×5): 10 mg via ORAL
  Filled 2019-05-31 (×7): qty 1

## 2019-05-31 MED ORDER — ACETAMINOPHEN 500 MG PO TABS
1000.0000 mg | ORAL_TABLET | Freq: Once | ORAL | Status: AC
  Administered 2019-05-31: 01:00:00 1000 mg via ORAL

## 2019-05-31 MED ORDER — FOLIC ACID 1 MG PO TABS
1.0000 mg | ORAL_TABLET | Freq: Every day | ORAL | Status: DC
  Administered 2019-05-31 – 2019-06-01 (×2): 1 mg via ORAL
  Filled 2019-05-31: qty 1

## 2019-05-31 MED ORDER — PANTOPRAZOLE SODIUM 40 MG PO TBEC
40.0000 mg | DELAYED_RELEASE_TABLET | Freq: Every day | ORAL | Status: DC
  Administered 2019-05-31 – 2019-06-01 (×2): 40 mg via ORAL
  Filled 2019-05-31 (×3): qty 1

## 2019-05-31 NOTE — ED Notes (Signed)
Hourly rounding reveals patient in room. No complaints, stable, in no acute distress. Q15 minute rounds and monitoring via Security Cam[employer] to continue. 
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
## 2019-05-31 NOTE — ED Notes (Signed)
Patient 1:1 sitter discontinued due to patient contracting for safety while here in the hospital. Patient has been appropriate and cooperative, NAD noted.

## 2019-05-31 NOTE — ED Notes (Signed)
Transferred to room BHU 2 by this tech and security guard Toler.

## 2019-05-31 NOTE — ED Provider Notes (Signed)
[facility] Emergency Department Provider Note  ____________________________________________  Time seen: Approximately 12:24 AM  I have reviewed the triage vital signs and the nursing notes.   HISTORY  Chief Complaint Psychiatric Evaluation   HPI Johnny Villa is a 43 y.o. male with a history of bipolar disorder and alcohol abuse who presents voluntarily for depression and suicidal thoughts.  Patient reports that he had been sober for several years.  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   He reports that over the last week he has been feeling very depressed. Symptoms are severe and constant. Has relapsed into alcohol.  Has been drinking 24 beers a day with the last intake right before to arrival.  Over the last 2 days he started having suicidal thoughts.  Reports taking several handfuls of trazodone that he is prescribed for sleep.  He calculates approximately 4000 mg of trazodone.  Last ingestion was 8 hours ago.  A week ago he had access to some opiates which he took orally.  Has not had any in a week.  He reports losing his job recently.  Past Medical History:  Diagnosis Date   Bipolar disorder [facility])    psychiatrist in [place]   Blood in stool    Depression    Drug abuse in remission [facility])    sober for 6 years   Fatty liver 01/2018   H/O alcohol abuse    Headache    migraines   Hypertension    Low testosterone in male     Patient Active Problem List   Diagnosis Date Noted   PTSD (post-traumatic stress disorder) 05/04/2019   Alcohol use disorder, moderate, in sustained remission ([place]) 05/04/2019   Panic attacks 05/04/2019   Orange-colored urine 03/20/2019   Migraine 08/01/2018   Gastric ulcer 08/01/2018   Chronic migraine without aura without status migrainosus, not intractable 07/15/2018   Tremor 07/15/2018   Hyperlipidemia 06/20/2018   Cervical spondylosis without myelopathy (C4,5,6,7) 01/08/2018    Cervical facet joint syndrome 01/08/2018   DDD (degenerative disc disease), cervical 01/08/2018   Epigastric pain 11/28/2017   Anal fissure 11/28/2017   Blood in stool 11/28/2017   Hemorrhoids 11/28/2017   Cervicalgia 11/28/2017   Chronic right shoulder pain 11/28/2017   Back pain 10/29/2017   Right hip pain 10/29/2017   Knee pain, right 10/29/2017   Hypogonadism in male 10/29/2017   Low testosterone 10/29/2017   Erectile dysfunction 10/29/2017   Bipolar I disorder, most recent episode depressed ([place]) 10/29/2017    Past Surgical History:  Procedure Laterality Date   APPENDECTOMY     2006   COLONOSCOPY WITH PROPOFOL N/A 12/30/2017   Procedure: COLONOSCOPY WITH PROPOFOL;  Surgeon: Lin Landsman, MD;  Location: [facility] ENDOSCOPY;  Service: Gastroenterology;  Laterality: N/A;   ESOPHAGOGASTRODUODENOSCOPY (EGD) WITH PROPOFOL N/A 12/30/2017   Procedure: ESOPHAGOGASTRODUODENOSCOPY (EGD) WITH PROPOFOL;  Surgeon: Lin Landsman, MD;  [facility]: [facility] ENDOSCOPY;  Service: Gastroenterology;  Laterality: N/A;    Prior to Admission medications   Medication Sig Start Date End Date Taking? Authorizing Provider  busPIRone (BUSPAR) 30 MG tablet Take 1 tablet (30 mg total) by mouth 2 (two) times daily. 05/04/19   Ursula Alert, MD  clomiPHENE (CLOMID) 50 MG tablet Take 1 tablet (50 mg total) by mouth every other day. Patient taking differently: Take 50 mg by mouth every other day. Takes differently 11/26/17   McLean-Scocuzza, Nino Glow, MD  clonazePAM (KLONOPIN) 0.5 MG tablet Take 1 tablet (  0.5 mg total) by mouth as directed. Take 1 -2 times a month only for severe panic attacks only 05/08/19   Ursula Alert, MD  dicyclomine (BENTYL) 10 MG capsule Take 2 capsules (20 mg total) by mouth 4 (four) times daily -  before meals and at bedtime. 05/02/18 06/01/18  Lin Landsman, MD  escitalopram (LEXAPRO) 10 MG tablet Take 1 tablet (10 mg total) by mouth daily. 05/04/19   Ursula Alert, MD    lamoTRIgine (LAMICTAL) 200 MG tablet Take 1 tablet (200 mg total) by mouth daily. 05/04/19   Ursula Alert, MD  Multiple Vitamins-Minerals (MULTIVITAMIN MEN) TABS Take 1 tablet by mouth daily. 07/22/17   [provider]  mupirocin ointment (BACTROBAN) 2 % Apply 1 application topically 2 (two) times daily. 08/01/18   McLean-Scocuzza, Nino Glow, MD  pantoprazole (PROTONIX) 40 MG tablet Take 1 tablet (40 mg total) by mouth daily. 08/01/18   McLean-Scocuzza, Nino Glow, MD  SALINE MIST 0.65 % nasal spray Place 1 application into both nostrils as directed. 01/07/18   [provider]  sildenafil (REVATIO) 20 MG tablet Take 2 tablets (40 mg total) by mouth daily. Prn 12/30/18   McLean-Scocuzza, Nino Glow, MD  SUMAtriptan (IMITREX) 100 MG tablet May repeat in 2 hours if not helping. Max dose 200 mg in 1 day. Do not take more than 2x per week 02/25/19   McLean-Scocuzza, Nino Glow, MD  traZODone (DESYREL) 100 MG tablet Take 2 tablets (200 mg total) by mouth at bedtime. sleep 05/04/19   Ursula Alert, MD  triamcinolone cream (KENALOG) 0.1 % Apply 1 application topically 2 (two) times daily. To bug bites not face, underarms or private area 12/30/18   McLean-Scocuzza, Nino Glow, MD    Allergies Patient has no known allergies.  Family History  Problem Relation Age of Onset   Depression Mother    Alcohol abuse Father    Cancer Father        prostate dx'ed 56   COPD Father    Hyperlipidemia Father    Hypertension Father     Social History Social History   Tobacco Use   Smoking status: Former Smoker    Types: Cigarettes    Quit date: 2020    Years since quitting: 0.6   Smokeless tobacco: Never Used  Substance Use Topics   Alcohol use: No    Frequency: Never    Comment: former quit 03/14/2012   Drug use: No    Comment: former quit cocaine 03/14/2012     Review of Systems  Constitutional: Negative for fever. Eyes: Negative for visual changes. ENT: Negative for sore throat. Neck:  No neck pain  Cardiovascular: Negative for chest pain. Respiratory: Negative for shortness of breath. Gastrointestinal: Negative for abdominal pain, vomiting or diarrhea. Genitourinary: Negative for dysuria. Musculoskeletal: Negative for back pain. Skin: Negative for rash. Neurological: Negative for headaches, weakness or numbness. Psych: + depression and SI. No HI  ____________________________________________   PHYSICAL EXAM:  VITAL SIGNS: ED Triage Vitals  Enc Vitals Group     BP 05/30/19 2234 136/66     Pulse Rate 05/30/19 2234 89     Resp 05/30/19 2234 18     Temp 05/30/19 2234 98.1 F (36.7 C)     Temp Source 05/30/19 2234 Oral     SpO2 05/30/19 2234 96 %     Weight 05/30/19 2230 210 lb (95.3 kg)     Height 05/30/19 2230 5\' 11"  (1.803 m)     Head Circumference --  Peak Flow --      Pain Score 05/30/19 2230 0     Pain Loc --      Pain Edu? --      Excl. in [place]? --     Constitutional: Alert and oriented. Well appearing and in no apparent distress. HEENT:      Head: Normocephalic and atraumatic.         Eyes: Conjunctivae are normal. Sclera is non-icteric.       Mouth/Throat: Mucous membranes are moist.       Neck: Supple with no signs of meningismus. Cardiovascular: Regular rate and rhythm. No murmurs, gallops, or rubs. 2+ symmetrical distal pulses are present in all extremities. No JVD. Respiratory: Normal respiratory effort. Lungs are clear to auscultation bilaterally. No wheezes, crackles, or rhonchi.  Gastrointestinal: Soft, non tender, and non distended with positive bowel sounds. No rebound or guarding. Musculoskeletal: Nontender with normal range of motion in all extremities. No edema, cyanosis, or erythema of extremities. Neurologic: Normal speech and language. Face is symmetric. Moving all extremities. No gross focal neurologic deficits are appreciated. Skin: Skin is warm, dry and intact. No rash noted. Psychiatric: Mood and affect are normal. Speech and  behavior are normal.  ____________________________________________   LABS (all labs ordered are listed, but only abnormal results are displayed)  Labs Reviewed  COMPREHENSIVE METABOLIC PANEL - Abnormal; Notable for the following components:      Result Value   Potassium 3.3 (*)    Glucose, Bld 123 (*)    Calcium 8.5 (*)    All other components within normal limits  ETHANOL - Abnormal; Notable for the following components:   Alcohol, Ethyl (B) 278 (*)    All other components within normal limits  ACETAMINOPHEN LEVEL - Abnormal; Notable for the following components:   Acetaminophen (Tylenol), Serum <10 (*)    All other components within normal limits  SALICYLATE LEVEL  CBC  URINE DRUG SCREEN, QUALITATIVE ([facility] ONLY)   ____________________________________________  EKG  ED ECG REPORT I, Rudene Re, the attending physician, personally viewed and interpreted this ECG.  Normal sinus rhythm, rate of 77, normal intervals, calculated QTC of 453, no ST elevations or depressions. ____________________________________________  RADIOLOGY  none  ____________________________________________   PROCEDURES  Procedure(s) performed: None Procedures Critical Care performed:  None ____________________________________________   INITIAL IMPRESSION / ASSESSMENT AND PLAN / ED COURSE  43 y.o. male with a history of bipolar disorder and alcohol abuse who presents voluntarily for depression and suicidal thoughts.  Patient has been taking several handfuls of trazodone 50 mg over the last 2 days.  Last ingestion was 8 hours ago.  Estimates taking about 4000 mg.  Likely patient is completely asymptomatic.  Since last ingestion was 8 hours ago patient is theoretically medically clear.  His EKG shows no changes and intervals.  Patient is not sedated.  GCS of 15, alert and oriented x3.  Last alcohol intake was this evening. Patient started on CIWA protocol.  Patient is complaining of mild nausea  and therefore was given Zofran.  Labs for medical clearance showing no significant abnormalities other than alcohol level of 278.  Patient meets criteria for IVC which was taken by me. Psychiatry will be consulted.        As part of my medical decision making, I reviewed the following data within the [place] notes reviewed and incorporated, Labs reviewed , EKG interpreted , Old chart reviewed, Notes from prior ED visits and  Controlled  Substance Database   Patient was evaluated in Emergency Department today for the symptoms described in the history of present illness. Patient was evaluated in the context of the global COVID-19 pandemic, which necessitated consideration that the patient might be at risk for infection with the SARS-CoV-2 virus that causes COVID-19. Institutional protocols and algorithms that pertain to the evaluation of patients at risk for COVID-19 are in a state of rapid change based on information released by regulatory bodies including the CDC and federal and state organizations. These policies and algorithms were followed during the patient's care in the ED.   ____________________________________________   FINAL CLINICAL IMPRESSION(S) / ED DIAGNOSES   Final diagnoses:  Severe episode of recurrent major depressive disorder, without psychotic features ([place])  Intentional drug overdose, initial encounter ([place])  Suicide attempt ([place])  Alcohol abuse      NEW MEDICATIONS STARTED DURING THIS VISIT:  ED Discharge Orders    None       Note:  This document was prepared using Dragon voice recognition software and may include unintentional dictation errors.    Rudene Re, MD 05/31/19 [phone]

## 2019-05-31 NOTE — ED Notes (Signed)
Introduced self to pt  Patient oriented to unit/care area: Informed that, for their safety, care areas are designed for safety and visiting and phone hours explained to patient. Patient verbalizes understanding, and verbal contract for safety obtained  Environment secured. Patient contracted for safety but still feels suicidal, patient complained of feeling anxious and wobbly due to the substances he ingested prior

## 2019-05-31 NOTE — ED Notes (Signed)
Hourly rounding reveals patient in room. Patient asking for sleep meds, requested from [place]. No complaints, stable, in no acute distress. Q15 minute rounds and monitoring via [employer] to continue.

## 2019-05-31 NOTE — ED Notes (Addendum)
Pt resting in room watching tv. Pt is calm. Pt given a snack and a sprite.

## 2019-05-31 NOTE — ED Notes (Signed)
IVC/Consult completed/Moved to BHU-2

## 2019-05-31 NOTE — ED Notes (Signed)
This RN at beside for the last 10 mins attempting to give pt plan of care to wellness; pt reports despondent and "20 min ago I felt like walking out of here and going home to finish the job, I don't want to wake up"

## 2019-05-31 NOTE — ED Notes (Signed)
Patient taking a shower.

## 2019-05-31 NOTE — ED Provider Notes (Signed)
-----------------------------------------   10:41 AM on 05/31/2019 -----------------------------------------  patient has been seen and evaluated by psychiatry.  They will be admitting to their service once a bed becomes available.  Patient is medically cleared at this time.  Covered swab pending.   Harvest Dark, MD 05/31/19 [phone]

## 2019-05-31 NOTE — ED Notes (Signed)
Pt found to have removed plastic ring from emesis bag and abraded skin at left wrist, Dr Alfred Levins informed, Mayra, EDT 1:1 sitter att

## 2019-05-31 NOTE — ED Notes (Signed)
This tech gave pt dinner tray.

## 2019-05-31 NOTE — ED Notes (Signed)
Report to include Situation, Background, Assessment, and Recommendations received from Jadeka RN. Patient alert and oriented, warm and dry, in no acute distress. Patient denies SI, HI, AVH and pain. Patient made aware of Q15 minute rounds and security cameras for their safety. Patient instructed to come to me with needs or concerns. 

## 2019-05-31 NOTE — Consult Note (Addendum)
[facility] Face-to-Face Psychiatry Consult   Reason for Consult:  Overdose  Referring Physician:  EDP Patient Identification: Johnny Villa MRN:  [id number] Principal Diagnosis: Bipolar affective disorder, depressed, severe ([place]) Diagnosis:  Principal Problem:   Bipolar affective disorder, depressed, severe ([place])  Total Time spent with patient: 1 hour  Subjective:   Johnny Villa is a 43 y.o. male patient admitted with suicide attempt, "Not doing well."  Reports he stopped taking his medications ten days ago and the relapsed on pills, then alcohol.  Suicidal ideations started a week ago and increased until last night he overdosed on Trazodone and alcohol.  Patient seen and evaluated in person by this provider.  Patient sitting up in bed, cross-legged, eating breakfast and watching television.  Reports he has been clean from drugs and alcohol for 7 years.  He felt good and stopped taking his medications, two days later he took pain pills to get high.  Then he started drinking, drinking about a case of beer daily.  BAL 278, negative UDS.  While in his room earlier, he took off his arm band and scratched his wrist with it, voicing he wanted to finish what he started--he was still intoxicated at the time.  Now, he contacts for safety and will contact staff if he wants to hurt himself.  Four past attempts, last one was in 2012 and 2008.  Cluster B traits dominant the assessment, no drowsiness--alert and oriented.  Continues to endorse suicidal ideations, high depression and anxiety levels--increase with stress and drugs/alcohol, decrease with medications.  He did connect with Dr Shea Evans on a first tele-visit recently.  Per TTS:  43 y.o. male who presents to the ER due to taking an intentional overdose to end his life. Patient states, approximately ten days ago, he stopped taking his medications and the symptoms of depression returned and increased. He relapsed on alcohol and pain pills after he was sober for  approximately seven years, to cope with his moods. His depression progress to the point of feeling, hopeless and helpless. Thus, he took an overdose of his trazadone. Patient states, he had two suicide attempts in the past; 2012 & 2008. Patient continues to voice thoughts of ending his life and he's unable to contract for safety at this time.  HPI per MD:  Johnny Villa is a 43 y.o. male with a history of bipolar disorder and alcohol abuse who presents voluntarily for depression and suicidal thoughts.  Patient reports that he had been sober for several years.  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  He reports that over the last week he has been feeling very depressed. Symptoms are severe and constant. Has relapsed into alcohol.  Has been drinking 24 beers a day with the last intake right before to arrival.  Over the last 2 days he started having suicidal thoughts.  Reports taking several handfuls of trazodone that he is prescribed for sleep.  He calculates approximately 4000 mg of trazodone.  Last ingestion was 8 hours ago.  A week ago he had access to some opiates which he took orally.  Has not had any in a week.  He reports losing his job recently.  Past Psychiatric History:  Bipolar d/o  Risk to Self:  yes Risk to Others:  none Prior Inpatient Therapy:  yes Prior Outpatient Therapy:  yes  Past Medical History:  Past Medical History:  Diagnosis Date  . Bipolar disorder [facility])    psychiatrist in [place]  .  Blood in stool   . Depression   . Drug abuse in remission [facility])    sober for 6 years  . Fatty liver 01/2018  . H/O alcohol abuse   . Headache    migraines  . Hypertension   . Low testosterone in male     Past Surgical History:  Procedure Laterality Date  . APPENDECTOMY     2006  . COLONOSCOPY WITH PROPOFOL N/A 12/30/2017   Procedure: COLONOSCOPY WITH PROPOFOL;  Surgeon: Lin Landsman, MD;  Location: [facility] ENDOSCOPY;  Service: Gastroenterology;  Laterality:  N/A;  . ESOPHAGOGASTRODUODENOSCOPY (EGD) WITH PROPOFOL N/A 12/30/2017   Procedure: ESOPHAGOGASTRODUODENOSCOPY (EGD) WITH PROPOFOL;  Surgeon: Lin Landsman, MD;  Location: [facility] ENDOSCOPY;  Service: Gastroenterology;  Laterality: N/A;   Family History:  Family History  Problem Relation Age of Onset  . Depression Mother   . Alcohol abuse Father   . Cancer Father        prostate dx'ed 78  . COPD Father   . Hyperlipidemia Father   . Hypertension Father    Family Psychiatric  History: see above Social History:  Social History   Substance and Sexual Activity  Alcohol Use No  . Frequency: Never   Comment: former quit 03/14/2012     Social History   Substance and Sexual Activity  Drug Use No   Comment: former quit cocaine 03/14/2012     Social History   Socioeconomic History  . Marital status: Single    Spouse name: Not on file  . Number of children: Not on file  . Years of education: Not on file  . Highest education level: Not on file  Occupational History  . Occupation: disabled  Social Needs  . Financial resource strain: Not hard at all  . Food insecurity    Worry: Patient refused    Inability: Patient refused  . Transportation needs    Medical: Patient refused    Non-medical: Patient refused  Tobacco Use  . Smoking status: Former Smoker    Types: Cigarettes    Quit date: 2020    Years since quitting: 0.6  . Smokeless tobacco: Never Used  Substance and Sexual Activity  . Alcohol use: No    Frequency: Never    Comment: former quit 03/14/2012  . Drug use: No    Comment: former quit cocaine 03/14/2012   . Sexual activity: Yes  Lifestyle  . Physical activity    Days per week: Patient refused    Minutes per session: Patient refused  . Stress: Not on file  Relationships  . Social Herbalist on phone: Patient refused    Gets together: Patient refused    Attends religious service: Patient refused    Active member of club or organization: Patient  refused    Attends meetings of clubs or organizations: Patient refused    Relationship status: Patient refused  Other Topics Concern  . Not on file  Social History Narrative   No kids   MBA   Disabled    Additional Social History:  None    Allergies:  No Known Allergies  Labs:  Results for orders placed or performed during the hospital encounter of 05/30/19 (from the past 48 hour(s))  Comprehensive metabolic panel     Status: Abnormal   Collection Time: 05/30/19 10:46 PM  Result Value Ref Range   Sodium 140 135 - 145 mmol/L   Potassium 3.3 (L) 3.5 - 5.1 mmol/L   Chloride 106  98 - 111 mmol/L   CO2 22 22 - 32 mmol/L   Glucose, Bld 123 (H) 70 - 99 mg/dL   BUN 7 6 - 20 mg/dL   Creatinine, Ser 0.91 0.61 - 1.24 mg/dL   Calcium 8.5 (L) 8.9 - 10.3 mg/dL   Total Protein 7.0 6.5 - 8.1 g/dL   Albumin 4.2 3.5 - 5.0 g/dL   AST 27 15 - 41 U/L   ALT 27 0 - 44 U/L   Alkaline Phosphatase 59 38 - 126 U/L   Total Bilirubin 0.3 0.3 - 1.2 mg/dL   GFR calc non Af Amer >60 >60 mL/min   GFR calc Af Amer >60 >60 mL/min   Anion gap 12 5 - 15    Comment: Performed at [facility], [address] [place], [place] [address]  Ethanol     Status: Abnormal   Collection Time: 05/30/19 10:46 PM  Result Value Ref Range   Alcohol, Ethyl (B) 278 (H) <10 mg/dL    Comment: (NOTE) Lowest detectable limit for serum alcohol is 10 mg/dL. For medical purposes only. Performed at [facility], [address] [place], [place] [id number]   Salicylate level     Status: None   Collection Time: 05/30/19 10:46 PM  Result Value Ref Range   Salicylate Lvl [id number] 2.8 - 30.0 mg/dL    Comment: Performed at [facility], [address] [place], [place] [address]  Acetaminophen level     Status: Abnormal   Collection Time: 05/30/19 10:46 PM  Result Value Ref Range   Acetaminophen (Tylenol), Serum <10 (L) 10 - 30 ug/mL    Comment: (NOTE) Therapeutic concentrations vary significantly. A  range of 10-30 ug/mL  may be an effective concentration for many patients. However, some  are best treated at concentrations outside of this range. Acetaminophen concentrations >150 ug/mL at 4 hours after ingestion  and >50 ug/mL at 12 hours after ingestion are often associated with  toxic reactions. Performed at [facility], [address] [place], [place] [address]   cbc     Status: None   Collection Time: 05/30/19 10:46 PM  Result Value Ref Range   WBC 4.5 4.0 - 10.5 K/uL   RBC 4.46 4.22 - 5.81 MIL/uL   Hemoglobin 14.2 13.0 - 17.0 g/dL   HCT 40.7 39.0 - 52.0 %   MCV 91.3 80.0 - 100.0 fL   MCH 31.8 26.0 - 34.0 pg   MCHC 34.9 30.0 - 36.0 g/dL   RDW 12.6 11.5 - 15.5 %   Platelets 174 150 - 400 K/uL   nRBC 0.0 0.0 - 0.2 %    Comment: Performed at [facility], [address] [place], [place] [address]  Urine Drug Screen, Qualitative     Status: None   Collection Time: 05/30/19 10:47 PM  Result Value Ref Range   Tricyclic, Ur Screen NONE DETECTED NONE DETECTED   Amphetamines, Ur Screen NONE DETECTED NONE DETECTED   MDMA (Ecstasy)Ur Screen NONE DETECTED NONE DETECTED   Cocaine Metabolite,Ur [place] NONE DETECTED NONE DETECTED   Opiate, Ur Screen NONE DETECTED NONE DETECTED   Phencyclidine (PCP) Ur S NONE DETECTED NONE DETECTED   Cannabinoid 50 Ng, Ur [place] NONE DETECTED NONE DETECTED   Barbiturates, Ur Screen NONE DETECTED NONE DETECTED   Benzodiazepine, Ur Scrn NONE DETECTED NONE DETECTED   Methadone Scn, Ur NONE DETECTED NONE DETECTED    Comment: (NOTE) Tricyclics + metabolites, urine    Cutoff 1000  ng/mL Amphetamines + metabolites, urine  Cutoff 1000 ng/mL MDMA (Ecstasy), urine              Cutoff 500 ng/mL Cocaine Metabolite, urine          Cutoff 300 ng/mL Opiate + metabolites, urine        Cutoff 300 ng/mL Phencyclidine (PCP), urine         Cutoff 25 ng/mL Cannabinoid, urine                 Cutoff 50 ng/mL Barbiturates + metabolites, urine  Cutoff 200  ng/mL Benzodiazepine, urine              Cutoff 200 ng/mL Methadone, urine                   Cutoff 300 ng/mL The urine drug screen provides only a preliminary, unconfirmed analytical test result and should not be used for non-medical purposes. Clinical consideration and professional judgment should be applied to any positive drug screen result due to possible interfering substances. A more specific alternate chemical method must be used in order to obtain a confirmed analytical result. Gas chromatography / mass spectrometry (GC/MS) is the preferred confirmat ory method. Performed at [facility], [address] [place], [place] [address]     Current Facility-Administered Medications  Medication Dose Route Frequency Provider Last Rate Last Dose  . busPIRone (BUSPAR) tablet 30 mg  30 mg Oral BID Patrecia Pour, NP      . clomiPHENE (CLOMID) tablet 50 mg  50 mg Oral Bess Harvest, NP      . escitalopram (LEXAPRO) tablet 10 mg  10 mg Oral Daily Patrecia Pour, NP      . lamoTRIgine (LAMICTAL) tablet 200 mg  200 mg Oral Daily Patrecia Pour, NP      . metoCLOPramide (REGLAN) tablet 10 mg  10 mg Oral TID AC & HS Patrecia Pour, NP      . Multivitamin Men TABS 1 tablet  1 tablet Oral Daily Waylan Boga Y, NP      . ondansetron (ZOFRAN-ODT) disintegrating tablet 4 mg  4 mg Oral Q6H PRN Rudene Re, MD   4 mg at 05/30/19 2337  . pantoprazole (PROTONIX) EC tablet 40 mg  40 mg Oral Daily Skila Rollins Y, NP      . sildenafil (REVATIO) tablet 40 mg  40 mg Oral Daily Waylan Boga Y, NP      . traZODone (DESYREL) tablet 200 mg  200 mg Oral QHS Patrecia Pour, NP       Current Outpatient Medications  Medication Sig Dispense Refill  . busPIRone (BUSPAR) 30 MG tablet Take 1 tablet (30 mg total) by mouth 2 (two) times daily. 180 tablet 0  . clomiPHENE (CLOMID) 50 MG tablet Take 1 tablet (50 mg total) by mouth every other day. (Patient taking differently: Take 50 mg by  mouth every other day. (or as directed)) 60 tablet 0  . escitalopram (LEXAPRO) 10 MG tablet Take 1 tablet (10 mg total) by mouth daily. 90 tablet 0  . lamoTRIgine (LAMICTAL) 200 MG tablet Take 1 tablet (200 mg total) by mouth daily. 90 tablet 0  . Multiple Vitamins-Minerals (MULTIVITAMIN MEN) TABS Take 1 tablet by mouth daily.    . pantoprazole (PROTONIX) 40 MG tablet Take 1 tablet (40 mg total) by mouth daily. 90 tablet 3  . SALINE MIST 0.65 % nasal spray Place 1 application into both  nostrils as directed.    . sildenafil (REVATIO) 20 MG tablet Take 2 tablets (40 mg total) by mouth daily. Prn 60 tablet 5  . SUMAtriptan (IMITREX) 100 MG tablet May repeat in 2 hours if not helping. Max dose 200 mg in 1 day. Do not take more than 2x per week 9 tablet 5  . traZODone (DESYREL) 100 MG tablet Take 2 tablets (200 mg total) by mouth at bedtime. sleep 180 tablet 0  . mupirocin ointment (BACTROBAN) 2 % Apply 1 application topically 2 (two) times daily. (Patient not taking: Reported on 05/31/2019) 30 g 0  . triamcinolone cream (KENALOG) 0.1 % Apply 1 application topically 2 (two) times daily. To bug bites not face, underarms or private area (Patient not taking: Reported on 05/31/2019) 454 g 0    Musculoskeletal: Strength & Muscle Tone: within normal limits Gait & Station: normal Patient leans: N/A  Psychiatric Specialty Exam: Physical Exam  Nursing note and vitals reviewed. Constitutional: He is oriented to person, place, and time. He appears well-developed and well-nourished.  HENT:  Head: Normocephalic.  Neck: Normal range of motion.  Respiratory: Effort normal.  Musculoskeletal: Normal range of motion.  Neurological: He is alert and oriented to person, place, and time.  Psychiatric: His speech is normal and behavior is normal. His mood appears anxious. Cognition and memory are normal. He expresses impulsivity. He exhibits a depressed mood. He expresses suicidal ideation. He expresses suicidal  plans.    Review of Systems  Psychiatric/Behavioral: Positive for depression, substance abuse and suicidal ideas. The patient is nervous/anxious.   All other systems reviewed and are negative.   Blood pressure 136/66, pulse 89, temperature 98.1 F (36.7 C), temperature source Oral, resp. rate 18, height 5\' 11"  (1.803 m), weight 95.3 kg, SpO2 96 %.Body mass index is 29.29 kg/m.  General Appearance: Casual  Eye Contact:  Good  Speech:  Normal Rate  Volume:  Normal  Mood:  Anxious and Depressed  Affect:  Congruent  Thought Process:  Coherent and Descriptions of Associations: Intact  Orientation:  Full (Time, Place, and Person)  Thought Content:  WDL and Logical  Suicidal Thoughts:  No  Homicidal Thoughts:  No  Memory:  Immediate;   Good Recent;   Good Remote;   Good  Judgement:  Poor  Insight:  Fair  Psychomotor Activity:  Decreased  Concentration:  Concentration: Good and Attention Span: Fair  Recall:  [employer] of Knowledge:  Fair  Language:  Good  Akathisia:  No  Handed:  Right  AIMS (if indicated):     Assets:  Leisure Time Physical Health Resilience  ADL's:  Intact  Cognition:  WNL  Sleep:       Treatment Plan Summary: Daily contact with patient to assess and evaluate symptoms and progress in treatment, Medication management and Plan bipolar affective disorder, depressed, severe:  -Restart Lamictal 25 mg BID -Restart lexapro 5 mg daily -Admit to [facility]  Anxiety: -Restart Buspar 15 mg BID  Insomnia: Discontinue Trazodone due to overdose -Prolonged QT on EKG  Alcohol dependence: -Ativan alcohol detox protocol -Discontinue Klonopin -Started gabapentin 100 mg TID to assist withdrawals and anxiety  Disposition: Recommend psychiatric Inpatient admission when medically cleared.  Waylan Boga, NP 05/31/2019 9:26 AM

## 2019-05-31 NOTE — ED Notes (Signed)
Pt is calm  In his room. Pt is watching tv. Pt requested a shower. This tech set up pt to take a shower after the nurse finishes giving him his medication.

## 2019-06-01 ENCOUNTER — Inpatient Hospital Stay
Admission: AD | Admit: 2019-06-01 | Discharge: 2019-06-03 | DRG: 885 | Disposition: A | Discharge status: Home / Self Care | Payer: Medicare PPO | Attending: Psychiatry | Admitting: Psychiatry

## 2019-06-01 ENCOUNTER — Other Ambulatory Visit: Payer: Self-pay

## 2019-06-01 DIAGNOSIS — T43212A Poisoning by selective serotonin and norepinephrine reuptake inhibitors, intentional self-harm, initial encounter: Secondary | ICD-10-CM | POA: Diagnosis present

## 2019-06-01 DIAGNOSIS — F314 Bipolar disorder, current episode depressed, severe, without psychotic features: Principal | ICD-10-CM | POA: Diagnosis present

## 2019-06-01 DIAGNOSIS — F319 Bipolar disorder, unspecified: Secondary | ICD-10-CM | POA: Diagnosis not present

## 2019-06-01 DIAGNOSIS — F4310 Post-traumatic stress disorder, unspecified: Secondary | ICD-10-CM | POA: Diagnosis present

## 2019-06-01 DIAGNOSIS — F3130 Bipolar disorder, current episode depressed, mild or moderate severity, unspecified: Secondary | ICD-10-CM

## 2019-06-01 DIAGNOSIS — Z7289 Other problems related to lifestyle: Secondary | ICD-10-CM | POA: Diagnosis not present

## 2019-06-01 DIAGNOSIS — K259 Gastric ulcer, unspecified as acute or chronic, without hemorrhage or perforation: Secondary | ICD-10-CM

## 2019-06-01 DIAGNOSIS — F1010 Alcohol abuse, uncomplicated: Secondary | ICD-10-CM

## 2019-06-01 DIAGNOSIS — K219 Gastro-esophageal reflux disease without esophagitis: Secondary | ICD-10-CM | POA: Diagnosis present

## 2019-06-01 DIAGNOSIS — Z87891 Personal history of nicotine dependence: Secondary | ICD-10-CM

## 2019-06-01 DIAGNOSIS — R45851 Suicidal ideations: Secondary | ICD-10-CM | POA: Diagnosis present

## 2019-06-01 DIAGNOSIS — F3132 Bipolar disorder, current episode depressed, moderate: Secondary | ICD-10-CM | POA: Diagnosis present

## 2019-06-01 MED ORDER — LORAZEPAM 2 MG PO TABS: 0.0000 mg | ORAL_TABLET | Freq: Two times a day (BID) | ORAL | Status: DC

## 2019-06-01 MED ORDER — METOCLOPRAMIDE HCL 10 MG PO TABS
10.0000 mg | ORAL_TABLET | Freq: Three times a day (TID) | ORAL | Status: DC
  Administered 2019-06-01 – 2019-06-02 (×4): 10 mg via ORAL
  Filled 2019-06-01 (×7): qty 1

## 2019-06-01 MED ORDER — ESCITALOPRAM OXALATE 10 MG PO TABS
5.0000 mg | ORAL_TABLET | Freq: Every day | ORAL | Status: DC
  Administered 2019-06-02: 5 mg via ORAL
  Filled 2019-06-01: qty 1

## 2019-06-01 MED ORDER — SILDENAFIL CITRATE 20 MG PO TABS
40.0000 mg | ORAL_TABLET | Freq: Every day | ORAL | Status: DC | PRN
  Filled 2019-06-01: qty 2

## 2019-06-01 MED ORDER — ADULT MULTIVITAMIN W/MINERALS CH
1.0000 | ORAL_TABLET | Freq: Every day | ORAL | Status: DC
  Administered 2019-06-02 – 2019-06-03 (×2): 1 via ORAL
  Filled 2019-06-01 (×2): qty 1

## 2019-06-01 MED ORDER — CLOMIPHENE CITRATE 50 MG PO TABS
50.0000 mg | ORAL_TABLET | ORAL | Status: DC
  Administered 2019-06-02: 10:00:00 50 mg via ORAL
  Filled 2019-06-01: qty 1

## 2019-06-01 MED ORDER — PANTOPRAZOLE SODIUM 40 MG PO TBEC
40.0000 mg | DELAYED_RELEASE_TABLET | Freq: Every day | ORAL | Status: DC
  Administered 2019-06-02 – 2019-06-03 (×2): 40 mg via ORAL
  Filled 2019-06-01 (×2): qty 1

## 2019-06-01 MED ORDER — FOLIC ACID 1 MG PO TABS
1.0000 mg | ORAL_TABLET | Freq: Every day | ORAL | Status: DC
  Administered 2019-06-02 – 2019-06-03 (×2): 1 mg via ORAL
  Filled 2019-06-01 (×2): qty 1

## 2019-06-01 MED ORDER — MAGNESIUM HYDROXIDE 400 MG/5ML PO SUSP: 30.0000 mL | Freq: Every day | ORAL | Status: DC | PRN

## 2019-06-01 MED ORDER — ALUM & MAG HYDROXIDE-SIMETH 200-200-20 MG/5ML PO SUSP: 30.0000 mL | ORAL | Status: DC | PRN

## 2019-06-01 MED ORDER — LORAZEPAM 1 MG PO TABS
1.0000 mg | ORAL_TABLET | Freq: Four times a day (QID) | ORAL | Status: DC | PRN
  Administered 2019-06-01: 1 mg via ORAL
  Filled 2019-06-01: qty 1

## 2019-06-01 MED ORDER — ONDANSETRON 4 MG PO TBDP
4.0000 mg | ORAL_TABLET | Freq: Four times a day (QID) | ORAL | Status: DC | PRN
  Administered 2019-06-02 (×2): 4 mg via ORAL
  Filled 2019-06-01 (×2): qty 1

## 2019-06-01 MED ORDER — BUSPIRONE HCL 5 MG PO TABS
15.0000 mg | ORAL_TABLET | Freq: Two times a day (BID) | ORAL | Status: DC
  Administered 2019-06-01 – 2019-06-02 (×2): 15 mg via ORAL
  Filled 2019-06-01 (×2): qty 3

## 2019-06-01 MED ORDER — ACETAMINOPHEN 325 MG PO TABS: 650.0000 mg | ORAL_TABLET | Freq: Four times a day (QID) | ORAL | Status: DC | PRN

## 2019-06-01 MED ORDER — THIAMINE HCL 100 MG/ML IJ SOLN: 100.0000 mg | Freq: Every day | INTRAMUSCULAR | Status: DC

## 2019-06-01 MED ORDER — LORAZEPAM 2 MG PO TABS
0.0000 mg | ORAL_TABLET | Freq: Four times a day (QID) | ORAL | Status: DC
  Administered 2019-06-01 – 2019-06-02 (×2): 2 mg via ORAL
  Filled 2019-06-01 (×2): qty 1

## 2019-06-01 MED ORDER — LORAZEPAM 2 MG/ML IJ SOLN: 1.0000 mg | Freq: Four times a day (QID) | INTRAMUSCULAR | Status: DC | PRN

## 2019-06-01 MED ORDER — VITAMIN B-1 100 MG PO TABS
100.0000 mg | ORAL_TABLET | Freq: Every day | ORAL | Status: DC
  Administered 2019-06-02 – 2019-06-03 (×2): 100 mg via ORAL
  Filled 2019-06-01 (×3): qty 1

## 2019-06-01 MED ORDER — GABAPENTIN 100 MG PO CAPS
100.0000 mg | ORAL_CAPSULE | Freq: Three times a day (TID) | ORAL | Status: DC
  Administered 2019-06-01 – 2019-06-02 (×3): 100 mg via ORAL
  Filled 2019-06-01 (×3): qty 1

## 2019-06-01 MED ORDER — LAMOTRIGINE 25 MG PO TABS
25.0000 mg | ORAL_TABLET | Freq: Two times a day (BID) | ORAL | Status: DC
  Administered 2019-06-01 – 2019-06-02 (×2): 25 mg via ORAL
  Filled 2019-06-01 (×2): qty 1

## 2019-06-01 NOTE — ED Provider Notes (Signed)
-----------------------------------------   6:12 AM on 06/01/2019 -----------------------------------------   Blood pressure 135/90, pulse 82, temperature 98.9 F (37.2 C), temperature source Oral, resp. rate 16, height 5\' 11"  (1.803 m), weight 95.3 kg, SpO2 100 %.  The patient is calm and cooperative at this time.  There have been no acute events since the last update.  Awaiting disposition plan from Behavioral Medicine.   Paulette Blanch, MD 06/01/19 [phone]

## 2019-06-01 NOTE — ED Notes (Signed)
BEHAVIORAL HEALTH ROUNDING Patient sleeping: No. Patient alert and oriented: yes Behavior appropriate: Yes.  ; If no, describe:  Nutrition and fluids offered: yes Toileting and hygiene offered: Yes  Sitter present: q15 minute observations and security camera monitoring   

## 2019-06-01 NOTE — Progress Notes (Signed)
Admission Note:   Report was received by Amy, RN on a 43 year old male who presents IVC in no acute distress for the treatment of SI/alcohol abuse and overdose. Patient was calm and cooperative with admission process. Patient presents endorsing anxiety, stating that the unknown, this whole process, and his stomach issues are what's making him anxious. Patient states that he has some financial/legal issues that will be resolved at the end of November. Patient rated his stomach pain an "8/10" stating that he has not been eating and relapsed on alcohol for the past ten days. Patient denies any signs/symptoms of depression as well SI/HI/AVH to this Probation officer. Patient's goals while here are to get back on track with his medications. Patient has an extensive past medical history. Skin was assessed with Gigi, RN and found to be clear of any abnormal marks. Patient searched and no contraband found and unit policies explained and understanding verbalized. Consents obtained. Food and fluids offered, and fluids accepted. Patient had no additional questions or concerns.

## 2019-06-01 NOTE — ED Notes (Signed)
Hourly rounding reveals patient sleeping in room. No complaints, stable, in no acute distress. Q15 minute rounds and monitoring via Security Cameras to continue. 

## 2019-06-01 NOTE — ED Notes (Signed)
Attempted to call report - nurse taking pt is at lunch

## 2019-06-01 NOTE — BH Assessment (Signed)
Patient is to be admitted to [place], NP.  Attending Physician will be Dr.Clapacs.  Patient has been [facility] room[facility] by [facility] Charge NurseDemetria.  Intake Paper Work has been signed and placed on patient chart.   ER staff is aware of the admission:  Lisa,ER Secretary   Dr.Issacs, ER MD  Amy T.,Patient's Nurse   Gwen,Patient Access.

## 2019-06-01 NOTE — Progress Notes (Signed)
Patient is alert and oriented x 4  Pleasant upon approach , seen in the milieu area with minimal interactions with peers, states that he is doing alright , his doing fine with his medications , patient is compliant with safety in the unit , good appetite , affect is good and appropriate mood, denies any SI/HI/AVH  And no signs of delusions , patient is adjusting well , only requiring 15 minutes safety checks, no distress.

## 2019-06-01 NOTE — ED Notes (Signed)
Pt is resting comfortably in his room.

## 2019-06-01 NOTE — ED Notes (Signed)
Patient observed lying in bed with eyes closed  Even, unlabored respirations observed   NAD pt appears to be sleeping  I will continue to monitor along with every 15 minute visual observations and ongoing security camera monitoring    

## 2019-06-01 NOTE — ED Notes (Signed)
ED BHU [place] Is the patient under IVC or is there intent for IVC: Yes.   Is the patient medically cleared: Yes.   Is there vacancy in the ED BHU: Yes.   Is the population mix appropriate for patient: Yes.   Is the patient awaiting placement in inpatient or outpatient setting: Yes.  Awaiting inpatient bed to be assigned  Has the patient had a psychiatric consult: Yes.   Survey of unit performed for contraband, proper placement and condition of furniture, tampering with fixtures in bathroom, shower, and each patient room: Yes.  ; Findings:  APPEARANCE/BEHAVIOR Calm and cooperative NEURO ASSESSMENT Orientation: oriented x3  Denies pain Hallucinations: No.None noted (Hallucinations) denies at this time Speech: Normal Gait: normal RESPIRATORY ASSESSMENT Even  Unlabored respirations  CARDIOVASCULAR ASSESSMENT Pulses equal   regular rate  Skin warm and dry   GASTROINTESTINAL ASSESSMENT no GI complaint EXTREMITIES Full ROM  PLAN OF CARE Provide calm/safe environment. Vital signs assessed twice daily. ED BHU Assessment once each 12-hour shift.  Assure the ED provider has rounded once each shift. Provide and encourage hygiene. Provide redirection as needed. Assess for escalating behavior; address immediately and inform ED provider.  Assess family dynamic and appropriateness for visitation as needed: Yes.  ; If necessary, describe findings:  Educate the patient/family about BHU procedures/visitation: Yes.  ; If necessary, describe findings:

## 2019-06-01 NOTE — Plan of Care (Signed)
New admission.   Problem: Education: Goal: Knowledge of [facility] Education information/materials will improve Outcome: Not Progressing Goal: Emotional status will improve Outcome: Not Progressing Goal: Mental status will improve Outcome: Not Progressing Goal: Verbalization of understanding the information provided will improve Outcome: Not Progressing   Problem: Activity: Goal: Interest or engagement in activities will improve Outcome: Not Progressing Goal: Sleeping patterns will improve Outcome: Not Progressing   Problem: Safety: Goal: Periods of time without injury will increase Outcome: Not Progressing   Problem: Health Behavior/Discharge Planning: Goal: Ability to identify changes in lifestyle to reduce recurrence of condition will improve Outcome: Not Progressing Goal: Identification of resources available to assist in meeting health care needs will improve Outcome: Not Progressing   Problem: Physical Regulation: Goal: Complications related to the disease process, condition or treatment will be avoided or minimized Outcome: Not Progressing   Problem: Safety: Goal: Ability to remain free from injury will improve Outcome: Not Progressing   Problem: Self-Concept: Goal: Ability to modify response to factors that promote anxiety will improve Outcome: Not Progressing

## 2019-06-01 NOTE — Plan of Care (Signed)
  Problem: Education: Goal: Knowledge of  General Education information/materials will improve Outcome: Progressing Goal: Emotional status will improve Outcome: Progressing Goal: Mental status will improve Outcome: Progressing Goal: Verbalization of understanding the information provided will improve Outcome: Progressing   Problem: Activity: Goal: Interest or engagement in activities will improve Outcome: Progressing Goal: Sleeping patterns will improve Outcome: Progressing   Problem: Safety: Goal: Periods of time without injury will increase Outcome: Progressing   Problem: Health Behavior/Discharge Planning: Goal: Ability to identify changes in lifestyle to reduce recurrence of condition will improve Outcome: Progressing Goal: Identification of resources available to assist in meeting health care needs will improve Outcome: Progressing   Problem: Physical Regulation: Goal: Complications related to the disease process, condition or treatment will be avoided or minimized Outcome: Progressing   Problem: Safety: Goal: Ability to remain free from injury will improve Outcome: Progressing   Problem: Self-Concept: Goal: Ability to modify response to factors that promote anxiety will improve Outcome: Progressing

## 2019-06-01 NOTE — Progress Notes (Signed)
Patient refused a handful of his evening medications stating that he already received them upstairs before coming to this unit.

## 2019-06-01 NOTE — Tx Team (Signed)
Initial Treatment Plan 06/01/2019 6:50 PM [name] [id number]    PATIENT STRESSORS: Financial difficulties Legal issue Medication change or noncompliance Substance abuse   PATIENT STRENGTHS: Ability for insight General fund of knowledge Motivation for treatment/growth Religious Affiliation   PATIENT IDENTIFIED PROBLEMS: Medication noncompliance  Relapse on Alcohol  Overdose  Financial/legal issues               DISCHARGE CRITERIA:  Ability to meet basic life and health needs Improved stabilization in mood, thinking, and/or behavior Reduction of life-threatening or endangering symptoms to within safe limits  PRELIMINARY DISCHARGE PLAN: Attend 12-step recovery group Outpatient therapy Return to previous living arrangement Return to previous work or school arrangements  PATIENT/FAMILY INVOLVEMENT: This treatment plan has been presented to and reviewed with the patient, Johnny Villa. The patient has been given the opportunity to ask questions and make suggestions.  Orah Sonnen, RN 06/01/2019, 6:50 PM

## 2019-06-01 NOTE — ED Notes (Signed)
Hourly rounding reveals patient in room. No complaints, stable, in no acute distress. Q15 minute rounds and monitoring via Security Cam[employer] to continue. 

## 2019-06-01 NOTE — ED Notes (Signed)

## 2019-06-02 DIAGNOSIS — F1010 Alcohol abuse, uncomplicated: Secondary | ICD-10-CM

## 2019-06-02 DIAGNOSIS — F314 Bipolar disorder, current episode depressed, severe, without psychotic features: Principal | ICD-10-CM

## 2019-06-02 MED ORDER — ESCITALOPRAM OXALATE 10 MG PO TABS
10.0000 mg | ORAL_TABLET | Freq: Every day | ORAL | Status: DC
  Administered 2019-06-03: 10 mg via ORAL
  Filled 2019-06-02: qty 1

## 2019-06-02 MED ORDER — BUSPIRONE HCL 15 MG PO TABS
60.0000 mg | ORAL_TABLET | Freq: Every day | ORAL | Status: DC
  Administered 2019-06-03: 60 mg via ORAL
  Filled 2019-06-02: qty 4

## 2019-06-02 MED ORDER — NICOTINE 21 MG/24HR TD PT24
21.0000 mg | MEDICATED_PATCH | Freq: Every day | TRANSDERMAL | Status: DC
  Administered 2019-06-02 – 2019-06-03 (×2): 21 mg via TRANSDERMAL
  Filled 2019-06-02 (×2): qty 1

## 2019-06-02 MED ORDER — TRAZODONE HCL 100 MG PO TABS
200.0000 mg | ORAL_TABLET | Freq: Every day | ORAL | Status: DC
  Administered 2019-06-02: 200 mg via ORAL
  Filled 2019-06-02: qty 2

## 2019-06-02 MED ORDER — LAMOTRIGINE 100 MG PO TABS
200.0000 mg | ORAL_TABLET | Freq: Two times a day (BID) | ORAL | Status: DC
  Administered 2019-06-02 – 2019-06-03 (×2): 200 mg via ORAL
  Filled 2019-06-02 (×2): qty 2

## 2019-06-02 NOTE — Plan of Care (Signed)
Patient  aware of information received , able to verbalize understanding of information received . Emotional and mental status  improved. Limited interaction with unit programing . Voice concerns around sleeping at night . Encourage participation with  group process  to be able to access  resources  availability.  Voice of no safety  concerns . Working on coping and anxiety  issues .    Problem: Self-Concept: Goal: Ability to modify response to factors that promote anxiety will improve 06/02/2019 1238 by Leodis Liverpool, RN Outcome: Not Progressing 06/02/2019 1214 by Leodis Liverpool, RN Outcome: Progressing   Problem: Safety: Goal: Ability to remain free from injury will improve 06/02/2019 1238 by Leodis Liverpool, RN Outcome: Not Progressing 06/02/2019 1214 by Leodis Liverpool, RN Outcome: Progressing   Problem: Physical Regulation: Goal: Complications related to the disease process, condition or treatment will be avoided or minimized 06/02/2019 1238 by Leodis Liverpool, RN Outcome: Not Progressing 06/02/2019 1214 by Leodis Liverpool, RN Outcome: Progressing   Problem: Health Behavior/Discharge Planning: Goal: Ability to identify changes in lifestyle to reduce recurrence of condition will improve 06/02/2019 1238 by Leodis Liverpool, RN Outcome: Not Progressing 06/02/2019 1214 by Leodis Liverpool, RN Outcome: Progressing Goal: Identification of resources available to assist in meeting health care needs will improve 06/02/2019 1238 by Leodis Liverpool, RN Outcome: Not Progressing 06/02/2019 1214 by Leodis Liverpool, RN Outcome: Progressing   Problem: Safety: Goal: Periods of time without injury will increase 06/02/2019 1238 by Leodis Liverpool, RN Outcome: Not Progressing 06/02/2019 1214 by Leodis Liverpool, RN Outcome: Progressing   Problem: Activity: Goal: Interest or engagement in activities will improve 06/02/2019 1238 by Leodis Liverpool, RN Outcome: Not Progressing 06/02/2019 1214 by Leodis Liverpool,  RN Outcome: Progressing Goal: Sleeping patterns will improve 06/02/2019 1238 by Leodis Liverpool, RN Outcome: Not Progressing 06/02/2019 1214 by Leodis Liverpool, RN Outcome: Progressing   Problem: Education: Goal: Knowledge of [place] Education information/materials will improve 06/02/2019 1238 by Leodis Liverpool, RN Outcome: Not Progressing 06/02/2019 1214 by Leodis Liverpool, RN Outcome: Progressing Goal: Emotional status will improve 06/02/2019 1238 by Leodis Liverpool, RN Outcome: Not Progressing 06/02/2019 1214 by Leodis Liverpool, RN Outcome: Progressing Goal: Mental status will improve 06/02/2019 1238 by Leodis Liverpool, RN Outcome: Not Progressing 06/02/2019 1214 by Leodis Liverpool, RN Outcome: Progressing Goal: Verbalization of understanding the information provided will improve 06/02/2019 1238 by Leodis Liverpool, RN Outcome: Not Progressing 06/02/2019 1214 by Leodis Liverpool, RN Outcome: Progressing

## 2019-06-02 NOTE — Progress Notes (Addendum)
D - Patient was in his room upon arrival to the unit. Patient was pleasant during assessment. Patient denies SI/HI/AVH, pain and depression. Patient rated his anxiety 5/10. Patient said he had a rough day. Patient observed interacting appropriately with staff and peers on the unit. Patient stated that his sleeping medications, (See MAR), were not working Midwife. Patient given education.   A - Patient compliant with medication administration per MD orders and procedures on the unit. Patient given education. Patient given support and encouragement to be active in his treatment plan. Patient informed to let staff know if there are any issues or problems on the unit.   R - Patient being monitored Q 15 minutes for safety per unit protocol. Patient remains safe on the unit.

## 2019-06-02 NOTE — [facility] Suicide Risk Assessment (Signed)
[facility] Admission Suicide Risk Assessment   Nursing information obtained from:  Patient Demographic factors:  Male, Caucasian, Living alone Current Mental Status:  NA Loss Factors:  Financial problems / change in socioeconomic status, Legal issues Historical Factors:  NA Risk Reduction Factors:  Religious beliefs about death  Total Time spent with patient: 1 hour Principal Problem: Bipolar affective disorder, depressed, severe ([place]) Diagnosis:  Principal Problem:   Bipolar affective disorder, depressed, severe ([place]) Active Problems:   PTSD (post-traumatic stress disorder)   Alcohol abuse  Subjective Data: Patient seen chart reviewed.  Patient came to the emergency room intoxicated with a report that he had relapsed about a week to 10 days previously and had been drinking and using narcotics began to feel much more depressed felt suicidal took an overdose of trazodone without causing any lasting harm.  On interview today he is sober and detoxed.  Denies suicidal thoughts at all.  Denies psychosis.  Reports his mood is feeling back to normal.  Shows good insight into his situation and problems.  Cooperative with treatment.  No evidence of psychosis or cognitive impairment  Continued Clinical Symptoms:  Alcohol Use Disorder Identification Test Final Score (AUDIT): 33 The "Alcohol Use Disorders Identification Test", Guidelines for Use in Primary Care, Second Edition.  World Pharmacologist [facility]). Score between 0-7:  no or low risk or alcohol related problems. Score between 8-15:  moderate risk of alcohol related problems. Score between 16-19:  high risk of alcohol related problems. Score 20 or above:  warrants further diagnostic evaluation for alcohol dependence and treatment.   CLINICAL FACTORS:   Bipolar Disorder:   Mixed State Alcohol/Substance Abuse/Dependencies   Musculoskeletal: Strength & Muscle Tone: within normal limits Gait & Station: normal Patient leans:  N/A  Psychiatric Specialty Exam: Physical Exam  Nursing note and vitals reviewed. Constitutional: He appears well-developed and well-nourished.  HENT:  Head: Normocephalic and atraumatic.  Eyes: Pupils are equal, round, and reactive to light. Conjunctivae are normal.  Neck: Normal range of motion.  Cardiovascular: Regular rhythm and normal heart sounds.  Respiratory: Effort normal. No respiratory distress.  GI: Soft.  Musculoskeletal: Normal range of motion.  Neurological: He is alert.  Skin: Skin is warm and dry.  Psychiatric: He has a normal mood and affect. His speech is normal and behavior is normal. Judgment and thought content normal. His affect is not blunt. Cognition and memory are normal.    Review of Systems  Constitutional: Negative.   HENT: Negative.   Eyes: Negative.   Respiratory: Negative.   Cardiovascular: Negative.   Gastrointestinal: Negative.   Musculoskeletal: Negative.   Skin: Negative.   Neurological: Negative.   Psychiatric/Behavioral: Negative.     Blood pressure 135/90, pulse 89, temperature 98 F (36.7 C), temperature source Oral, resp. rate 18, height 5\' 11"  (1.803 m), weight 93.9 kg, SpO2 99 %.Body mass index is 28.87 kg/m.  General Appearance: Casual  Eye Contact:  Fair  Speech:  Clear and Coherent  Volume:  Normal  Mood:  Euthymic  Affect:  Congruent  Thought Process:  Goal Directed  Orientation:  Full (Time, Place, and Person)  Thought Content:  Logical  Suicidal Thoughts:  No  Homicidal Thoughts:  No  Memory:  Immediate;   Fair Recent;   Fair Remote;   Fair  Judgement:  Fair  Insight:  Fair  Psychomotor Activity:  Normal  Concentration:  Concentration: Fair  Recall:  [employer] of Knowledge:  Fair  Language:  Fair  Akathisia:  No  Handed:  Right  AIMS (if indicated):     Assets:  Desire for Improvement Housing Resilience Social Support  ADL's:  Intact  Cognition:  WNL  Sleep:  Number of Hours: 5.5      COGNITIVE  FEATURES THAT CONTRIBUTE TO RISK:  None    SUICIDE RISK:   Minimal: No identifiable suicidal ideation.  Patients presenting with no risk factors but with morbid ruminations; may be classified as minimal risk based on the severity of the depressive symptoms  PLAN OF CARE: Patient has outpatient care already set up seeing Dr[facility] here at the hospital and also going to regular AA and substance abuse meetings.  Patient currently stable lucid showing good insight.  Physically stable.  Likely will be ready for discharge tomorrow with plan for outpatient treatment.  No plan necessarily to change any medicine.  I certify that inpatient services furnished can reasonably be expected to improve the patient's condition.   Alethia Berthold, MD 06/02/2019, 1:54 PM

## 2019-06-02 NOTE — Tx Team (Signed)
Interdisciplinary Treatment and Diagnostic Plan Update  06/02/2019 Time of Session: 9am Johnny Villa MRN: [id number]  Principal Diagnosis: <principal problem not specified>  Secondary Diagnoses: Active Problems:   Bipolar affective disorder, depressed, severe ([name])   Current Medications:  Current Facility-Administered Medications  Medication Dose Route Frequency Provider Last Rate Last Dose  . acetaminophen (TYLENOL) tablet 650 mg  650 mg Oral Q6H PRN Patrecia Pour, NP      . alum & mag hydroxide-simeth (MAALOX/MYLANTA) 200-200-20 MG/5ML suspension 30 mL  30 mL Oral Q4H PRN Patrecia Pour, NP      . busPIRone (BUSPAR) tablet 15 mg  15 mg Oral BID Patrecia Pour, NP   15 mg at 06/02/19 0749  . clomiPHENE (CLOMID) tablet 50 mg  50 mg Oral Bess Harvest, NP   50 mg at 06/02/19 0948  . escitalopram (LEXAPRO) tablet 5 mg  5 mg Oral Daily Patrecia Pour, NP   5 mg at 06/02/19 0747  . folic acid (FOLVITE) tablet 1 mg  1 mg Oral Daily Patrecia Pour, NP   1 mg at 06/02/19 0749  . gabapentin (NEURONTIN) capsule 100 mg  100 mg Oral TID Patrecia Pour, NP   100 mg at 06/02/19 0749  . lamoTRIgine (LAMICTAL) tablet 25 mg  25 mg Oral BID Patrecia Pour, NP   25 mg at 06/02/19 0749  . LORazepam (ATIVAN) tablet 1 mg  1 mg Oral Q6H PRN Patrecia Pour, NP   1 mg at 06/01/19 1805   Or  . LORazepam (ATIVAN) injection 1 mg  1 mg Intravenous Q6H PRN Patrecia Pour, NP      . LORazepam (ATIVAN) tablet 0-4 mg  0-4 mg Oral Q6H Patrecia Pour, NP   2 mg at 06/02/19 9470   Followed by  . [START ON 06/03/2019] LORazepam (ATIVAN) tablet 0-4 mg  0-4 mg Oral Q12H Lord, Jamison Y, NP      . magnesium hydroxide (MILK OF MAGNESIA) suspension 30 mL  30 mL Oral Daily PRN Patrecia Pour, NP      . metoCLOPramide (REGLAN) tablet 10 mg  10 mg Oral TID AC & HS Patrecia Pour, NP   10 mg at 06/02/19 0744  . multivitamin with minerals tablet 1 tablet  1 tablet Oral Daily Patrecia Pour, NP   1 tablet at  06/02/19 0749  . nicotine (NICODERM CQ - dosed in mg/24 hours) patch 21 mg  21 mg Transdermal Daily Clapacs, Madie Reno, MD   21 mg at 06/02/19 0745  . ondansetron (ZOFRAN-ODT) disintegrating tablet 4 mg  4 mg Oral Q6H PRN Patrecia Pour, NP   4 mg at 06/02/19 9628  . pantoprazole (PROTONIX) EC tablet 40 mg  40 mg Oral Daily Patrecia Pour, NP   40 mg at 06/02/19 3662  . sildenafil (REVATIO) tablet 40 mg  40 mg Oral Daily PRN Patrecia Pour, NP      . thiamine (VITAMIN B-1) tablet 100 mg  100 mg Oral Daily Patrecia Pour, NP   100 mg at 06/02/19 9476   Or  . thiamine (B-1) injection 100 mg  100 mg Intravenous Daily Patrecia Pour, NP       PTA Medications: Medications Prior to Admission  Medication Sig Dispense Refill Last Dose  . busPIRone (BUSPAR) 30 MG tablet Take 1 tablet (30 mg total) by mouth 2 (two) times daily. 180 tablet 0   . clomiPHENE (  CLOMID) 50 MG tablet Take 1 tablet (50 mg total) by mouth every other day. (Patient taking differently: Take 50 mg by mouth every other day. (or as directed)) 60 tablet 0   . escitalopram (LEXAPRO) 10 MG tablet Take 1 tablet (10 mg total) by mouth daily. 90 tablet 0   . lamoTRIgine (LAMICTAL) 200 MG tablet Take 1 tablet (200 mg total) by mouth daily. 90 tablet 0   . Multiple Vitamins-Minerals (MULTIVITAMIN MEN) TABS Take 1 tablet by mouth daily.     . mupirocin ointment (BACTROBAN) 2 % Apply 1 application topically 2 (two) times daily. (Patient not taking: Reported on 05/31/2019) 30 g 0   . pantoprazole (PROTONIX) 40 MG tablet Take 1 tablet (40 mg total) by mouth daily. 90 tablet 3   . SALINE MIST 0.65 % nasal spray Place 1 application into both nostrils as directed.     . sildenafil (REVATIO) 20 MG tablet Take 2 tablets (40 mg total) by mouth daily. Prn 60 tablet 5   . SUMAtriptan (IMITREX) 100 MG tablet May repeat in 2 hours if not helping. Max dose 200 mg in 1 day. Do not take more than 2x per week 9 tablet 5   . traZODone (DESYREL) 100 MG tablet  Take 2 tablets (200 mg total) by mouth at bedtime. sleep 180 tablet 0   . triamcinolone cream (KENALOG) 0.1 % Apply 1 application topically 2 (two) times daily. To bug bites not face, underarms or private area (Patient not taking: Reported on 05/31/2019) 454 g 0     Patient Stressors: Financial difficulties Legal issue Medication change or noncompliance Substance abuse  Patient Strengths: Ability for insight General fund of knowledge Motivation for treatment/growth Religious Affiliation  Treatment Modalities: Medication Management, Group therapy, Case management,  1 to 1 session with clinician, Psychoeducation, Recreational therapy.   Physician Treatment Plan for Primary Diagnosis: <principal problem not specified> Long Term Goal(s):     Short Term Goals:    Medication Management: Evaluate patient's response, side effects, and tolerance of medication regimen.  Therapeutic Interventions: 1 to 1 sessions, Unit Group sessions and Medication administration.  Evaluation of Outcomes: Not Met  Physician Treatment Plan for Secondary Diagnosis: Active Problems:   Bipolar affective disorder, depressed, severe ([place])  Long Term Goal(s):     Short Term Goals:       Medication Management: Evaluate patient's response, side effects, and tolerance of medication regimen.  Therapeutic Interventions: 1 to 1 sessions, Unit Group sessions and Medication administration.  Evaluation of Outcomes: Not Met   RN Treatment Plan for Primary Diagnosis: <principal problem not specified> Long Term Goal(s): Knowledge of disease and therapeutic regimen to maintain health will improve  Short Term Goals: Ability to participate in decision making will improve, Ability to verbalize feelings will improve, Ability to disclose and discuss suicidal ideas, Ability to identify and develop effective coping behaviors will improve and Compliance with prescribed medications will improve  Medication Management: RN will  administer medications as ordered by provider, will assess and evaluate patient's response and provide education to patient for prescribed medication. RN will report any adverse and/or side effects to prescribing provider.  Therapeutic Interventions: 1 on 1 counseling sessions, Psychoeducation, Medication administration, Evaluate responses to treatment, Monitor vital signs and CBGs as ordered, Perform/monitor CIWA, COWS, AIMS and Fall Risk screenings as ordered, Perform wound care treatments as ordered.  Evaluation of Outcomes: Not Met   LCSW Treatment Plan for Primary Diagnosis: <principal problem not specified> Long Term Goal(s): Safe  transition to appropriate next level of care at discharge, Engage patient in therapeutic group addressing interpersonal concerns.  Short Term Goals: Engage patient in aftercare planning with referrals and resources  Therapeutic Interventions: Assess for all discharge needs, 1 to 1 time with Social worker, Explore available resources and support systems, Assess for adequacy in community support network, Educate family and significant other(s) on suicide prevention, Complete Psychosocial Assessment, Interpersonal group therapy.  Evaluation of Outcomes: Not Met   Progress in Treatment: Attending groups: No. Participating in groups: No. Taking medication as prescribed: Yes. Toleration medication: Yes. Family/Significant other contact made: Yes, individual(s) contacted:  will contact when pt gives consent Patient understands diagnosis: Yes. Discussing patient identified problems/goals with staff: Yes. Medical problems stabilized or resolved: No. Denies suicidal/homicidal ideation: Yes. Issues/concerns per patient self-inventory: No. Other: NA  New problem(s) identified: No, Describe:  None reported  New Short Term/Long Term Goal(s):Attend outpatient treatment, take medication as prescribed, develop and implement healthy coping methods  Patient Goals:   "Get back on medication"  Discharge Plan or Barriers: Pt will return home and follow up at [facility], sees Dr. Shea Evans  Reason for Continuation of Hospitalization: Medication stabilization  Estimated Length of Stay: 3-5 days  Attendees: Patient:Johnny Villa 06/02/2019 11:01 AM  Physician: Alethia Berthold 06/02/2019 11:01 AM  Nursing:  06/02/2019 11:01 AM  RN Care Manager: 06/02/2019 11:01 AM  Social Worker: Sanjuana Kava 06/02/2019 11:01 AM  Recreational Therapist:  06/02/2019 11:01 AM  Other:  06/02/2019 11:01 AM  Other:  06/02/2019 11:01 AM  Other: 06/02/2019 11:01 AM    Scribe for Treatment Team: Yvette Rack, LCSW 06/02/2019 11:01 AM

## 2019-06-02 NOTE — BHH Suicide Risk Assessment (Signed)
[place] INPATIENT:  Family/Significant Other Suicide Prevention Education  Suicide Prevention Education:  Education Completed; Serafina Mitchell, friend [phone] has been identified by the patient as the family member/significant other with whom the patient will be residing, and identified as the person(s) who will aid the patient in the event of a mental health crisis (suicidal ideations/suicide attempt).  With written consent from the patient, the family member/significant other has been provided the following suicide prevention education, prior to the and/or following the discharge of the patient.  The suicide prevention education provided includes the following:  Suicide risk factors  Suicide prevention and interventions  National Suicide Hotline telephone number  [facility] assessment telephone number  [facility] Emergency Assistance [place] and/or Residential Mobile Crisis Unit telephone number  Request made of family/significant other to:  Remove weapons (e.g., guns, rifles, knives), all items previously/currently identified as safety concern.    Remove drugs/medications (over-the-counter, prescriptions, illicit drugs), all items previously/currently identified as a safety concern.  The family member/significant other verbalizes understanding of the suicide prevention education information provided.  The family member/significant other agrees to remove the items of safety concern listed above.  CSW spoke with pts friend, Serafina Mitchell who reported that pt is in the hospital due to being lonely and his depression bothering him and him have suicidal thoughts. Per Candace, she has no concerns with pt being homicidal and denied pt having guns in his home. Candace stated that she is not sure is she has concerns about patient being suicidal due to pt coming in due to [place]. Candace also reported she is unsure if pt is ready to be discharged within the next day or  two due to him having similar presentation last year and going home after a few days and going back downhill.  [place] MSW LCSW 06/02/2019, 11:18 AM

## 2019-06-02 NOTE — Progress Notes (Signed)
D: Patient stated slept good last night .Stated appetite  good and energy level   normal. Stated concentration  good . Stated on Depression scale 0 , hopeless  0 and anxiety 7  .( low 0-10 high) Denies suicidal  homicidal ideations  .  No auditory hallucinations  No pain concerns . Appropriate ADL'S. Interacting with peers and staff. Patient  aware of information received , able to verbalize understanding of information received . Emotional and mental status  improved. Limited interaction with unit programing . Voice concerns around sleeping at night . Encourage participation with  group process  to be able to access  resources  availability.  Voice of no safety  concerns . Working on coping and anxiety  issues .   A: Encourage patient participation with unit programming . Instruction  Given on  Medication , verbalize understanding.  R: Voice no other concerns. Staff continue to monitor

## 2019-06-02 NOTE — H&P (Signed)
Psychiatric Admission Assessment Adult  Patient Identification: Johnny Villa MRN:  [id number] Date of Evaluation:  06/02/2019 Chief Complaint:  BIPOLAR DISORDER Principal Diagnosis: Bipolar affective disorder, depressed, severe ([place]) Diagnosis:  Principal Problem:   Bipolar affective disorder, depressed, severe ([place]) Active Problems:   PTSD (post-traumatic stress disorder)   Alcohol abuse  History of Present Illness: Patient seen chart reviewed.  Patient with a history of chronic mood instability and substance abuse came to the emergency room after having gone about 10 days of relapsing on to heavy alcohol use as well as narcotic use.  Patient says that his mood had been feeling fine as well as he knew and had not been showing any signs of depression or mania when he relapsed about 10 days ago.  At that time he stopped taking his medicine and started drinking heavily.  Also using narcotic pain pills.  He reports that he took a "handful" of trazodone with suicidal ideation before coming to the hospital.  Since coming to the hospital he has been cooperative with treatment.  Has not had a seizure has not shown any sign of delirium.  Alcohol withdrawal symptoms of been easily managed.  Patient has not displayed any dangerous behavior.  He says that he feels like his life is going well.  Cannot identify any particular stressor.  Feels like the medicine that he had been taking had been helpful for him. Associated Signs/Symptoms: Depression Symptoms:  depressed mood, feelings of worthlessness/guilt, suicidal attempt, (Hypo) Manic Symptoms:  Distractibility, Anxiety Symptoms:  Excessive Worry, Psychotic Symptoms:  None reported PTSD Symptoms: Chronic anxiety and social anxiety with a diagnosis of PTSD Total Time spent with patient: 1 hour  Past Psychiatric History: Patient has had previous suicide attempts and hospitalizations.  Has been to rehab before.  Has diagnoses of bipolar disorder and PTSD.   Has been maintained on outpatient medication most recently on Lexapro and lamotrigine BuSpar trazodone as his main psychiatric medicine.  Patient reports that he had had several years of some sustained sobriety before this relapse.  Is the patient at risk to self? Yes.    Has the patient been a risk to self in the past 6 months? Yes.    Has the patient been a risk to self within the distant past? Yes.    Is the patient a risk to others? No.  Has the patient been a risk to others in the past 6 months? No.  Has the patient been a risk to others within the distant past? No.   Prior Inpatient Therapy:   Prior Outpatient Therapy:    Alcohol Screening: 1. How often do you have a drink containing alcohol?: 4 or more times a week 2. How many drinks containing alcohol do you have on a typical day when you are drinking?: 10 or more 3. How often do you have six or more drinks on one occasion?: Weekly AUDIT-C Score: 11 4. How often during the last year have you found that you were not able to stop drinking once you had started?: Daily or almost daily 5. How often during the last year have you failed to do what was normally expected from you becasue of drinking?: Daily or almost daily 6. How often during the last year have you needed a first drink in the morning to get yourself going after a heavy drinking session?: Daily or almost daily 7. How often during the last year have you had a feeling of guilt of remorse after drinking?: Daily  or almost daily 8. How often during the last year have you been unable to remember what happened the night before because you had been drinking?: Monthly 9. Have you or someone else been injured as a result of your drinking?: No 10. Has a relative or friend or a doctor or another health worker been concerned about your drinking or suggested you cut down?: Yes, during the last year Alcohol Use Disorder Identification Test Final Score (AUDIT): 33 Alcohol Brief  Interventions/Follow-up: Alcohol Education, Continued Monitoring, Medication Offered/Prescribed Substance Abuse History in the last 12 months:  Yes.   Consequences of Substance Abuse: Medical Consequences:  Much worse mood with suicidal ideation Previous Psychotropic Medications: Yes  Psychological Evaluations: Yes  Past Medical History:  Past Medical History:  Diagnosis Date  . Bipolar disorder [facility])    psychiatrist in [place]  . Blood in stool   . Depression   . Drug abuse in remission [facility])    sober for 6 years  . Fatty liver 01/2018  . H/O alcohol abuse   . Headache    migraines  . Hypertension   . Low testosterone in male     Past Surgical History:  Procedure Laterality Date  . APPENDECTOMY     2006  . COLONOSCOPY WITH PROPOFOL N/A 12/30/2017   Procedure: COLONOSCOPY WITH PROPOFOL;  Surgeon: Lin Landsman, MD;  Location: [facility] ENDOSCOPY;  Service: Gastroenterology;  Laterality: N/A;  . ESOPHAGOGASTRODUODENOSCOPY (EGD) WITH PROPOFOL N/A 12/30/2017   Procedure: ESOPHAGOGASTRODUODENOSCOPY (EGD) WITH PROPOFOL;  Surgeon: Lin Landsman, MD;  Location: [facility] ENDOSCOPY;  Service: Gastroenterology;  Laterality: N/A;   Family History:  Family History  Problem Relation Age of Onset  . Depression Mother   . Alcohol abuse Father   . Cancer Father        prostate dx'ed 35  . COPD Father   . Hyperlipidemia Father   . Hypertension Father    Family Psychiatric  History: None reported Tobacco Screening:   Social History:  Social History   Substance and Sexual Activity  Alcohol Use No  . Frequency: Never   Comment: former quit 03/14/2012     Social History   Substance and Sexual Activity  Drug Use No   Comment: former quit cocaine 03/14/2012     Additional Social History: Marital status: Other (comment)("dating") What is your sexual orientation?: heterosexual Does patient have children?: No    Pain Medications: see PTA Prescriptions: see PTA Over the Counter: see  PTA History of alcohol / drug use?: Yes Longest period of sobriety (when/how long): 7 years Negative Consequences of Use: Financial, Legal                    Allergies:  No Known Allergies Lab Results: No results found for this or any previous visit (from the past 48 hour(s)).  Blood Alcohol level:  Lab Results  Component Value Date   ETH 278 (H) [id number]    Metabolic Disorder Labs:  Lab Results  Component Value Date   HGBA1C 5.2 10/25/2017   No results found for: PROLACTIN Lab Results  Component Value Date   CHOL 188 10/25/2017   TRIG 158 (H) 10/25/2017   HDL 42 10/25/2017   CHOLHDL 4.5 10/25/2017   LDLCALC 114 (H) 10/25/2017    Current Medications: Current Facility-Administered Medications  Medication Dose Route Frequency Provider Last Rate Last Dose  . acetaminophen (TYLENOL) tablet 650 mg  650 mg Oral Q6H PRN Patrecia Pour, NP      .  alum & mag hydroxide-simeth (MAALOX/MYLANTA) 200-200-20 MG/5ML suspension 30 mL  30 mL Oral Q4H PRN Patrecia Pour, NP      . Derrill Memo ON 06/03/2019] busPIRone (BUSPAR) tablet 60 mg  60 mg Oral Daily Clapacs, John T, MD      . clomiPHENE (CLOMID) tablet 50 mg  50 mg Oral Bess Harvest, NP   50 mg at 06/02/19 0948  . [START ON 06/03/2019] escitalopram (LEXAPRO) tablet 10 mg  10 mg Oral Daily Clapacs, John T, MD      . folic acid (FOLVITE) tablet 1 mg  1 mg Oral Daily Patrecia Pour, NP   1 mg at 06/02/19 0749  . lamoTRIgine (LAMICTAL) tablet 200 mg  200 mg Oral BID Clapacs, John T, MD      . magnesium hydroxide (MILK OF MAGNESIA) suspension 30 mL  30 mL Oral Daily PRN Patrecia Pour, NP      . multivitamin with minerals tablet 1 tablet  1 tablet Oral Daily Patrecia Pour, NP   1 tablet at 06/02/19 0749  . nicotine (NICODERM CQ - dosed in mg/24 hours) patch 21 mg  21 mg Transdermal Daily Clapacs, Madie Reno, MD   21 mg at 06/02/19 0745  . ondansetron (ZOFRAN-ODT) disintegrating tablet 4 mg  4 mg Oral Q6H PRN Patrecia Pour, NP    4 mg at 06/02/19 [id number]  . pantoprazole (PROTONIX) EC tablet 40 mg  40 mg Oral Daily Patrecia Pour, NP   40 mg at 06/02/19 [id number]  . thiamine (VITAMIN B-1) tablet 100 mg  100 mg Oral Daily Patrecia Pour, NP   100 mg at 06/02/19 [id number]   Or  . thiamine (B-1) injection 100 mg  100 mg Intravenous Daily Patrecia Pour, NP      . traZODone (DESYREL) tablet 200 mg  200 mg Oral QHS Clapacs, Madie Reno, MD       PTA Medications: Medications Prior to Admission  Medication Sig Dispense Refill Last Dose  . busPIRone (BUSPAR) 30 MG tablet Take 1 tablet (30 mg total) by mouth 2 (two) times daily. 180 tablet 0 Past Week at Unknown time  . clomiPHENE (CLOMID) 50 MG tablet Take 1 tablet (50 mg total) by mouth every other day. (Patient taking differently: Take 50 mg by mouth every other day. (or as directed)) 60 tablet 0 Past Week at Unknown time  . escitalopram (LEXAPRO) 10 MG tablet Take 1 tablet (10 mg total) by mouth daily. 90 tablet 0 Past Week at Unknown time  . lamoTRIgine (LAMICTAL) 200 MG tablet Take 1 tablet (200 mg total) by mouth daily. 90 tablet 0 Past Week at Unknown time  . Multiple Vitamins-Minerals (MULTIVITAMIN MEN) TABS Take 1 tablet by mouth daily.   Past Week at Unknown time  . pantoprazole (PROTONIX) 40 MG tablet Take 1 tablet (40 mg total) by mouth daily. 90 tablet 3 Past Week at Unknown time  . SALINE MIST 0.65 % nasal spray Place 1 application into both nostrils as directed.   Past Week at Unknown time  . sildenafil (REVATIO) 20 MG tablet Take 2 tablets (40 mg total) by mouth daily. Prn 60 tablet 5 Past Week at Unknown time  . SUMAtriptan (IMITREX) 100 MG tablet May repeat in 2 hours if not helping. Max dose 200 mg in 1 day. Do not take more than 2x per week 9 tablet 5 Past Week at Unknown time  . traZODone (DESYREL) 100 MG tablet Take 2  tablets (200 mg total) by mouth at bedtime. sleep 180 tablet 0 Past Week at Unknown time    Musculoskeletal: Strength & Muscle Tone: within normal  limits Gait & Station: normal Patient leans: N/A  Psychiatric Specialty Exam: Physical Exam  Nursing note and vitals reviewed. Constitutional: He appears well-developed and well-nourished.  HENT:  Head: Normocephalic and atraumatic.  Eyes: Pupils are equal, round, and reactive to light. Conjunctivae are normal.  Neck: Normal range of motion.  Cardiovascular: Regular rhythm and normal heart sounds.  Respiratory: Effort normal.  GI: Soft.  Musculoskeletal: Normal range of motion.  Neurological: He is alert.  Skin: Skin is warm and dry.  Psychiatric: He has a normal mood and affect. His speech is normal and behavior is normal. Judgment and thought content normal. Cognition and memory are normal.    Review of Systems  Constitutional: Negative.   HENT: Negative.   Eyes: Negative.   Respiratory: Negative.   Cardiovascular: Negative.   Gastrointestinal: Negative.   Musculoskeletal: Negative.   Skin: Negative.   Neurological: Negative.   Psychiatric/Behavioral: Negative.     Blood pressure 135/90, pulse 89, temperature 98 F (36.7 C), temperature source Oral, resp. rate 18, height 5\' 11"  (1.803 m), weight 93.9 kg, SpO2 99 %.Body mass index is 28.87 kg/m.  General Appearance: Fairly Groomed  Eye Contact:  Good  Speech:  Clear and Coherent  Volume:  Normal  Mood:  Euthymic  Affect:  Congruent  Thought Process:  Goal Directed  Orientation:  Full (Time, Place, and Person)  Thought Content:  Logical  Suicidal Thoughts:  No  Homicidal Thoughts:  No  Memory:  Immediate;   Fair Recent;   Fair Remote;   Fair  Judgement:  Fair  Insight:  Fair  Psychomotor Activity:  Normal  Concentration:  Concentration: Fair  Recall:  [employer] of Knowledge:  Fair  Language:  Fair  Akathisia:  No  Handed:  Right  AIMS (if indicated):     Assets:  Desire for Improvement  ADL's:  Intact  Cognition:  WNL  Sleep:  Number of Hours: 5.5    Treatment Plan Summary: Daily contact with patient  to assess and evaluate symptoms and progress in treatment, Medication management and Plan Given that the patient is not reporting any clear symptoms of mood disorder prior to her relapse and that the admission seems to have mainly been driven by relapse and the symptoms coincident with alcohol abuse and the fact that he seems to of bounce back into a very stable mood I do not think there is any clear reason to change his medicine.  He has good outpatient follow-up.  I am returning him to the Lamictal lamotrigine and BuSpar and trazodone doses that he is quoted in the past.  Patient does not appear to need any further treatment for alcohol withdrawal.  Reevaluate tomorrow and consider possible discharge at that time as he has outpatient treatment in place.  Observation Level/Precautions:  15 minute checks  Laboratory:  UDS  Psychotherapy:    Medications:    Consultations:    Discharge Concerns:    Estimated LOS:  Other:     Physician Treatment Plan for Primary Diagnosis: Bipolar affective disorder, depressed, severe ([place]) Long Term Goal(s): Improvement in symptoms so as ready for discharge  Short Term Goals: Ability to disclose and discuss suicidal ideas, Ability to demonstrate self-control will improve and Ability to identify and develop effective coping behaviors will improve  Physician Treatment Plan for Secondary  Diagnosis: Principal Problem:   Bipolar affective disorder, depressed, severe ([place]) Active Problems:   PTSD (post-traumatic stress disorder)   Alcohol abuse  Long Term Goal(s): Improvement in symptoms so as ready for discharge  Short Term Goals: Compliance with prescribed medications will improve and Ability to identify triggers associated with substance abuse/mental health issues will improve  I certify that inpatient services furnished can reasonably be expected to improve the patient's condition.    Alethia Berthold, MD 9/1/20201:58 PM

## 2019-06-02 NOTE — Plan of Care (Signed)
Patient pleasant and compliant with medications and procedures on the unit.   Problem: Education: Goal: Emotional status will improve Outcome: Progressing Goal: Mental status will improve Outcome: Progressing

## 2019-06-02 NOTE — BHH Group Notes (Signed)
  LCSW Group Therapy Note  06/02/2019 12:18 PM   Type of Therapy/Topic:  Group Therapy:  Feelings about Diagnosis  Participation Level:  Active   Description of Group:   This group will allow patients to explore their thoughts and feelings about diagnoses they have received. Patients will be guided to explore their level of understanding and acceptance of these diagnoses. Facilitator will encourage patients to process their thoughts and feelings about the reactions of others to their diagnosis and will guide patients in identifying ways to discuss their diagnosis with significant others in their lives. This group will be process-oriented, with patients participating in exploration of their own experiences, giving and receiving support, and processing challenge from other group members.   Therapeutic Goals: 1. Patient will demonstrate understanding of diagnosis as evidenced by identifying two or more symptoms of the disorder 2. Patient will be able to express two feelings regarding the diagnosis 3. Patient will demonstrate their ability to communicate their needs through discussion and/or role play  Summary of Patient Progress: Pt was appropriate and respectful in group. Pt was able to identify his diagnoses as Bipolar I and PTSD. 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 Pt reported society viewing his bipolar diagnosis negatively and people not wanting to be around him even when he is stable, which caused him to isolate himself and have social anxiety. Pt identified running, reading, podcasts, and guided meditation as coping skills he uses.   Therapeutic Modalities:   Cognitive Behavioral Therapy Brief Therapy Feelings Identification    Evalina Field, MSW, LCSW Clinical Social Work 06/02/2019 12:18 PM
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
## 2019-06-02 NOTE — Progress Notes (Signed)
Recreation Therapy Notes  Date: 06/02/2019  Time: 9:30 am  Location: Craft room  Behavioral response: Appropriate   Intervention Topic: Necessities   Discussion/Intervention:  Group content on today was focused on necessities. The group defined necessities and how they determine their necessities. Individuals expressed how many necessities they have and if it changes from day to day. Patients described the difference between wants and needs. The group explained how they have overspent on wants in the past. Individuals described a reoccurring necessity for them. The intervention "What I need" helped patients differentiate between wants and needs.  Clinical Observations/Feedback:  Patient came to group late due to unknown reasons. Individual was social with peers and staff while participating in the intervention.    Catherin Doorn LRT/CTRS          Tameisha Covell 06/02/2019 11:08 AM

## 2019-06-03 MED ORDER — ONDANSETRON 4 MG PO TBDP: 4.0000 mg | ORAL_TABLET | Freq: Four times a day (QID) | ORAL | 0 refills | Status: DC | PRN

## 2019-06-03 MED ORDER — HYDROXYZINE HCL 50 MG PO TABS
50.0000 mg | ORAL_TABLET | Freq: Once | ORAL | Status: AC
  Administered 2019-06-03: 50 mg via ORAL
  Filled 2019-06-03: qty 1

## 2019-06-03 MED ORDER — TRAZODONE HCL 100 MG PO TABS: 200.0000 mg | ORAL_TABLET | Freq: Every day | ORAL | 0 refills | Status: DC

## 2019-06-03 MED ORDER — PANTOPRAZOLE SODIUM 40 MG PO TBEC: 40.0000 mg | DELAYED_RELEASE_TABLET | Freq: Every day | ORAL | 0 refills | Status: DC

## 2019-06-03 MED ORDER — BUSPIRONE HCL 30 MG PO TABS: 60.0000 mg | ORAL_TABLET | Freq: Every day | ORAL | 0 refills | Status: DC

## 2019-06-03 MED ORDER — ESCITALOPRAM OXALATE 10 MG PO TABS: 10.0000 mg | ORAL_TABLET | Freq: Every day | ORAL | 0 refills | Status: DC

## 2019-06-03 MED ORDER — LAMOTRIGINE 200 MG PO TABS: 200.0000 mg | ORAL_TABLET | Freq: Every day | ORAL | 0 refills | Status: DC

## 2019-06-03 NOTE — Plan of Care (Signed)
  Problem: Education: Goal: Knowledge of [facility] Education information/materials will improve Outcome: Adequate for Discharge Goal: Emotional status will improve Outcome: Adequate for Discharge Goal: Mental status will improve Outcome: Adequate for Discharge Goal: Verbalization of understanding the information provided will improve Outcome: Adequate for Discharge   Problem: Activity: Goal: Interest or engagement in activities will improve Outcome: Adequate for Discharge Goal: Sleeping patterns will improve Outcome: Adequate for Discharge   Problem: Safety: Goal: Periods of time without injury will increase Outcome: Adequate for Discharge   Problem: Health Behavior/Discharge Planning: Goal: Ability to identify changes in lifestyle to reduce recurrence of condition will improve Outcome: Adequate for Discharge Goal: Identification of resources available to assist in meeting health care needs will improve Outcome: Adequate for Discharge   Problem: Physical Regulation: Goal: Complications related to the disease process, condition or treatment will be avoided or minimized Outcome: Adequate for Discharge   Problem: Safety: Goal: Ability to remain free from injury will improve Outcome: Adequate for Discharge   Problem: Self-Concept: Goal: Ability to modify response to factors that promote anxiety will improve Outcome: Adequate for Discharge

## 2019-06-03 NOTE — Progress Notes (Signed)
D: Patient is aware of  Discharge this shift .  A:Patient denies suicidal /homicidal ideations. Patient received all belongings brought in   No Storage medications. Writer reviewed Discharge Summary, Suicide Risk Assessment, and Transitional Record. Patient also received Prescriptions   from  MD. Laqueta Due  Of follow up appointment .  R: Patient left unit with no questions  Or concerns by taxi

## 2019-06-03 NOTE — B[facility] Suicide Risk Assessment (Signed)
[facility] Discharge Suicide Risk Assessment   Principal Problem: Bipolar affective disorder, depressed, severe ([place]) Discharge Diagnoses: Principal Problem:   Bipolar affective disorder, depressed, severe ([place]) Active Problems:   PTSD (post-traumatic stress disorder)   Alcohol abuse   Total Time spent with patient: 45 minutes  Musculoskeletal: Strength & Muscle Tone: within normal limits Gait & Station: normal Patient leans: N/A  Psychiatric Specialty Exam: Review of Systems  Constitutional: Negative.   HENT: Negative.   Eyes: Negative.   Respiratory: Negative.   Cardiovascular: Negative.   Gastrointestinal: Negative.   Musculoskeletal: Negative.   Skin: Negative.   Neurological: Negative.   Psychiatric/Behavioral: Negative.     Blood pressure (!) 155/90, pulse 72, temperature 98.6 F (37 C), temperature source Oral, resp. rate 17, height 5\' 11"  (1.803 m), weight 93.9 kg, SpO2 100 %.Body mass index is 28.87 kg/m.  General Appearance: Fairly Groomed  Engineer, water::  Good  Speech:  Normal Rate409  Volume:  Normal  Mood:  Euthymic  Affect:  Congruent  Thought Process:  Goal Directed  Orientation:  Full (Time, Place, and Person)  Thought Content:  Logical  Suicidal Thoughts:  No  Homicidal Thoughts:  No  Memory:  Immediate;   Fair Recent;   Fair Remote;   Fair  Judgement:  Fair  Insight:  Fair  Psychomotor Activity:  Normal  Concentration:  Fair  Recall:  [employer] of [place]  Language: Fair  Akathisia:  No  Handed:  Right  AIMS (if indicated):     Assets:  Desire for Improvement Housing Physical Health Resilience Social Support  Sleep:  Number of Hours: 5.5  Cognition: WNL  ADL's:  Intact   Mental Status Per Nursing Assessment::   On Admission:  NA  Demographic Factors:  Male, Living alone and Unemployed  Loss Factors: NA  Historical Factors: Impulsivity  Risk Reduction Factors:   Positive social support and Positive therapeutic  relationship  Continued Clinical Symptoms:  Bipolar Disorder:   Mixed State Alcohol/Substance Abuse/Dependencies  Cognitive Features That Contribute To Risk:  None    Suicide Risk:  Minimal: No identifiable suicidal ideation.  Patients presenting with no risk factors but with morbid ruminations; may be classified as minimal risk based on the severity of the depressive symptoms  Follow-up [facility] Follow up on 06/16/2020.   Specialty: Behavioral Health Why: You are scheduled for a virtual psychiatry appointment with Dr. Shea Evans on Thursday, September 16th at 8:30am and a telephone therapy appointment on September 22nd at [place] with Merleen Nicely. Contact information: [place] [phone]          Plan Of Care/Follow-up recommendations:  Activity:  Activity as tolerated Diet:  Regular diet Other:  Follow-up with outpatient treatment with usual psychiatrist and resume active participation in 12-step groups.  Alethia Berthold, MD 06/03/2019, 8:51 AM

## 2019-06-03 NOTE — Progress Notes (Signed)
  [facility] Adult Case Management Discharge Plan :  Will you be returning to the same living situation after discharge:  Yes,  lives alone At discharge, do you have transportation home?: Yes,  provided with taxi voucher Do you have the ability to pay for your medications: Yes,  insurance  Release of information consent forms completed and in the chart;  Patient's signature needed at discharge.  Patient to Follow up at: Follow-up Information    [facility] Follow up on 06/16/2020.   Specialty: Behavioral Health Why: You are scheduled for a virtual psychiatry appointment with Dr. Shea Evans on Thursday, September 16th at 8:30am and a telephone therapy appointment on September 22nd at [facility] with Merleen Nicely. Contact information: [place] [address] [place] [phone]          Next level of care provider has access to [place] and Suicide Prevention discussed: Yes,  Serafina Mitchell, friend     Has patient been referred to the Quitline?: N/A patient is not a smoker  Patient has been referred for addiction treatment: N/A  Yvette Rack, LCSW 06/03/2019, 8:59 AM

## 2019-06-03 NOTE — Progress Notes (Signed)
Recreation Therapy Notes  Date: 06/03/2019  Time: 9:30 am  Location: Craft room  Behavioral response: Appropriate   Intervention Topic: Goals  Discussion/Intervention:  Group content on today was focused on goals. Patients described what goals are and how they define goals. Individuals expressed how they go about setting goals and reaching them. The group identified how important goals are and if they make short term goals to reach long term goals. Patients described how many goals they work on at a time and what affects them not reaching their goal. Individuals described how much time they put into planning and obtaining their goals. The group participated in the intervention "My Goal Board" and made personal goal boards to help them achieve their goal Clinical Observations/Feedback:  Patient came to group and stated that his goals are to go to at least one [place] today and go food shopping. He explained that he sets goals for himself because he know how good life is and he knows how happy he has been while clean and on his medication. Participant expressed that sometimes he sets goals to high and gets depressed when he does not reach them. Patient compared goals to a puzzle he stated "you just have to take it one piece at a time." Individual was social with peers and staff while participating in group.  Anaika Santillano LRT/CTRS          Shaquayla Klimas 06/03/2019 11:20 AM

## 2019-06-03 NOTE — Discharge Summary (Signed)
Physician Discharge Summary Note  Patient:  Johnny Villa is an 43 y.o., male MRN:  [id number] DOB:  Jun 03, 1976 Patient phone:  [phone] (home)  Patient address:   [place] [address],  Total Time spent with patient: 1 hour  Date of Admission:  06/01/2019 Date of Discharge: 06/03/2019  Reason for Admission: Admitted through the emergency room where he had presented with an overdose of trazodone during a spell of agitated depression related to relapse  Principal Problem: Bipolar affective disorder, depressed, severe ([place]) Discharge Diagnoses: Principal Problem:   Bipolar affective disorder, depressed, severe ([place]) Active Problems:   PTSD (post-traumatic stress disorder)   Alcohol abuse   Past Psychiatric History: History of bipolar disorder PTSD and substance abuse  Past Medical History:  Past Medical History:  Diagnosis Date  . Bipolar disorder [facility])    psychiatrist in [place]  . Blood in stool   . Depression   . Drug abuse in remission [facility])    sober for 6 years  . Fatty liver 01/2018  . H/O alcohol abuse   . Headache    migraines  . Hypertension   . Low testosterone in male     Past Surgical History:  Procedure Laterality Date  . APPENDECTOMY     2006  . COLONOSCOPY WITH PROPOFOL N/A 12/30/2017   Procedure: COLONOSCOPY WITH PROPOFOL;  Surgeon: Lin Landsman, MD;  Location: [facility] ENDOSCOPY;  Service: Gastroenterology;  Laterality: N/A;  . ESOPHAGOGASTRODUODENOSCOPY (EGD) WITH PROPOFOL N/A 12/30/2017   Procedure: ESOPHAGOGASTRODUODENOSCOPY (EGD) WITH PROPOFOL;  Surgeon: Lin Landsman, MD;  Location: [facility] ENDOSCOPY;  Service: Gastroenterology;  Laterality: N/A;   Family History:  Family History  Problem Relation Age of Onset  . Depression Mother   . Alcohol abuse Father   . Cancer Father        prostate dx'ed 68  . COPD Father   . Hyperlipidemia Father   . Hypertension Father    Family Psychiatric  History: Substance abuse Social  History:  Social History   Substance and Sexual Activity  Alcohol Use No  . Frequency: Never   Comment: former quit 03/14/2012     Social History   Substance and Sexual Activity  Drug Use No   Comment: former quit cocaine 03/14/2012     Social History   Socioeconomic History  . Marital status: Single    Spouse name: Not on file  . Number of children: Not on file  . Years of education: Not on file  . Highest education level: Not on file  Occupational History  . Occupation: disabled  Social Needs  . Financial resource strain: Not hard at all  . Food insecurity    Worry: Patient refused    Inability: Patient refused  . Transportation needs    Medical: Patient refused    Non-medical: Patient refused  Tobacco Use  . Smoking status: Former Smoker    Types: Cigarettes    Quit date: 2020    Years since quitting: 0.6  . Smokeless tobacco: Never Used  Substance and Sexual Activity  . Alcohol use: No    Frequency: Never    Comment: former quit 03/14/2012  . Drug use: No    Comment: former quit cocaine 03/14/2012   . Sexual activity: Yes  Lifestyle  . Physical activity    Days per week: Patient refused    Minutes per session: Patient refused  . Stress: Not on file  Relationships  . Social connections  Talks on phone: Patient refused    Gets together: Patient refused    Attends religious service: Patient refused    Active member of club or organization: Patient refused    Attends meetings of clubs or organizations: Patient refused    Relationship status: Patient refused  Other Topics Concern  . Not on file  Social History Narrative   No kids   [facility] Course: Patient admitted to the psychiatry ward 15-minute checks employed.  Patient was cooperative with treatment planning and assessment.  He did not display any dangerous aggressive or suicidal behavior during his time in the hospital.  Patient was restarted on medication based on his previous  prescriptions from his outpatient psychiatrist.  He met with treatment team.  Affect and mood return to normal fairly quickly once he had detoxed.  He showed good insight.  Affect was calm and euthymic and he was very focused on getting back into treatment maintaining sobriety and continuing his psychiatric medicine.  No longer met commitment criteria.  Patient discharged home with follow-up with his usual psychiatrist and substance abuse treatment.  Psychoeducation and review of medications complete  Physical Findings: AIMS:  , ,  ,  ,    CIWA:  CIWA-Ar Total: 0 COWS:     Musculoskeletal: Strength & Muscle Tone: within normal limits Gait & Station: normal Patient leans: N/A  Psychiatric Specialty Exam: Physical Exam  Nursing note and vitals reviewed. Constitutional: He appears well-developed and well-nourished.  HENT:  Head: Normocephalic and atraumatic.  Eyes: Pupils are equal, round, and reactive to light. Conjunctivae are normal.  Neck: Normal range of motion.  Cardiovascular: Regular rhythm and normal heart sounds.  Respiratory: Effort normal.  GI: Soft.  Musculoskeletal: Normal range of motion.  Neurological: He is alert.  Skin: Skin is warm and dry.  Psychiatric: He has a normal mood and affect. His behavior is normal. Judgment and thought content normal.    Review of Systems  Constitutional: Negative.   HENT: Negative.   Eyes: Negative.   Respiratory: Negative.   Cardiovascular: Negative.   Gastrointestinal: Negative.   Musculoskeletal: Negative.   Skin: Negative.   Neurological: Negative.   Psychiatric/Behavioral: Negative.     Blood pressure (!) 155/90, pulse 72, temperature 98.6 F (37 C), temperature source Oral, resp. rate 17, height _0  (1.803 m), weight 93.9 kg, SpO2 100 %.Body mass index is 28.87 kg/m.  General Appearance: Casual  Eye Contact:  Good  Speech:  Clear and Coherent  Volume:  Normal  Mood:  Euthymic  Affect:  Congruent  Thought Process:   Goal Directed  Orientation:  Full (Time, Place, and Person)  Thought Content:  Logical  Suicidal Thoughts:  No  Homicidal Thoughts:  No  Memory:  Immediate;   Fair Recent;   Fair Remote;   Fair  Judgement:  Fair  Insight:  Fair  Psychomotor Activity:  Normal  Concentration:  Concentration: Fair  Recall:  [employer] of Knowledge:  Fair  Language:  Fair  Akathisia:  No  Handed:  Right  AIMS (if indicated):     Assets:  Communication Skills Desire for Improvement Financial Resources/Insurance Housing Physical Health Resilience Social Support  ADL's:  Intact  Cognition:  WNL  Sleep:  Number of Hours: 5.5        Has this patient used any form of tobacco in the last 30 days? (Cigarettes, Smokeless Tobacco, Cigars, and/or Pipes) Yes, No  Blood Alcohol  level:  Lab Results  Component Value Date   ETH 278 (H) 04/54/0981    Metabolic Disorder Labs:  Lab Results  Component Value Date   HGBA1C 5.2 10/25/2017   No results found for: PROLACTIN Lab Results  Component Value Date   CHOL 188 10/25/2017   TRIG 158 (H) 10/25/2017   HDL 42 10/25/2017   CHOLHDL 4.5 10/25/2017   LDLCALC 114 (H) 10/25/2017    See Psychiatric Specialty Exam and Suicide Risk Assessment completed by Attending Physician prior to discharge.  Discharge destination:  Home  Is patient on multiple antipsychotic therapies at discharge:  No   Has Patient had three or more failed trials of antipsychotic monotherapy by history:  No  Recommended Plan for Multiple Antipsychotic Therapies: NA  Discharge Instructions    Diet - low sodium heart healthy   Complete by: As directed    Increase activity slowly   Complete by: As directed      Allergies as of 06/03/2019   No Known Allergies     Medication List    STOP taking these medications   Multivitamin Men Tabs   SALINE MIST 0.65 % nasal spray Generic drug: sodium chloride     TAKE these medications     Indication  busPIRone 30 MG  tablet Commonly known as: BUSPAR Take 2 tablets (60 mg total) by mouth daily. Start taking on: June 04, 2019 What changed:   how much to take  when to take this  Indication: Anxiety Disorder, Major Depressive Disorder   clomiPHENE 50 MG tablet Commonly known as: CLOMID Take 1 tablet (50 mg total) by mouth every other day. What changed: additional instructions  Indication: Enlarged Breasts in Males   escitalopram 10 MG tablet Commonly known as: LEXAPRO Take 1 tablet (10 mg total) by mouth daily.  Indication: Generalized Anxiety Disorder, Major Depressive Disorder   lamoTRIgine 200 MG tablet Commonly known as: LAMICTAL Take 1 tablet (200 mg total) by mouth daily.  Indication: Depressive Phase of Manic-Depression   ondansetron 4 MG disintegrating tablet Commonly known as: ZOFRAN-ODT Take 1 tablet (4 mg total) by mouth every 6 (six) hours as needed for nausea or vomiting.  Indication: Nausea and Vomiting   pantoprazole 40 MG tablet Commonly known as: Protonix Take 1 tablet (40 mg total) by mouth daily.  Indication: Gastroesophageal Reflux Disease   sildenafil 20 MG tablet Commonly known as: REVATIO Take 2 tablets (40 mg total) by mouth daily. Prn  Indication: Raynaud's Disease   SUMAtriptan 100 MG tablet Commonly known as: IMITREX May repeat in 2 hours if not helping. Max dose 200 mg in 1 day. Do not take more than 2x per week  Indication: Migraine Headache   traZODone 100 MG tablet Commonly known as: DESYREL Take 2 tablets (200 mg total) by mouth at bedtime. What changed: additional instructions  Indication: [place] Follow up on 06/16/2020.   Specialty: Behavioral Health Why: You are scheduled for a virtual psychiatry appointment with Dr. Shea Evans on Thursday, September 16th at 8:30am and a telephone therapy appointment on September 22nd at [facility] with Merleen Nicely. Contact  information: [place] [phone]          Follow-up recommendations:  Activity:  Activity as tolerated Diet:  Regular diet Other:  Follow-up with outpatient treatment as directed.  Comments: Patient's affect and behavior calm appropriate and lucid.  Agrees to outpatient plan.  Signed: Alethia Berthold, MD 06/03/2019, 9:10 AM

## 2019-06-04 DIAGNOSIS — R7989 Other specified abnormal findings of blood chemistry: Secondary | ICD-10-CM | POA: Diagnosis not present

## 2019-06-17 ENCOUNTER — Other Ambulatory Visit: Payer: Self-pay

## 2019-06-17 ENCOUNTER — Ambulatory Visit (INDEPENDENT_AMBULATORY_CARE_PROVIDER_SITE_OTHER): Payer: Medicare PPO | Admitting: Psychiatry

## 2019-06-17 ENCOUNTER — Encounter: Payer: Self-pay | Admitting: Psychiatry

## 2019-06-17 DIAGNOSIS — F4310 Post-traumatic stress disorder, unspecified: Secondary | ICD-10-CM | POA: Diagnosis not present

## 2019-06-17 DIAGNOSIS — F3130 Bipolar disorder, current episode depressed, mild or moderate severity, unspecified: Secondary | ICD-10-CM | POA: Diagnosis not present

## 2019-06-17 DIAGNOSIS — F1020 Alcohol dependence, uncomplicated: Secondary | ICD-10-CM | POA: Diagnosis not present

## 2019-06-17 DIAGNOSIS — F1421 Cocaine dependence, in remission: Secondary | ICD-10-CM

## 2019-06-17 MED ORDER — ZOLPIDEM TARTRATE 5 MG PO TABS: 5.0000 mg | ORAL_TABLET | Freq: Every evening | ORAL | 1 refills | Status: DC | PRN

## 2019-06-17 NOTE — Progress Notes (Signed)
Virtual Visit via Video Note  I connected with Johnny Villa on 06/17/19 at  8:30 AM EDT by a video enabled telemedicine application and verified that I am speaking with the correct person using two identifiers.   I discussed the limitations of evaluation and management by telemedicine and the availability of in person appointments. The patient expressed understanding and agreed to proceed.  I discussed the assessment and treatment plan with the patient. The patient was provided an opportunity to ask questions and all were answered. The patient agreed with the plan and demonstrated an understanding of the instructions.   The patient was advised to call back or seek an in-person evaluation if the symptoms worsen or if the condition fails to improve as anticipated.   [place] MD OP Progress Note  06/17/2019 12:42 PM Johnny Villa  MRN:  [id number]  Chief Complaint:  Chief Complaint    Follow-up     HPI: Johnny Villa is a 43 year old male, currently lives in [place], on disability, has a history of bipolar disorder, PTSD, panic attacks, alcohol use disorder in remission, migraine headaches, cocaine use disorder in remission was evaluated by telemedicine today.  Patient was recently admitted to inpatient behavioral health unit on 8/29 and discharged on 06/01/2019 after a suicide attempt by overdosing on trazodone.  I have reviewed medical records in [facility] per Dr. Weber Cooks dated 8/29 -  06/01/2019-' admitted through the emergency room where he had presented with an overdose of trazodone during a spell of agitated depression related to relapse.  Patient was restarted on medications.  His affect and mood returned to normal fairly quickly once he had detoxed.  Patient was discharged on lamotrigine, Lexapro and trazodone.  Patient advised to follow-up with outpatient provider.'  Patient today reports he is currently doing well.  He reports that he has observed himself as having worsening mood symptoms around  the end of August every year.  He reports he goes into rapid cycling around that time.  This time he had suicidal thoughts and he overdosed on trazodone however asked for help and his friend took him to the emergency department.  He reports he is currently stable.  He denies any significant mood swings.  He denies any perceptual disturbances.  He reports he continues to struggle with sleep.  He reports that trazodone was not helpful.  Discussed starting Ambien.  He has tried it in the past and reports it is helpful.  Patient denies any substance abuse problems.  He reports his family is doing well and he spent some time with his grandmother and mother and brother recently in [place] for his grandmothers birthday.  He enjoyed his time.  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  He denies any other concerns today. Visit Diagnosis:    ICD-10-CM   1. PTSD (post-traumatic stress disorder)  F43.10   2. Bipolar I disorder, most recent episode depressed ([place])  F31.30 zolpidem (AMBIEN) 5 MG tablet  3. Alcohol use disorder, moderate, dependence ([name])  F10.20   4. Cocaine use disorder, moderate, in sustained remission ([place])  F14.21     Past Psychiatric History: I have reviewed past psychiatric history from my progress note on 05/04/2019.  Past trials of clonazepam, Lexapro, Vraylar, Rexulti, Wellbutrin, lamotrigine, lithium, BuSpar  Past Medical History:  Past Medical History:  Diagnosis Date  . Bipolar disorder [facility])    psychiatrist in [place]  . Blood in  stool   . Depression   . Drug abuse in remission [facility])    sober for 6 years  . Fatty liver 01/2018  . H/O alcohol abuse   . Headache    migraines  . Hypertension   . Low testosterone in male     Past Surgical History:  Procedure Laterality Date  . APPENDECTOMY     2006  . COLONOSCOPY WITH PROPOFOL N/A 12/30/2017   Procedure: COLONOSCOPY WITH PROPOFOL;  Surgeon: Lin Landsman, MD;  Location: [facility] ENDOSCOPY;  Service: Gastroenterology;  Laterality: N/A;  . ESOPHAGOGASTRODUODENOSCOPY (EGD) WITH PROPOFOL N/A 12/30/2017   Procedure: ESOPHAGOGASTRODUODENOSCOPY (EGD) WITH PROPOFOL;  Surgeon: Lin Landsman, MD;  Location: [facility] ENDOSCOPY;  Service: Gastroenterology;  Laterality: N/A;    Family Psychiatric History: I have reviewed family psychiatric history from my progress note on 05/04/2019  Family History:  Family History  Problem Relation Age of Onset  . Depression Mother   . Alcohol abuse Father   . Cancer Father        prostate dx'ed 78  . COPD Father   . Hyperlipidemia Father   . Hypertension Father     Social History: I have reviewed social history from my progress note on 05/04/2019 Social History   Socioeconomic History  . Marital status: Single    Spouse name: Not on file  . Number of children: Not on file  . Years of education: Not on file  . Highest education level: Not on file  Occupational History  . Occupation: disabled  Social Needs  . Financial resource strain: Not hard at all  . Food insecurity    Worry: Patient refused    Inability: Patient refused  . Transportation needs    Medical: Patient refused    Non-medical: Patient refused  Tobacco Use  . Smoking status: Former Smoker    Types: Cigarettes    Quit date: 2020    Years since quitting: 0.7  . Smokeless tobacco: Never Used  Substance and Sexual Activity  . Alcohol use: No    Frequency: Never    Comment: former quit 03/14/2012  . Drug use: No    Comment: former quit cocaine 03/14/2012   . Sexual activity: Yes  Lifestyle  . Physical activity    Days per week: Patient refused    Minutes per session: Patient refused  . Stress: Not on file  Relationships  . Social Herbalist on phone: Patient refused    Gets together: Patient refused    Attends religious service: Patient refused    Active member of club or organization: Patient refused    Attends  meetings of clubs or organizations: Patient refused    Relationship status: Patient refused  Other Topics Concern  . Not on file  Social History Narrative   No kids   MBA   Disabled     Allergies: No Known Allergies  Metabolic Disorder Labs: Lab Results  Component Value Date   HGBA1C 5.2 10/25/2017   No results found for: PROLACTIN Lab Results  Component Value Date   CHOL 188 10/25/2017   TRIG 158 (H) 10/25/2017   HDL 42 10/25/2017   CHOLHDL 4.5 10/25/2017   LDLCALC 114 (H) 10/25/2017   Lab Results  Component Value Date   TSH 0.97 06/23/2018   TSH 2.150 10/25/2017    Therapeutic Level Labs: Lab Results  Component Value Date   LITHIUM 0.4 (L) 06/23/2018   LITHIUM 0.8 10/25/2017   No results  found for: VALPROATE No components found for:  CBMZ  Current Medications: Current Outpatient Medications  Medication Sig Dispense Refill  . clomiPHENE (CLOMID) 50 MG tablet Take by mouth.    . busPIRone (BUSPAR) 30 MG tablet Take 2 tablets (60 mg total) by mouth daily. 60 tablet 0  . clomiPHENE (CLOMID) 50 MG tablet Take 1 tablet (50 mg total) by mouth every other day. (Patient taking differently: Take 50 mg by mouth every other day. (or as directed)) 60 tablet 0  . escitalopram (LEXAPRO) 10 MG tablet Take 1 tablet (10 mg total) by mouth daily. 30 tablet 0  . lamoTRIgine (LAMICTAL) 200 MG tablet Take 1 tablet (200 mg total) by mouth daily. 30 tablet 0  . ondansetron (ZOFRAN-ODT) 4 MG disintegrating tablet Take 1 tablet (4 mg total) by mouth every 6 (six) hours as needed for nausea or vomiting. 60 tablet 0  . pantoprazole (PROTONIX) 40 MG tablet Take 1 tablet (40 mg total) by mouth daily. 30 tablet 0  . sildenafil (REVATIO) 20 MG tablet Take 2 tablets (40 mg total) by mouth daily. Prn 60 tablet 5  . SUMAtriptan (IMITREX) 100 MG tablet May repeat in 2 hours if not helping. Max dose 200 mg in 1 day. Do not take more than 2x per week 9 tablet 5  . zolpidem (AMBIEN) 5 MG tablet Take  1 tablet (5 mg total) by mouth at bedtime as needed for sleep. 30 tablet 1   No current facility-administered medications for this visit.      Musculoskeletal: Strength & Muscle Tone: UTA Gait & Station: normal Patient leans: N/A  Psychiatric Specialty Exam: Review of Systems  Psychiatric/Behavioral: Negative for depression, hallucinations, substance abuse and suicidal ideas. The patient is not nervous/anxious.   All other systems reviewed and are negative.   There were no vitals taken for this visit.There is no height or weight on file to calculate BMI.  General Appearance: Casual  Eye Contact:  Fair  Speech:  Clear and Coherent  Volume:  Normal  Mood:  Euthymic  Affect:  Congruent  Thought Process:  Goal Directed and Descriptions of Associations: Intact  Orientation:  Full (Time, Place, and Person)  Thought Content: Logical   Suicidal Thoughts:  No  Homicidal Thoughts:  No  Memory:  Immediate;   Fair Recent;   Fair Remote;   Fair  Judgement:  Fair  Insight:  Fair  Psychomotor Activity:  Normal  Concentration:  Concentration: Fair and Attention Span: Fair  Recall:  [employer] of Knowledge: Fair  Language: Fair  Akathisia:  No  Handed:  Right  AIMS (if indicated): Denies tremors, rigidity  Assets:  Communication Skills Desire for Improvement Housing Social Support  ADL's:  Intact  Cognition: WNL  Sleep:  Fair   Screenings: AUDIT     Admission (Discharged) from 06/01/2019 in [place]  Alcohol Use Disorder Identification Test Final Score (AUDIT)  33    PHQ2-9     Office Visit from 06/24/2018 in [place] Office Visit from 06/19/2018 in [place] Procedure visit from 05/19/2018 in [place] Office Visit from 02/26/2018 in [place] Procedure visit from 02/05/2018 in [place]  PHQ-2 Total Score  0  0  0  0  0       Assessment and Plan: Alexandra is a 43 year old male, divorced, lives  in [place], has a history of PTSD, panic attacks, bipolar disorder, alcohol and cocaine use disorder, migraine headaches was evaluated by telemedicine today.  Patient is biologically predisposed given his history of substance abuse problems in the past as well as history of trauma.  Patient recently had inpatient mental health admission after an overdose.  Patient however currently is making progress.  Plan as noted below.  Plan For PTSD-improving Lexapro 10 mg p.o. daily Patient was referred for CBT with therapist here in clinic.  Panic attacks-improving Lexapro as prescribed He was on clonazepam in the past however he currently reports he is not taking it.  Bipolar disorder-improving Lamotrigine 200 mg p.o. daily Lexapro 10 mg p.o. daily BuSpar 30 mg p.o. twice daily Discontinue trazodone for lack of benefit Start Ambien 5 mg p.o. nightly  Alcohol use disorder moderate- patient relapsed recently Per notes from the ED dated 06/01/2019-patient was drinking 24 beers per day and also got some opioid that he took. Patient to continue AA meetings at this time  History of cocaine use disorder in remission-we will monitor closely  I have reviewed medical records in [facility] per Dr. Weber Cooks dated 8/29 - 06/01/2019 as summarized above  Follow-up in clinic in 4 weeks or sooner if needed.  October 8 at 2:30 PM  I have spent atleast 25 minutes non face to face with patient today. More than 50 % of the time was spent for psychoeducation and supportive psychotherapy and care coordination. This note was generated in part or whole with voice recognition software. Voice recognition is usually quite accurate but there are transcription errors that can and very often do occur. I apologize for any typographical errors that were not detected and  corrected.         Ursula Alert, MD 06/17/2019, 12:42 PM

## 2019-06-19 ENCOUNTER — Encounter: Payer: Self-pay | Admitting: Internal Medicine

## 2019-06-23 ENCOUNTER — Ambulatory Visit: Payer: Medicare PPO | Admitting: Licensed Clinical Social Worker

## 2019-06-23 NOTE — Telephone Encounter (Signed)
Spoke to pt and scheduled AWV with Denisa.   Pt wants to leave 11/3 appt as a follow up and discuss scheduling CPE with MD

## 2019-06-25 ENCOUNTER — Telehealth: Payer: Self-pay

## 2019-06-25 DIAGNOSIS — G4700 Insomnia, unspecified: Secondary | ICD-10-CM

## 2019-06-25 MED ORDER — ZOLPIDEM TARTRATE 10 MG PO TABS: 10.0000 mg | ORAL_TABLET | Freq: Every evening | ORAL | 0 refills | Status: DC | PRN

## 2019-06-25 NOTE — Telephone Encounter (Signed)
pt called states he wants to try the ambien 10mg  .he said that you told him to call if it was working

## 2019-06-25 NOTE — Telephone Encounter (Signed)
Sent ambien 10 mg to pharmacy.

## 2019-06-26 ENCOUNTER — Ambulatory Visit (INDEPENDENT_AMBULATORY_CARE_PROVIDER_SITE_OTHER): Payer: Medicare PPO

## 2019-06-26 ENCOUNTER — Other Ambulatory Visit: Payer: Self-pay

## 2019-06-26 DIAGNOSIS — Z0000 Encounter for general adult medical examination without abnormal findings: Secondary | ICD-10-CM | POA: Diagnosis not present

## 2019-06-26 DIAGNOSIS — I10 Essential (primary) hypertension: Secondary | ICD-10-CM

## 2019-06-26 NOTE — Patient Instructions (Addendum)
  Mr. Kempner , Thank you for taking time to come for your Medicare Wellness Visit. I appreciate your ongoing commitment to your health goals. Please review the following plan we discussed and let me know if I can assist you in the future.   These are the goals we discussed: Goals    . Follow up with Primary Care Provider     As needed        This is a list of the screening recommended for you and due dates:  Health Maintenance  Topic Date Due  . Flu Shot  05/02/2019  . Tetanus Vaccine  12/01/2025  . HIV Screening  Completed

## 2019-06-26 NOTE — Progress Notes (Addendum)
Subjective:   Johnny Villa is a 43 y.o. male who presents for an Initial Medicare Annual Wellness Visit.  Review of Systems  No ROS.  Medicare Wellness Virtual Visit.  Visual/audio telehealth visit, UTA vital signs.   See social history for additional risk factors.   Cardiac Risk Factors include: male gender;hypertension    Objective:    Today's Vitals   There is no height or weight on file to calculate BMI.  Advanced Directives 06/26/2019 05/30/2019 08/30/2018 06/24/2018 06/06/2018 05/19/2018 02/05/2018  Does Patient Have a Medical Advance Directive? Yes Yes No Yes Yes Yes Yes  Type of Paramedic of [place];Living will [place];Living will - [place];Living will [place];Living will [place];Living will [place]  Does patient want to make changes to medical advance directive? No - Patient declined - - - - - -  Copy of [place] in Chart? No - copy requested - - - No - copy requested No - copy requested -  Would patient like information on creating a medical advance directive? - - - - - - -  Some encounter information is confidential and restricted. Go to Review Flowsheets activity to see all data.    Current Medications (verified) Outpatient Encounter Medications as of 06/26/2019  Medication Sig  . busPIRone (BUSPAR) 30 MG tablet Take 2 tablets (60 mg total) by mouth daily.  . clomiPHENE (CLOMID) 50 MG tablet Take by mouth.  . escitalopram (LEXAPRO) 10 MG tablet Take 1 tablet (10 mg total) by mouth daily.  Marland Kitchen lamoTRIgine (LAMICTAL) 200 MG tablet Take 1 tablet (200 mg total) by mouth daily.  . ondansetron (ZOFRAN-ODT) 4 MG disintegrating tablet Take 1 tablet (4 mg total) by mouth every 6 (six) hours as needed for nausea or vomiting.  . sildenafil (REVATIO) 20 MG tablet Take 2 tablets (40 mg total) by mouth daily. Prn  . SUMAtriptan (IMITREX)  100 MG tablet May repeat in 2 hours if not helping. Max dose 200 mg in 1 day. Do not take more than 2x per week  . zolpidem (AMBIEN) 10 MG tablet Take 1 tablet (10 mg total) by mouth at bedtime as needed for sleep.  . [DISCONTINUED] clomiPHENE (CLOMID) 50 MG tablet Take 1 tablet (50 mg total) by mouth every other day. (Patient taking differently: Take 50 mg by mouth every other day. (or as directed))  . [DISCONTINUED] pantoprazole (PROTONIX) 40 MG tablet Take 1 tablet (40 mg total) by mouth daily.   No facility-administered encounter medications on file as of 06/26/2019.     Allergies (verified) Patient has no known allergies.   History: Past Medical History:  Diagnosis Date  . Bipolar disorder [facility])    psychiatrist in [place]  . Blood in stool   . Depression   . Drug abuse in remission [facility])    sober for 6 years  . Fatty liver 01/2018  . H/O alcohol abuse   . Headache    migraines  . Hypertension   . Low testosterone in male    Past Surgical History:  Procedure Laterality Date  . APPENDECTOMY     2006  . COLONOSCOPY WITH PROPOFOL N/A 12/30/2017   Procedure: COLONOSCOPY WITH PROPOFOL;  Surgeon: Lin Landsman, MD;  Location: [facility] ENDOSCOPY;  Service: Gastroenterology;  Laterality: N/A;  . ESOPHAGOGASTRODUODENOSCOPY (EGD) WITH PROPOFOL N/A 12/30/2017   Procedure: ESOPHAGOGASTRODUODENOSCOPY (EGD) WITH PROPOFOL;  Surgeon: Lin Landsman, MD;  Location: [facility] ENDOSCOPY;  Service: Gastroenterology;  Laterality: N/A;   Family History  Problem Relation Age of Onset  . Depression Mother   . Alcohol abuse Father   . Cancer Father        prostate dx'ed 77  . COPD Father   . Hyperlipidemia Father   . Hypertension Father    Social History   Socioeconomic History  . Marital status: Single    Spouse name: Not on file  . Number of children: Not on file  . Years of education: Not on file  . Highest education level: Not on file  Occupational History  . Occupation: disabled   Social Needs  . Financial resource strain: Not hard at all  . Food insecurity    Worry: Patient refused    Inability: Patient refused  . Transportation needs    Medical: Patient refused    Non-medical: Patient refused  Tobacco Use  . Smoking status: Former Smoker    Types: Cigarettes    Quit date: 2020    Years since quitting: 0.7  . Smokeless tobacco: Never Used  Substance and Sexual Activity  . Alcohol use: No    Frequency: Never    Comment: former quit 03/14/2012  . Drug use: No    Comment: former quit cocaine 03/14/2012   . Sexual activity: Yes  Lifestyle  . Physical activity    Days per week: Patient refused    Minutes per session: Patient refused  . Stress: Not on file  Relationships  . Social Herbalist on phone: Patient refused    Gets together: Patient refused    Attends religious service: Patient refused    Active member of club or organization: Patient refused    Attends meetings of clubs or organizations: Patient refused    Relationship status: Patient refused  Other Topics Concern  . Not on file  Social History Narrative   No kids   MBA   Disabled    Tobacco Counseling Counseling given: Not Answered   Clinical Intake:  Pre-visit preparation completed: Yes        Diabetes: No  How often do you need to have someone help you when you read instructions, pamphlets, or other written materials from your doctor or pharmacy?: 1 - Never  Interpreter Needed?: No     Activities of Daily Living In your present state of health, do you have any difficulty performing the following activities: 06/26/2019 05/31/2019  Hearing? N N  Vision? N N  Difficulty concentrating or making decisions? N N  Walking or climbing stairs? N N  Dressing or bathing? N N  Doing errands, shopping? N -  Preparing Food and eating ? N -  Using the Toilet? N -  In the past six months, have you accidently leaked urine? N -  Do you have problems with loss of bowel  control? N -  Managing your Medications? N -  Managing your Finances? N -  Housekeeping or managing your Housekeeping? N -  Some encounter information is confidential and restricted. Go to Review Flowsheets activity to see all data.  Some recent data might be hidden     Immunizations and Health Maintenance Immunization History  Administered Date(s) Administered  . Hep A / Hep B 07/19/2016  . Hepatitis A, Adult 12/16/2015  . Hepatitis B, adult 01/16/2016, 02/22/2016  . Influenza,inj,Quad PF,6+ Mos 08/10/2017, 06/19/2018  . Influenza-Unspecified 08/10/2017  . MMR 12/16/2015, 01/16/2016  . PPD Test 12/02/2015  .  Tdap 12/02/2015   Health Maintenance Due  Topic Date Due  . INFLUENZA VACCINE  05/02/2019    Patient Care Team: McLean-Scocuzza, Nino Glow, MD as PCP - General (Internal Medicine)  Indicate any recent Medical Services you may have received from other than [facility] providers in the past year (date may be approximate).    Assessment:   This is a routine wellness examination for [facility].  I connected with patient 06/26/19 at  8:30 AM EDT by an audio enabled telemedicine application and verified that I am speaking with the correct person using two identifiers. Patient stated full name and DOB. Patient gave permission to continue with virtual visit. Patient's location was at home and Nurse's location was at [place] office.   Health Maintenance Due: Influenza vaccine 2020- discussed; to be completed in season with doctor or local pharmacy.   Update all pending maintenance due as appropriate.   See completed HM at the end of note.   Eye: Visual acuity not assessed. Virtual visit. Wears corrective lenses.   Dental: Visits every 6 months.    Hearing: Demonstrates normal hearing during visit.  Safety:  Patient feels safe at home- yes Patient does have smoke detectors at home- yes Patient does wear sunscreen or protective clothing when in direct sunlight - yes Patient does  wear seat belt when in a moving vehicle - yes Patient drives- yes Adequate lighting in walkways free from debris- yes Grab bars and handrails used as appropriate- yes Ambulates with no assistive device  Social: Alcohol intake - no    Smoking history- former   Smokers in home? none Illicit drug use? none  Depression: PHQ 2 &9 complete. See screening below. Denies irritability, anhedonia, sadness/tearfullness.  Stable. Taking medication as directed.   Falls: See screening below.    Medication: Taking as directed and without issues.   Covid-19: Precautions and sickness symptoms discussed. Wears mask, social distancing, hand hygiene as appropriate.   Activities of Daily Living Patient denies needing assistance with: household chores, feeding themselves, getting from bed to chair, getting to the toilet, bathing/showering, dressing, managing money, or preparing meals.   Memory: Patient is alert. Patient denies difficulty focusing or concentrating. Correctly identified the president of the [place], season and recall. Patient likes to read, play computer games, complete puzzles for brain stimulation.  BMI- discussed the importance of a healthy diet, water intake and the benefits of aerobic exercise.  Educational material provided.  Physical activity- endurance and weight training daily 1-2 hours.  Diet:  Healthy  Water: good intake Caffeine: 1-2 cups  Advanced Directive: End of life planning; Advance aging; Advanced directives discussed.  Copy of current HCPOA/Living Will requested.    Other Providers Patient Care Team: McLean-Scocuzza, Nino Glow, MD as PCP - General (Internal Medicine)  Hearing/Vision screen  Hearing Screening   125Hz  250Hz  500Hz  1000Hz  2000Hz  3000Hz  4000Hz  6000Hz  8000Hz   Right ear:           Left ear:           Comments: Patient is able to hear conversational tones without difficulty.  No issues reported.   Vision Screening Comments: Wears corrective  lenses Visual acuity not assessed, virtual visit.  They have seen their ophthalmologist in the last 12 months.     Dietary issues and exercise activities discussed: Current Exercise Habits: Home exercise routine, Type of exercise: strength training/weights, Intensity: Mild  Goals    . Follow up with Primary Care Provider     As needed  Depression Screen PHQ 2/9 Scores 06/26/2019 06/24/2018 06/19/2018 05/19/2018  PHQ - 2 Score 0 0 0 0  Exception Documentation - - - -    Fall Risk Fall Risk  06/26/2019 08/01/2018 06/24/2018 06/19/2018 05/19/2018  Falls in the past year? 0 0 No No No   Timed Get Up and Go performed: no, virtual visit.  Cognitive Function:     6CIT Screen 06/26/2019  What Year? 0 points  What month? 0 points  What time? 0 points  Count back from 20 0 points  Months in reverse 0 points  Repeat phrase 0 points  Total Score 0    Screening Tests Health Maintenance  Topic Date Due  . INFLUENZA VACCINE  05/02/2019  . TETANUS/TDAP  12/01/2025  . HIV Screening  Completed      Plan:   Keep all routine maintenance appointments.   Follow up 08/04/19 @ 9:00  Community resource placed for blood pressure monitor and pulse oximeter per patient request for monitoring at home.  Denies any acute symptoms associated with blood pressure. Followed by pcp.   Medicare Attestation I have personally reviewed: The patient's medical and social history Their use of alcohol, tobacco or illicit drugs Their current medications and supplements The patient's functional ability including ADLs,fall risks, home safety risks, cognitive, and hearing and visual impairment Diet and physical activities Evidence for depression   In addition, I have reviewed and discussed with patient certain preventive protocols, quality metrics, and best practice recommendations. A written personalized care plan for preventive services as well as general preventive health recommendations were provided  to patient via mail.     Varney Biles, LPN   9/33/8826    Agree with above  TMS

## 2019-06-30 ENCOUNTER — Telehealth: Payer: Self-pay

## 2019-06-30 NOTE — Telephone Encounter (Signed)
Copied from [place] [id number]. Topic: Referral - Status >> Jun 30, 2019 [id number] AM Simone Curia D wrote: [id number] Spoke with patient about blood pressure monitors and pulse oximerters.  Emailed information to patient with his permission. Ambrose Mantle [phone]

## 2019-07-09 ENCOUNTER — Ambulatory Visit (INDEPENDENT_AMBULATORY_CARE_PROVIDER_SITE_OTHER): Payer: Medicare PPO | Admitting: Psychiatry

## 2019-07-09 ENCOUNTER — Ambulatory Visit (INDEPENDENT_AMBULATORY_CARE_PROVIDER_SITE_OTHER): Payer: Medicare PPO

## 2019-07-09 ENCOUNTER — Other Ambulatory Visit: Payer: Self-pay

## 2019-07-09 ENCOUNTER — Encounter: Payer: Self-pay | Admitting: Psychiatry

## 2019-07-09 DIAGNOSIS — Z23 Encounter for immunization: Secondary | ICD-10-CM

## 2019-07-09 DIAGNOSIS — G4700 Insomnia, unspecified: Secondary | ICD-10-CM

## 2019-07-09 DIAGNOSIS — F1421 Cocaine dependence, in remission: Secondary | ICD-10-CM | POA: Diagnosis not present

## 2019-07-09 DIAGNOSIS — F3130 Bipolar disorder, current episode depressed, mild or moderate severity, unspecified: Secondary | ICD-10-CM | POA: Diagnosis not present

## 2019-07-09 DIAGNOSIS — F1021 Alcohol dependence, in remission: Secondary | ICD-10-CM

## 2019-07-09 DIAGNOSIS — F5105 Insomnia due to other mental disorder: Secondary | ICD-10-CM | POA: Insufficient documentation

## 2019-07-09 DIAGNOSIS — F4310 Post-traumatic stress disorder, unspecified: Secondary | ICD-10-CM

## 2019-07-09 DIAGNOSIS — F1020 Alcohol dependence, uncomplicated: Secondary | ICD-10-CM | POA: Insufficient documentation

## 2019-07-09 MED ORDER — ESCITALOPRAM OXALATE 10 MG PO TABS: 10.0000 mg | ORAL_TABLET | Freq: Every day | ORAL | 1 refills | Status: DC

## 2019-07-09 MED ORDER — LAMOTRIGINE 200 MG PO TABS: 200.0000 mg | ORAL_TABLET | Freq: Every day | ORAL | 1 refills | Status: DC

## 2019-07-09 MED ORDER — BUSPIRONE HCL 30 MG PO TABS: 30.0000 mg | ORAL_TABLET | Freq: Two times a day (BID) | ORAL | 1 refills | Status: DC

## 2019-07-09 MED ORDER — ZOLPIDEM TARTRATE 10 MG PO TABS: 10.0000 mg | ORAL_TABLET | Freq: Every evening | ORAL | 0 refills | Status: DC | PRN

## 2019-07-09 NOTE — Progress Notes (Signed)
Virtual Visit via Video Note  I connected with Johnny Villa on 07/09/19 at  2:30 PM EDT by a video enabled telemedicine application and verified that I am speaking with the correct person using two identifiers.   I discussed the limitations of evaluation and management by telemedicine and the availability of in person appointments. The patient expressed understanding and agreed to proceed.   I discussed the assessment and treatment plan with the patient. The patient was provided an opportunity to ask questions and all were answered. The patient agreed with the plan and demonstrated an understanding of the instructions.   The patient was advised to call back or seek an in-person evaluation if the symptoms worsen or if the condition fails to improve as anticipated.  [facility] MD OP Progress Note  07/09/2019 5:00 PM Johnny Villa  MRN:  [id number]  Chief Complaint:  Chief Complaint    Follow-up     HPI: Johnny Villa is a 43 year old male, currently lives in [place], disability, has a history of bipolar disorder, PTSD, panic attacks, alcohol use disorder in remission, migraine headaches, cocaine use disorder in remission was evaluated by telemedicine today.  Patient today reports he continues to feel depressed on and off however he has been making progress.  He reports today he feels much better and is in a better mood.  However the last few days he did have some on and off sadness however he was able to cope with it and was able to distract himself.  He reports he tries to stay busy and that helps.  He continues to go for [employer].  He reports he has virtual meetings in the morning and has face-to-face meeting in the afternoon.  He does have a sponsor now.  He reports going to these meetings has been very beneficial.  He has been staying sober and his last drink was prior to his inpatient hospitalization in August 2020.  Patient reports prior to that he was sober for 7 years and relapsed the day  that he went into the hospital.    He reports sleep is good on the current medications.  He continues to be compliant with his medications as prescribed.  He denies any side effects.  Patient denies any suicidality, homicidality or perceptual disturbances.  Patient denies any other concerns today.   Visit Diagnosis:    ICD-10-CM   1. PTSD (post-traumatic stress disorder)  F43.10 escitalopram (LEXAPRO) 10 MG tablet    lamoTRIgine (LAMICTAL) 200 MG tablet  2. Bipolar I disorder, most recent episode depressed ([name])  F31.30 escitalopram (LEXAPRO) 10 MG tablet    lamoTRIgine (LAMICTAL) 200 MG tablet  3. Insomnia, unspecified type  G47.00 lamoTRIgine (LAMICTAL) 200 MG tablet    zolpidem (AMBIEN) 10 MG tablet  4. Alcohol use disorder, moderate, in early remission ([place])  F10.21   5. Cocaine use disorder, moderate, in sustained remission ([place])  F14.21     Past Psychiatric History: I have reviewed past psychiatric history from my progress note on 05/04/2019.  Past trials of Klonopin, Lexapro, Rexulti, Wellbutrin, lamotrigine, lithium, BuSpar, Vraylar  Past Medical History:  Past Medical History:  Diagnosis Date  . Bipolar disorder [facility])    psychiatrist in [place]  . Blood in stool   . Depression   . Drug abuse in remission [facility])    sober for 6 years  . Fatty liver 01/2018  . H/O alcohol abuse   . Headache    migraines  . Hypertension   . Low  testosterone in male     Past Surgical History:  Procedure Laterality Date  . APPENDECTOMY     2006  . COLONOSCOPY WITH PROPOFOL N/A 12/30/2017   Procedure: COLONOSCOPY WITH PROPOFOL;  Surgeon: Lin Landsman, MD;  Location: [facility] ENDOSCOPY;  Service: Gastroenterology;  Laterality: N/A;  . ESOPHAGOGASTRODUODENOSCOPY (EGD) WITH PROPOFOL N/A 12/30/2017   Procedure: ESOPHAGOGASTRODUODENOSCOPY (EGD) WITH PROPOFOL;  Surgeon: Lin Landsman, MD;  Location: [facility] ENDOSCOPY;  Service: Gastroenterology;  Laterality: N/A;    Family Psychiatric  History: I have reviewed family psychiatric history from my progress note on 05/04/2019.  Family History:  Family History  Problem Relation Age of Onset  . Depression Mother   . Alcohol abuse Father   . Cancer Father        prostate dx'ed 21  . COPD Father   . Hyperlipidemia Father   . Hypertension Father     Social History: I have reviewed social history from my progress note on 05/04/2019. Social History   Socioeconomic History  . Marital status: Single    Spouse name: Not on file  . Number of children: Not on file  . Years of education: Not on file  . Highest education level: Not on file  Occupational History  . Occupation: disabled  Social Needs  . Financial resource strain: Not hard at all  . Food insecurity    Worry: Patient refused    Inability: Patient refused  . Transportation needs    Medical: Patient refused    Non-medical: Patient refused  Tobacco Use  . Smoking status: Former Smoker    Types: Cigarettes    Quit date: 2020    Years since quitting: 0.7  . Smokeless tobacco: Never Used  Substance and Sexual Activity  . Alcohol use: No    Frequency: Never    Comment: former quit 03/14/2012  . Drug use: No    Comment: former quit cocaine 03/14/2012   . Sexual activity: Yes  Lifestyle  . Physical activity    Days per week: Patient refused    Minutes per session: Patient refused  . Stress: Not on file  Relationships  . Social Herbalist on phone: Patient refused    Gets together: Patient refused    Attends religious service: Patient refused    Active member of club or organization: Patient refused    Attends meetings of clubs or organizations: Patient refused    Relationship status: Patient refused  Other Topics Concern  . Not on file  Social History Narrative   No kids   MBA   Disabled     Allergies: No Known Allergies  Metabolic Disorder Labs: Lab Results  Component Value Date   HGBA1C 5.2 10/25/2017   No results found for:  PROLACTIN Lab Results  Component Value Date   CHOL 188 10/25/2017   TRIG 158 (H) 10/25/2017   HDL 42 10/25/2017   CHOLHDL 4.5 10/25/2017   LDLCALC 114 (H) 10/25/2017   Lab Results  Component Value Date   TSH 0.97 06/23/2018   TSH 2.150 10/25/2017    Therapeutic Level Labs: Lab Results  Component Value Date   LITHIUM 0.4 (L) 06/23/2018   LITHIUM 0.8 10/25/2017   No results found for: VALPROATE No components found for:  CBMZ  Current Medications: Current Outpatient Medications  Medication Sig Dispense Refill  . busPIRone (BUSPAR) 30 MG tablet Take 1 tablet (30 mg total) by mouth 2 (two) times daily. Pt has supplies 60 tablet 1  .  clomiPHENE (CLOMID) 50 MG tablet Take by mouth.    . escitalopram (LEXAPRO) 10 MG tablet Take 1 tablet (10 mg total) by mouth daily. Pt has supplies 30 tablet 1  . lamoTRIgine (LAMICTAL) 200 MG tablet Take 1 tablet (200 mg total) by mouth daily. 30 tablet 1  . ondansetron (ZOFRAN-ODT) 4 MG disintegrating tablet Take 1 tablet (4 mg total) by mouth every 6 (six) hours as needed for nausea or vomiting. 60 tablet 0  . sildenafil (REVATIO) 20 MG tablet Take 2 tablets (40 mg total) by mouth daily. Prn 60 tablet 5  . SUMAtriptan (IMITREX) 100 MG tablet May repeat in 2 hours if not helping. Max dose 200 mg in 1 day. Do not take more than 2x per week 9 tablet 5  . zolpidem (AMBIEN) 10 MG tablet Take 1 tablet (10 mg total) by mouth at bedtime as needed for sleep. 90 tablet 0   No current facility-administered medications for this visit.      Musculoskeletal: Strength & Muscle Tone: UTA Gait & Station: normal Patient leans: N/A  Psychiatric Specialty Exam: Review of Systems  Psychiatric/Behavioral: Positive for depression (improving).  All other systems reviewed and are negative.   There were no vitals taken for this visit.There is no height or weight on file to calculate BMI.  General Appearance: Casual  Eye Contact:  Fair  Speech:  Clear and  Coherent  Volume:  Normal  Mood:  Depressed improving  Affect:  Congruent  Thought Process:  Goal Directed and Descriptions of Associations: Intact  Orientation:  Full (Time, Place, and Person)  Thought Content: Logical   Suicidal Thoughts:  No  Homicidal Thoughts:  No  Memory:  Immediate;   Fair Recent;   Fair Remote;   Fair  Judgement:  Fair  Insight:  Fair  Psychomotor Activity:  Normal  Concentration:  Concentration: Fair and Attention Span: Fair  Recall:  [employer] of Knowledge: Fair  Language: Fair  Akathisia:  No  Handed:  Right  AIMS (if indicated): Denies tremors, rigidity  Assets:  Communication Skills Desire for Improvement Housing Social Support  ADL's:  Intact  Cognition: WNL  Sleep:  Fair   Screenings: AUDIT     Admission (Discharged) from 06/01/2019 in [place]  Alcohol Use Disorder Identification Test Final Score (AUDIT)  33    PHQ2-9     Office Visit from 07/09/2019 in [place] from 06/26/2019 in [facility] Office Visit from 06/24/2018 in [place] Office Visit from 06/19/2018 in [place] Procedure visit from 05/19/2018 in [place]  PHQ-2 Total Score  2  0  0  0  0  PHQ-9 Total Score  4  -  -  -  -       Assessment and Plan: Rhett is a 43 year old male, divorced, lives in [place], has a history of PTSD, panic attacks, bipolar disorder, alcohol and cocaine use disorder, migraine headaches was evaluated by telemedicine today.  Patient is biologically predisposed given his history of substance abuse problems in the past as well as history of trauma.  Patient is currently making progress on the current medication regimen.  He continues to go to [employer] and does have a sponsor.  Patient continues to stay sober.  Patient will benefit from the following  plan.  Plan PTSD-improving Lexapro 10 mg p.o. daily Patient was referred for  psychotherapy sessions in the past- however he declined.  Panic attacks-stable Lexapro as prescribed  Bipolar disorder-stable Lamotrigine 200 mg p.o. daily Lexapro 10 mg p.o. daily BuSpar 30 mg p.o. twice daily Ambien 5 mg p.o. nightly for sleep  Alcohol use disorder moderate- in early remission Patient continues to be in [employer] and has a sponsor.  He has been sober since May 29, 2019.  History of cocaine use disorder in remission- we will monitor closely.  Follow-up in clinic in 4 to 6 weeks or sooner if needed.  November 19 at 11 AM  I have spent atleast 15 minutes non face to face with patient today. More than 50 % of the time was spent for psychoeducation and supportive psychotherapy and care coordination. This note was generated in part or whole with voice recognition software. Voice recognition is usually quite accurate but there are transcription errors that can and very often do occur. I apologize for any typographical errors that were not detected and corrected.       Ursula Alert, MD 07/09/2019, 5:00 PM

## 2019-07-10 ENCOUNTER — Telehealth: Payer: Self-pay

## 2019-07-10 NOTE — Telephone Encounter (Signed)
Copied from [place] [id number]. Topic: Referral - Status >> Jul 10, 2019  [id number] PM Simone Curia D wrote: [id number] Spoke with patient he was able to obtain a blood pressure monitor and has no other needs right now.  Patient has my contact information. Ambrose Mantle [phone]

## 2019-07-16 ENCOUNTER — Encounter (INDEPENDENT_AMBULATORY_CARE_PROVIDER_SITE_OTHER): Payer: Medicare PPO | Admitting: Internal Medicine

## 2019-07-16 DIAGNOSIS — J45990 Exercise induced bronchospasm: Secondary | ICD-10-CM | POA: Diagnosis not present

## 2019-07-16 MED ORDER — ALBUTEROL SULFATE HFA 108 (90 BASE) MCG/ACT IN AERS: 2.0000 | INHALATION_SPRAY | RESPIRATORY_TRACT | 11 refills | Status: DC | PRN

## 2019-07-16 NOTE — Telephone Encounter (Signed)
Yes I'm fine with a bill to my insurance for this visit. [facility] is [facility] [address]. Thank you   Previous Messages      Non-Urgent Medical Question  Demoni, Haenel  You 2 hours ago (10:40 AM)     Yes I'm fine with a bill to my insurance for this visit. My [facility] is [facility] [address]. Thank you         You  Cocking, Naithen 2 hours ago (10:37 AM)     If you like to address this via a my chart message and get an prescription for exercise induced asthma but there will be a charge for your insurance   Typically albuterol inhaler 30 minutes before exercise, are you ok with a bill to your insurance for addressing this via my chart?   [place]        Elpidio Galea T, CMA routed conversation to You 2 hours ago (10:08 AM)    Theodosia Blender, Lao  You 3 hours ago (9:56 AM)     With the change in weather I'm having trouble breathing while exercising outdoors. Can you make a recommendation for the treatment of my exercise induced bronchospasm?   A/p  Exercise induced asthma  Prn Albuterol 30 min before exercise   Agreeable to fee  TMS 5-10 minutes

## 2019-07-20 ENCOUNTER — Telehealth: Payer: Self-pay

## 2019-07-20 NOTE — Telephone Encounter (Signed)
pt called wanted to know if you would do a ETOH evaluation on him it is going to be threw the court and he needs to get it done. If not do you know who he can see. ?

## 2019-07-20 NOTE — Telephone Encounter (Signed)
I would not do it. Not sure .May be he should contact health insurance plan.

## 2019-07-21 ENCOUNTER — Ambulatory Visit: Payer: Medicare PPO | Admitting: Student in an Organized Health Care Education/Training Program

## 2019-07-30 DIAGNOSIS — J3489 Other specified disorders of nose and nasal sinuses: Secondary | ICD-10-CM | POA: Diagnosis not present

## 2019-08-04 ENCOUNTER — Encounter: Payer: Self-pay | Admitting: Internal Medicine

## 2019-08-04 ENCOUNTER — Ambulatory Visit (INDEPENDENT_AMBULATORY_CARE_PROVIDER_SITE_OTHER): Payer: Medicare PPO | Admitting: Internal Medicine

## 2019-08-04 ENCOUNTER — Other Ambulatory Visit: Payer: Self-pay

## 2019-08-04 VITALS — BP 128/82 | Ht 71.0 in | Wt 205.0 lb

## 2019-08-04 DIAGNOSIS — E876 Hypokalemia: Secondary | ICD-10-CM

## 2019-08-04 DIAGNOSIS — E291 Testicular hypofunction: Secondary | ICD-10-CM

## 2019-08-04 DIAGNOSIS — Z1329 Encounter for screening for other suspected endocrine disorder: Secondary | ICD-10-CM | POA: Diagnosis not present

## 2019-08-04 DIAGNOSIS — E559 Vitamin D deficiency, unspecified: Secondary | ICD-10-CM | POA: Diagnosis not present

## 2019-08-04 DIAGNOSIS — J45990 Exercise induced bronchospasm: Secondary | ICD-10-CM

## 2019-08-04 DIAGNOSIS — G43809 Other migraine, not intractable, without status migrainosus: Secondary | ICD-10-CM

## 2019-08-04 DIAGNOSIS — N529 Male erectile dysfunction, unspecified: Secondary | ICD-10-CM | POA: Diagnosis not present

## 2019-08-04 DIAGNOSIS — Z13220 Encounter for screening for lipoid disorders: Secondary | ICD-10-CM

## 2019-08-04 DIAGNOSIS — R739 Hyperglycemia, unspecified: Secondary | ICD-10-CM | POA: Diagnosis not present

## 2019-08-04 MED ORDER — SILDENAFIL CITRATE 20 MG PO TABS: 40.0000 mg | ORAL_TABLET | Freq: Every day | ORAL | 11 refills | Status: DC

## 2019-08-04 MED ORDER — SUMATRIPTAN SUCCINATE 100 MG PO TABS: ORAL_TABLET | ORAL | 11 refills | Status: DC

## 2019-08-04 NOTE — Progress Notes (Signed)
Virtual Visit via Video Note  I connected with Johnny Villa  on 08/04/19 at  9:10 AM EST by a video enabled telemedicine application and verified that I am speaking with the correct person using two identifiers.  Location patient: home Location provider:work or home office Persons participating in the virtual visit: patient, provider  I discussed the limitations of evaluation and management by telemedicine and the availability of in person appointments. The patient expressed understanding and agreed to proceed.   HPI: 1. ED wants refill of revatio 40 mg qd  2. Bipolar d/o mood ok likes new psychiatrist and doing well on meds takes ambien 10 mg qhs prn for sleep  He is not doing therapy but doing AA which helps   3. Exercise induced asthma beforehand use of albuterol helps no wheezing, sob, cough with exercise now  4. Hypogonadism on clomid saw endocrine 06/2019 and will f/u in 1 year    ROS: See pertinent positives and negatives per HPI.  Past Medical History:  Diagnosis Date  . Bipolar disorder [facility])    psychiatrist in [place]  . Blood in stool   . Depression   . Drug abuse in remission [facility])    sober for 6 years  . Fatty liver 01/2018  . H/O alcohol abuse   . Headache    migraines  . Hypertension   . Low testosterone in male     Past Surgical History:  Procedure Laterality Date  . APPENDECTOMY     2006  . COLONOSCOPY WITH PROPOFOL N/A 12/30/2017   Procedure: COLONOSCOPY WITH PROPOFOL;  Surgeon: Lin Landsman, MD;  Location: [facility] ENDOSCOPY;  Service: Gastroenterology;  Laterality: N/A;  . ESOPHAGOGASTRODUODENOSCOPY (EGD) WITH PROPOFOL N/A 12/30/2017   Procedure: ESOPHAGOGASTRODUODENOSCOPY (EGD) WITH PROPOFOL;  Surgeon: Lin Landsman, MD;  Location: [facility] ENDOSCOPY;  Service: Gastroenterology;  Laterality: N/A;    Family History  Problem Relation Age of Onset  . Depression Mother   . Alcohol abuse Father   . Cancer Father        prostate dx'ed 12  . COPD  Father   . Hyperlipidemia Father   . 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  albuterol (VENTOLIN HFA) 108 (90 Base) MCG/ACT inhaler, Inhale 2 puffs into the lungs every 4 (four) hours as needed for wheezing or shortness of breath. Or 30 minutes before exercise, Disp: 18 g, Rfl: 11 .  busPIRone (BUSPAR) 30 MG tablet, Take 1 tablet (30 mg total) by mouth 2 (two) times daily. Pt has supplies, Disp: 60 tablet, Rfl: 1 .  clomiPHENE (CLOMID) 50 MG tablet, Take by mouth., Disp: , Rfl:  .  escitalopram (LEXAPRO) 10 MG tablet, Take 1 tablet (10 mg total) by mouth daily. Pt has supplies, Disp: 30 tablet, Rfl: 1 .  lamoTRIgine (LAMICTAL) 200 MG tablet, Take 1 tablet (200 mg total) by mouth daily., Disp: 30 tablet, Rfl: 1 .  ondansetron (ZOFRAN-ODT) 4 MG disintegrating tablet, Take 1 tablet (4 mg total) by mouth every 6 (six) hours as needed for nausea or vomiting., Disp: 60 tablet, Rfl: 0 .  sildenafil (REVATIO) 20 MG tablet, Take 2 tablets (40 mg total) by mouth daily. Prn, Disp: 60 tablet, Rfl: 11 .  SUMAtriptan (IMITREX) 100 MG tablet, May repeat in 2 hours if not helping. Max dose 200 mg  in 1 day. Do not take more than 2x per week, Disp: 9 tablet, Rfl: 11 .  zolpidem (AMBIEN) 10 MG tablet, Take 1 tablet (10 mg total) by mouth at bedtime as needed for sleep., Disp: 90 tablet, Rfl: 0  EXAM:  VITALS per patient if applicable:  GENERAL: alert, oriented, appears well and in no acute distress  HEENT: atraumatic, conjunttiva clear, no obvious abnormalities on inspection of external nose and ears  NECK: normal movements of the head and neck  LUNGS: on inspection no signs of respiratory distress, breathing rate appears normal, no obvious gross SOB, gasping or wheezing  CV: no obvious cyanosis  MS: moves all visible extremities without noticeable abnormality  PSYCH/NEURO:  pleasant and cooperative, no obvious depression or anxiety, speech and thought processing grossly intact  ASSESSMENT AND PLAN:  Discussed the following assessment and plan:  Exercise-induced asthma prn albuterol   Erectile dysfunction, unspecified erectile dysfunction type - Plan: sildenafil (REVATIO) 20 MG tablet  Other migraine without status migrainosus, not intractable - Plan: SUMAtriptan (IMITREX) 100 MG tablet  Hypogonadism  -cont clomid and f/u endocrine 06/2020   Bipolar stable f/u with Dr. Shea Evans as sch  Not currently in therapy   HM Flu shotutd Tdap utd  Declines MMR lab check  HIV neg 05/24/17 See labs 06/2018 repeat labs after 06/2019 in future cmet, cbc, lipid, tsh, ua, vitamin D, lamictal   C/w rhabdo 03/16/2019 [facility] labs  -we discussed possible serious and likely etiologies, options for evaluation and workup, limitations of telemedicine visit vs in person visit, treatment, treatment risks and precautions. Pt prefers to treat via telemedicine empirically rather then risking or undertaking an in person visit at this moment. Patient agrees to seek prompt in person care if worsening, new symptoms arise, or if is not improving with treatment.   I discussed the assessment and treatment plan with the patient. The patient was provided an opportunity to ask questions and all were answered. The patient agreed with the plan and demonstrated an understanding of the instructions.   The patient was advised to call back or seek an in-person evaluation if the symptoms worsen or if the condition fails to improve as anticipated.  Time spent 15 minutes  Delorise Jackson, MD

## 2019-08-04 NOTE — Patient Instructions (Signed)
Potassium Content of Foods  Potassium is a mineral found in many foods and drinks. It affects how the heart works, and helps keep fluids and minerals balanced in the body. The amount of potassium you need each day depends on your age and any medical conditions you may have. Talk to your health care provider or dietitian about how much potassium you need. The following lists of foods provide the general serving size for foods and the approximate amount of potassium in each serving, listed in milligrams (mg). Actual values may vary depending on the product and how it is processed. High in potassium The following foods and beverages have 200 mg or more of potassium per serving:  Apricots (raw) - 2 have 200 mg of potassium.  Apricots (dry) - 5 have 200 mg of potassium.  Artichoke - 1 medium has 345 mg of potassium.  Avocado -  fruit has 245 mg of potassium.  Banana - 1 medium fruit has 425 mg of potassium.  Lima or baked beans (canned) -  cup has 280 mg of potassium.  White beans (canned) -  cup has 595 mg potassium.  Beef roast - 3 oz has 320 mg of potassium.  Ground beef - 3 oz has 270 mg of potassium.  Beets (raw or cooked) -  cup has 260 mg of potassium.  Bran muffin - 2 oz has 300 mg of potassium.  Broccoli (cooked) -  cup has 230 mg of potassium.  Brussels sprouts -  cup has 250 mg of potassium.  Cantaloupe -  cup has 215 mg of potassium.  Cereal, 100% bran -  cup has 200-400 mg of potassium.  Cheeseburger -1 single fast food burger has 225-400 mg of potassium.  Chicken - 3 oz has 220 mg of potassium.  Clams (canned) - 3 oz has 535 mg of potassium.  Crab - 3 oz has 225 mg of potassium.  Dates - 5 have 270 mg of potassium.  Dried beans and peas -  cup has 300-475 mg of potassium.  Figs (dried) - 2 have 260 mg of potassium.  Fish (halibut, tuna, cod, snapper) - 3 oz has 480 mg of potassium.  Fish (salmon, haddock, swordfish, perch) - 3 oz has 300 mg of  potassium.  Fish (tuna, canned) - 3 oz has 200 mg of potassium.  French fries (fast food) - 3 oz has 470 mg of potassium.  Granola with fruit and nuts -  cup has 200 mg of potassium.  Grapefruit juice -  cup has 200 mg of potassium.  Honeydew melon -  cup has 200 mg of potassium.  Kale (raw) - 1 cup has 300 mg of potassium.  Kiwi - 1 medium fruit has 240 mg of potassium.  Kohlrabi, rutabaga, parsnips -  cup has 280 mg of potassium.  Lentils -  cup has 365 mg of potassium.  Mango - 1 each has 325 mg of potassium.  Milk (nonfat, low-fat, whole, buttermilk) - 1 cup has 350-380 mg of potassium.  Milk (chocolate) - 1 cup has 420 mg of potassium  Molasses - 1 Tbsp has 295 mg of potassium.  Mushrooms -  cup has 280 mg of potassium.  Nectarine - 1 each has 275 mg of potassium.  Nuts (almonds, peanuts, hazelnuts, [place], cashew, mixed) - 1 oz has 200 mg of potassium.  Nuts (pistachios) - 1 oz has 295 mg of potassium.  Orange - 1 fruit has 240 mg of potassium.  Orange juice -    cup has 235 mg of potassium.  Papaya -  medium fruit has 390 mg of potassium.  Peanut butter (chunky) - 2 Tbsp has 240 mg of potassium.  Peanut butter (smooth) - 2 Tbsp has 210 mg of potassium.  Pear - 1 medium (200 mg) of potassium.  Pomegranate - 1 whole fruit has 400 mg of potassium.  Pomegranate juice -  cup has 215 mg of potassium.  Pork - 3 oz has 350 mg of potassium.  Potato chips (salted) - 1 oz has 465 mg of potassium.  Potato (baked with skin) - 1 medium has 925 mg of potassium.  Potato (boiled) -  cup has 255 mg of potassium.  Potato (Mashed) -  cup has 330 mg of potassium.  Prune juice -  cup has 370 mg of potassium.  Prunes - 5 have 305 mg of potassium.  Pudding (chocolate) -  cup has 230 mg of potassium.  Pumpkin (canned) -  cup has 250 mg of potassium.  Raisins (seedless) -  cup has 270 mg of potassium.  Seeds (sunflower or pumpkin) - 1 oz has 240 mg of  potassium.  Soy milk - 1 cup has 300 mg of potassium.  Spinach (cooked) - 1/2 cup has 420 mg of potassium.  Spinach (canned) -  cup has 370 mg of potassium.  Sweet potato (baked with skin) - 1 medium has 450 mg of potassium.  Swiss chard -  cup has 480 mg of potassium.  Tomato or vegetable juice -  cup has 275 mg of potassium.  Tomato (sauce or puree) -  cup has 400-550 mg of potassium.  Tomato (raw) - 1 medium has 290 mg of potassium.  Tomato (canned) -  cup has 200-300 mg of potassium.  Turkey - 3 oz has 250 mg of potassium.  Wheat germ - 1 oz has 250 mg of potassium.  Winter squash -  cup has 250 mg of potassium.  Yogurt (plain or fruited) - 6 oz has 260-435 mg of potassium.  Zucchini -  cup has 220 mg of potassium. Moderate in potassium The following foods and beverages have 50-200 mg of potassium per serving:  Apple - 1 fruit has 150 mg of potassium  Apple juice -  cup has 150 mg of potassium  Applesauce -  cup has 90 mg of potassium  Apricot nectar -  cup has 140 mg of potassium  Asparagus (small spears) -  cup has 155 mg of potassium  Asparagus (large spears) - 6 have 155 mg of potassium  Bagel (cinnamon raisin) - 1 four-inch bagel has 130 mg of potassium  Bagel (egg or plain) - 1 four- inch bagel has 70 mg of potassium  Beans (green) -  cup has 90 mg of potassium  Beans (yellow) -  cup has 190 mg of potassium  Beer, regular - 12 oz has 100 mg of potassium  Beets (canned) -  cup has 125 mg of potassium  Blackberries -  cup has 115 mg of potassium  Blueberries -  cup has 60 mg of potassium  Bread (whole wheat) - 1 slice has 70 mg of potassium  Broccoli (raw) -  cup has 145 mg of potassium  Cabbage -  cup has 150 mg of potassium  Carrots (cooked or raw) -  cup has 180 mg of potassium  Cauliflower (raw) -  cup has 150 mg of potassium  Celery (raw) -  cup has 155 mg of potassium  Cereal, bran   flakes -  cup has 120-150 mg  of potassium  Cheese (cottage) -  cup has 110 mg of potassium  Cherries - 10 have 150 mg of potassium  Chocolate - 1 oz bar has 165 mg of potassium  Coffee (brewed) - 6 oz has 90 mg of potassium  Corn -  cup or 1 ear has 195 mg of potassium  Cucumbers -  cup has 80 mg of potassium  Egg - 1 large egg has 60 mg of potassium  Eggplant -  cup has 60 mg of potassium  Endive (raw) -  cup has 80 mg of potassium  English muffin - 1 has 65 mg of potassium  Fish (ocean perch) - 3 oz has 192 mg of potassium  Frankfurter, beef or pork - 1 has 75 mg of potassium  Fruit cocktail -  cup has 115 mg of potassium  Grape juice -  cup has 170 mg of potassium  Grapefruit -  fruit has 175 mg of potassium  Grapes -  cup has 155 mg of potassium  Greens: kale, turnip, collard -  cup has 110-150 mg of potassium  Ice cream or frozen yogurt (chocolate) -  cup has 175 mg of potassium  Ice cream or frozen yogurt (vanilla) -  cup has 120-150 mg of potassium  Lemons, limes - 1 each has 80 mg of potassium  Lettuce - 1 cup has 100 mg of potassium  Mixed vegetables -  cup has 150 mg of potassium  Mushrooms, raw -  cup has 110 mg of potassium  Nuts (walnuts, pecans, or macadamia) - 1 oz has 125 mg of potassium  Oatmeal -  cup has 80 mg of potassium  Okra -  cup has 110 mg of potassium  Onions -  cup has 120 mg of potassium  Peach - 1 has 185 mg of potassium  Peaches (canned) -  cup has 120 mg of potassium  Pears (canned) -  cup has 120 mg of potassium  Peas, green (frozen) -  cup has 90 mg of potassium  Peppers (Green) -  cup has 130 mg of potassium  Peppers (Red) -  cup has 160 mg of potassium  Pineapple juice -  cup has 165 mg of potassium  Pineapple (fresh or canned) -  cup has 100 mg of potassium  Plums - 1 has 105 mg of potassium  Pudding, vanilla -  cup has 150 mg of potassium  Raspberries -  cup has 90 mg of potassium  Rhubarb -  cup has  115 mg of potassium  Rice, wild -  cup has 80 mg of potassium  Shrimp - 3 oz has 155 mg of potassium  Spinach (raw) - 1 cup has 170 mg of potassium  Strawberries -  cup has 125 mg of potassium  Summer squash -  cup has 175-200 mg of potassium  Swiss chard (raw) - 1 cup has 135 mg of potassium  Tangerines - 1 fruit has 140 mg of potassium  Tea, brewed - 6 oz has 65 mg of potassium  Turnips -  cup has 140 mg of potassium  Watermelon -  cup has 85 mg of potassium  Wine (Red, table) - 5 oz has 180 mg of potassium  Wine (White, table) - 5 oz 100 mg of potassium Low in potassium The following foods and beverages have less than 50 mg of potassium per serving.  Bread (white) - 1 slice has 30 mg of potassium    Carbonated beverages - 12 oz has less than 5 mg of potassium  Cheese - 1 oz has 20-30 mg of potassium  Cranberries -  cup has 45 mg of potassium  Cranberry juice cocktail -  cup has 20 mg of potassium  Fats and oils - 1 Tbsp has less than 5 mg of potassium  Hummus - 1 Tbsp has 32 mg of potassium  Nectar (papaya, mango, or pear) -  cup has 35 mg of potassium  Rice (white or brown) -  cup has 50 mg of potassium  Spaghetti or macaroni (cooked) -  cup has 30 mg of potassium  Tortilla, flour or corn - 1 has 50 mg of potassium  Waffle - 1 four-inch waffle has 50 mg of potassium  Water chestnuts -  cup has 40 mg of potassium Summary  Potassium is a mineral found in many foods and drinks. It affects how the heart works, and helps keep fluids and minerals balanced in the body.  The amount of potassium you need each day depends on your age and any existing medical conditions you may have. Your health care provider or dietitian may recommend an amount of potassium that you should have each day. This information is not intended to replace advice given to you by your health care provider. Make sure you discuss any questions you have with your health care provider.  Document Released: 05/01/2005 Document Revised: 08/30/2017 Document Reviewed: 12/12/2016 Elsevier Patient Education  2020 Elsevier Inc.  

## 2019-08-06 ENCOUNTER — Other Ambulatory Visit: Payer: Self-pay

## 2019-08-10 ENCOUNTER — Telehealth: Payer: Self-pay

## 2019-08-10 ENCOUNTER — Other Ambulatory Visit: Payer: Self-pay

## 2019-08-10 ENCOUNTER — Other Ambulatory Visit (INDEPENDENT_AMBULATORY_CARE_PROVIDER_SITE_OTHER): Payer: Medicare PPO

## 2019-08-10 DIAGNOSIS — E559 Vitamin D deficiency, unspecified: Secondary | ICD-10-CM

## 2019-08-10 DIAGNOSIS — Z13220 Encounter for screening for lipoid disorders: Secondary | ICD-10-CM | POA: Diagnosis not present

## 2019-08-10 DIAGNOSIS — R739 Hyperglycemia, unspecified: Secondary | ICD-10-CM

## 2019-08-10 DIAGNOSIS — E291 Testicular hypofunction: Secondary | ICD-10-CM

## 2019-08-10 DIAGNOSIS — E876 Hypokalemia: Secondary | ICD-10-CM

## 2019-08-10 DIAGNOSIS — Z1329 Encounter for screening for other suspected endocrine disorder: Secondary | ICD-10-CM

## 2019-08-10 LAB — LIPID PANEL
Cholesterol: 194 mg/dL (ref 0–200)
HDL: 56.2 mg/dL (ref >39.00)
LDL Cholesterol: 126 mg/dL — ABNORMAL HIGH (ref 0–99)
NonHDL: 137.87
Total CHOL/HDL Ratio: 3
Triglycerides: 60 mg/dL (ref 0.0–149.0)
VLDL: 12 mg/dL (ref 0.0–40.0)

## 2019-08-10 LAB — COMPREHENSIVE METABOLIC PANEL
ALT: 20 U/L (ref 0–53)
AST: 22 U/L (ref 0–37)
Albumin: 4.7 g/dL (ref 3.5–5.2)
Alkaline Phosphatase: 48 U/L (ref 39–117)
BUN: 18 mg/dL (ref 6–23)
CO2: 27 mEq/L (ref 19–32)
Calcium: 9.6 mg/dL (ref 8.4–10.5)
Chloride: 105 mEq/L (ref 96–112)
Creatinine, Ser: 1 mg/dL (ref 0.40–1.50)
GFR: 82.16 mL/min (ref >60.00)
Glucose, Bld: 111 mg/dL — ABNORMAL HIGH (ref 70–99)
Potassium: 4.5 mEq/L (ref 3.5–5.1)
Sodium: 138 mEq/L (ref 135–145)
Total Bilirubin: 1 mg/dL (ref 0.2–1.2)
Total Protein: 7.1 g/dL (ref 6.0–8.3)

## 2019-08-10 LAB — HEMOGLOBIN A1C: Hgb A1c MFr Bld: 5.5 % (ref 4.6–6.5)

## 2019-08-10 LAB — TSH: TSH: 1.76 u[IU]/mL (ref 0.35–4.50)

## 2019-08-10 LAB — VITAMIN D 25 HYDROXY (VIT D DEFICIENCY, FRACTURES): VITD: 115.23 ng/mL (ref 30.00–100.00)

## 2019-08-10 NOTE — Telephone Encounter (Signed)
Agree no vitamin D recommended at this time see result note   Johnny Villa

## 2019-08-10 NOTE — Telephone Encounter (Signed)
10000 IU

## 2019-08-10 NOTE — Telephone Encounter (Signed)
Patient has been taking 10k IU. Patient informed to stop taking vitamin D, he stated he will comply.

## 2019-08-10 NOTE — Telephone Encounter (Signed)
10,000 per day is too much  At most for a daily dose I would say 1000 or 2000 for stablization  For now though, NO VITAMIN D, until follow up with PCP

## 2019-08-10 NOTE — Telephone Encounter (Signed)
Patient has been informed.

## 2019-08-10 NOTE — Telephone Encounter (Signed)
CRITICAL VALUE STICKER  CRITICAL VALUE:Vitamin D 115.23  RECEIVER (on-site recipient of call):Ruie Sendejo Dailey NOTIFIED: 08/10/2019 2:48pm  MESSENGER (representative from lab):Earnest Bailey  MD NOTIFIED: Guse,NP  TIME OF NOTIFICATION:3pm  RESPONSE: see result note

## 2019-08-10 NOTE — Telephone Encounter (Signed)
Johnny Villa --- please call patient and confirm dose of vitamin D he is taking.  Then advise him to stop vitamin D at this time.  Sometimes you can take too much vitamin D and at very high levels in the blood it can be dangerous  LG

## 2019-08-11 ENCOUNTER — Other Ambulatory Visit: Payer: Self-pay | Admitting: Internal Medicine

## 2019-08-11 ENCOUNTER — Encounter: Payer: Self-pay | Admitting: Internal Medicine

## 2019-08-11 DIAGNOSIS — N529 Male erectile dysfunction, unspecified: Secondary | ICD-10-CM

## 2019-08-11 LAB — TESTOSTERONE TOTAL,FREE,BIO, MALES
Albumin: 4.5 g/dL (ref 3.6–5.1)
Sex Hormone Binding: 22 nmol/L (ref 10–50)
Testosterone, Bioavailable: 234.9 ng/dL (ref >=110.0)
Testosterone, Free: 114.2 pg/mL (ref 46.0–224.0)
Testosterone: 595 ng/dL (ref 250–827)

## 2019-08-11 MED ORDER — SILDENAFIL CITRATE 20 MG PO TABS: 40.0000 mg | ORAL_TABLET | Freq: Every day | ORAL | 11 refills | Status: DC

## 2019-08-17 ENCOUNTER — Encounter: Payer: Self-pay | Admitting: *Deleted

## 2019-08-19 ENCOUNTER — Encounter: Payer: Self-pay | Admitting: Internal Medicine

## 2019-08-19 ENCOUNTER — Other Ambulatory Visit: Payer: Self-pay | Admitting: Internal Medicine

## 2019-08-19 DIAGNOSIS — J45990 Exercise induced bronchospasm: Secondary | ICD-10-CM

## 2019-08-20 ENCOUNTER — Encounter: Payer: Self-pay | Admitting: Psychiatry

## 2019-08-20 ENCOUNTER — Ambulatory Visit (INDEPENDENT_AMBULATORY_CARE_PROVIDER_SITE_OTHER): Payer: Medicare PPO | Admitting: Psychiatry

## 2019-08-20 ENCOUNTER — Other Ambulatory Visit: Payer: Self-pay

## 2019-08-20 DIAGNOSIS — F3176 Bipolar disorder, in full remission, most recent episode depressed: Secondary | ICD-10-CM

## 2019-08-20 DIAGNOSIS — F1021 Alcohol dependence, in remission: Secondary | ICD-10-CM

## 2019-08-20 DIAGNOSIS — F5105 Insomnia due to other mental disorder: Secondary | ICD-10-CM

## 2019-08-20 DIAGNOSIS — F4310 Post-traumatic stress disorder, unspecified: Secondary | ICD-10-CM

## 2019-08-20 DIAGNOSIS — F1421 Cocaine dependence, in remission: Secondary | ICD-10-CM

## 2019-08-20 MED ORDER — ESCITALOPRAM OXALATE 10 MG PO TABS: 10.0000 mg | ORAL_TABLET | Freq: Every day | ORAL | 1 refills | Status: DC

## 2019-08-20 MED ORDER — LAMOTRIGINE 200 MG PO TABS: 200.0000 mg | ORAL_TABLET | Freq: Every day | ORAL | 1 refills | Status: DC

## 2019-08-20 MED ORDER — BUSPIRONE HCL 30 MG PO TABS: 30.0000 mg | ORAL_TABLET | Freq: Two times a day (BID) | ORAL | 1 refills | Status: DC

## 2019-08-20 NOTE — Progress Notes (Signed)
Virtual Visit via Video Note  I connected with Johnny Villa on 08/20/19 at 11:00 AM EST by a video enabled telemedicine application and verified that I am speaking with the correct person using two identifiers.   I discussed the limitations of evaluation and management by telemedicine and the availability of in person appointments. The patient expressed understanding and agreed to proceed.     I discussed the assessment and treatment plan with the patient. The patient was provided an opportunity to ask questions and all were answered. The patient agreed with the plan and demonstrated an understanding of the instructions.   The patient was advised to call back or seek an in-person evaluation if the symptoms worsen or if the condition fails to improve as anticipated.   [place] MD OP Progress Note  08/20/2019 12:57 PM Johnny Villa  MRN:  [id number]  Chief Complaint:  Chief Complaint    Follow-up     HPI: Johnny Villa is a 43 year old male, currently lives in [place], disabled, has a history of PTSD, bipolar disorder, panic attacks, alcohol use disorder in remission, migraine headaches, cocaine use disorder in remission was evaluated by telemedicine today.  Patient today reports he is currently doing well with regards to his mood symptoms.  He denies any mood swings.  He reports sleep is good.  He has been taking Ambien every night.  Patient denies any suicidality, homicidality or perceptual disturbances.  He continues to attend AA meetings daily.  He sometimes attend 2 meetings in a single day.  He has been doing in person meetings at this time.  He reports he enjoys it and that helps him a lot.  Patient denies any other concerns today. Visit Diagnosis:    ICD-10-CM   1. PTSD (post-traumatic stress disorder)  F43.10 busPIRone (BUSPAR) 30 MG tablet    escitalopram (LEXAPRO) 10 MG tablet    lamoTRIgine (LAMICTAL) 200 MG tablet   stable  2. Bipolar disorder, in full remission, most  recent episode depressed ([place])  F31.76 busPIRone (BUSPAR) 30 MG tablet    escitalopram (LEXAPRO) 10 MG tablet    lamoTRIgine (LAMICTAL) 200 MG tablet  3. Insomnia due to mental disorder  F51.05 busPIRone (BUSPAR) 30 MG tablet    lamoTRIgine (LAMICTAL) 200 MG tablet   stable  4. Alcohol use disorder, moderate, in early remission ([place])  F10.21   5. Cocaine use disorder, moderate, in sustained remission ([place])  F14.21     Past Psychiatric History: I have reviewed past psychiatric history from my progress note on 05/04/2019.  Past trials of Klonopin, Lexapro, Rexulti, Wellbutrin, lamotrigine, lithium, BuSpar, Vraylar  Past Medical History:  Past Medical History:  Diagnosis Date  . Bipolar disorder [facility])    psychiatrist in [place]  . Blood in stool   . Depression   . Drug abuse in remission [facility])    sober for 6 years  . Fatty liver 01/2018  . H/O alcohol abuse   . Headache    migraines  . Hypertension   . Low testosterone in male     Past Surgical History:  Procedure Laterality Date  . APPENDECTOMY     2006  . COLONOSCOPY WITH PROPOFOL N/A 12/30/2017   Procedure: COLONOSCOPY WITH PROPOFOL;  Surgeon: Lin Landsman, MD;  Location: [facility] ENDOSCOPY;  Service: Gastroenterology;  Laterality: N/A;  . ESOPHAGOGASTRODUODENOSCOPY (EGD) WITH PROPOFOL N/A 12/30/2017   Procedure: ESOPHAGOGASTRODUODENOSCOPY (EGD) WITH PROPOFOL;  Surgeon: Lin Landsman, MD;  Location: [facility] ENDOSCOPY;  Service: Gastroenterology;  Laterality: N/A;  Family Psychiatric History: Reviewed family psychiatric history from my progress note on 05/04/2019  Family History:  Family History  Problem Relation Age of Onset  . Depression Mother   . Alcohol abuse Father   . Cancer Father        prostate dx'ed 34  . COPD Father   . Hyperlipidemia Father   . Hypertension Father     Social History: Reviewed social history from my progress note on 05/04/2019 Social History   Socioeconomic History  . Marital status:  Single    Spouse name: Not on file  . Number of children: Not on file  . Years of education: Not on file  . Highest education level: Not on file  Occupational History  . Occupation: disabled  Social Needs  . Financial resource strain: Not hard at all  . Food insecurity    Worry: Patient refused    Inability: Patient refused  . Transportation needs    Medical: Patient refused    Non-medical: Patient refused  Tobacco Use  . Smoking status: Former Smoker    Types: Cigarettes    Quit date: 2020    Years since quitting: 0.8  . Smokeless tobacco: Never Used  Substance and Sexual Activity  . Alcohol use: No    Frequency: Never    Comment: former quit 03/14/2012  . Drug use: No    Comment: former quit cocaine 03/14/2012   . Sexual activity: Yes  Lifestyle  . Physical activity    Days per week: Patient refused    Minutes per session: Patient refused  . Stress: Not on file  Relationships  . Social Herbalist on phone: Patient refused    Gets together: Patient refused    Attends religious service: Patient refused    Active member of club or organization: Patient refused    Attends meetings of clubs or organizations: Patient refused    Relationship status: Patient refused  Other Topics Concern  . 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     Allergies: No Known Allergies  Metabolic Disorder Labs: Lab Results  Component Value Date   HGBA1C 5.5 08/10/2019   No results found for: PROLACTIN Lab Results  Component Value Date   CHOL 194 08/10/2019   TRIG 60.0 08/10/2019   HDL 56.20 08/10/2019   CHOLHDL 3 08/10/2019   VLDL 12.0 08/10/2019   LDLCALC 126 (H) 08/10/2019   LDLCALC 114 (H) 10/25/2017   Lab Results  Component Value Date   TSH 1.76 08/10/2019   TSH 0.97 06/23/2018    Therapeutic Level Labs: Lab Results  Component Value Date   LITHIUM 0.4 (L) 06/23/2018    LITHIUM 0.8 10/25/2017   No results found for: VALPROATE No components found for:  CBMZ  Current Medications: Current Outpatient Medications  Medication Sig Dispense Refill  . albuterol (VENTOLIN HFA) 108 (90 Base) MCG/ACT inhaler Inhale 2 puffs into the lungs every 4 (four) hours as needed for wheezing or shortness of breath. Or 30 minutes before exercise 18 g 11  . busPIRone (BUSPAR) 30 MG tablet Take 1 tablet (30 mg total) by mouth 2 (two) times daily. 180 tablet 1  . clomiPHENE (CLOMID) 50 MG tablet Take by mouth.    . escitalopram (LEXAPRO) 10 MG tablet Take 1 tablet (10 mg total) by mouth  daily. 90 tablet 1  . lamoTRIgine (LAMICTAL) 200 MG tablet Take 1 tablet (200 mg total) by mouth daily. 90 tablet 1  . ondansetron (ZOFRAN-ODT) 4 MG disintegrating tablet Take 1 tablet (4 mg total) by mouth every 6 (six) hours as needed for nausea or vomiting. 60 tablet 0  . sildenafil (REVATIO) 20 MG tablet Take 2 tablets (40 mg total) by mouth daily. Prn 60 tablet 11  . SUMAtriptan (IMITREX) 100 MG tablet May repeat in 2 hours if not helping. Max dose 200 mg in 1 day. Do not take more than 2x per week 9 tablet 11  . zolpidem (AMBIEN) 10 MG tablet Take 1 tablet (10 mg total) by mouth at bedtime as needed for sleep. 90 tablet 0   No current facility-administered medications for this visit.      Musculoskeletal: Strength & Muscle Tone: UTA Gait & Station: normal Patient leans: N/A  Psychiatric Specialty Exam: Review of Systems  Psychiatric/Behavioral: Negative for depression, hallucinations, substance abuse and suicidal ideas. The patient is not nervous/anxious.   All other systems reviewed and are negative.   There were no vitals taken for this visit.There is no height or weight on file to calculate BMI.  General Appearance: Casual  Eye Contact:  Fair  Speech:  Normal Rate  Volume:  Normal  Mood:  Euthymic  Affect:  Congruent  Thought Process:  Goal Directed and Descriptions of  Associations: Intact  Orientation:  Full (Time, Place, and Person)  Thought Content: Logical   Suicidal Thoughts:  No  Homicidal Thoughts:  No  Memory:  Immediate;   Fair Recent;   Fair Remote;   Fair  Judgement:  Fair  Insight:  Fair  Psychomotor Activity:  Normal  Concentration:  Concentration: Fair and Attention Span: Fair  Recall:  [employer] of Knowledge: Fair  Language: Fair  Akathisia:  No  Handed:  Right  AIMS (if indicated): Denies tremors, rigidity  Assets:  Communication Skills Desire for Improvement Housing Social Support Transportation Vocational/Educational  ADL's:  Intact  Cognition: WNL  Sleep:  Fair   Screenings: AUDIT     Admission (Discharged) from 06/01/2019 in [place]  Alcohol Use Disorder Identification Test Final Score (AUDIT)  33    PHQ2-9     Office Visit from 07/09/2019 in [place] from 06/26/2019 in [facility] Office Visit from 06/24/2018 in [place] Office Visit from 06/19/2018 in [place] Procedure visit from 05/19/2018 in [place]  PHQ-2 Total Score  2  0  0  0  0  PHQ-9 Total Score  4  -  -  -  -       Assessment and Plan: Johnny Villa is a 44 year old male, divorced, lives in [place], has a history of PTSD, panic attacks, bipolar disorder, alcohol and cocaine use disorder, migraine headaches was evaluated by telemedicine today.  Patient is biologically predisposed given his history of substance abuse problems in the past as well as history of trauma.  Patient continues to attend AA meetings and currently has a sponsor.  Patient continues to do well on the current medication regimen.  Plan as noted below.  Plan PTSD-stable Lexapro 10 mg p.o. daily Ambien 5 to 10 mg p.o. nightly. Discussed with patient to limit use since it is  habit-forming.  Advised to use melatonin over-the-counter.  Panic attacks-stable Lexapro as prescribed  Bipolar  disorder in remission Lamictal 200 mg p.o. daily  Alcohol and cocaine use disorder in remission-patient currently attends AA meetings.  He has been sober since May 29, 2019.  He currently has a sponsor.  Follow-up in clinic in 2 months or sooner if needed.  January 22 at 11 AM  I have spent atleast 15 minutes non face to face with patient today. More than 50 % of the time was spent for psychoeducation and supportive psychotherapy and care coordination. This note was generated in part or whole with voice recognition software. Voice recognition is usually quite accurate but there are transcription errors that can and very often do occur. I apologize for any typographical errors that were not detected and corrected.       Ursula Alert, MD 08/20/2019, 12:57 PM

## 2019-08-21 ENCOUNTER — Ambulatory Visit: Payer: Medicare PPO | Admitting: Pulmonary Disease

## 2019-08-21 ENCOUNTER — Other Ambulatory Visit: Payer: Self-pay | Admitting: Pulmonary Disease

## 2019-08-21 ENCOUNTER — Encounter: Payer: Self-pay | Admitting: Pulmonary Disease

## 2019-08-21 VITALS — BP 128/78 | HR 65 | Temp 97.9°F | Ht 71.0 in | Wt 214.0 lb

## 2019-08-21 DIAGNOSIS — R0789 Other chest pain: Secondary | ICD-10-CM

## 2019-08-21 DIAGNOSIS — R0600 Dyspnea, unspecified: Secondary | ICD-10-CM | POA: Diagnosis not present

## 2019-08-21 DIAGNOSIS — R0609 Other forms of dyspnea: Secondary | ICD-10-CM

## 2019-08-21 DIAGNOSIS — J45990 Exercise induced bronchospasm: Secondary | ICD-10-CM

## 2019-08-21 DIAGNOSIS — Z20828 Contact with and (suspected) exposure to other viral communicable diseases: Secondary | ICD-10-CM | POA: Diagnosis not present

## 2019-08-21 MED ORDER — IPRATROPIUM-ALBUTEROL 20-100 MCG/ACT IN AERS: 1.0000 | INHALATION_SPRAY | Freq: Four times a day (QID) | RESPIRATORY_TRACT | Status: DC | PRN

## 2019-08-21 MED ORDER — MONTELUKAST SODIUM 10 MG PO TABS: 10.0000 mg | ORAL_TABLET | Freq: Every day | ORAL | 3 refills | Status: DC

## 2019-08-21 NOTE — Patient Instructions (Addendum)
1.  We will order breathing tests and an exercise test.  2.  We will placed you on montelukast (Singulair) 1 tablet at bedtime.  Please discontinue use if you notice any issues with depression or change in mood.  Some of that is the case.  3.  Instead of albuterol we will switch you to Combivent 20 to 30 minutes before exercise 1 puff inhaled.  4.  We will see her in follow-up in 4 to 6 weeks time please contact us prior to that time should any new difficulties arise.

## 2019-08-21 NOTE — Progress Notes (Signed)
Subjective:    Patient ID: Johnny Villa, male    DOB: 1976/01/20, 43 y.o.   MRN: [id number]  HPI Patient is a 43 year old former smoker (quit 1999) who presents for evaluation of exercise-induced bronchospasm.  He is kindly referred by Dr. Olivia Mackie Mclean-Scocuzza.  The patient states that he has had similar issues previously.  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  He notes that since the weather turned colder his symptoms have worsened.  He describes the symptoms as immediate upon starting exercise in cold weather.  He notices pain or discomfort in his neck which then goes to his mid chest.  He then developed shortness of breath and cough. The symptoms can last anywhere between 30 minutes to an hour.  Prophylactic use of albuterol has not helped the issue significantly.  He has not had any fevers, chills or sweats.  He does describe orthopnea and paroxysmal nocturnal dyspnea.  No lower extremity edema.  He does not recall a recent cardiac evaluation.  He does not use mouth covering to keep his his inspired air humidified and warmer (he does not wear an cold air exercise mask).  He does not note any significant allergies.  Patient has had issues with substance and alcohol abuse in the past however has been sober since 2013.  He smoked briefly between 38 through 1999.  He did use chewing tobacco but has discontinued this as well.  Past medical history, past surgical history and family history have been reviewed.  They are as noted.  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  He has no unusual hobbies nor pets.  Does have a pet cat.  Review of Systems A 10 point review of systems was performed and it is as noted above otherwise negative.  BP 128/78 (BP Location: Left Arm, Cuff Size: Normal)    Pulse 65    Temp 97.9 F (36.6 C) (Temporal)    Ht 5\' 11"  (1.803 m)    Wt 214 lb (97.1 kg)    SpO2 96%    BMI 29.85  kg/m    Objective:   Physical Exam Vitals signs and nursing note reviewed.  Constitutional:      General: He is not in acute distress.    Appearance: Normal appearance. He is normal weight. He is not ill-appearing.  HENT:     Head: Normocephalic and atraumatic.     Right Ear: External ear normal.     Left Ear: External ear normal.     Nose:     Comments: Nose/mouth/throat not examined due to masking requirements for COVID 19. Eyes:     General: No scleral icterus.    Conjunctiva/sclera: Conjunctivae normal.     Pupils: Pupils are equal, round, and reactive to light.  Neck:     Musculoskeletal: Neck supple.  Cardiovascular:     Rate and Rhythm: Normal rate.     Pulses: Normal pulses.     Heart sounds: Normal heart sounds. No murmur.  Pulmonary:     Effort: Pulmonary effort is normal. No respiratory distress.     Breath sounds: Normal breath sounds.  Abdominal:     General: Abdomen is flat. There is no distension.  Musculoskeletal: Normal range of motion.     Right lower leg: No edema.     Left lower leg: No edema.  Skin:    General: Skin is warm and dry.  Neurological:  General: No focal deficit present.     Mental Status: He is alert and oriented to person, place, and time.  Psychiatric:        Mood and Affect: Mood normal.        Behavior: Behavior normal.    Most recent chest imaging is a chest x-ray from a year ago showed mild peribronchial thickening otherwise normal.    Assessment & Plan:   1.  Exercise-induced bronchospasm: Certain aspects of his history support exercise-induced bronchospasm however there is an element of immediate reaction with neck and chest discomfort that is somewhat atypical of this.  Will need to exclude other issues such as coronary artery disease.  He does have a history of this issue in the past so we will try to optimize his treatment in this regard.  The most important intervention will be to wear an exercise facemask to maintain  his inspired air warm and with normal moisture to prevent the bronchospasm.  Leukotriene inhibitors are very useful in this regard and we will give him a trial of Singulair, he was given instructions to discontinue this medication if he notices any issues with decompensation of his bipolar depression.  The patient states that he has been doing well in this regard and is willing to give Singulair a try.  Again he was warned of potential symptoms and to discontinue the medication if he notices issues with depression or change in mood.  He is also to contact us if he has to do that.  With regards to his inhaler Combivent may be a better choice to prophylax against bronchospasm so we will give this a try.  He was advised that if his insurance company does not cover it he may stay on the albuterol preemptively.  We will obtain pulmonary function testing.  We will see the patient in follow-up in 4 to 6 weeks time he is to contact us prior to that time should any new difficulties arise.  2.  Chest tightness and dyspnea on exertion: Some aspects of his history are somewhat worrisome for coronary artery disease particularly his orthopnea, paroxysmal nocturnal dyspnea on occasion and immediate chest tightness and dyspnea on exertion which is not the norm for exercise-induced bronchospasm.  We will order a cardiac stress test, treadmill.  Thank you for allowing me to dissipate in this patient's care.   Renold Don, MD [facility]   This note was dictated using voice recognition software/Dragon.  Despite best efforts to proofread, errors can occur which can change the meaning.  Any change was purely unintentional.

## 2019-08-28 ENCOUNTER — Other Ambulatory Visit: Payer: Self-pay

## 2019-08-28 ENCOUNTER — Telehealth: Payer: Self-pay | Admitting: Pulmonary Disease

## 2019-08-28 NOTE — Telephone Encounter (Signed)
Pt is aware of date/time of covid test for exercise stress test.  Nothing further is needed.

## 2019-08-31 ENCOUNTER — Other Ambulatory Visit: Payer: Self-pay

## 2019-08-31 ENCOUNTER — Other Ambulatory Visit
Admission: RE | Admit: 2019-08-31 | Discharge: 2019-08-31 | Disposition: A | Discharge status: Home / Self Care | Payer: Medicare PPO | Source: Ambulatory Visit | Attending: Pulmonary Disease | Admitting: Pulmonary Disease

## 2019-08-31 DIAGNOSIS — Z20828 Contact with and (suspected) exposure to other viral communicable diseases: Secondary | ICD-10-CM | POA: Diagnosis not present

## 2019-08-31 DIAGNOSIS — Z01812 Encounter for preprocedural laboratory examination: Secondary | ICD-10-CM | POA: Diagnosis not present

## 2019-08-31 LAB — SARS CORONAVIRUS 2 (TAT 6-24 HRS): SARS Coronavirus 2: NEGATIVE

## 2019-09-01 ENCOUNTER — Ambulatory Visit: Admission: RE | Admit: 2019-09-01 | Payer: Medicare PPO | Source: Ambulatory Visit

## 2019-09-01 ENCOUNTER — Other Ambulatory Visit: Payer: Self-pay | Admitting: Internal Medicine

## 2019-09-08 ENCOUNTER — Emergency Department
Admission: EM | Admit: 2019-09-08 | Discharge: 2019-09-09 | Disposition: A | Discharge status: Other Acute Inpatient Hospital | Payer: Medicare PPO | Attending: Student in an Organized Health Care Education/Training Program | Admitting: Student in an Organized Health Care Education/Training Program

## 2019-09-08 ENCOUNTER — Other Ambulatory Visit: Payer: Self-pay

## 2019-09-08 DIAGNOSIS — F4310 Post-traumatic stress disorder, unspecified: Secondary | ICD-10-CM | POA: Diagnosis present

## 2019-09-08 DIAGNOSIS — T50992A Poisoning by other drugs, medicaments and biological substances, intentional self-harm, initial encounter: Secondary | ICD-10-CM | POA: Diagnosis not present

## 2019-09-08 DIAGNOSIS — I10 Essential (primary) hypertension: Secondary | ICD-10-CM | POA: Insufficient documentation

## 2019-09-08 DIAGNOSIS — F10130 Alcohol abuse with withdrawal, uncomplicated: Secondary | ICD-10-CM | POA: Diagnosis not present

## 2019-09-08 DIAGNOSIS — T4272XA Poisoning by unspecified antiepileptic and sedative-hypnotic drugs, intentional self-harm, initial encounter: Secondary | ICD-10-CM | POA: Diagnosis not present

## 2019-09-08 DIAGNOSIS — J45990 Exercise induced bronchospasm: Secondary | ICD-10-CM | POA: Diagnosis present

## 2019-09-08 DIAGNOSIS — E162 Hypoglycemia, unspecified: Secondary | ICD-10-CM | POA: Diagnosis not present

## 2019-09-08 DIAGNOSIS — M25511 Pain in right shoulder: Secondary | ICD-10-CM | POA: Diagnosis present

## 2019-09-08 DIAGNOSIS — R58 Hemorrhage, not elsewhere classified: Secondary | ICD-10-CM | POA: Diagnosis not present

## 2019-09-08 DIAGNOSIS — G43709 Chronic migraine without aura, not intractable, without status migrainosus: Secondary | ICD-10-CM | POA: Diagnosis present

## 2019-09-08 DIAGNOSIS — F1010 Alcohol abuse, uncomplicated: Secondary | ICD-10-CM | POA: Diagnosis not present

## 2019-09-08 DIAGNOSIS — R251 Tremor, unspecified: Secondary | ICD-10-CM | POA: Diagnosis present

## 2019-09-08 DIAGNOSIS — M542 Cervicalgia: Secondary | ICD-10-CM | POA: Diagnosis present

## 2019-09-08 DIAGNOSIS — F1020 Alcohol dependence, uncomplicated: Secondary | ICD-10-CM | POA: Diagnosis present

## 2019-09-08 DIAGNOSIS — F1421 Cocaine dependence, in remission: Secondary | ICD-10-CM | POA: Diagnosis present

## 2019-09-08 DIAGNOSIS — M47812 Spondylosis without myelopathy or radiculopathy, cervical region: Secondary | ICD-10-CM | POA: Diagnosis present

## 2019-09-08 DIAGNOSIS — F19939 Other psychoactive substance use, unspecified with withdrawal, unspecified: Secondary | ICD-10-CM | POA: Diagnosis not present

## 2019-09-08 DIAGNOSIS — R0902 Hypoxemia: Secondary | ICD-10-CM | POA: Diagnosis not present

## 2019-09-08 DIAGNOSIS — R7989 Other specified abnormal findings of blood chemistry: Secondary | ICD-10-CM | POA: Diagnosis present

## 2019-09-08 DIAGNOSIS — E291 Testicular hypofunction: Secondary | ICD-10-CM | POA: Diagnosis present

## 2019-09-08 DIAGNOSIS — Z87891 Personal history of nicotine dependence: Secondary | ICD-10-CM | POA: Insufficient documentation

## 2019-09-08 DIAGNOSIS — F319 Bipolar disorder, unspecified: Secondary | ICD-10-CM | POA: Insufficient documentation

## 2019-09-08 DIAGNOSIS — J45909 Unspecified asthma, uncomplicated: Secondary | ICD-10-CM | POA: Diagnosis not present

## 2019-09-08 DIAGNOSIS — Z79899 Other long term (current) drug therapy: Secondary | ICD-10-CM | POA: Diagnosis not present

## 2019-09-08 DIAGNOSIS — F3130 Bipolar disorder, current episode depressed, mild or moderate severity, unspecified: Secondary | ICD-10-CM | POA: Diagnosis not present

## 2019-09-08 DIAGNOSIS — F3132 Bipolar disorder, current episode depressed, moderate: Secondary | ICD-10-CM | POA: Diagnosis present

## 2019-09-08 DIAGNOSIS — G8929 Other chronic pain: Secondary | ICD-10-CM | POA: Diagnosis present

## 2019-09-08 DIAGNOSIS — M5030 Other cervical disc degeneration, unspecified cervical region: Secondary | ICD-10-CM | POA: Diagnosis present

## 2019-09-08 DIAGNOSIS — G4700 Insomnia, unspecified: Secondary | ICD-10-CM | POA: Diagnosis present

## 2019-09-08 DIAGNOSIS — F314 Bipolar disorder, current episode depressed, severe, without psychotic features: Secondary | ICD-10-CM | POA: Diagnosis present

## 2019-09-08 DIAGNOSIS — N529 Male erectile dysfunction, unspecified: Secondary | ICD-10-CM | POA: Diagnosis present

## 2019-09-08 DIAGNOSIS — F5105 Insomnia due to other mental disorder: Secondary | ICD-10-CM | POA: Diagnosis present

## 2019-09-08 DIAGNOSIS — E785 Hyperlipidemia, unspecified: Secondary | ICD-10-CM | POA: Diagnosis present

## 2019-09-08 DIAGNOSIS — F1021 Alcohol dependence, in remission: Secondary | ICD-10-CM

## 2019-09-08 DIAGNOSIS — F10929 Alcohol use, unspecified with intoxication, unspecified: Secondary | ICD-10-CM | POA: Diagnosis present

## 2019-09-08 DIAGNOSIS — Z03818 Encounter for observation for suspected exposure to other biological agents ruled out: Secondary | ICD-10-CM | POA: Diagnosis not present

## 2019-09-08 DIAGNOSIS — Z20828 Contact with and (suspected) exposure to other viral communicable diseases: Secondary | ICD-10-CM | POA: Insufficient documentation

## 2019-09-08 DIAGNOSIS — E161 Other hypoglycemia: Secondary | ICD-10-CM | POA: Diagnosis not present

## 2019-09-08 DIAGNOSIS — T50902A Poisoning by unspecified drugs, medicaments and biological substances, intentional self-harm, initial encounter: Secondary | ICD-10-CM | POA: Diagnosis present

## 2019-09-08 DIAGNOSIS — G43909 Migraine, unspecified, not intractable, without status migrainosus: Secondary | ICD-10-CM | POA: Diagnosis present

## 2019-09-08 LAB — COMPREHENSIVE METABOLIC PANEL
ALT: 17 U/L (ref 0–44)
AST: 28 U/L (ref 15–41)
Albumin: 3.9 g/dL (ref 3.5–5.0)
Alkaline Phosphatase: 45 U/L (ref 38–126)
Anion gap: 13 (ref 5–15)
BUN: 17 mg/dL (ref 6–20)
CO2: 21 mmol/L — ABNORMAL LOW (ref 22–32)
Calcium: 8.3 mg/dL — ABNORMAL LOW (ref 8.9–10.3)
Chloride: 100 mmol/L (ref 98–111)
Creatinine, Ser: 1.1 mg/dL (ref 0.61–1.24)
GFR calc Af Amer: >60 mL/min (ref >60)
GFR calc non Af Amer: >60 mL/min (ref >60)
Glucose, Bld: 196 mg/dL — ABNORMAL HIGH (ref 70–99)
Potassium: 3.4 mmol/L — ABNORMAL LOW (ref 3.5–5.1)
Sodium: 134 mmol/L — ABNORMAL LOW (ref 135–145)
Total Bilirubin: 1.2 mg/dL (ref 0.3–1.2)
Total Protein: 6.9 g/dL (ref 6.5–8.1)

## 2019-09-08 LAB — MAGNESIUM: Magnesium: 1.8 mg/dL (ref 1.7–2.4)

## 2019-09-08 LAB — URINE DRUG SCREEN, QUALITATIVE (ARMC ONLY)
Amphetamines, Ur Screen: NOT DETECTED
Barbiturates, Ur Screen: NOT DETECTED
Benzodiazepine, Ur Scrn: POSITIVE — AB
Cannabinoid 50 Ng, Ur ~~LOC~~: NOT DETECTED
Cocaine Metabolite,Ur ~~LOC~~: NOT DETECTED
MDMA (Ecstasy)Ur Screen: NOT DETECTED
Methadone Scn, Ur: NOT DETECTED
Opiate, Ur Screen: NOT DETECTED
Phencyclidine (PCP) Ur S: NOT DETECTED
Tricyclic, Ur Screen: NOT DETECTED

## 2019-09-08 LAB — CBC
HCT: 39.5 % (ref 39.0–52.0)
Hemoglobin: 13.8 g/dL (ref 13.0–17.0)
MCH: 31.7 pg (ref 26.0–34.0)
MCHC: 34.9 g/dL (ref 30.0–36.0)
MCV: 90.8 fL (ref 80.0–100.0)
Platelets: 183 10*3/uL (ref 150–400)
RBC: 4.35 MIL/uL (ref 4.22–5.81)
RDW: 12.4 % (ref 11.5–15.5)
WBC: 6.9 10*3/uL (ref 4.0–10.5)
nRBC: 0 % (ref 0.0–0.2)

## 2019-09-08 LAB — GLUCOSE, CAPILLARY: Glucose-Capillary: 173 mg/dL — ABNORMAL HIGH (ref 70–99)

## 2019-09-08 LAB — ACETAMINOPHEN LEVEL: Acetaminophen (Tylenol), Serum: <10 ug/mL — ABNORMAL LOW (ref 10–30)

## 2019-09-08 LAB — SALICYLATE LEVEL: Salicylate Lvl: <7 mg/dL (ref 2.8–30.0)

## 2019-09-08 LAB — ETHANOL: Alcohol, Ethyl (B): <10 mg/dL (ref <10)

## 2019-09-08 MED ORDER — LORAZEPAM 2 MG/ML IJ SOLN
0.0000 mg | Freq: Four times a day (QID) | INTRAMUSCULAR | Status: DC
  Administered 2019-09-08: 2 mg via INTRAVENOUS
  Filled 2019-09-08: qty 1

## 2019-09-08 MED ORDER — CHLORDIAZEPOXIDE HCL 25 MG PO CAPS: 25.0000 mg | ORAL_CAPSULE | Freq: Four times a day (QID) | ORAL | Status: DC

## 2019-09-08 MED ORDER — CHLORDIAZEPOXIDE HCL 25 MG PO CAPS
25.0000 mg | ORAL_CAPSULE | Freq: Four times a day (QID) | ORAL | Status: DC
  Administered 2019-09-08 – 2019-09-09 (×5): 25 mg via ORAL
  Filled 2019-09-08 (×5): qty 1

## 2019-09-08 MED ORDER — LORAZEPAM 2 MG PO TABS: 0.0000 mg | ORAL_TABLET | Freq: Four times a day (QID) | ORAL | Status: DC

## 2019-09-08 MED ORDER — SODIUM CHLORIDE 0.9 % IV BOLUS
1000.0000 mL | Freq: Once | INTRAVENOUS | Status: AC
  Administered 2019-09-08: 1000 mL via INTRAVENOUS

## 2019-09-08 MED ORDER — THIAMINE HCL 100 MG/ML IJ SOLN
100.0000 mg | Freq: Every day | INTRAMUSCULAR | Status: DC
  Administered 2019-09-08: 100 mg via INTRAVENOUS
  Filled 2019-09-08: qty 2

## 2019-09-08 MED ORDER — LORAZEPAM 2 MG PO TABS: 0.0000 mg | ORAL_TABLET | Freq: Two times a day (BID) | ORAL | Status: DC

## 2019-09-08 MED ORDER — VITAMIN B-1 100 MG PO TABS
100.0000 mg | ORAL_TABLET | Freq: Every day | ORAL | Status: DC
  Administered 2019-09-09: 100 mg via ORAL
  Filled 2019-09-08: qty 1

## 2019-09-08 MED ORDER — LORAZEPAM 2 MG/ML IJ SOLN: 0.0000 mg | Freq: Two times a day (BID) | INTRAMUSCULAR | Status: DC

## 2019-09-08 NOTE — ED Notes (Signed)
BEHAVIORAL HEALTH ROUNDING Patient sleeping: No. Patient alert and oriented: yes Behavior appropriate: Yes  Tremors present.  ; If no, describe:  Nutrition and fluids offered: yes Toileting and hygiene offered: Yes  Sitter present: q15 minute observations  Law enforcement present: Yes  [employer]

## 2019-09-08 NOTE — ED Notes (Signed)

## 2019-09-08 NOTE — ED Notes (Signed)
Gave pt food tray with juice. 

## 2019-09-08 NOTE — Consult Note (Signed)
[facility] Consult   Reason for Consult:  Alcohol Intoxication and Withdrawal Referring Physician:  Dr. Kerman Passey Patient Identification: Zaccheus Valesquez MRN:  [id number] Principal Diagnosis: <principal problem not specified> Diagnosis:  Active Problems:   Hypogonadism in male   Low testosterone   Erectile dysfunction   Bipolar I disorder, most recent episode depressed ([place])   Cervicalgia   Chronic right shoulder pain   Cervical spondylosis without myelopathy (C4,5,6,7)   Cervical facet joint syndrome   DDD (degenerative disc disease), cervical   Hyperlipidemia   Migraine   Chronic migraine without aura without status migrainosus, not intractable   Tremor   PTSD (post-traumatic stress disorder)   Alcohol use disorder, moderate, in early remission ([place])   Bipolar affective disorder, depressed, severe ([place])   Intentional drug overdose ([place])   Cocaine use disorder, moderate, in sustained remission ([place])   Insomnia   Alcohol use disorder, moderate, dependence ([place])   Exercise-induced asthma   Total Time spent with patient: 30 minutes  Subjective: "I took some pills with the intention of harming myself." Nathane Saballos is a 43 y.o. male patient admitted with *Presented to [facility] ED via EMS from home with reports of withdrawal from alcohol. The patient verbalized that he had overdosed on Lamictal (approximately ten pills along with an unknown name or amount of another drug).  Per EMS, the patient was experiencing some DTs symptoms, which he was given 2 mg of Versed and 4 mg Zofran on the scene. The patient was seen face-to-face by this provider; chart reviewed and consulted with Dr. Quentin Cornwall on 09/08/2019 due to the patient's care. It was discussed with the EDP that the patient does meet criteria to be admitted to the psychiatric inpatient unit.  The patient is alert and oriented x 3, calm, cooperative, and mood-congruent with affect on evaluation. The patient does not  appear to be responding to internal or external stimuli. Neither is the patient presenting with any delusional thinking. The patient denies auditory or visual hallucinations. The patient denies suicidal, homicidal, or self-harm ideations.  The patient discusses his behaviors today (12.08.20) is due to his diagnosis of bipolar.  He states, "sometimes I do not know why I do what I do."  The patient expressed that he can only believe that it is due to his diagnosis.  The patient voice his last hospitalization was August/September 2020.  He disclosed it was related to the current behavior on this visit.  The patient expressed he had been sober for six years and relapsed a year ago. The patient is not presenting with any psychotic or paranoid behaviors. During an encounter with the patient, he was able to answer most questions appropriately.  Plan: The patient is a safety risk to self and does require psychiatric inpatient admission for stabilization and treatment.  HPI: Per Dr. Kerman Passey; Kache Sebestyen is a 43 y.o. male with a past medical history of bipolar, substance abuse, alcohol abuse, presents to the emergency department for withdrawal symptoms and suicide attempt.  According to the patient he has been recently sober but relapsed and has been drinking heavily, last drink was early this morning.  Patient states he has been depressed because of this and took a handful of Lamictal this morning to kill himself.  Denies any medical complaints such as fever cough vomiting.  Past Psychiatric History:  Bipolar disorder ([place]) Depression Drug abuse in remission ([place]) H/O alcohol abuse  Risk to Self: Suicidal Ideation: Yes-Currently Present Suicidal Intent: Yes-Currently Present Is patient  at risk for suicide?: Yes Suicidal Plan?: Yes-Currently Present Specify Current Suicidal Plan: Overdose on medications Access to Means: Yes Specify Access to Suicidal Means: Have medications What has been your use  of drugs/alcohol within the last 12 months?: Alcohol How many times?: 1 Other Self Harm Risks: Active alcohol abuse Triggers for Past Attempts: Other (Comment)(Chronic relapse) Intentional Self Injurious Behavior: None Risk to Others: Homicidal Ideation: No Thoughts of Harm to Others: No Current Homicidal Intent: No Current Homicidal Plan: No Access to Homicidal Means: No Identified Victim: Reports of none History of harm to others?: No Assessment of Violence: None Noted Violent Behavior Description: Reports of none Does patient have access to weapons?: No Criminal Charges Pending?: No Does patient have a court date: No Prior Inpatient Therapy: Prior Inpatient Therapy: Yes Prior Therapy Dates: 05/2019 Prior Therapy Facilty/Provider(s): [facility] Reason for Treatment: Bipolar Prior Outpatient Therapy: Prior Outpatient Therapy: Yes Prior Therapy Dates: Current Prior Therapy Facilty/Provider(s): [facility](Dr. Eappen) Reason for Treatment: Bipolar Does patient have an ACCT team?: No Does patient have Intensive In-House Services?  : No Does patient have [facility] services? : No Does patient have P4CC services?: No  Past Medical History:  Past Medical History:  Diagnosis Date  . Bipolar disorder [facility])    psychiatrist in [place]  . Blood in stool   . Depression   . Drug abuse in remission [facility])    sober for 6 years  . Fatty liver 01/2018  . H/O alcohol abuse   . Headache    migraines  . Hypertension   . Low testosterone in male     Past Surgical History:  Procedure Laterality Date  . APPENDECTOMY     2006  . COLONOSCOPY WITH PROPOFOL N/A 12/30/2017   Procedure: COLONOSCOPY WITH PROPOFOL;  Surgeon: Lin Landsman, MD;  Location: [facility] ENDOSCOPY;  Service: Gastroenterology;  Laterality: N/A;  . ESOPHAGOGASTRODUODENOSCOPY (EGD) WITH PROPOFOL N/A 12/30/2017   Procedure: ESOPHAGOGASTRODUODENOSCOPY (EGD) WITH PROPOFOL;  Surgeon: Lin Landsman, MD;   Location[facility] ENDOSCOPY;  Service: Gastroenterology;  Laterality: N/A;   Family History:  Family History  Problem Relation Age of Onset  . Depression Mother   . Alcohol abuse Father   . Cancer Father        prostate dx'ed 40  . COPD Father   . Hyperlipidemia Father   . Hypertension Father    Family Psychiatric  History: History reviewed. No pertinent family psychiatric history Social History:  Social History   Substance and Sexual Activity  Alcohol Use Not Currently  . Frequency: Never   Comment: former quit 03/14/2012     Social History   Substance and Sexual Activity  Drug Use Not Currently   Comment: former quit cocaine 03/14/2012     Social History   Socioeconomic History  . Marital status: Single    Spouse name: Not on file  . Number of children: Not on file  . Years of education: Not on file  . Highest education level: Not on file  Occupational History  . Occupation: disabled  Social Needs  . Financial resource strain: Not hard at all  . Food insecurity    Worry: Patient refused    Inability: Patient refused  . Transportation needs    Medical: Patient refused    Non-medical: Patient refused  Tobacco Use  . Smoking status: Former Smoker    Packs/day: 0.25    Years: 2.00    Pack years: 0.50    Types: Cigarettes  Quit date: 75    Years since quitting: 21.9  . Smokeless tobacco: Former Systems developer    Types: Chew  Substance and Sexual Activity  . Alcohol use: Not Currently    Frequency: Never    Comment: former quit 03/14/2012  . Drug use: Not Currently    Comment: former quit cocaine 03/14/2012   . Sexual activity: Yes  Lifestyle  . Physical activity    Days per week: Patient refused    Minutes per session: Patient refused  . Stress: Not on file  Relationships  . Social Herbalist on phone: Patient refused    Gets together: Patient refused    Attends religious service: Patient refused    Active member of club or organization: Patient  refused    Attends meetings of clubs or organizations: Patient refused    Relationship status: Patient refused  Other Topics Concern  . 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    Additional Social History:    Allergies:  No Known Allergies  Labs:  Results for orders placed or performed during the hospital encounter of 09/08/19 (from the past 48 hour(s))  Glucose, capillary     Status: Abnormal   Collection Time: 09/08/19  1:39 PM  Result Value Ref Range   Glucose-Capillary 173 (H) 70 - 99 mg/dL  CBC     Status: None   Collection Time: 09/08/19  1:43 PM  Result Value Ref Range   WBC 6.9 4.0 - 10.5 K/uL   RBC 4.35 4.22 - 5.81 MIL/uL   Hemoglobin 13.8 13.0 - 17.0 g/dL   HCT 39.5 39.0 - 52.0 %   MCV 90.8 80.0 - 100.0 fL   MCH 31.7 26.0 - 34.0 pg   MCHC 34.9 30.0 - 36.0 g/dL   RDW 12.4 11.5 - 15.5 %   Platelets 183 150 - 400 K/uL   nRBC 0.0 0.0 - 0.2 %    Comment: Performed at [facility], [address] [place], [place] [address]  CMP     Status: Abnormal   Collection Time: 09/08/19  1:43 PM  Result Value Ref Range   Sodium 134 (L) 135 - 145 mmol/L   Potassium 3.4 (L) 3.5 - 5.1 mmol/L   Chloride 100 98 - 111 mmol/L   CO2 21 (L) 22 - 32 mmol/L   Glucose, Bld 196 (H) 70 - 99 mg/dL   BUN 17 6 - 20 mg/dL   Creatinine, Ser 1.10 0.61 - 1.24 mg/dL   Calcium 8.3 (L) 8.9 - 10.3 mg/dL   Total Protein 6.9 6.5 - 8.1 g/dL   Albumin 3.9 3.5 - 5.0 g/dL   AST 28 15 - 41 U/L   ALT 17 0 - 44 U/L   Alkaline Phosphatase 45 38 - 126 U/L   Total Bilirubin 1.2 0.3 - 1.2 mg/dL   GFR calc non Af Amer >60 >60 mL/min   GFR calc Af Amer >60 >60 mL/min   Anion gap 13 5 - 15    Comment: Performed at [facility], [address] [place], [place] [address]  Ethanol     Status: None   Collection Time: 09/08/19  1:43 PM  Result Value Ref Range   Alcohol, Ethyl (B) <10 <10 mg/dL     Comment: (NOTE) Lowest detectable limit for  serum alcohol is 10 mg/dL. For medical purposes only. Performed at [facility], [address] [place], [place] [id number]   Salicylate level     Status: None   Collection Time: 09/08/19  1:43 PM  Result Value Ref Range   Salicylate Lvl [id number] 2.8 - 30.0 mg/dL    Comment: Performed at [facility], [address] [place], [place] [address]  Acetaminophen level     Status: Abnormal   Collection Time: 09/08/19  1:43 PM  Result Value Ref Range   Acetaminophen (Tylenol), Serum <10 (L) 10 - 30 ug/mL    Comment: (NOTE) Therapeutic concentrations vary significantly. A range of 10-30 ug/mL  may be an effective concentration for many patients. However, some  are best treated at concentrations outside of this range. Acetaminophen concentrations >150 ug/mL at 4 hours after ingestion  and >50 ug/mL at 12 hours after ingestion are often associated with  toxic reactions. Performed at [facility], [address] [place], [place] [address]   Magnesium     Status: None   Collection Time: 09/08/19  1:43 PM  Result Value Ref Range   Magnesium 1.8 1.7 - 2.4 mg/dL    Comment: Performed at [facility], [address] [place], [place] [address]  Urine Drug Screen, Qualitative ([place] only)     Status: Abnormal   Collection Time: 09/08/19  3:08 PM  Result Value Ref Range   Tricyclic, Ur Screen NONE DETECTED NONE DETECTED   Amphetamines, Ur Screen NONE DETECTED NONE DETECTED   MDMA (Ecstasy)Ur Screen NONE DETECTED NONE DETECTED   Cocaine Metabolite,Ur [place] NONE DETECTED NONE DETECTED   Opiate, Ur Screen NONE DETECTED NONE DETECTED   Phencyclidine (PCP) Ur S NONE DETECTED NONE DETECTED   Cannabinoid 50 Ng, Ur  NONE DETECTED NONE DETECTED   Barbiturates, Ur Screen NONE DETECTED NONE DETECTED   Benzodiazepine, Ur Scrn POSITIVE (A) NONE DETECTED   Methadone Scn, Ur NONE DETECTED NONE DETECTED    Comment:  (NOTE) Tricyclics + metabolites, urine    Cutoff 1000 ng/mL Amphetamines + metabolites, urine  Cutoff 1000 ng/mL MDMA (Ecstasy), urine              Cutoff 500 ng/mL Cocaine Metabolite, urine          Cutoff 300 ng/mL Opiate + metabolites, urine        Cutoff 300 ng/mL Phencyclidine (PCP), urine         Cutoff 25 ng/mL Cannabinoid, urine                 Cutoff 50 ng/mL Barbiturates + metabolites, urine  Cutoff 200 ng/mL Benzodiazepine, urine              Cutoff 200 ng/mL Methadone, urine                   Cutoff 300 ng/mL The urine drug screen provides only a preliminary, unconfirmed analytical test result and should not be used for non-medical purposes. Clinical consideration and professional judgment should be applied to any positive drug screen result due to possible interfering substances. A more specific alternate chemical method must be used in order to obtain a confirmed analytical result. Gas chromatography / mass spectrometry (GC/MS) is the preferred confirmat ory method. Performed at [facility], [address] [place], [place] [address]     Current Facility-Administered Medications  Medication Dose Route Frequency Provider Last Rate Last Dose  . chlordiazePOXIDE (LIBRIUM) capsule 25 mg  25 mg Oral QID Harvest Dark, MD   25 mg at 09/08/19 2139  . Ipratropium-Albuterol (COMBIVENT) respimat 1 puff  1 puff Inhalation Q6H PRN Tyler Pita, MD      . LORazepam (ATIVAN) injection 0-4 mg  0-4 mg Intravenous Q6H Merlyn Lot, MD   Stopped at 09/08/19 2129   Or  . LORazepam (ATIVAN) tablet 0-4 mg  0-4 mg Oral Q6H Merlyn Lot, MD      . Derrill Memo ON 09/10/2019] LORazepam (ATIVAN) injection 0-4 mg  0-4 mg Intravenous Q12H Merlyn Lot, MD       Or  . Derrill Memo ON 09/10/2019] LORazepam (ATIVAN) tablet 0-4 mg  0-4 mg Oral Q12H Merlyn Lot, MD      . thiamine (VITAMIN B-1) tablet 100 mg  100 mg Oral Daily Merlyn Lot, MD       Or  .  thiamine (B-1) injection 100 mg  100 mg Intravenous Daily Merlyn Lot, MD   100 mg at 09/08/19 1527   Current Outpatient Medications  Medication Sig Dispense Refill  . albuterol (VENTOLIN HFA) 108 (90 Base) MCG/ACT inhaler Inhale 2 puffs into the lungs every 4 (four) hours as needed for wheezing or shortness of breath. Or 30 minutes before exercise 18 g 11  . busPIRone (BUSPAR) 30 MG tablet Take 1 tablet (30 mg total) by mouth 2 (two) times daily. 180 tablet 1  . clomiPHENE (CLOMID) 50 MG tablet Take by mouth.    . escitalopram (LEXAPRO) 10 MG tablet Take 1 tablet (10 mg total) by mouth daily. 90 tablet 1  . lamoTRIgine (LAMICTAL) 200 MG tablet Take 1 tablet (200 mg total) by mouth daily. 90 tablet 1  . montelukast (SINGULAIR) 10 MG tablet Take 1 tablet (10 mg total) by mouth daily. 90 tablet 3  . sildenafil (REVATIO) 20 MG tablet Take 2 tablets (40 mg total) by mouth daily. Prn 60 tablet 11  . SUMAtriptan (IMITREX) 100 MG tablet May repeat in 2 hours if not helping. Max dose 200 mg in 1 day. Do not take more than 2x per week 9 tablet 11  . zolpidem (AMBIEN) 10 MG tablet Take 1 tablet (10 mg total) by mouth at bedtime as needed for sleep. 90 tablet 0    Musculoskeletal: Strength & Muscle Tone: decreased Gait & Station: unsteady Patient leans: Backward  Psychiatric Specialty Exam: Physical Exam  Nursing note and vitals reviewed. Constitutional: He is oriented to person, place, and time. He appears well-developed.  Neck: Normal range of motion. Neck supple.  Cardiovascular: Normal rate.  Respiratory: Effort normal.  Musculoskeletal: Normal range of motion.  Neurological: He is alert and oriented to person, place, and time.    Review of Systems  Psychiatric/Behavioral: Positive for depression, substance abuse and suicidal ideas. The patient is nervous/anxious.   All other systems reviewed and are negative.   Blood pressure 125/75, pulse 91, temperature 98 F (36.7 C),  temperature source Oral, resp. rate 19, height 5\' 11"  (1.803 m), weight 95.3 kg, SpO2 97 %.Body mass index is 29.29 kg/m.  General Appearance: Disheveled  Eye Contact:  Fair  Speech:  Clear and Coherent and Slow  Volume:  Decreased  Mood:  Depressed and Hopeless  Affect:  Congruent  Thought Process:  Coherent  Orientation:  Full (Time, Place, and Person)  Thought Content:  Logical  Suicidal Thoughts:  No  Homicidal Thoughts:  No  Memory:  Immediate;   Good Recent;   Good Remote;   Good  Judgement:  Impaired  Insight:  Lacking  Psychomotor Activity:  Normal  Concentration:  Concentration: Good and Attention Span: Good  Recall:  [place] of Knowledge:  Good  Language:  Good  Akathisia:  Negative  Handed:  Right  AIMS (if indicated):     Assets:  Communication Skills Desire for Improvement Resilience Social Support  ADL's:  Intact  Cognition:  WNL  Sleep:   Good     Treatment Plan Summary: Medication management and Plan Patient meets criteria for psychiatric inpatient admission.  Disposition: Recommend psychiatric Inpatient admission when medically cleared. Supportive therapy provided about ongoing stressors.  Caroline Sauger, NP 09/08/2019 11:17 PM

## 2019-09-08 NOTE — ED Notes (Signed)
BEHAVIORAL HEALTH ROUNDING Patient sleeping: No. Patient alert and oriented: yes Behavior appropriate: Yes.  ; If no, describe:  Nutrition and fluids offered: yes Toileting and hygiene offered: Yes  Sitter present: q15 minute observations  Law enforcement present: Yes [employer]  

## 2019-09-08 NOTE — ED Provider Notes (Signed)
[facility] Emergency Department Provider Note  Time seen: 1:43 PM  I have reviewed the triage vital signs and the nursing notes.   HISTORY  Chief Complaint Alcohol Intoxication and Withdrawal   HPI Johnny Villa is a 43 y.o. male with a past medical history of bipolar, substance abuse, alcohol abuse, presents to the emergency department for withdrawal symptoms and suicide attempt.  According to the patient he has been recently sober but relapsed and has been drinking heavily, last drink was early this morning.  Patient states he has been depressed because of this and took a handful of Lamictal this morning to kill himself.  Denies any medical complaints such as fever cough vomiting.  Past Medical History:  Diagnosis Date  . Bipolar disorder [facility])    psychiatrist in [place]  . Blood in stool   . Depression   . Drug abuse in remission [facility])    sober for 6 years  . Fatty liver 01/2018  . H/O alcohol abuse   . Headache    migraines  . Hypertension   . Low testosterone in male     Patient Active Problem List   Diagnosis Date Noted  . Exercise-induced asthma 07/16/2019  . Insomnia 07/09/2019  . Alcohol use disorder, moderate, dependence ([employer]) 07/09/2019  . Cocaine use disorder, moderate, in sustained remission ([place]) 06/17/2019  . Bipolar affective disorder, depressed, severe ([place]) 05/31/2019  . Intentional drug overdose ([place])   . PTSD (post-traumatic stress disorder) 05/04/2019  . Alcohol use disorder, moderate, in early remission ([place]) 05/04/2019  . Migraine 08/01/2018  . Chronic migraine without aura without status migrainosus, not intractable 07/15/2018  . Tremor 07/15/2018  . Hyperlipidemia 06/20/2018  . Cervical spondylosis without myelopathy (C4,5,6,7) 01/08/2018  . Cervical facet joint syndrome 01/08/2018  . DDD (degenerative disc disease), cervical 01/08/2018  . Cervicalgia 11/28/2017  . Chronic right shoulder pain 11/28/2017  .  Hypogonadism in male 10/29/2017  . Low testosterone 10/29/2017  . Erectile dysfunction 10/29/2017  . Bipolar I disorder, most recent episode depressed ([place]) 10/29/2017    Past Surgical History:  Procedure Laterality Date  . APPENDECTOMY     2006  . COLONOSCOPY WITH PROPOFOL N/A 12/30/2017   Procedure: COLONOSCOPY WITH PROPOFOL;  Surgeon: Lin Landsman, MD;  Location: [facility] ENDOSCOPY;  Service: Gastroenterology;  Laterality: N/A;  . ESOPHAGOGASTRODUODENOSCOPY (EGD) WITH PROPOFOL N/A 12/30/2017   Procedure: ESOPHAGOGASTRODUODENOSCOPY (EGD) WITH PROPOFOL;  Surgeon: Lin Landsman, MD;  Location: [facility] ENDOSCOPY;  Service: Gastroenterology;  Laterality: N/A;    Prior to Admission medications   Medication Sig Start Date End Date Taking? Authorizing Provider  albuterol (VENTOLIN HFA) 108 (90 Base) MCG/ACT inhaler Inhale 2 puffs into the lungs every 4 (four) hours as needed for wheezing or shortness of breath. Or 30 minutes before exercise 07/16/19   McLean-Scocuzza, Nino Glow, MD  busPIRone (BUSPAR) 30 MG tablet Take 1 tablet (30 mg total) by mouth 2 (two) times daily. 08/20/19   Ursula Alert, MD  clomiPHENE (CLOMID) 50 MG tablet Take by mouth. 06/04/19   [provider]  escitalopram (LEXAPRO) 10 MG tablet Take 1 tablet (10 mg total) by mouth daily. 08/20/19   Ursula Alert, MD  lamoTRIgine (LAMICTAL) 200 MG tablet Take 1 tablet (200 mg total) by mouth daily. 08/20/19   Ursula Alert, MD  montelukast (SINGULAIR) 10 MG tablet Take 1 tablet (10 mg total) by mouth daily. 08/21/19 08/20/20  Tyler Pita, MD  ondansetron (ZOFRAN-ODT) 4 MG disintegrating tablet Take  1 tablet (4 mg total) by mouth every 6 (six) hours as needed for nausea or vomiting. 06/03/19   Clapacs, Madie Reno, MD  sildenafil (REVATIO) 20 MG tablet Take 2 tablets (40 mg total) by mouth daily. Prn 08/11/19   McLean-Scocuzza, Nino Glow, MD  SUMAtriptan (IMITREX) 100 MG tablet May repeat in 2 hours if not helping. Max  dose 200 mg in 1 day. Do not take more than 2x per week 08/04/19   McLean-Scocuzza, Nino Glow, MD  zolpidem (AMBIEN) 10 MG tablet Take 1 tablet (10 mg total) by mouth at bedtime as needed for sleep. 07/09/19   Ursula Alert, MD    No Known Allergies  Family History  Problem Relation Age of Onset  . Depression Mother   . Alcohol abuse Father   . Cancer Father        prostate dx'ed 32  . COPD Father   . Hyperlipidemia Father   . Hypertension Father     Social History Social History   Tobacco Use  . Smoking status: Former Smoker    Packs/day: 0.25    Years: 2.00    Pack years: 0.50    Types: Cigarettes    Quit date: 1999    Years since quitting: 21.9  . Smokeless tobacco: Former Systems developer    Types: Chew  Substance Use Topics  . Alcohol use: Not Currently    Frequency: Never    Comment: former quit 03/14/2012  . Drug use: Not Currently    Comment: former quit cocaine 03/14/2012     Review of Systems Constitutional: Negative for fever. Cardiovascular: Negative for chest pain. Respiratory: Negative for shortness of breath.  Gastrointestinal: Negative for abdominal pain, vomiting  Musculoskeletal: Negative for musculoskeletal complaints Neurological: Negative for headache All other ROS negative  ____________________________________________   PHYSICAL EXAM:  VITAL SIGNS: ED Triage Vitals  Enc Vitals Group     BP 09/08/19 1340 (!) 142/87     Pulse Rate 09/08/19 1340 69     Resp 09/08/19 1340 18     Temp 09/08/19 1340 97.8 F (36.6 C)     Temp Source 09/08/19 1340 Oral     SpO2 09/08/19 1340 97 %     Weight 09/08/19 1341 210 lb (95.3 kg)     Height 09/08/19 1341 5\' 11"  (1.803 m)     Head Circumference --      Peak Flow --      Pain Score 09/08/19 1341 0     Pain Loc --      Pain Edu? --      Excl. in [place]? --    Constitutional: Alert and oriented.  Mildly tremulous.  No acute distress. Eyes: Normal exam ENT      Head: Normocephalic and atraumatic.       Mouth/Throat: Mucous membranes are moist. Cardiovascular: Normal rate, regular rhythm. Respiratory: Normal respiratory effort without tachypnea nor retractions. Breath sounds are clear  Gastrointestinal: Soft and nontender. No distention. Musculoskeletal: Nontender with normal range of motion in all extremities.  Neurologic:  Normal speech and language. No gross focal neurologic deficits Skin:  Skin is warm, dry and intact.  Psychiatric: Mood and affect are normal.   ____________________________________________    EKG  EKG viewed and interpreted by myself shows a normal sinus rhythm at 69 bpm with a narrow QRS, normal axis, slight QTC prolongation otherwise normal intervals with no concerning ST changes.  ____________________________________________   INITIAL IMPRESSION / ASSESSMENT AND PLAN / ED COURSE  Pertinent labs & imaging results that were available during my care of the patient were reviewed by me and considered in my medical decision making (see chart for details).   Patient presents to the emergency department for alcohol use and withdrawal symptoms along with suicide attempt with overdose this morning.  Patient states heavy daily alcohol use last drink was this morning presents with EMS for withdrawal-like symptoms.  EMS gave the patient 2 mg of IV Versed in route to the hospital.  Upon arrival here patient states he also took a handful of Lamictal this morning trying to kill himself because he has been quite depressed.  Patient is mildly tremulous at this time.  We will check labs including ethanol level.  We will place the patient under an IVC to have psychiatry/TTS evaluate.  We will have the nurse contact poison control and will keep on cardiac monitoring.  Current lab work shows a negative alcohol level, largely normal labs overall.  Reassuring EKG.  Discussed with poison control who recommend 6-hour monitoring.  We will continue to monitor on cardiac monitoring.  Patient is  now under IVC awaiting TTS and psychiatric evaluation.  Johnny Villa was evaluated in Emergency Department on 09/08/2019 for the symptoms described in the history of present illness. He was evaluated in the context of the global COVID-19 pandemic, which necessitated consideration that the patient might be at risk for infection with the SARS-CoV-2 virus that causes COVID-19. Institutional protocols and algorithms that pertain to the evaluation of patients at risk for COVID-19 are in a state of rapid change based on information released by regulatory bodies including the CDC and federal and state organizations. These policies and algorithms were followed during the patient's care in the ED.  ____________________________________________   FINAL CLINICAL IMPRESSION(S) / ED DIAGNOSES  Suicide attempt Overdose Alcohol abuse   Harvest Dark, MD 09/08/19 1458

## 2019-09-08 NOTE — BH Assessment (Signed)
Assessment Note  Johnny Villa is an 43 y.o. male presenting to ER due to overdosing on Lamictal (approx 10 pills) and Vivitrol. Triage reports that patient was withdrawing from alcohol, although patient UDS was negative for alcohol. TTS attempted to assess but patient was unable due to not being oriented and was not able to verbally engage. Pending medical clearance.     Diagnosis: Bipolar Disorder, by history  Past Medical History:  Past Medical History:  Diagnosis Date  . Bipolar disorder [facility])    psychiatrist in [place]  . Blood in stool   . Depression   . Drug abuse in remission [facility])    sober for 6 years  . Fatty liver 01/2018  . H/O alcohol abuse   . Headache    migraines  . Hypertension   . Low testosterone in male     Past Surgical History:  Procedure Laterality Date  . APPENDECTOMY     2006  . COLONOSCOPY WITH PROPOFOL N/A 12/30/2017   Procedure: COLONOSCOPY WITH PROPOFOL;  Surgeon: Lin Landsman, MD;  Location: [facility] ENDOSCOPY;  Service: Gastroenterology;  Laterality: N/A;  . ESOPHAGOGASTRODUODENOSCOPY (EGD) WITH PROPOFOL N/A 12/30/2017   Procedure: ESOPHAGOGASTRODUODENOSCOPY (EGD) WITH PROPOFOL;  Surgeon: Lin Landsman, MD;  Location: [facility] ENDOSCOPY;  Service: Gastroenterology;  Laterality: N/A;    Family History:  Family History  Problem Relation Age of Onset  . Depression Mother   . Alcohol abuse Father   . Cancer Father        prostate dx'ed 43  . COPD Father   . Hyperlipidemia Father   . Hypertension Father     Social History:  reports that he quit smoking about 21 years ago. His smoking use included cigarettes. He has a 0.50 pack-year smoking history. He has quit using smokeless tobacco.  His smokeless tobacco use included chew. He reports previous alcohol use. He reports previous drug use.  Additional Social History:     CIWA: CIWA-Ar BP: 136/83 Pulse Rate: 67 Nausea and Vomiting: mild nausea with no vomiting Tactile Disturbances:  none Tremor: five Auditory Disturbances: not present Paroxysmal Sweats: barely perceptible sweating, palms moist Visual Disturbances: very mild sensitivity Anxiety: mildly anxious Headache, Fullness in Head: mild Agitation: somewhat more than normal activity Orientation and Clouding of Sensorium: oriented and can do serial additions CIWA-Ar Total: 12 COWS:    Allergies: No Known Allergies  Home Medications: (Not in a hospital admission)   OB/GYN Status:  No LMP for male patient.  General Assessment Data Assessment unable to be completed: Yes Reason for not completing assessment: Patient not oriented, unable to speak clear sentences Admission Status: Involuntary                    Mental Status Report Motor Activity: Freedom of movement                            Advance Directives (For Healthcare) Does Patient Have a Medical Advance Directive?: No Would patient like information on creating a medical advance directive?: No - Patient declined          Disposition: Observe and Reassess; pending medical clearance    On Site Evaluation by:   Reviewed with Physician:    Leonie Douglas LCAS-A 09/08/2019 5:30 PM

## 2019-09-08 NOTE — BH Assessment (Signed)
Assessment Note  Johnny Villa is an 43 y.o. male who presents to the ER due to an intentional overdose to end his life. Patient states he was sober for approximately six years and relapse approximately six to seven months ago. Since the relapse he has struggled to remain sober and his addiction has caused him to become depressed; hopeless, helpless and worthless. Patient states he has had a suicide attempt approximately twenty years ago and it was overdose on his medications.  During the interview, the patient was calm, cooperative and pleasant. He was able to provide appropriate answers to the questions. He admits to the use of alcohol and chronic relapse. Patient denies history of violence and aggression. He also denies involvement with the legal system.  Diagnosis: Bipolar  Past Medical History:  Past Medical History:  Diagnosis Date  . Bipolar disorder [facility])    psychiatrist in [place]  . Blood in stool   . Depression   . Drug abuse in remission [facility])    sober for 6 years  . Fatty liver 01/2018  . H/O alcohol abuse   . Headache    migraines  . Hypertension   . Low testosterone in male     Past Surgical History:  Procedure Laterality Date  . APPENDECTOMY     2006  . COLONOSCOPY WITH PROPOFOL N/A 12/30/2017   Procedure: COLONOSCOPY WITH PROPOFOL;  Surgeon: Lin Landsman, MD;  Location: [facility] ENDOSCOPY;  Service: Gastroenterology;  Laterality: N/A;  . ESOPHAGOGASTRODUODENOSCOPY (EGD) WITH PROPOFOL N/A 12/30/2017   Procedure: ESOPHAGOGASTRODUODENOSCOPY (EGD) WITH PROPOFOL;  Surgeon: Lin Landsman, MD;  Location: [facility] ENDOSCOPY;  Service: Gastroenterology;  Laterality: N/A;    Family History:  Family History  Problem Relation Age of Onset  . Depression Mother   . Alcohol abuse Father   . Cancer Father        prostate dx'ed 54  . COPD Father   . Hyperlipidemia Father   . Hypertension Father     Social History:  reports that he quit smoking about 21 years ago. His  smoking use included cigarettes. He has a 0.50 pack-year smoking history. He has quit using smokeless tobacco.  His smokeless tobacco use included chew. He reports previous alcohol use. He reports previous drug use.  Additional Social History:  Alcohol / Drug Use Pain Medications: See PTA Prescriptions: See PTA Over the Counter: See PTA History of alcohol / drug use?: Yes Longest period of sobriety (when/how long): Six years Negative Consequences of Use: Personal relationships, Work / School Substance #1 Name of Substance 1: Alcohol 1 - Last Use / Amount: 09/08/2019  CIWA: CIWA-Ar BP: 125/75 Pulse Rate: 91 Nausea and Vomiting: mild nausea with no vomiting Tactile Disturbances: none Tremor: five Auditory Disturbances: not present Paroxysmal Sweats: barely perceptible sweating, palms moist Visual Disturbances: very mild sensitivity Anxiety: mildly anxious Headache, Fullness in Head: mild Agitation: somewhat more than normal activity Orientation and Clouding of Sensorium: oriented and can do serial additions CIWA-Ar Total: 12 COWS:    Allergies: No Known Allergies  Home Medications: (Not in a hospital admission)   OB/GYN Status:  No LMP for male patient.  General Assessment Data Assessment unable to be completed: Yes Reason for not completing assessment: Patient not oriented, unable to speak clear sentences Location of Assessment: [facility] ED TTS Assessment: In system Is this a Tele or Face-to-Face Assessment?: Face-to-Face Is this an Initial Assessment or a Re-assessment for this encounter?: Initial Assessment Patient Accompanied by:: N/A Language Other than  English: No Living Arrangements: Other (Comment)(Private Home) What gender do you identify as?: Male Marital status: Single Pregnancy Status: No Living Arrangements: Alone Can pt return to current living arrangement?: Yes Admission Status: Involuntary Petitioner: ED Attending Is patient capable of signing voluntary  admission?: No(Under IVC) Referral Source: Self/Family/Friend Insurance type: [employer] Medicare  Medical Screening Exam ([place]) Medical Exam completed: Yes  Crisis Care Plan Living Arrangements: Alone Legal Guardian: Other:(Self) Name of Psychiatrist: Dr. Eappen([facility]) Name of Therapist: Reports of none  Education Status Is patient currently in school?: No Is the patient employed, unemployed or receiving disability?: Unemployed, Receiving disability income  Risk to self with the past 6 months Suicidal Ideation: Yes-Currently Present Has patient been a risk to self within the past 6 months prior to admission? : Yes Suicidal Intent: Yes-Currently Present Has patient had any suicidal intent within the past 6 months prior to admission? : Yes Is patient at risk for suicide?: Yes Suicidal Plan?: Yes-Currently Present Has patient had any suicidal plan within the past 6 months prior to admission? : Yes Specify Current Suicidal Plan: Overdose on medications Access to Means: Yes Specify Access to Suicidal Means: Have medications What has been your use of drugs/alcohol within the last 12 months?: Alcohol Previous Attempts/Gestures: Yes How many times?: 1 Other Self Harm Risks: Active alcohol abuse Triggers for Past Attempts: Other (Comment)(Chronic relapse) Intentional Self Injurious Behavior: None Family Suicide History: Unknown Recent stressful life event(s): Other (Comment)(Chronic relapse ) Persecutory voices/beliefs?: No Depression: Yes Depression Symptoms: Tearfulness, Isolating, Fatigue, Guilt, Loss of interest in usual pleasures, Feeling worthless/self pity Substance abuse history and/or treatment for substance abuse?: Yes Suicide prevention information given to non-admitted patients: Not applicable  Risk to Others within the past 6 months Homicidal Ideation: No Does patient have any lifetime risk of violence toward others beyond the six  months prior to admission? : No Thoughts of Harm to Others: No Current Homicidal Intent: No Current Homicidal Plan: No Access to Homicidal Means: No Identified Victim: Reports of none History of harm to others?: No Assessment of Violence: None Noted Violent Behavior Description: Reports of none Does patient have access to weapons?: No Criminal Charges Pending?: No Does patient have a court date: No Is patient on probation?: No  Psychosis Hallucinations: None noted Delusions: None noted  Mental Status Report Appearance/Hygiene: Unremarkable, In scrubs Eye Contact: Good Motor Activity: Freedom of movement, Unremarkable Speech: Logical/coherent, Unremarkable Level of Consciousness: Alert Mood: Anxious, Depressed, Sad, Pleasant Affect: Anxious, Depressed, Sad Anxiety Level: Minimal Thought Processes: Coherent, Relevant Judgement: Partial Orientation: Person, Place, Time, Situation, Appropriate for developmental age Obsessive Compulsive Thoughts/Behaviors: Minimal  Cognitive Functioning Concentration: Normal Memory: Recent Intact, Remote Intact Is patient IDD: No Insight: Fair Impulse Control: Fair Appetite: Good Have you had any weight changes? : No Change Sleep: No Change Total Hours of Sleep: 8 Vegetative Symptoms: None  ADLScreening [facility] Assessment Services) Patient's cognitive ability adequate to safely complete daily activities?: Yes Patient able to express need for assistance with ADLs?: Yes Independently performs ADLs?: Yes (appropriate for developmental age)  Prior Inpatient Therapy Prior Inpatient Therapy: Yes Prior Therapy Dates: 05/2019 Prior Therapy Facilty/Provider(s): [facility] Reason for Treatment: Bipolar  Prior Outpatient Therapy Prior Outpatient Therapy: Yes Prior Therapy Dates: Current Prior Therapy Facilty/Provider(s): [facility](Dr. Eappen) Reason for Treatment: Bipolar Does patient have an ACCT team?: No Does patient  have Intensive In-House Services?  : No Does patient have [facility] services? : No Does patient have P4CC services?: No  ADL Screening (condition at time of admission) Patient's cognitive ability adequate to safely complete daily activities?: Yes Is the patient deaf or have difficulty hearing?: No Does the patient have difficulty seeing, even when wearing glasses/contacts?: No Does the patient have difficulty concentrating, remembering, or making decisions?: No Patient able to express need for assistance with ADLs?: Yes Does the patient have difficulty dressing or bathing?: No Independently performs ADLs?: Yes (appropriate for developmental age) Does the patient have difficulty walking or climbing stairs?: No Weakness of Legs: None Weakness of Arms/Hands: None  Home Assistive Devices/Equipment Home Assistive Devices/Equipment: None  Therapy Consults (therapy consults require a physician order) PT Evaluation Needed: No OT Evalulation Needed: No SLP Evaluation Needed: No Abuse/Neglect Assessment (Assessment to be complete while patient is alone) Abuse/Neglect Assessment Can Be Completed: Yes Physical Abuse: Denies Verbal Abuse: Denies Sexual Abuse: Denies Exploitation of patient/patient's resources: Denies Self-Neglect: Denies Values / Beliefs Cultural Requests During Hospitalization: None Spiritual Requests During Hospitalization: None Consults Spiritual Care Consult Needed: No Social Work Consult Needed: No Regulatory affairs officer (For Healthcare) Does Patient Have a Medical Advance Directive?: No Would patient like information on creating a medical advance directive?: No - Patient declined       Child/Adolescent Assessment Running Away Risk: Denies(Patient is an adult)  Disposition:  Disposition Initial Assessment Completed for this Encounter: Yes  On Site Evaluation by:   Reviewed with Physician:    Gunnar Fusi MS, LCAS, [facility], [facility] Therapeutic Triage  Specialist 09/08/2019 10:15 PM

## 2019-09-08 NOTE — ED Notes (Signed)
Patient was ambulated to the restroom. Patient unable to walk and was placed in wheelchair and assisted to restroom. Patient was dressed out in burgundy scrubs. Repeat EKG preformed. Patient denies SI at this time. Patient states he was feeling suicidal for a few days and took OD of medications. Patient also denies HI/AVH. Patient contracts for safety with this Probation officer.

## 2019-09-08 NOTE — ED Triage Notes (Signed)
He arrives today via [facility] from home with reports of withdrawal from alcohol  His last drink was sometime yesterday   Pt verbalizes to the MD that he also took an OD of Lamictal  - approx 10 pills and he took another med unknown name or amount   Pt lives with his girlfriend and she reported to EMS that he has been in his bedroom since Friday and has not come out    EMS reports DT's present   2mg  Versed and 4 mg Zofran administered PTA

## 2019-09-08 NOTE — ED Notes (Addendum)
Poison control notified by me  Treatment recommendations Cardiac monitor 4-6 hours or until he is at baseline  Check a magnesium and K level    No geodon or haldol for withdrawal symptoms  - benzo use is okay   Repeat ECG in 4-6 hours   If QRS duration has increased to >120 then contact PC again for more orders

## 2019-09-09 ENCOUNTER — Other Ambulatory Visit: Payer: Self-pay

## 2019-09-09 ENCOUNTER — Inpatient Hospital Stay
Admit: 2019-09-09 | Discharge: 2019-09-13 | DRG: 885 | Disposition: A | Discharge status: Home / Self Care | Payer: Medicare PPO | Source: Ambulatory Visit | Attending: Psychiatry | Admitting: Psychiatry

## 2019-09-09 DIAGNOSIS — F3130 Bipolar disorder, current episode depressed, mild or moderate severity, unspecified: Secondary | ICD-10-CM | POA: Diagnosis not present

## 2019-09-09 DIAGNOSIS — F10939 Alcohol use, unspecified with withdrawal, unspecified: Secondary | ICD-10-CM

## 2019-09-09 DIAGNOSIS — T426X2A Poisoning by other antiepileptic and sedative-hypnotic drugs, intentional self-harm, initial encounter: Secondary | ICD-10-CM | POA: Diagnosis present

## 2019-09-09 DIAGNOSIS — E785 Hyperlipidemia, unspecified: Secondary | ICD-10-CM | POA: Diagnosis not present

## 2019-09-09 DIAGNOSIS — Z87891 Personal history of nicotine dependence: Secondary | ICD-10-CM

## 2019-09-09 DIAGNOSIS — Z20828 Contact with and (suspected) exposure to other viral communicable diseases: Secondary | ICD-10-CM | POA: Diagnosis not present

## 2019-09-09 DIAGNOSIS — F4310 Post-traumatic stress disorder, unspecified: Secondary | ICD-10-CM | POA: Diagnosis not present

## 2019-09-09 DIAGNOSIS — J45909 Unspecified asthma, uncomplicated: Secondary | ICD-10-CM | POA: Diagnosis not present

## 2019-09-09 DIAGNOSIS — F1020 Alcohol dependence, uncomplicated: Secondary | ICD-10-CM | POA: Diagnosis present

## 2019-09-09 DIAGNOSIS — F10239 Alcohol dependence with withdrawal, unspecified: Secondary | ICD-10-CM | POA: Diagnosis not present

## 2019-09-09 DIAGNOSIS — F319 Bipolar disorder, unspecified: Secondary | ICD-10-CM | POA: Diagnosis not present

## 2019-09-09 DIAGNOSIS — R739 Hyperglycemia, unspecified: Secondary | ICD-10-CM | POA: Diagnosis present

## 2019-09-09 DIAGNOSIS — T50992A Poisoning by other drugs, medicaments and biological substances, intentional self-harm, initial encounter: Secondary | ICD-10-CM | POA: Diagnosis not present

## 2019-09-09 DIAGNOSIS — Z79899 Other long term (current) drug therapy: Secondary | ICD-10-CM | POA: Diagnosis not present

## 2019-09-09 DIAGNOSIS — T1491XA Suicide attempt, initial encounter: Secondary | ICD-10-CM

## 2019-09-09 DIAGNOSIS — J45990 Exercise induced bronchospasm: Secondary | ICD-10-CM

## 2019-09-09 DIAGNOSIS — F10130 Alcohol abuse with withdrawal, uncomplicated: Secondary | ICD-10-CM | POA: Diagnosis not present

## 2019-09-09 DIAGNOSIS — G4700 Insomnia, unspecified: Secondary | ICD-10-CM | POA: Diagnosis present

## 2019-09-09 DIAGNOSIS — T486X2A Poisoning by antiasthmatics, intentional self-harm, initial encounter: Secondary | ICD-10-CM | POA: Diagnosis not present

## 2019-09-09 DIAGNOSIS — T43222A Poisoning by selective serotonin reuptake inhibitors, intentional self-harm, initial encounter: Secondary | ICD-10-CM | POA: Diagnosis not present

## 2019-09-09 DIAGNOSIS — I10 Essential (primary) hypertension: Secondary | ICD-10-CM | POA: Diagnosis not present

## 2019-09-09 LAB — RESPIRATORY PANEL BY RT PCR (FLU A&B, COVID)
Influenza A by PCR: NEGATIVE
Influenza B by PCR: NEGATIVE
SARS Coronavirus 2 by RT PCR: NEGATIVE

## 2019-09-09 MED ORDER — ZOLPIDEM TARTRATE 5 MG PO TABS
10.0000 mg | ORAL_TABLET | Freq: Every evening | ORAL | Status: DC | PRN
  Administered 2019-09-09 – 2019-09-12 (×4): 10 mg via ORAL
  Filled 2019-09-09 (×4): qty 2

## 2019-09-09 MED ORDER — MONTELUKAST SODIUM 10 MG PO TABS
10.0000 mg | ORAL_TABLET | Freq: Every day | ORAL | Status: DC
  Administered 2019-09-13: 10 mg via ORAL
  Filled 2019-09-09 (×3): qty 1

## 2019-09-09 MED ORDER — CLOMIPHENE CITRATE 50 MG PO TABS
25.0000 mg | ORAL_TABLET | ORAL | Status: DC
  Administered 2019-09-09 – 2019-09-11 (×2): 25 mg via ORAL
  Filled 2019-09-09 (×2): qty 1

## 2019-09-09 MED ORDER — ALUM & MAG HYDROXIDE-SIMETH 200-200-20 MG/5ML PO SUSP: 30.0000 mL | ORAL | Status: DC | PRN

## 2019-09-09 MED ORDER — ALBUTEROL SULFATE (2.5 MG/3ML) 0.083% IN NEBU: 2.5000 mg | INHALATION_SOLUTION | RESPIRATORY_TRACT | Status: DC | PRN

## 2019-09-09 MED ORDER — ACETAMINOPHEN 325 MG PO TABS: 650.0000 mg | ORAL_TABLET | Freq: Four times a day (QID) | ORAL | Status: DC | PRN

## 2019-09-09 MED ORDER — ESCITALOPRAM OXALATE 10 MG PO TABS
10.0000 mg | ORAL_TABLET | Freq: Every day | ORAL | Status: DC
  Administered 2019-09-10 – 2019-09-13 (×4): 10 mg via ORAL
  Filled 2019-09-09 (×4): qty 1

## 2019-09-09 MED ORDER — IPRATROPIUM-ALBUTEROL 0.5-2.5 (3) MG/3ML IN SOLN
3.0000 mL | Freq: Four times a day (QID) | RESPIRATORY_TRACT | Status: DC | PRN
  Filled 2019-09-09: qty 3

## 2019-09-09 MED ORDER — MAGNESIUM HYDROXIDE 400 MG/5ML PO SUSP: 30.0000 mL | Freq: Every day | ORAL | Status: DC | PRN

## 2019-09-09 MED ORDER — BUSPIRONE HCL 5 MG PO TABS
30.0000 mg | ORAL_TABLET | Freq: Two times a day (BID) | ORAL | Status: DC
  Administered 2019-09-09 – 2019-09-13 (×8): 30 mg via ORAL
  Filled 2019-09-09 (×7): qty 6

## 2019-09-09 MED ORDER — CHLORDIAZEPOXIDE HCL 25 MG PO CAPS
25.0000 mg | ORAL_CAPSULE | Freq: Four times a day (QID) | ORAL | Status: DC
  Administered 2019-09-09 – 2019-09-10 (×3): 25 mg via ORAL
  Filled 2019-09-09 (×3): qty 1

## 2019-09-09 MED ORDER — SUMATRIPTAN SUCCINATE 50 MG PO TABS: 100.0000 mg | ORAL_TABLET | ORAL | Status: DC | PRN

## 2019-09-09 MED ORDER — LAMOTRIGINE 100 MG PO TABS
200.0000 mg | ORAL_TABLET | Freq: Every day | ORAL | Status: DC
  Administered 2019-09-10 – 2019-09-13 (×5): 200 mg via ORAL
  Filled 2019-09-09 (×4): qty 2

## 2019-09-09 NOTE — ED Notes (Signed)
Patient ambulatory and able to walk to shower on his own, no assistance needed

## 2019-09-09 NOTE — ED Notes (Signed)
Patient was awake and this Probation officer assisted patient to stand. Patient currently still unsteady when standing.

## 2019-09-09 NOTE — ED Notes (Signed)
Patient sitting up in bed watching TV. Vss awaiting bed transfer to lower level Psych unit after change of shift. Patient going to room# 306

## 2019-09-09 NOTE — ED Notes (Signed)
Patient out of room to nurse station with steady gait asking for something to drink.

## 2019-09-09 NOTE — BH Assessment (Signed)
Patient is to be admitted to [facility] by Psychiatric Nurse Practitioner Caroline Sauger.  Attending Physician will be Dr. Weber Cooks.   Patient has been assigned to room 306, by [place].     ER staff is aware of the admission:  [facility] ER Secretary    Dr. Joni Fears, ER MD   Jeanett Schlein Patient's Nurse   [facility] Patient Access.

## 2019-09-09 NOTE — ED Notes (Signed)
Pt. Resting on bed watching tv.  Pt. Aware he will be admitted downstairs.

## 2019-09-09 NOTE — Progress Notes (Signed)
D: Patient is admitted from the [facility] ED under IVC for alcohol detox and a suicide attempt by overdosing on Lamictal. Patient stated that he had been placed on Singulair and that one of the listed side effects is suicidal thoughts and that is what happened to him. Had been sober 6 years from alcohol then relapsed over the past year. Has been drinking 10-15 beers a day. Michela Pitcher his girlfriend found him "shaking violently" and he thought he may have been in DT's but he was not hallucinating. Patient currently denies SI, HI and AVH. Pleasant and cooperative. Occasionally uses powder cocaine along with alcohol but denies any other drug use. A: Continue to monitor for safety R: Safety maintained.

## 2019-09-09 NOTE — ED Notes (Signed)
Able to contact lower level Psych spoke with Meredith Mody reports unable to take report until next shift. They are pending receiving 2 patients from [facility].

## 2019-09-09 NOTE — ED Notes (Signed)
Assumed care of patient patient with steady gait, took shower this morning. Reports feeling better, states he stopped taking his medications and was having thoughts of SI. Patient speaks in low tone voice and pleasant. Awaiting lower level bed. Moved to [facility]. Safety maintained, will monitor.

## 2019-09-09 NOTE — Tx Team (Signed)
Initial Treatment Plan 09/09/2019 10:20 PM [name] [id number]    PATIENT STRESSORS: Substance abuse   PATIENT STRENGTHS: Average or above average intelligence Capable of independent living Communication skills Financial means General fund of knowledge Supportive family/friends   PATIENT IDENTIFIED PROBLEMS:     Depression   Alcohol abuse               DISCHARGE CRITERIA:  Ability to meet basic life and health needs Adequate post-discharge living arrangements Improved stabilization in mood, thinking, and/or behavior Medical problems require only outpatient monitoring Motivation to continue treatment in a less acute level of care Need for constant or close observation no longer present Reduction of life-threatening or endangering symptoms to within safe limits Safe-care adequate arrangements made Verbal commitment to aftercare and medication compliance  PRELIMINARY DISCHARGE PLAN: Outpatient therapy Return to previous living arrangement  PATIENT/FAMILY INVOLVEMENT: This treatment plan has been presented to and reviewed with the patient, Johnny Villa, and/or family member.  The patient and family have been given the opportunity to ask questions and make suggestions.  Libby Maw, RN 09/09/2019, 10:20 PM

## 2019-09-09 NOTE — ED Notes (Signed)
IVC/Consult completed/ Recommend inpatient Admission

## 2019-09-09 NOTE — ED Notes (Signed)
BEHAVIORAL HEALTH ROUNDING Patient sleeping: No. Patient alert and oriented: yes Behavior appropriate: Yes.  ; If no, describe:  Nutrition and fluids offered: yes Toileting and hygiene offered: Yes  Sitter present: q15 minute observations and security monitoring Law enforcement present: Yes    

## 2019-09-09 NOTE — Plan of Care (Signed)
  Problem: Education: Goal: Knowledge of [place] [facility] Education information/materials will improve Outcome: Progressing Goal: Emotional status will improve Outcome: Progressing Goal: Mental status will improve Outcome: Progressing Goal: Verbalization of understanding the information provided will improve Outcome: Progressing  D: Patient is admitted from the [facility] ED under IVC for alcohol detox and a suicide attempt by overdosing on Lamictal. Patient stated that he had been placed on Singulair and that one of the listed side effects is suicidal thoughts and that is what happened to him. Had been sober 6 years from alcohol then relapsed over the past year. Has been drinking 10-15 beers a day. Michela Pitcher his girlfriend found him "shaking violently" and he thought he may have been in DT's but he was not hallucinating. Patient currently denies SI, HI and AVH. Pleasant and cooperative. Occasionally uses powder cocaine along with alcohol but denies any other drug use. A: Continue to monitor for safety R: Safety maintained.

## 2019-09-09 NOTE — ED Notes (Signed)
Patient got up and went to bathroom, patient is a little unsteady on his feet, but is able to get to the bathroom on his own. Writer stood behind him but he needed no assistance

## 2019-09-09 NOTE — ED Notes (Signed)
Attempt made to call report to lower level psych unit with no success. Will attempt again. Patient to be admitted to room 306

## 2019-09-10 ENCOUNTER — Telehealth: Payer: Self-pay | Admitting: Pulmonary Disease

## 2019-09-10 DIAGNOSIS — F10239 Alcohol dependence with withdrawal, unspecified: Secondary | ICD-10-CM

## 2019-09-10 DIAGNOSIS — F10939 Alcohol use, unspecified with withdrawal, unspecified: Secondary | ICD-10-CM

## 2019-09-10 DIAGNOSIS — F3130 Bipolar disorder, current episode depressed, mild or moderate severity, unspecified: Principal | ICD-10-CM

## 2019-09-10 DIAGNOSIS — T1491XA Suicide attempt, initial encounter: Secondary | ICD-10-CM

## 2019-09-10 MED ORDER — THIAMINE HCL 100 MG/ML IJ SOLN: 100.0000 mg | Freq: Every day | INTRAMUSCULAR | Status: DC

## 2019-09-10 MED ORDER — SENNOSIDES-DOCUSATE SODIUM 8.6-50 MG PO TABS
2.0000 | ORAL_TABLET | Freq: Every evening | ORAL | Status: DC | PRN
  Filled 2019-09-10: qty 2

## 2019-09-10 MED ORDER — LORAZEPAM 2 MG/ML IJ SOLN: 1.0000 mg | INTRAMUSCULAR | Status: DC | PRN

## 2019-09-10 MED ORDER — CHLORDIAZEPOXIDE HCL 25 MG PO CAPS
25.0000 mg | ORAL_CAPSULE | ORAL | Status: AC
  Administered 2019-09-10: 15:00:00 25 mg via ORAL
  Filled 2019-09-10: qty 1

## 2019-09-10 MED ORDER — LORAZEPAM 1 MG PO TABS
1.0000 mg | ORAL_TABLET | ORAL | Status: DC | PRN
  Administered 2019-09-10 – 2019-09-11 (×3): 1 mg via ORAL
  Filled 2019-09-10 (×3): qty 1

## 2019-09-10 MED ORDER — VITAMIN B-1 100 MG PO TABS
100.0000 mg | ORAL_TABLET | Freq: Every day | ORAL | Status: DC
  Administered 2019-09-10 – 2019-09-11 (×2): 100 mg via ORAL
  Filled 2019-09-10 (×2): qty 1

## 2019-09-10 MED ORDER — FOLIC ACID 1 MG PO TABS
1.0000 mg | ORAL_TABLET | Freq: Every day | ORAL | Status: DC
  Administered 2019-09-10 – 2019-09-13 (×4): 1 mg via ORAL
  Filled 2019-09-10 (×4): qty 1

## 2019-09-10 MED ORDER — DOCUSATE SODIUM 100 MG PO CAPS
100.0000 mg | ORAL_CAPSULE | Freq: Two times a day (BID) | ORAL | Status: DC
  Administered 2019-09-10 – 2019-09-13 (×4): 100 mg via ORAL
  Filled 2019-09-10 (×4): qty 1

## 2019-09-10 MED ORDER — ADULT MULTIVITAMIN W/MINERALS CH
1.0000 | ORAL_TABLET | Freq: Every day | ORAL | Status: DC
  Administered 2019-09-10 – 2019-09-11 (×2): 1 via ORAL
  Filled 2019-09-10 (×2): qty 1

## 2019-09-10 NOTE — Progress Notes (Signed)
Pt has been calm and cooperative today. Pt has been social, gone to groups and stayed for the most part in the dayroom. He expressed having some anxiety this afternoon. Collier Bullock RN

## 2019-09-10 NOTE — H&P (Signed)
Psychiatric Admission Assessment Adult  Patient Identification: Johnny Villa MRN:  [id number] Date of Evaluation:  09/10/2019 Chief Complaint:  Bipolar Principal Diagnosis: Bipolar I disorder, most recent episode depressed ([place]) Diagnosis:  Principal Problem:   Bipolar I disorder, most recent episode depressed ([place]) Active Problems:   Hyperlipidemia   PTSD (post-traumatic stress disorder)   Alcohol use disorder, moderate, dependence ([place])   Alcohol withdrawal ([place])   Suicide attempt ([place])  History of Present Illness: Patient seen and chart reviewed.  43 year old man with a history of depression and bipolar disorder and alcohol abuse.  Came to the hospital after taking an overdose of multiple prescription medicines including Ambien and Lexapro Singulair etc.  He said that he had gotten so depressed that he wanted to die.  He relapsed into drinking about 2 weeks ago and had been drinking about 15 drinks a day and meanwhile had cut down or discontinued his psychiatric medicine.  Gradually got more sad and hopeless.  Today the patient says he is still feeling down and anxious.  He is having a feeling of alcohol withdrawal.  He denies any suicidal intent but still has hopelessness and suicidal ideation.  No homicidal ideation.  No psychotic symptoms.  Good insight.  He is positive and optimistic about improvement in his symptoms.  He has no specific reason why he relapsed saying that up until that time he felt like he was doing very well with his life. Associated Signs/Symptoms: Depression Symptoms:  depressed mood, anhedonia, psychomotor retardation, feelings of worthlessness/guilt, difficulty concentrating, hopelessness, suicidal attempt, (Hypo) Manic Symptoms:  Distractibility, Anxiety Symptoms:  Excessive Worry, Psychotic Symptoms:  None reported PTSD Symptoms: Negative Had a traumatic exposure:  Chronic anxiety and isolation related to a history of prior trauma Total Time spent with  patient: 1 hour  Past Psychiatric History: Patient has a history of depression with a diagnosis of bipolar disorder usually however presenting with depressed mood.  He does have a past history of suicide attempts.  Long history of substance abuse mostly alcohol.  When he is drinking his mood gets rapidly worse.  He is very familiar with AA and appropriate outpatient treatment and has had good success with outpatient treatment of substance abuse in the past.  Is the patient at risk to self? Yes.    Has the patient been a risk to self in the past 6 months? Yes.    Has the patient been a risk to self within the distant past? Yes.    Is the patient a risk to others? No.  Has the patient been a risk to others in the past 6 months? No.  Has the patient been a risk to others within the distant past? No.   Prior Inpatient Therapy:   Prior Outpatient Therapy:    Alcohol Screening: 1. How often do you have a drink containing alcohol?: 4 or more times a week 2. How many drinks containing alcohol do you have on a typical day when you are drinking?: 10 or more 3. How often do you have six or more drinks on one occasion?: Daily or almost daily AUDIT-C Score: 12 4. How often during the last year have you found that you were not able to stop drinking once you had started?: Daily or almost daily 5. How often during the last year have you failed to do what was normally expected from you becasue of drinking?: Daily or almost daily 6. How often during the last year have you needed a first drink  in the morning to get yourself going after a heavy drinking session?: Daily or almost daily 7. How often during the last year have you had a feeling of guilt of remorse after drinking?: Daily or almost daily 8. How often during the last year have you been unable to remember what happened the night before because you had been drinking?: Daily or almost daily 9. Have you or someone else been injured as a result of your  drinking?: Yes, during the last year 10. Has a relative or friend or a doctor or another health worker been concerned about your drinking or suggested you cut down?: Yes, during the last year Alcohol Use Disorder Identification Test Final Score (AUDIT): 40 Alcohol Brief Interventions/Follow-up: Brief Advice, Continued Monitoring, Medication Offered/Prescribed Substance Abuse History in the last 12 months:  Yes.   Consequences of Substance Abuse: Medical Consequences:  Alcohol withdrawal symptoms Previous Psychotropic Medications: Yes  Psychological Evaluations: Yes  Past Medical History:  Past Medical History:  Diagnosis Date  . Bipolar disorder [facility])    psychiatrist in [place]  . Blood in stool   . Depression   . Drug abuse in remission [facility])    sober for 6 years  . Fatty liver 01/2018  . H/O alcohol abuse   . Headache    migraines  . Hypertension   . Low testosterone in male     Past Surgical History:  Procedure Laterality Date  . APPENDECTOMY     2006  . COLONOSCOPY WITH PROPOFOL N/A 12/30/2017   Procedure: COLONOSCOPY WITH PROPOFOL;  Surgeon: Lin Landsman, MD;  Location: [facility] ENDOSCOPY;  Service: Gastroenterology;  Laterality: N/A;  . ESOPHAGOGASTRODUODENOSCOPY (EGD) WITH PROPOFOL N/A 12/30/2017   Procedure: ESOPHAGOGASTRODUODENOSCOPY (EGD) WITH PROPOFOL;  Surgeon: Lin Landsman, MD;  Location: [facility] ENDOSCOPY;  Service: Gastroenterology;  Laterality: N/A;   Family History:  Family History  Problem Relation Age of Onset  . Depression Mother   . Alcohol abuse Father   . Cancer Father        prostate dx'ed 34  . COPD Father   . Hyperlipidemia Father   . Hypertension Father    Family Psychiatric  History: See previous.  Positive history of anxiety Tobacco Screening: Have you used any form of tobacco in the last 30 days? (Cigarettes, Smokeless Tobacco, Cigars, and/or Pipes): No Social History:  Social History   Substance and Sexual Activity  Alcohol Use Not  Currently   Comment: former quit 03/14/2012     Social History   Substance and Sexual Activity  Drug Use Not Currently   Comment: former quit cocaine 03/14/2012     Additional Social History:                           Allergies:  No Known Allergies Lab Results:  Results for orders placed or performed during the hospital encounter of 09/08/19 (from the past 48 hour(s))  Urine Drug Screen, Qualitative ([place] only)     Status: Abnormal   Collection Time: 09/08/19  3:08 PM  Result Value Ref Range   Tricyclic, Ur Screen NONE DETECTED NONE DETECTED   Amphetamines, Ur Screen NONE DETECTED NONE DETECTED   MDMA (Ecstasy)Ur Screen NONE DETECTED NONE DETECTED   Cocaine Metabolite,Ur [place] NONE DETECTED NONE DETECTED   Opiate, Ur Screen NONE DETECTED NONE DETECTED   Phencyclidine (PCP) Ur S NONE DETECTED NONE DETECTED   Cannabinoid 50 Ng, Ur [place] NONE DETECTED NONE DETECTED  Barbiturates, Ur Screen NONE DETECTED NONE DETECTED   Benzodiazepine, Ur Scrn POSITIVE (A) NONE DETECTED   Methadone Scn, Ur NONE DETECTED NONE DETECTED    Comment: (NOTE) Tricyclics + metabolites, urine    Cutoff 1000 ng/mL Amphetamines + metabolites, urine  Cutoff 1000 ng/mL MDMA (Ecstasy), urine              Cutoff 500 ng/mL Cocaine Metabolite, urine          Cutoff 300 ng/mL Opiate + metabolites, urine        Cutoff 300 ng/mL Phencyclidine (PCP), urine         Cutoff 25 ng/mL Cannabinoid, urine                 Cutoff 50 ng/mL Barbiturates + metabolites, urine  Cutoff 200 ng/mL Benzodiazepine, urine              Cutoff 200 ng/mL Methadone, urine                   Cutoff 300 ng/mL The urine drug screen provides only a preliminary, unconfirmed analytical test result and should not be used for non-medical purposes. Clinical consideration and professional judgment should be applied to any positive drug screen result due to possible interfering substances. A more specific alternate chemical method must be used  in order to obtain a confirmed analytical result. Gas chromatography / mass spectrometry (GC/MS) is the preferred confirmat ory method. Performed at [facility], [address] [place], [place] [address]   Respiratory Panel by RT PCR (Flu A&B, Covid) - Nasopharyngeal Swab     Status: None   Collection Time: 09/09/19  5:53 AM   Specimen: Nasopharyngeal Swab  Result Value Ref Range   SARS Coronavirus 2 by RT PCR NEGATIVE NEGATIVE    Comment: (NOTE) SARS-CoV-2 target nucleic acids are NOT DETECTED. The SARS-CoV-2 RNA is generally detectable in upper respiratoy specimens during the acute phase of infection. The lowest concentration of SARS-CoV-2 viral copies this assay can detect is 131 copies/mL. A negative result does not preclude SARS-Cov-2 infection and should not be used as the sole basis for treatment or other patient management decisions. A negative result may occur with  improper specimen collection/handling, submission of specimen other than nasopharyngeal swab, presence of viral mutation(s) within the areas targeted by this assay, and inadequate number of viral copies (<131 copies/mL). A negative result must be combined with clinical observations, patient history, and epidemiological information. The expected result is Negative. Fact Sheet for Patients:  PinkCheek.be Fact Sheet for Healthcare Providers:  GravelBags.it This test is not yet ap proved or cleared by the [place] FDA and  has been authorized for detection and/or diagnosis of SARS-CoV-2 by FDA under an Emergency Use Authorization (EUA). This EUA will remain  in effect (meaning this test can be used) for the duration of the COVID-19 declaration under Section 564(b)(1) of the Act, 21 U.S.C. section 360bbb-3(b)(1), unless the authorization is terminated or revoked sooner.    Influenza A by PCR NEGATIVE NEGATIVE   Influenza B by PCR NEGATIVE  NEGATIVE    Comment: (NOTE) The Xpert Xpress SARS-CoV-2/FLU/RSV assay is intended as an aid in  the diagnosis of influenza from Nasopharyngeal swab specimens and  should not be used as a sole basis for treatment. Nasal washings and  aspirates are unacceptable for Xpert Xpress SARS-CoV-2/FLU/RSV  testing. Fact Sheet for Patients: PinkCheek.be Fact Sheet for Healthcare Providers: GravelBags.it This test is not yet approved or cleared  by the [place] and  has been authorized for detection and/or diagnosis of SARS-CoV-2 by  FDA under an Emergency Use Authorization (EUA). This EUA will remain  in effect (meaning this test can be used) for the duration of the  Covid-19 declaration under Section 564(b)(1) of the Act, 21  U.S.C. section 360bbb-3(b)(1), unless the authorization is  terminated or revoked. Performed at [facility], [address] [place], [place] [address]     Blood Alcohol level:  Lab Results  Component Value Date   ETH <10 09/08/2019   ETH 278 (H) 30/86/5784    Metabolic Disorder Labs:  Lab Results  Component Value Date   HGBA1C 5.5 08/10/2019   No results found for: PROLACTIN Lab Results  Component Value Date   CHOL 194 08/10/2019   TRIG 60.0 08/10/2019   HDL 56.20 08/10/2019   CHOLHDL 3 08/10/2019   VLDL 12.0 08/10/2019   LDLCALC 126 (H) 08/10/2019   LDLCALC 114 (H) 10/25/2017    Current Medications: Current Facility-Administered Medications  Medication Dose Route Frequency Provider Last Rate Last Admin  . acetaminophen (TYLENOL) tablet 650 mg  650 mg Oral Q6H PRN Caroline Sauger, NP      . albuterol (PROVENTIL) (2.5 MG/3ML) 0.083% nebulizer solution 2.5 mg  2.5 mg Inhalation Q4H PRN Caroline Sauger, NP      . alum & mag hydroxide-simeth (MAALOX/MYLANTA) 200-200-20 MG/5ML suspension 30 mL  30 mL Oral Q4H PRN Caroline Sauger, NP      . busPIRone (BUSPAR) tablet 30  mg  30 mg Oral BID Caroline Sauger, NP   30 mg at 09/10/19 0827  . chlordiazePOXIDE (LIBRIUM) capsule 25 mg  25 mg Oral NOW Clapacs, John T, MD      . clomiPHENE (CLOMID) tablet 25 mg  25 mg Oral Q M,W,F Caroline Sauger, NP   25 mg at 09/09/19 2147  . escitalopram (LEXAPRO) tablet 10 mg  10 mg Oral Daily Caroline Sauger, NP   10 mg at 09/10/19 0827  . folic acid (FOLVITE) tablet 1 mg  1 mg Oral Daily Clapacs, John T, MD      . ipratropium-albuterol (DUONEB) 0.5-2.5 (3) MG/3ML nebulizer solution 3 mL  3 mL Inhalation Q6H PRN Caroline Sauger, NP      . lamoTRIgine (LAMICTAL) tablet 200 mg  200 mg Oral Daily Caroline Sauger, NP   200 mg at 09/10/19 0827  . LORazepam (ATIVAN) tablet 1-4 mg  1-4 mg Oral Q1H PRN Clapacs, Madie Ren[place], MD       Or  . LORazepam (ATIVAN) injection 1-4 mg  1-4 mg Intravenous Q1H PRN Clapacs, John T, MD      . magnesium hydroxide (MILK OF MAGNESIA) suspension 30 mL  30 mL Oral Daily PRN Caroline Sauger, NP      . montelukast (SINGULAIR) tablet 10 mg  10 mg Oral Daily Caroline Sauger, NP      . multivitamin with minerals tablet 1 tablet  1 tablet Oral Daily Clapacs, John T, MD      . SUMAtriptan (IMITREX) tablet 100 mg  100 mg Oral Q2H PRN Caroline Sauger, NP      . thiamine (VITAMIN B-1) tablet 100 mg  100 mg Oral Daily Clapacs, John T, MD       Or  . thiamine (B-1) injection 100 mg  100 mg Intravenous Daily Clapacs, John T, MD      . zolpidem (AMBIEN) tablet 10 mg  10 mg Oral QHS PRN Caroline Sauger, NP  10 mg at 09/09/19 2149   PTA Medications: Facility-Administered Medications Prior to Admission  Medication Dose Route Frequency Provider Last Rate Last Admin  . Ipratropium-Albuterol (COMBIVENT) respimat 1 puff  1 puff Inhalation Q6H PRN Tyler Pita, MD       Medications Prior to Admission  Medication Sig Dispense Refill Last Dose  . albuterol (VENTOLIN HFA) 108 (90 Base) MCG/ACT inhaler Inhale 2 puffs into the lungs  every 4 (four) hours as needed for wheezing or shortness of breath. Or 30 minutes before exercise 18 g 11   . busPIRone (BUSPAR) 30 MG tablet Take 1 tablet (30 mg total) by mouth 2 (two) times daily. 180 tablet 1   . clomiPHENE (CLOMID) 50 MG tablet Take by mouth.     . escitalopram (LEXAPRO) 10 MG tablet Take 1 tablet (10 mg total) by mouth daily. 90 tablet 1   . lamoTRIgine (LAMICTAL) 200 MG tablet Take 1 tablet (200 mg total) by mouth daily. 90 tablet 1   . montelukast (SINGULAIR) 10 MG tablet Take 1 tablet (10 mg total) by mouth daily. 90 tablet 3   . sildenafil (REVATIO) 20 MG tablet Take 2 tablets (40 mg total) by mouth daily. Prn 60 tablet 11   . SUMAtriptan (IMITREX) 100 MG tablet May repeat in 2 hours if not helping. Max dose 200 mg in 1 day. Do not take more than 2x per week 9 tablet 11   . zolpidem (AMBIEN) 10 MG tablet Take 1 tablet (10 mg total) by mouth at bedtime as needed for sleep. 90 tablet 0     Musculoskeletal: Strength & Muscle Tone: within normal limits Gait & Station: normal Patient leans: N/A  Psychiatric Specialty Exam: Physical Exam  Nursing note and vitals reviewed. Constitutional: He appears well-developed and well-nourished.  HENT:  Head: Normocephalic and atraumatic.  Eyes: Pupils are equal, round, and reactive to light. Conjunctivae are normal.  Cardiovascular: Regular rhythm and normal heart sounds.  Respiratory: Effort normal. No respiratory distress.  GI: Soft.  Musculoskeletal:        General: Normal range of motion.     Cervical back: Normal range of motion.  Neurological: He is alert.  Skin: Skin is warm and dry.  Psychiatric: His affect is blunt. His speech is delayed. He is slowed and withdrawn. He expresses impulsivity. He exhibits a depressed mood. He expresses suicidal ideation. He expresses no suicidal plans. He exhibits abnormal recent memory.    Review of Systems  Constitutional: Negative.   HENT: Negative.   Eyes: Negative.    Respiratory: Negative.   Cardiovascular: Negative.   Gastrointestinal: Negative.   Musculoskeletal: Negative.   Skin: Negative.   Neurological: Negative.   Psychiatric/Behavioral: Positive for confusion, decreased concentration, dysphoric mood, self-injury, sleep disturbance and suicidal ideas. Negative for agitation, behavioral problems and hallucinations. The patient is nervous/anxious. The patient is not hyperactive.     Blood pressure 116/72, pulse 67, temperature 98 F (36.7 C), temperature source Oral, resp. rate 17, height _0  (1.803 m), weight 95.3 kg, SpO2 100 %.Body mass index is 29.29 kg/m.  General Appearance: Casual  Eye Contact:  Good  Speech:  Blocked  Volume:  Normal  Mood:  Euthymic  Affect:  Congruent  Thought Process:  Goal Directed  Orientation:  Full (Time, Place, and Person)  Thought Content:  Logical  Suicidal Thoughts:  Yes.  without intent/plan  Homicidal Thoughts:  No  Memory:  Immediate;   Fair Recent;   Fair Remote;  Fair  Judgement:  Fair  Insight:  Fair  Psychomotor Activity:  Normal  Concentration:  Concentration: Fair  Recall:  [employer] of Knowledge:  Fair  Language:  Fair  Akathisia:  No  Handed:  Right  AIMS (if indicated):     Assets:  Desire for Improvement Housing Physical Health Resilience  ADL's:  Intact  Cognition:  WNL  Sleep:  Number of Hours: 8    Treatment Plan Summary: Daily contact with patient to assess and evaluate symptoms and progress in treatment, Medication management and Plan Patient has been restarted on his medicine that was helpful for his mood stability in the past.  Additionally I am going to put him back on a detox protocol as he is complaining of anxiety and shakiness.  I have given him an additional dose of Librium for now as well.  Encourage group activity encourage individual counseling therapy and being forthcoming about his symptoms.  He has already met with representative from [place].  Ongoing  assessment of suicidality before arranging for appropriate discharge plan.  Observation Level/Precautions:  15 minute checks  Laboratory:  Chemistry Profile  Psychotherapy:    Medications:    Consultations:    Discharge Concerns:    Estimated LOS:  Other:     Physician Treatment Plan for Primary Diagnosis: Bipolar I disorder, most recent episode depressed ([place]) Long Term Goal(s): Improvement in symptoms so as ready for discharge  Short Term Goals: Ability to disclose and discuss suicidal ideas and Ability to demonstrate self-control will improve  Physician Treatment Plan for Secondary Diagnosis: Principal Problem:   Bipolar I disorder, most recent episode depressed ([place]) Active Problems:   Hyperlipidemia   PTSD (post-traumatic stress disorder)   Alcohol use disorder, moderate, dependence ([place])   Alcohol withdrawal ([place])   Suicide attempt ([place])  Long Term Goal(s): Improvement in symptoms so as ready for discharge  Short Term Goals: Ability to identify and develop effective coping behaviors will improve, Ability to maintain clinical measurements within normal limits will improve and Compliance with prescribed medications will improve  I certify that inpatient services furnished can reasonably be expected to improve the patient's condition.    Alethia Berthold, MD 12/10/20202:37 PM

## 2019-09-10 NOTE — Tx Team (Signed)
Interdisciplinary Treatment and Diagnostic Plan Update  09/10/2019 Time of Session: 900am Johnny Villa MRN: [id number]  Principal Diagnosis: <principal problem not specified>  Secondary Diagnoses: Active Problems:   Bipolar 1 disorder ([name])   Current Medications:  Current Facility-Administered Medications  Medication Dose Route Frequency Provider Last Rate Last Admin  . acetaminophen (TYLENOL) tablet 650 mg  650 mg Oral Q6H PRN Caroline Sauger, NP      . albuterol (PROVENTIL) (2.5 MG/3ML) 0.083% nebulizer solution 2.5 mg  2.5 mg Inhalation Q4H PRN Caroline Sauger, NP      . alum & mag hydroxide-simeth (MAALOX/MYLANTA) 200-200-20 MG/5ML suspension 30 mL  30 mL Oral Q4H PRN Caroline Sauger, NP      . busPIRone (BUSPAR) tablet 30 mg  30 mg Oral BID Caroline Sauger, NP   30 mg at 09/10/19 0827  . chlordiazePOXIDE (LIBRIUM) capsule 25 mg  25 mg Oral QID Caroline Sauger, NP   25 mg at 09/10/19 0827  . clomiPHENE (CLOMID) tablet 25 mg  25 mg Oral Q M,W,F Caroline Sauger, NP   25 mg at 09/09/19 2147  . escitalopram (LEXAPRO) tablet 10 mg  10 mg Oral Daily Caroline Sauger, NP   10 mg at 09/10/19 0827  . ipratropium-albuterol (DUONEB) 0.5-2.5 (3) MG/3ML nebulizer solution 3 mL  3 mL Inhalation Q6H PRN Caroline Sauger, NP      . lamoTRIgine (LAMICTAL) tablet 200 mg  200 mg Oral Daily Caroline Sauger, NP   200 mg at 09/10/19 0827  . magnesium hydroxide (MILK OF MAGNESIA) suspension 30 mL  30 mL Oral Daily PRN Caroline Sauger, NP      . montelukast (SINGULAIR) tablet 10 mg  10 mg Oral Daily Caroline Sauger, NP      . SUMAtriptan (IMITREX) tablet 100 mg  100 mg Oral Q2H PRN Caroline Sauger, NP      . zolpidem (AMBIEN) tablet 10 mg  10 mg Oral QHS PRN Caroline Sauger, NP   10 mg at 09/09/19 2149   PTA Medications: Facility-Administered Medications Prior to Admission  Medication Dose Route Frequency Provider Last Rate Last Admin  .  Ipratropium-Albuterol (COMBIVENT) respimat 1 puff  1 puff Inhalation Q6H PRN Tyler Pita, MD       Medications Prior to Admission  Medication Sig Dispense Refill Last Dose  . albuterol (VENTOLIN HFA) 108 (90 Base) MCG/ACT inhaler Inhale 2 puffs into the lungs every 4 (four) hours as needed for wheezing or shortness of breath. Or 30 minutes before exercise 18 g 11   . busPIRone (BUSPAR) 30 MG tablet Take 1 tablet (30 mg total) by mouth 2 (two) times daily. 180 tablet 1   . clomiPHENE (CLOMID) 50 MG tablet Take by mouth.     . escitalopram (LEXAPRO) 10 MG tablet Take 1 tablet (10 mg total) by mouth daily. 90 tablet 1   . lamoTRIgine (LAMICTAL) 200 MG tablet Take 1 tablet (200 mg total) by mouth daily. 90 tablet 1   . montelukast (SINGULAIR) 10 MG tablet Take 1 tablet (10 mg total) by mouth daily. 90 tablet 3   . sildenafil (REVATIO) 20 MG tablet Take 2 tablets (40 mg total) by mouth daily. Prn 60 tablet 11   . SUMAtriptan (IMITREX) 100 MG tablet May repeat in 2 hours if not helping. Max dose 200 mg in 1 day. Do not take more than 2x per week 9 tablet 11   . zolpidem (AMBIEN) 10 MG tablet Take 1 tablet (10 mg total) by mouth at bedtime  as needed for sleep. 90 tablet 0     Patient Stressors: Substance abuse  Patient Strengths: Average or above average intelligence Capable of independent living Occupational psychologist fund of knowledge Supportive family/friends  Treatment Modalities: Medication Management, Group therapy, Case management,  1 to 1 session with clinician, Psychoeducation, Recreational therapy.   Physician Treatment Plan for Primary Diagnosis: <principal problem not specified> Long Term Goal(s):     Short Term Goals:    Medication Management: Evaluate patient's response, side effects, and tolerance of medication regimen.  Therapeutic Interventions: 1 to 1 sessions, Unit Group sessions and Medication administration.  Evaluation of Outcomes: Not  Met  Physician Treatment Plan for Secondary Diagnosis: Active Problems:   Bipolar 1 disorder ([place])  Long Term Goal(s):     Short Term Goals:       Medication Management: Evaluate patient's response, side effects, and tolerance of medication regimen.  Therapeutic Interventions: 1 to 1 sessions, Unit Group sessions and Medication administration.  Evaluation of Outcomes: Not Met   RN Treatment Plan for Primary Diagnosis: <principal problem not specified> Long Term Goal(s): Knowledge of disease and therapeutic regimen to maintain health will improve  Short Term Goals: Ability to remain free from injury will improve, Ability to demonstrate self-control, Ability to verbalize feelings will improve, Ability to disclose and discuss suicidal ideas and Ability to identify and develop effective coping behaviors will improve  Medication Management: RN will administer medications as ordered by provider, will assess and evaluate patient's response and provide education to patient for prescribed medication. RN will report any adverse and/or side effects to prescribing provider.  Therapeutic Interventions: 1 on 1 counseling sessions, Psychoeducation, Medication administration, Evaluate responses to treatment, Monitor vital signs and CBGs as ordered, Perform/monitor CIWA, COWS, AIMS and Fall Risk screenings as ordered, Perform wound care treatments as ordered.  Evaluation of Outcomes: Not Met   LCSW Treatment Plan for Primary Diagnosis: <principal problem not specified> Long Term Goal(s): Safe transition to appropriate next level of care at discharge, Engage patient in therapeutic group addressing interpersonal concerns.  Short Term Goals: Engage patient in aftercare planning with referrals and resources, Increase ability to appropriately verbalize feelings, Increase emotional regulation and Increase skills for wellness and recovery  Therapeutic Interventions: Assess for all discharge needs, 1 to 1  time with Social worker, Explore available resources and support systems, Assess for adequacy in community support network, Educate family and significant other(s) on suicide prevention, Complete Psychosocial Assessment, Interpersonal group therapy.  Evaluation of Outcomes: Not Met   Progress in Treatment: Attending groups: No. Participating in groups: No. Taking medication as prescribed: Yes. Toleration medication: Yes. Family/Significant other contact made: No, will contact:  when consent is given Patient understands diagnosis: Yes. Discussing patient identified problems/goals with staff: Yes. Medical problems stabilized or resolved: Yes. Denies suicidal/homicidal ideation: Yes. Issues/concerns per patient self-inventory: No. Other: N/A  New problem(s) identified: No, Describe:  none  New Short Term/Long Term Goal(s): Detox, elimination of AVH/symptoms of psychosis, medication management for mood stabilization; elimination of SI thoughts; development of comprehensive mental wellness/sobriety plan.   Patient Goals:  "Get stable back on my medications and find motivation again"  Discharge Plan or Barriers: SPE pamphlet, Mobile Crisis information, and AA/NA information provided to patient for additional community support and resources. Pt is agreeable to services with [facility].  Reason for Continuation of Hospitalization: Depression Medication stabilization Suicidal ideation  Estimated Length of Stay: 3-5 days  Attendees: Patient: Johnny Villa 09/10/2019 10:08 AM  Physician:  Dr Weber Cooks MD 09/10/2019 10:08 AM  Nursing: Polly Cobia RN 09/10/2019 10:08 AM  RN Care Manager: 09/10/2019 10:08 AM  Social Worker: Minette Brine Shaley Leavens LCSW 09/10/2019 10:08 AM  Recreational Therapist: Roanna Epley CTRS LRT 09/10/2019 10:08 AM  Other: Assunta Curtis LCSW  09/10/2019 10:08 AM  Other:  09/10/2019 10:08 AM  Other: 09/10/2019 10:08 AM    Scribe for Treatment Team: Mariann Laster Ladonte Verstraete,  LCSW 09/10/2019 10:08 AM

## 2019-09-10 NOTE — Plan of Care (Signed)
Pt rates depression 5/10, anxiety 3/10 and hopelessness 2/10. Pt denies SI, HI and AVH. Pt was educated on care plan and verbalizes understanding. Collier Bullock RN Problem: Education: Goal: Knowledge of [facility] Education information/materials will improve Outcome: Progressing Goal: Emotional status will improve Outcome: Progressing Goal: Mental status will improve Outcome: Progressing Goal: Verbalization of understanding the information provided will improve Outcome: Progressing   Problem: Activity: Goal: Interest or engagement in activities will improve Outcome: Progressing Goal: Sleeping patterns will improve Outcome: Progressing   Problem: Coping: Goal: Ability to verbalize frustrations and anger appropriately will improve Outcome: Progressing Goal: Ability to demonstrate self-control will improve Outcome: Progressing   Problem: Health Behavior/Discharge Planning: Goal: Identification of resources available to assist in meeting health care needs will improve Outcome: Progressing Goal: Compliance with treatment plan for underlying cause of condition will improve Outcome: Progressing   Problem: Physical Regulation: Goal: Ability to maintain clinical measurements within normal limits will improve Outcome: Progressing   Problem: Safety: Goal: Periods of time without injury will increase Outcome: Progressing   Problem: Education: Goal: Utilization of techniques to improve thought processes will improve Outcome: Progressing Goal: Knowledge of the prescribed therapeutic regimen will improve Outcome: Progressing   Problem: Activity: Goal: Interest or engagement in leisure activities will improve Outcome: Progressing Goal: Imbalance in normal sleep/wake cycle will improve Outcome: Progressing   Problem: Coping: Goal: Coping ability will improve Outcome: Progressing Goal: Will verbalize feelings Outcome: Progressing   Problem: Health Behavior/Discharge  Planning: Goal: Ability to make decisions will improve Outcome: Progressing Goal: Compliance with therapeutic regimen will improve Outcome: Progressing   Problem: Role Relationship: Goal: Will demonstrate positive changes in social behaviors and relationships Outcome: Progressing   Problem: Safety: Goal: Ability to disclose and discuss suicidal ideas will improve Outcome: Progressing Goal: Ability to identify and utilize support systems that promote safety will improve Outcome: Progressing   Problem: Self-Concept: Goal: Will verbalize positive feelings about self Outcome: Progressing Goal: Level of anxiety will decrease Outcome: Progressing

## 2019-09-10 NOTE — BHH Suicide Risk Assessment (Signed)
[place] INPATIENT:  Family/Significant Other Suicide Prevention Education  Suicide Prevention Education:  Patient Refusal for Family/Significant Other Suicide Prevention Education: The patient Johnny Villa has refused to provide written consent for family/significant other to be provided Family/Significant Other Suicide Prevention Education during admission and/or prior to discharge.  Physician notified.  SPE completed with pt, as pt refused to consent to family contact. SPI pamphlet provided to pt and pt was encouraged to share information with support network, ask questions, and talk about any concerns relating to SPE. Pt denies access to guns/firearms and verbalized understanding of information provided. Mobile Crisis information also provided to pt.   [place] MSW LCSW 09/10/2019, 9:58 AM

## 2019-09-10 NOTE — Progress Notes (Signed)
   09/10/19 2300  Psych Admission Type (Psych Patients Only)  Admission Status Involuntary  Psychosocial Assessment  Patient Complaints Depression;Anxiety  Eye Contact Fair  Facial Expression Anxious  Affect Sad  Speech Logical/coherent  Interaction Assertive  Motor Activity Other (Comment) (WNL)  Appearance/Hygiene In scrubs  Behavior Characteristics Cooperative;Calm  Mood Depressed  Aggressive Behavior  Effect No apparent injury  Thought Process  Coherency WDL  Content WDL  Delusions None reported or observed  Perception WDL  Hallucination None reported or observed  Judgment WDL  Confusion WDL  Danger to Self  Current suicidal ideation? Denies  Self-Injurious Behavior No self-injurious ideation or behavior indicators observed or expressed   Danger to Others  Danger to Others None reported or observed   Pt is pleasant and answers all questions appropriately. Pt denies SI, HI and AVH as well as pain. Pt does say that his depression is very bad. He says that he feels better than he did this morning. Pt states that they gave him some Ativan and that took the edge off. Pt asked for more Ativan because he was having tremors but told that it was not time for another administration.

## 2019-09-10 NOTE — Progress Notes (Signed)
Recreation Therapy Notes  INPATIENT RECREATION THERAPY ASSESSMENT  Patient Details Name: Johnny Villa MRN: [id number] DOB: Nov 29, 1975 Today's Date: 09/10/2019       Information Obtained From: Patient  Able to Participate in Assessment/Interview: Yes  Patient Presentation: Responsive  Reason for Admission (Per Patient): Active Symptoms, Suicidal Ideation, Suicide Attempt, Med Non-Compliance, Substance Abuse  Patient Stressors:    Coping Skills:   Exercise, Substance Abuse  Leisure Interests (2+):  Exercise - Walking, Exercise - Running, Exercise - Jogging, Exercise - Crossfit, Exercise - Aerobics Class, Exercise - Lifting Weights(Cooking)  Frequency of Recreation/Participation: Monthly  Awareness of Community Resources:  Yes  Community Resources:  Other (Comment)(AA meeting)  Current Use:    If no, Barriers?:    Expressed Interest in [employer] Information:    [place] of Residence:  Insurance underwriter  Patient Main Form of Transportation: Musician  Patient Strengths:  Caring, loving, supportive  Patient Identified Areas of Improvement:  Being more stable  Patient Goal for Hospitalization:  To get back on medication  Current SI (including self-harm):  No  Current HI:  No  Current AVH: No  Staff Intervention Plan: Group Attendance, Collaborate with Interdisciplinary Treatment Team  Consent to Intern Participation: N/A  Johnny Villa 09/10/2019, 3:19 PM

## 2019-09-10 NOTE — BHH Group Notes (Signed)
LCSW Group Therapy Note  09/10/2019 1:58 PM  Type of Therapy/Topic:  Group Therapy:  Balance in Life  Participation Level:  Did Not Attend  Description of Group:    This group will address the concept of balance and how it feels and looks when one is unbalanced. Patients will be encouraged to process areas in their lives that are out of balance and identify reasons for remaining unbalanced. Facilitators will guide patients in utilizing problem-solving interventions to address and correct the stressor making their life unbalanced. Understanding and applying boundaries will be explored and addressed for obtaining and maintaining a balanced life. Patients will be encouraged to explore ways to assertively make their unbalanced needs known to significant others in their lives, using other group members and facilitator for support and feedback.  Therapeutic Goals: 1. Patient will identify two or more emotions or situations they have that consume much of in their lives. 2. Patient will identify signs/triggers that life has become out of balance:  3. Patient will identify two ways to set boundaries in order to achieve balance in their lives:  4. Patient will demonstrate ability to communicate their needs through discussion and/or role plays  Summary of Patient Progress: x     Therapeutic Modalities:   Cognitive Behavioral Therapy Solution-Focused Therapy Assertiveness Training  Evalina Field, MSW, LCSW Clinical Social Work 09/10/2019 1:58 PM
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
## 2019-09-10 NOTE — BHH Suicide Risk Assessment (Signed)
[facility] Admission Suicide Risk Assessment   Nursing information obtained from:  Patient Demographic factors:  Male, Caucasian Current Mental Status:  Suicidal ideation indicated by patient Loss Factors:  Decline in physical health Historical Factors:  Impulsivity Risk Reduction Factors:  Positive social support  Total Time spent with patient: 1 hour Principal Problem: Bipolar I disorder, most recent episode depressed ([place]) Diagnosis:  Principal Problem:   Bipolar I disorder, most recent episode depressed ([place]) Active Problems:   Hyperlipidemia   PTSD (post-traumatic stress disorder)   Alcohol use disorder, moderate, dependence ([place])   Alcohol withdrawal ([place])   Suicide attempt ([place])  Subjective Data: Patient seen and chart reviewed.  Patient known from previous encounters.  43 year old man with a history of severe recurrent depression with a diagnosis of bipolar disorder and a history of alcohol abuse presented to the hospital after taking an overdose of medicine with suicidal intent.  Patient today is lucid and cooperative.  He has good insight but says he still is having suicidal thoughts although no plan to act on it.  Having some tremors and shakiness from alcohol withdrawal but no hallucinations or psychotic symptoms.  Recently had relapsed into heavy drinking for a couple weeks and had stopped taking his medicine  Continued Clinical Symptoms:  Alcohol Use Disorder Identification Test Final Score (AUDIT): 40 The "Alcohol Use Disorders Identification Test", Guidelines for Use in Primary Care, Second Edition.  World Pharmacologist [facility]). Score between 0-7:  no or low risk or alcohol related problems. Score between 8-15:  moderate risk of alcohol related problems. Score between 16-19:  high risk of alcohol related problems. Score 20 or above:  warrants further diagnostic evaluation for alcohol dependence and treatment.   CLINICAL FACTORS:   Bipolar Disorder:   Depressive  phase Depression:   Comorbid alcohol abuse/dependence Alcohol/Substance Abuse/Dependencies   Musculoskeletal: Strength & Muscle Tone: within normal limits Gait & Station: normal Patient leans: N/A  Psychiatric Specialty Exam: Physical Exam  Nursing note and vitals reviewed. Constitutional: He appears well-developed and well-nourished.  HENT:  Head: Normocephalic and atraumatic.  Eyes: Pupils are equal, round, and reactive to light. Conjunctivae are normal.  Cardiovascular: Regular rhythm and normal heart sounds.  Respiratory: Effort normal. No respiratory distress.  GI: Soft.  Musculoskeletal:        General: Normal range of motion.     Cervical back: Normal range of motion.  Neurological: He is alert.  Skin: Skin is warm and dry.  Psychiatric: His speech is normal. He is agitated. Cognition and memory are normal. He expresses impulsivity. He exhibits a depressed mood. He expresses suicidal ideation. He expresses no suicidal plans.    Review of Systems  Constitutional: Negative.   HENT: Negative.   Eyes: Negative.   Respiratory: Negative.   Cardiovascular: Negative.   Gastrointestinal: Negative.   Musculoskeletal: Negative.   Skin: Negative.   Neurological: Negative.   Psychiatric/Behavioral: Positive for agitation, behavioral problems, decreased concentration, dysphoric mood, self-injury, sleep disturbance and suicidal ideas. Negative for hallucinations. The patient is nervous/anxious. The patient is not hyperactive.     Blood pressure 116/72, pulse 67, temperature 98 F (36.7 C), temperature source Oral, resp. rate 17, height 5\' 11"  (1.803 m), weight 95.3 kg, SpO2 100 %.Body mass index is 29.29 kg/m.  General Appearance: Casual  Eye Contact:  Good  Speech:  Clear and Coherent  Volume:  Normal  Mood:  Depressed and Dysphoric  Affect:  Congruent  Thought Process:  Coherent  Orientation:  Full (Time,  Place, and Person)  Thought Content:  Logical  Suicidal Thoughts:   Yes.  without intent/plan  Homicidal Thoughts:  No  Memory:  Immediate;   Fair Recent;   Poor Remote;   Fair  Judgement:  Fair  Insight:  Fair  Psychomotor Activity:  Decreased  Concentration:  Concentration: Fair  Recall:  [employer] of Knowledge:  Fair  Language:  Fair  Akathisia:  No  Handed:  Right  AIMS (if indicated):     Assets:  Desire for Improvement  ADL's:  Intact  Cognition:  WNL  Sleep:  Number of Hours: 8      COGNITIVE FEATURES THAT CONTRIBUTE TO RISK:  Loss of executive function    SUICIDE RISK:   Mild:  Suicidal ideation of limited frequency, intensity, duration, and specificity.  There are no identifiable plans, no associated intent, mild dysphoria and related symptoms, good self-control (both objective and subjective assessment), few other risk factors, and identifiable protective factors, including available and accessible social support.  PLAN OF CARE: Continue alcohol withdrawal plan.  Restart psychiatric medicine.'s supply supportive counseling.  Work with patient on improving his self-esteem and his optimism.  Reassess suicidality prior to discharge.  I certify that inpatient services furnished can reasonably be expected to improve the patient's condition.   Alethia Berthold, MD 09/10/2019, 2:30 PM

## 2019-09-10 NOTE — BHH Group Notes (Signed)
[place] Group Notes:  (Nursing/MHT/Case Management/Adjunct)  Date:  09/10/2019  Time:  8:29 PM  Type of Therapy:  Group Therapy  Participation Level:  Active  Participation Quality:  Appropriate  Affect:  Appropriate  Cognitive:  Alert  Insight:  Good  Engagement in Group:  Engaged  Modes of Intervention:  Support  Summary of Progress/Problems:  Johnny Villa 09/10/2019, 8:29 PM

## 2019-09-10 NOTE — Plan of Care (Signed)
  Problem: Education: Goal: Emotional status will improve Outcome: Progressing Goal: Mental status will improve Outcome: Progressing Goal: Verbalization of understanding the information provided will improve Outcome: Progressing   Problem: Activity: Goal: Interest or engagement in activities will improve Outcome: Progressing Goal: Sleeping patterns will improve Outcome: Progressing   Problem: Physical Regulation: Goal: Ability to maintain clinical measurements within normal limits will improve Outcome: Progressing   Problem: Health Behavior/Discharge Planning: Goal: Compliance with therapeutic regimen will improve Outcome: Progressing

## 2019-09-10 NOTE — Progress Notes (Signed)
Recreation Therapy Notes   Date: 09/10/2019  Time: 9:30 am   Location: Craft room   Behavioral response: N/A   Intervention Topic: Leisure  Discussion/Intervention: Patient did not attend group.   Clinical Observations/Feedback:  Patient did not attend group.   Kaylina Cahue LRT/CTRS        Jacob Cicero 09/10/2019 11:09 AM

## 2019-09-10 NOTE — Telephone Encounter (Signed)
Lm to relay date/time of covid test for PFT. 09/14/2019 prior to [facility]

## 2019-09-10 NOTE — Progress Notes (Signed)
Pt states that the Ativan helped "a little bit" for his anxiety. Collier Bullock RN

## 2019-09-11 MED ORDER — HYDROXYZINE HCL 25 MG PO TABS
25.0000 mg | ORAL_TABLET | Freq: Four times a day (QID) | ORAL | Status: DC | PRN
  Administered 2019-09-11 – 2019-09-12 (×3): 25 mg via ORAL
  Filled 2019-09-11 (×3): qty 1

## 2019-09-11 MED ORDER — CHLORDIAZEPOXIDE HCL 25 MG PO CAPS: 25.0000 mg | ORAL_CAPSULE | Freq: Four times a day (QID) | ORAL | Status: DC | PRN

## 2019-09-11 MED ORDER — LOPERAMIDE HCL 2 MG PO CAPS: 2.0000 mg | ORAL_CAPSULE | ORAL | Status: DC | PRN

## 2019-09-11 MED ORDER — ONDANSETRON 4 MG PO TBDP: 4.0000 mg | ORAL_TABLET | Freq: Four times a day (QID) | ORAL | Status: DC | PRN

## 2019-09-11 MED ORDER — ADULT MULTIVITAMIN W/MINERALS CH
1.0000 | ORAL_TABLET | Freq: Every day | ORAL | Status: DC
  Administered 2019-09-11 – 2019-09-13 (×3): 1 via ORAL
  Filled 2019-09-11 (×3): qty 1

## 2019-09-11 MED ORDER — VITAMIN B-1 100 MG PO TABS
100.0000 mg | ORAL_TABLET | Freq: Every day | ORAL | Status: DC
  Administered 2019-09-12 – 2019-09-13 (×2): 100 mg via ORAL
  Filled 2019-09-11 (×2): qty 1

## 2019-09-11 NOTE — Progress Notes (Signed)
[facility] MD Progress Note  09/11/2019 1:37 PM Tymire Bivins  MRN:  [id number]   Subjective: Follow-up for this patient diagnosed with bipolar 1 disorder Relapsed on alcohol approximately 2 weeks ago.  Patient states that he is still feeling depressed today.  Denies having any active suicidal thoughts.  He states that "to do it to myself but would prefer not to wake up in the mornings."  Patient denies having any thoughts of homicide and denies having any hallucinations.  Patient also denies having any withdrawal symptoms today.  He states that he has been thinking about things that he will do when he is discharged from the hospital and states that he is planning on going back to [place] and is considering residential treatment however he states that his insurance will not pay for it.  Principal Problem: Bipolar I disorder, most recent episode depressed ([place]) Diagnosis: Principal Problem:   Bipolar I disorder, most recent episode depressed ([place]) Active Problems:   Hyperlipidemia   PTSD (post-traumatic stress disorder)   Alcohol use disorder, moderate, dependence ([place])   Alcohol withdrawal ([place])   Suicide attempt ([place])  Total Time spent with patient: 30 minutes  Past Psychiatric History: Patient has a history of depression with a diagnosis of bipolar disorder usually however presenting with depressed mood.  He does have a past history of suicide attempts.  Long history of substance abuse mostly alcohol.  When he is drinking his mood gets rapidly worse.  He is very familiar with AA and appropriate outpatient treatment and has had good success with outpatient treatment of substance abuse in the past.  Past Medical History:  Past Medical History:  Diagnosis Date  . Bipolar disorder [facility])    psychiatrist in [place]  . Blood in stool   . Depression   . Drug abuse in remission [facility])    sober for 6 years  . Fatty liver 01/2018  . H/O alcohol abuse   . Headache    migraines  . Hypertension   . Low  testosterone in male     Past Surgical History:  Procedure Laterality Date  . APPENDECTOMY     2006  . COLONOSCOPY WITH PROPOFOL N/A 12/30/2017   Procedure: COLONOSCOPY WITH PROPOFOL;  Surgeon: Lin Landsman, MD;  Location: [facility] ENDOSCOPY;  Service: Gastroenterology;  Laterality: N/A;  . ESOPHAGOGASTRODUODENOSCOPY (EGD) WITH PROPOFOL N/A 12/30/2017   Procedure: ESOPHAGOGASTRODUODENOSCOPY (EGD) WITH PROPOFOL;  Surgeon: Lin Landsman, MD;  Location: [facility] ENDOSCOPY;  Service: Gastroenterology;  Laterality: N/A;   Family History:  Family History  Problem Relation Age of Onset  . Depression Mother   . Alcohol abuse Father   . Cancer Father        prostate dx'ed 27  . COPD Father   . Hyperlipidemia Father   . Hypertension Father    Family Psychiatric  History: See above and anxiety Social History:  Social History   Substance and Sexual Activity  Alcohol Use Not Currently   Comment: former quit 03/14/2012     Social History   Substance and Sexual Activity  Drug Use Not Currently   Comment: former quit cocaine 03/14/2012     Social History   Socioeconomic History  . Marital status: Single    Spouse name: Not on file  . Number of children: Not on file  . Years of education: Not on file  . Highest education level: Not on file  Occupational History  . Occupation: disabled  Tobacco Use  . Smoking status:  Former Smoker    Packs/day: 0.25    Years: 2.00    Pack years: 0.50    Types: Cigarettes    Quit date: 1999    Years since quitting: 21.9  . Smokeless tobacco: Former Systems developer    Types: Chew  Substance and Sexual Activity  . Alcohol use: Not Currently    Comment: former quit 03/14/2012  . Drug use: Not Currently    Comment: former quit cocaine 03/14/2012   . Sexual activity: Yes  Other Topics Concern  . 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 Difficulty of Paying Living Expenses: Not on file  Food Insecurity:   . Worried About Charity fundraiser in the Last Year: Not on file  . Ran Out of Food in the Last Year: Not on file  Transportation Needs:   . Lack of Transportation (Medical): Not on file  . Lack of Transportation (Non-Medical): Not on file  Physical Activity:   . Days of Exercise per Week: Not on file  . Minutes of Exercise per Session: Not on file  Stress:   . Feeling of Stress : Not on file  Social Connections:   . Frequency of Communication with Friends and Family: Not on file  . Frequency of Social Gatherings with Friends and Family: Not on file  . Attends Religious Services: Not on file  . Active Member of Clubs or Organizations: Not on file  . Attends Archivist Meetings: Not on file  . Marital Status: Not on file   Additional Social History:                         Sleep: Good  Appetite:  Good  Current Medications: Current Facility-Administered Medications  Medication Dose Route Frequency Provider Last Rate Last Admin  . acetaminophen (TYLENOL) tablet 650 mg  650 mg Oral Q6H PRN Caroline Sauger, NP      . albuterol (PROVENTIL) (2.5 MG/3ML) 0.083% nebulizer solution 2.5 mg  2.5 mg Inhalation Q4H PRN Caroline Sauger, NP      . alum & mag hydroxide-simeth (MAALOX/MYLANTA) 200-200-20 MG/5ML suspension 30 mL  30 mL Oral Q4H PRN Caroline Sauger, NP      . busPIRone (BUSPAR) tablet 30 mg  30 mg Oral BID Caroline Sauger, NP   30 mg at 09/11/19 0805  . chlordiazePOXIDE (LIBRIUM) capsule 25 mg  25 mg Oral Q6H PRN Lorraine Terriquez, Lowry Ram, FNP      . clomiPHENE (CLOMID) tablet 25 mg  25 mg Oral Q M,W,F Caroline Sauger, NP   25 mg at 09/11/19 1217  . docusate sodium (COLACE) capsule 100 mg  100 mg Oral BID Clapacs, Madie Reno, MD   100 mg at 09/11/19 0805  . escitalopram (LEXAPRO) tablet 10 mg  10 mg Oral Daily Caroline Sauger, NP   10  mg at 09/11/19 0805  . folic acid (FOLVITE) tablet 1 mg  1 mg Oral Daily Clapacs, Madie Reno, MD   1 mg at 09/11/19 0805  . hydrOXYzine (ATARAX/VISTARIL) tablet 25 mg  25 mg Oral Q6H PRN Tramaine Snell, Darnelle Maffucci B, FNP      . ipratropium-albuterol (DUONEB) 0.5-2.5 (3) MG/3ML nebulizer solution 3 mL  3 mL Inhalation Q6H  PRN Caroline Sauger, NP      . lamoTRIgine (LAMICTAL) tablet 200 mg  200 mg Oral Daily Caroline Sauger, NP   200 mg at 09/11/19 0805  . loperamide (IMODIUM) capsule 2-4 mg  2-4 mg Oral PRN Roy Tokarz, Lowry Ram, FNP      . magnesium hydroxide (MILK OF MAGNESIA) suspension 30 mL  30 mL Oral Daily PRN Caroline Sauger, NP      . montelukast (SINGULAIR) tablet 10 mg  10 mg Oral Daily Caroline Sauger, NP      . multivitamin with minerals tablet 1 tablet  1 tablet Oral Daily Dejae Bernet, Lowry Ram, FNP   1 tablet at 09/11/19 1221  . ondansetron (ZOFRAN-ODT) disintegrating tablet 4 mg  4 mg Oral Q6H PRN Zulma Court, Lowry Ram, FNP      . senna-docusate (Senokot-S) tablet 2 tablet  2 tablet Oral QHS PRN Clapacs, Madie Reno, MD      . SUMAtriptan (IMITREX) tablet 100 mg  100 mg Oral Q2H PRN Caroline Sauger, NP      . Derrill Memo ON 09/12/2019] thiamine (VITAMIN B-1) tablet 100 mg  100 mg Oral Daily Domnique Vantine, Darnelle Maffucci B, FNP      . zolpidem (AMBIEN) tablet 10 mg  10 mg Oral QHS PRN Caroline Sauger, NP   10 mg at 09/10/19 2129    Lab Results: No results found for this or any previous visit (from the past 48 hour(s)).  Blood Alcohol level:  Lab Results  Component Value Date   ETH <10 09/08/2019   ETH 278 (H) [id number]    Metabolic Disorder Labs: Lab Results  Component Value Date   HGBA1C 5.5 08/10/2019   No results found for: PROLACTIN Lab Results  Component Value Date   CHOL 194 08/10/2019   TRIG 60.0 08/10/2019   HDL 56.20 08/10/2019   CHOLHDL 3 08/10/2019   VLDL 12.0 08/10/2019   LDLCALC 126 (H) 08/10/2019   LDLCALC 114 (H) 10/25/2017    Physical Findings: AIMS: Facial and Oral  Movements Muscles of Facial Expression: None, normal Lips and Perioral Area: None, normal Jaw: None, normal Tongue: None, normal,Extremity Movements Upper (arms, wrists, hands, fingers): None, normal Lower (legs, knees, ankles, toes): None, normal, Trunk Movements Neck, shoulders, hips: None, normal, Overall Severity Severity of abnormal movements (highest score from questions above): None, normal Incapacitation due to abnormal movements: None, normal Patient's awareness of abnormal movements (rate only patient's report): No Awareness, Dental Status Current problems with teeth and/or dentures?: No Does patient usually wear dentures?: No  CIWA:  CIWA-Ar Total: 5 COWS:     Musculoskeletal: Strength & Muscle Tone: within normal limits Gait & Station: normal Patient leans: N/A  Psychiatric Specialty Exam: Physical Exam  Nursing note and vitals reviewed. Constitutional: He is oriented to person, place, and time. He appears well-developed and well-nourished.  Respiratory: Effort normal.  Musculoskeletal:        General: Normal range of motion.  Neurological: He is alert and oriented to person, place, and time.  Skin: Skin is warm.    Review of Systems  Constitutional: Negative.   HENT: Negative.   Eyes: Negative.   Respiratory: Negative.   Cardiovascular: Negative.   Gastrointestinal: Negative.   Genitourinary: Negative.   Musculoskeletal: Negative.   Skin: Negative.   Neurological: Negative.   Psychiatric/Behavioral: Positive for suicidal ideas.    Blood pressure (!) 141/88, pulse (!) 56, temperature 97.8 F (36.6 C), temperature source Oral, resp. rate 18, height 5\' 11"  (1.803 m), weight 95.3 kg, SpO2  100 %.Body mass index is 29.29 kg/m.  General Appearance: Casual  Eye Contact:  Good  Speech:  Clear and Coherent and Normal Rate  Volume:  Decreased  Mood:  Depressed  Affect:  Flat  Thought Process:  Coherent and Descriptions of Associations: Intact  Orientation:   Full (Time, Place, and Person)  Thought Content:  WDL  Suicidal Thoughts:  Would prefer to never wake up but doesn't want to hurt himself  Homicidal Thoughts:  No  Memory:  Immediate;   Good Recent;   Good Remote;   Good  Judgement:  Fair  Insight:  Fair  Psychomotor Activity:  Normal  Concentration:  Concentration: Fair  Recall:  [place] of Knowledge:  Fair  Language:  Good  Akathisia:  No  Handed:  Right  AIMS (if indicated):     Assets:  Communication Skills Desire for Improvement Financial Resources/Insurance Housing Physical Health Resilience  ADL's:  Intact  Cognition:  WNL  Sleep:  Number of Hours: 7   Assessment: Patient presents in his room lying in the bed and is asleep but is easily awakened.  Patient is pleasant, calm, and cooperative at the time of the interview.  Patient does present with flat affect and does appear to be depressed.  He has not been showing any symptoms or signs of responding to internal stimuli or having any paranoid or delusional thinking.  Discussed with patient the possibilities of residential treatment after discharge and I have informed the CSW about the patient's request.  It does appear that his insurance will probably not pay for his substance abuse treatment that was the patient has stated that he will not attend if he has to pay for out-of-pocket.  CSW did state that she was working on trying to find the patient some type of SA IOP that he could attend that his insurance would cover.  Treatment Plan Summary: Daily contact with patient to assess and evaluate symptoms and progress in treatment and Medication management Continue BuSpar 30 mg p.o. twice daily for anxiety Continue Lexapro 10 mg p.o. daily for depression Continue Lamictal 200 mg p.o. daily for bipolar disorder Continue Ambien 10 mg p.o. nightly as needed for insomnia Ativan detox has been discontinued Start Librium detox protocol with the every 6 hours as needed for CIWA  greater than 10 Encourage group therapy participation Continue every 15 minute safety checks  Lewis Shock, FNP 09/11/2019, 1:37 PM

## 2019-09-11 NOTE — Plan of Care (Signed)
Patient has stayed in the milieu, cooperative and calm. Continues to exhibit depressive symptoms but slightly improving. Receptive upon approach and taking medications voluntarily as prescribed. Denying thoughts of self harm. Denying hallucinations. Reports that appetite is improved. CIWA= 2 from mild anxiety. Staff continue to provide support and encouragements.

## 2019-09-11 NOTE — Progress Notes (Signed)
CSW spoke with [facility] regarding SAIOP program and whether pt insurance would cover the cost. Ramond Craver reported that pt would have to call the number on the back of his insurance card to see how much they cover for IOP.  CSW spoke with pt about this and pt reported that his insurance does not cover and substance abuse services, just mental health.   Evalina Field, MSW, LCSW Clinical Social Work 09/11/2019 2:32 PM

## 2019-09-11 NOTE — Telephone Encounter (Signed)
Pt is currently admitted

## 2019-09-11 NOTE — BHH Group Notes (Signed)
LCSW Group Therapy Note  09/11/2019 12:50 PM  Type of Therapy and Topic:  Group Therapy:  Feelings around Relapse and Recovery  Participation Level:  Active   Description of Group:    Patients in this group will discuss emotions they experience before and after a relapse. They will process how experiencing these feelings, or avoidance of experiencing them, relates to having a relapse. Facilitator will guide patients to explore emotions they have related to recovery. Patients will be encouraged to process which emotions are more powerful. They will be guided to discuss the emotional reaction significant others in their lives may have to their relapse or recovery. Patients will be assisted in exploring ways to respond to the emotions of others without this contributing to a relapse.  Therapeutic Goals: 1. Patient will identify two or more emotions that lead to a relapse for them 2. Patient will identify two emotions that result when they relapse 3. Patient will identify two emotions related to recovery 4. Patient will demonstrate ability to communicate their needs through discussion and/or role plays   Summary of Patient Progress: Pt was present and active in group.  Patient participated in group discussion and was supportive of other group members.  Pt shared that he has struggled with alcohol addiction.  He reports that he has struggled with the cravings the most.  He identified cooking, his pet and exercise as his coping mechanisms.    Therapeutic Modalities:   Cognitive Behavioral Therapy Solution-Focused Therapy Assertiveness Training Relapse Prevention Therapy   Assunta Curtis, MSW, LCSW 09/11/2019 12:50 PM

## 2019-09-11 NOTE — Progress Notes (Signed)
Recreation Therapy Notes    Date: 09/11/2019  Time: 9:30 am  Location: Craft room  Behavioral response: Appropriate  Intervention Topic: Relaxation    Discussion/Intervention:  Group content today was focused on relaxation. The group defined relaxation and identified healthy ways to relax. Individuals expressed how much time they spend relaxing. Patients expressed how much their life would be if they did not make time for themselves to relax. The group stated ways they could improve their relaxation techniques in the future.  Individuals participated in the intervention "Time to Relax" where they had a chance to experience different relaxation techniques.  Clinical Observations/Feedback:  Patient came to group and defined relaxation as going for a walk. He stated that he relaxes 1-2 hours a day normally watching a movie or exercising. Individual explained that relaxation reduces stress. Participant was social with peers and staff in group while participating in the intervention.  Maci Eickholt LRT/CTRS           Nneoma Harral 09/11/2019 11:23 AM

## 2019-09-12 LAB — RESPIRATORY PANEL BY RT PCR (FLU A&B, COVID)
Influenza A by PCR: NEGATIVE
Influenza B by PCR: NEGATIVE
SARS Coronavirus 2 by RT PCR: NEGATIVE

## 2019-09-12 NOTE — Plan of Care (Signed)
  Problem: Education: Goal: Emotional status will improve Outcome: Progressing Goal: Mental status will improve Outcome: Progressing Goal: Verbalization of understanding the information provided will improve Outcome: Progressing   Problem: Activity: Goal: Interest or engagement in activities will improve Outcome: Progressing Goal: Sleeping patterns will improve Outcome: Progressing   Problem: Coping: Goal: Ability to demonstrate self-control will improve Outcome: Progressing

## 2019-09-12 NOTE — Plan of Care (Signed)
Patient denies SI/HI/AVH. Patient recent CIWA scoring was 0. Patient is discharged focused at this time. Patient denies any anxiety or depression at this time. Patient is adherent with scheduled medication. Patient is in milieu frequently interacting with peers.    Problem: Education: Goal: Knowledge of [place] [facility] Education information/materials will improve Outcome: Progressing Goal: Emotional status will improve Outcome: Progressing Goal: Mental status will improve Outcome: Progressing

## 2019-09-12 NOTE — [facility] Group Notes (Signed)
Type of Therapy and Topic:  Group Therapy:  Trust and Honesty 09/12/2019  1:30pm Participation Level:  Active   Description of Group:     In this group patients will be asked to explore the value of being honest.  Patients will be guided to discuss their thoughts, feelings, and behaviors related to honesty and trusting in others. Patients will process together how trust and honesty relate to forming relationships with peers, family members, and self. Each patient will be challenged to identify and express feelings of being vulnerable. Patients will discuss reasons why people are dishonest and identify alternative outcomes if one was truthful (to self or others). This group will be process-oriented, with patients participating in exploration of their own experiences, giving and receiving support, and processing challenge from other group members.    Therapeutic Goals:  1.  Patient will identify why honesty is important to relationships and how honesty overall affects relationships.  2.  Patient will identify a situation where they lied or were lied too and the  feelings, thought process, and behaviors surrounding the situation  3.  Patient will identify the meaning of being vulnerable, how that feels, and how that correlates to being honest with self and others.  4.  Patient will identify situations where they could have told the truth, but instead lied and explain reasons of dishonesty.     Summary of Patient Progress: Treveyon shared that he has struggled with being honest and trustworthy with others when he drinks alcohol. He shared that he has been lied to by an ex-girlfriend who was unfaithful to him. Patient stated that he can trust his supports, which includes his sponsor.  Therapeutic Modalities:   Cognitive Behavioral Therapy Solution Focused Therapy Motivational Interviewing Brief Therapy

## 2019-09-12 NOTE — Progress Notes (Addendum)
   09/11/19 2300  Psych Admission Type (Psych Patients Only)  Admission Status Involuntary  Psychosocial Assessment  Patient Complaints Anxiety;Depression  Eye Contact Fair  Facial Expression Sad;Anxious  Affect Depressed  Speech Logical/coherent  Interaction Assertive  Motor Activity Other (Comment) (WNL)  Appearance/Hygiene In scrubs  Behavior Characteristics Cooperative;Appropriate to situation  Mood Pleasant;Depressed  Aggressive Behavior  Effect No apparent injury  Thought Process  Coherency WDL  Content WDL  Delusions None reported or observed  Perception WDL  Hallucination None reported or observed  Judgment WDL  Confusion WDL  Danger to Self  Current suicidal ideation? Denies  Self-Injurious Behavior No self-injurious ideation or behavior indicators observed or expressed   Danger to Others  Danger to Others None reported or observed   Pt is calm and cooperative; participating in the milieu, watching a holiday movie. Pt denies current SI, HI and AVH also pain. Pt admits SI earlier in the day d/t hearing loud music. He has insight into his condition. He states that the music was some sort of trigger for him and he knew he had to remove himself from his current surroundings and get some medication for agitation. Pt commended for his insight. Says that his goal is outpatient therapy and returning to [facility] along with medication management.

## 2019-09-12 NOTE — Progress Notes (Signed)
[facility] MD Progress Note  09/12/2019 10:20 AM Johnny Villa  MRN:  [id number] Subjective: Patient is a 43 year old male admitted on 09/10/2019 with a past psychiatric history significant for depression and bipolar disorder as well as alcohol use disorder.  He presented to the [facility] after taking an overdose of medications including Ambien, Lexapro and Singulair.  Objective: Patient is seen and examined.  Patient is a 43 year old male with the above-stated past psychiatric history who is seen in follow-up.  His main question today is about discharge.  He stated he is fine and denied any suicidal or homicidal ideation.  He denied any auditory or visual hallucinations.  Review of his progress notes from yesterday basically reaffirmed what he is saying.  He again denied any alcohol withdrawal symptoms.  He stated that he plans on following up with AA.  He stated he had relapsed on alcohol approximately 2 weeks ago.  His vital signs are stable, he is afebrile.  His CIWA this morning was 0.  He slept 6.5 hours last night.  He continues on as needed Librium, Clomid, Lexapro, folic acid, Lamictal and Ambien.  Review of his laboratories on admission revealed an elevated glucose at 173, normal liver function enzymes, mildly low sodium and potassium.  His CBC was normal.  Drug screen was positive for benzodiazepines, and his blood alcohol was less than 10.  He had a hemoglobin A1c ordered for 09/10/2019, but that was not done.  Principal Problem: Bipolar I disorder, most recent episode depressed ([place]) Diagnosis: Principal Problem:   Bipolar I disorder, most recent episode depressed ([place]) Active Problems:   Hyperlipidemia   PTSD (post-traumatic stress disorder)   Alcohol use disorder, moderate, dependence ([place])   Alcohol withdrawal ([place])   Suicide attempt ([place])  Total Time spent with patient: 20 minutes  Past Psychiatric History: See admission H&P  Past Medical History:  Past  Medical History:  Diagnosis Date  . Bipolar disorder [facility])    psychiatrist in [place]   . Blood in stool   . Depression   . Drug abuse in remission [facility])    sober for 6 years  . Fatty liver 01/2018  . H/O alcohol abuse   . Headache    migraines  . Hypertension   . Low testosterone in male     Past Surgical History:  Procedure Laterality Date  . APPENDECTOMY     2006  . COLONOSCOPY WITH PROPOFOL N/A 12/30/2017   Procedure: COLONOSCOPY WITH PROPOFOL;  Surgeon: Lin Landsman, MD;  Location: [facility] ENDOSCOPY;  Service: Gastroenterology;  Laterality: N/A;  . ESOPHAGOGASTRODUODENOSCOPY (EGD) WITH PROPOFOL N/A 12/30/2017   Procedure: ESOPHAGOGASTRODUODENOSCOPY (EGD) WITH PROPOFOL;  Surgeon: Lin Landsman, MD;  Location: [facility] ENDOSCOPY;  Service: Gastroenterology;  Laterality: N/A;   Family History:  Family History  Problem Relation Age of Onset  . Depression Mother   . Alcohol abuse Father   . Cancer Father        prostate dx'ed 38  . COPD Father   . Hyperlipidemia Father   . Hypertension Father    Family Psychiatric  History: See admission H&P Social History:  Social History   Substance and Sexual Activity  Alcohol Use Not Currently   Comment: former quit 03/14/2012     Social History   Substance and Sexual Activity  Drug Use Not Currently   Comment: former quit cocaine 03/14/2012     Social History   Socioeconomic History  . Marital status: Single  Spouse name: Not on file  . Number of children: Not on file  . Years of education: Not on file  . Highest education level: Not on file  Occupational History  . Occupation: disabled  Tobacco Use  . Smoking status: Former Smoker    Packs/day: 0.25    Years: 2.00    Pack years: 0.50    Types: Cigarettes    Quit date: 1999    Years since quitting: 21.9  . Smokeless tobacco: Former Systems developer    Types: Chew  Substance and Sexual Activity  . Alcohol use: Not Currently    Comment: former quit 03/14/2012  . Drug use:  Not Currently    Comment: former quit cocaine 03/14/2012   . Sexual activity: Yes  Other Topics Concern  . 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 Difficulty of Paying Living Expenses: Not on file  Food Insecurity:   . Worried About Charity fundraiser in the Last Year: Not on file  . Ran Out of Food in the Last Year: Not on file  Transportation Needs:   . Lack of Transportation (Medical): Not on file  . Lack of Transportation (Non-Medical): Not on file  Physical Activity:   . Days of Exercise per Week: Not on file  . Minutes of Exercise per Session: Not on file  Stress:   . Feeling of Stress : Not on file  Social Connections:   . Frequency of Communication with Friends and Family: Not on file  . Frequency of Social Gatherings with Friends and Family: Not on file  . Attends Religious Services: Not on file  . Active Member of Clubs or Organizations: Not on file  . Attends Archivist Meetings: Not on file  . Marital Status: Not on file   Additional Social History:                         Sleep: Fair  Appetite:  Good  Current Medications: Current Facility-Administered Medications  Medication Dose Route Frequency Provider Last Rate Last Admin  . acetaminophen (TYLENOL) tablet 650 mg  650 mg Oral Q6H PRN Caroline Sauger, NP      . albuterol (PROVENTIL) (2.5 MG/3ML) 0.083% nebulizer solution 2.5 mg  2.5 mg Inhalation Q4H PRN Caroline Sauger, NP      . alum & mag hydroxide-simeth (MAALOX/MYLANTA) 200-200-20 MG/5ML suspension 30 mL  30 mL Oral Q4H PRN Caroline Sauger, NP      . busPIRone (BUSPAR) tablet 30 mg  30 mg Oral BID Caroline Sauger, NP   30 mg at 09/12/19 0809  . chlordiazePOXIDE (LIBRIUM) capsule 25 mg  25 mg Oral Q6H PRN Money, Lowry Ram, FNP      . clomiPHENE (CLOMID)  tablet 25 mg  25 mg Oral Q M,W,F Caroline Sauger, NP   25 mg at 09/11/19 1217  . docusate sodium (COLACE) capsule 100 mg  100 mg Oral BID Clapacs, Madie Reno, MD   100 mg at 09/12/19 0808  . escitalopram (LEXAPRO) tablet 10 mg  10 mg Oral Daily Caroline Sauger, NP   10 mg at 09/12/19 [id number]  . folic acid (FOLVITE) tablet 1 mg  1 mg Oral Daily Clapacs, Madie Reno, MD  1 mg at 09/12/19 0809  . hydrOXYzine (ATARAX/VISTARIL) tablet 25 mg  25 mg Oral Q6H PRN Money, Lowry Ram, FNP   25 mg at 09/12/19 [id number]  . ipratropium-albuterol (DUONEB) 0.5-2.5 (3) MG/3ML nebulizer solution 3 mL  3 mL Inhalation Q6H PRN Caroline Sauger, NP      . lamoTRIgine (LAMICTAL) tablet 200 mg  200 mg Oral Daily Caroline Sauger, NP   200 mg at 09/12/19 0808  . loperamide (IMODIUM) capsule 2-4 mg  2-4 mg Oral PRN Money, Lowry Ram, FNP      . magnesium hydroxide (MILK OF MAGNESIA) suspension 30 mL  30 mL Oral Daily PRN Caroline Sauger, NP      . montelukast (SINGULAIR) tablet 10 mg  10 mg Oral Daily Caroline Sauger, NP      . multivitamin with minerals tablet 1 tablet  1 tablet Oral Daily Money, Lowry Ram, FNP   1 tablet at 09/12/19 [id number]  . ondansetron (ZOFRAN-ODT) disintegrating tablet 4 mg  4 mg Oral Q6H PRN Money, Lowry Ram, FNP      . senna-docusate (Senokot-S) tablet 2 tablet  2 tablet Oral QHS PRN Clapacs, John T, MD      . SUMAtriptan (IMITREX) tablet 100 mg  100 mg Oral Q2H PRN Caroline Sauger, NP      . thiamine (VITAMIN B-1) tablet 100 mg  100 mg Oral Daily Money, Lowry Ram, FNP   100 mg at 09/12/19 0809  . zolpidem (AMBIEN) tablet 10 mg  10 mg Oral QHS PRN Caroline Sauger, NP   10 mg at 09/11/19 2133    Lab Results: No results found for this or any previous visit (from the past 48 hour(s)).  Blood Alcohol level:  Lab Results  Component Value Date   ETH <10 09/08/2019   ETH 278 (H) [id number]    Metabolic Disorder Labs: Lab Results  Component Value Date   HGBA1C 5.5 08/10/2019   No  results found for: PROLACTIN Lab Results  Component Value Date   CHOL 194 08/10/2019   TRIG 60.0 08/10/2019   HDL 56.20 08/10/2019   CHOLHDL 3 08/10/2019   VLDL 12.0 08/10/2019   LDLCALC 126 (H) 08/10/2019   LDLCALC 114 (H) 10/25/2017    Physical Findings: AIMS: Facial and Oral Movements Muscles of Facial Expression: None, normal Lips and Perioral Area: None, normal Jaw: None, normal Tongue: None, normal,Extremity Movements Upper (arms, wrists, hands, fingers): None, normal Lower (legs, knees, ankles, toes): None, normal, Trunk Movements Neck, shoulders, hips: None, normal, Overall Severity Severity of abnormal movements (highest score from questions above): None, normal Incapacitation due to abnormal movements: None, normal Patient's awareness of abnormal movements (rate only patient's report): No Awareness, Dental Status Current problems with teeth and/or dentures?: No Does patient usually wear dentures?: No  CIWA:  CIWA-Ar Total: 0 COWS:     Musculoskeletal: Strength & Muscle Tone: within normal limits Gait & Station: normal Patient leans: N/A  Psychiatric Specialty Exam: Physical Exam  Nursing note and vitals reviewed. Constitutional: He is oriented to person, place, and time. He appears well-developed and well-nourished.  HENT:  Head: Normocephalic and atraumatic.  Respiratory: Effort normal.  Neurological: He is alert and oriented to person, place, and time.    Review of Systems  Blood pressure 122/71, pulse (!) 52, temperature 97.9 F (36.6 C), temperature source Oral, resp. rate 18, height 5\' 11"  (1.803 m), weight 95.3 kg, SpO2 99 %.Body mass index is 29.29 kg/m.  General Appearance: Casual  Eye Contact:  Fair  Speech:  Normal Rate  Volume:  Normal  Mood:  Euthymic  Affect:  Congruent  Thought Process:  Coherent and Descriptions of Associations: Intact  Orientation:  Full (Time, Place, and Person)  Thought Content:  Logical  Suicidal Thoughts:  No   Homicidal Thoughts:  No  Memory:  Immediate;   Fair Recent;   Fair Remote;   Fair  Judgement:  Intact  Insight:  Fair  Psychomotor Activity:  Normal  Concentration:  Concentration: Fair and Attention Span: Fair  Recall:  [employer] of Knowledge:  Good  Language:  Fair  Akathisia:  Negative  Handed:  Right  AIMS (if indicated):     Assets:  Desire for Improvement Resilience  ADL's:  Intact  Cognition:  WNL  Sleep:  Number of Hours: 6.5     Treatment Plan Summary: Daily contact with patient to assess and evaluate symptoms and progress in treatment, Medication management and Plan : Patient is seen and examined.  Patient is a 43 year old male with the above-stated past psychiatric history who is seen in follow-up.   Diagnosis: #1 bipolar disorder, most recently mixed, severe, #2 alcohol use disorder, #3 hypogonadism, #4 hyperglycemia  Patient is seen in follow-up.  He seems to be doing very well.  I think the plan is for discharge on Monday or Tuesday if things continue to go well.  No change in his current medications.  He has had elevated blood sugar throughout the course of the hospitalization, but a hemoglobin A1c has not been obtained.  I will order that today.  No other changes to his medicines. 1.  Continue albuterol as needed for wheezing. 2.  Continue BuSpar 30 mg p.o. twice daily for anxiety. 3.  Continue Librium 25 mg p.o. every 6 hours as needed a CIWA greater than 10. 4.  Continue Clomid 25 mg q. Monday, Wednesday and Friday for hypogonadism. 5.  Continue Lexapro 10 mg p.o. daily for depression. 6.  Continue hydroxyzine 25 mg p.o. every 6 hours as needed anxiety. 7.  Continue Lamictal 200 mg p.o. daily for mood stability. 8.  Continue Singulair 10 mg p.o. daily for asthma symptoms. 9.  Continue Imitrex 100 mg p.o. every 2 hours as needed migraine headache. 10.  Continue thiamine 100 mg p.o. daily for nutritional supplementation. 11.  Continue Ambien 10 mg p.o. nightly  as needed insomnia. 12.  Reorder hemoglobin A1c and electrolytes. 13.  Disposition planning-in progress.  Sharma Covert, MD 09/12/2019, 10:20 AM

## 2019-09-13 LAB — PHOSPHORUS: Phosphorus: 3.4 mg/dL (ref 2.5–4.6)

## 2019-09-13 LAB — COMPREHENSIVE METABOLIC PANEL
ALT: 19 U/L (ref 0–44)
AST: 17 U/L (ref 15–41)
Albumin: 4 g/dL (ref 3.5–5.0)
Alkaline Phosphatase: 38 U/L (ref 38–126)
Anion gap: 10 (ref 5–15)
BUN: 13 mg/dL (ref 6–20)
CO2: 25 mmol/L (ref 22–32)
Calcium: 9.2 mg/dL (ref 8.9–10.3)
Chloride: 104 mmol/L (ref 98–111)
Creatinine, Ser: 0.99 mg/dL (ref 0.61–1.24)
GFR calc Af Amer: >60 mL/min (ref >60)
GFR calc non Af Amer: >60 mL/min (ref >60)
Glucose, Bld: 130 mg/dL — ABNORMAL HIGH (ref 70–99)
Potassium: 4.1 mmol/L (ref 3.5–5.1)
Sodium: 139 mmol/L (ref 135–145)
Total Bilirubin: 1 mg/dL (ref 0.3–1.2)
Total Protein: 6.9 g/dL (ref 6.5–8.1)

## 2019-09-13 LAB — CBC
HCT: 44.4 % (ref 39.0–52.0)
Hemoglobin: 15.3 g/dL (ref 13.0–17.0)
MCH: 31.5 pg (ref 26.0–34.0)
MCHC: 34.5 g/dL (ref 30.0–36.0)
MCV: 91.4 fL (ref 80.0–100.0)
Platelets: 245 10*3/uL (ref 150–400)
RBC: 4.86 MIL/uL (ref 4.22–5.81)
RDW: 12.9 % (ref 11.5–15.5)
WBC: 6.1 10*3/uL (ref 4.0–10.5)
nRBC: 0 % (ref 0.0–0.2)

## 2019-09-13 LAB — MAGNESIUM: Magnesium: 2.3 mg/dL (ref 1.7–2.4)

## 2019-09-13 LAB — HEMOGLOBIN A1C
Hgb A1c MFr Bld: 5.6 % (ref 4.8–5.6)
Mean Plasma Glucose: 114.02 mg/dL

## 2019-09-13 MED ORDER — HYDROXYZINE HCL 25 MG PO TABS: 25.0000 mg | ORAL_TABLET | Freq: Four times a day (QID) | ORAL | 0 refills | Status: DC | PRN

## 2019-09-13 NOTE — Discharge Summary (Signed)
Physician Discharge Summary Note  Patient:  Johnny Villa is an 43 y.o., male MRN:  [id number] DOB:  27-Sep-1976 Patient phone:  [phone] (home)  Patient address:   [address] [place] [address],  Total Time spent with patient: 30 minutes  Date of Admission:  09/09/2019 Date of Discharge: 09/13/2019  Reason for Admission: Patient was admitted on 09/10/2019 with a history of depression and bipolar disorder as well as alcohol abuse.  He came to the hospital after taking an intentional overdose of medications including Ambien, Lexapro and Singulair.  Principal Problem: Bipolar I disorder, most recent episode depressed ([place]) Discharge Diagnoses: Principal Problem:   Bipolar I disorder, most recent episode depressed ([place]) Active Problems:   Hyperlipidemia   PTSD (post-traumatic stress disorder)   Alcohol use disorder, moderate, dependence ([place])   Alcohol withdrawal ([place])   Suicide attempt [facility])   Past Psychiatric History: See admission H&P  Past Medical History:  Past Medical History:  Diagnosis Date  . Bipolar disorder [facility])    psychiatrist in [place]  . Blood in stool   . Depression   . Drug abuse in remission [facility])    sober for 6 years  . Fatty liver 01/2018  . H/O alcohol abuse   . Headache    migraines  . Hypertension   . Low testosterone in male     Past Surgical History:  Procedure Laterality Date  . APPENDECTOMY     2006  . COLONOSCOPY WITH PROPOFOL N/A 12/30/2017   Procedure: COLONOSCOPY WITH PROPOFOL;  Surgeon: Lin Landsman, MD;  Location: [facility] ENDOSCOPY;  Service: Gastroenterology;  Laterality: N/A;  . ESOPHAGOGASTRODUODENOSCOPY (EGD) WITH PROPOFOL N/A 12/30/2017   Procedure: ESOPHAGOGASTRODUODENOSCOPY (EGD) WITH PROPOFOL;  Surgeon: Lin Landsman, MD;  Location: [facility] ENDOSCOPY;  Service: Gastroenterology;  Laterality: N/A;   Family History:  Family History  Problem Relation Age of Onset  . Depression Mother   . Alcohol abuse Father   .  Cancer Father        prostate dx'ed 78  . COPD Father   . Hyperlipidemia Father   . Hypertension Father    Family Psychiatric  History: See admission H&P Social History:  Social History   Substance and Sexual Activity  Alcohol Use Not Currently   Comment: former quit 03/14/2012     Social History   Substance and Sexual Activity  Drug Use Not Currently   Comment: former quit cocaine 03/14/2012     Social History   Socioeconomic History  . Marital status: Single    Spouse name: Not on file  . Number of children: Not on file  . Years of education: Not on file  . Highest education level: Not on file  Occupational History  . Occupation: disabled  Tobacco Use  . Smoking status: Former Smoker    Packs/day: 0.25    Years: 2.00    Pack years: 0.50    Types: Cigarettes    Quit date: 1999    Years since quitting: 21.9  . Smokeless tobacco: Former Systems developer    Types: Chew  Substance and Sexual Activity  . Alcohol use: Not Currently    Comment: former quit 03/14/2012  . Drug use: Not Currently    Comment: former quit cocaine 03/14/2012   . Sexual activity: Yes  Other Topics Concern  . 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 Difficulty of Paying Living Expenses: Not on file  Food Insecurity:   . Worried About Charity fundraiser in the Last Year: Not on file  . Ran Out of Food in the Last Year: Not on file  Transportation Needs:   . Lack of Transportation (Medical): Not on file  . Lack of Transportation (Non-Medical): Not on file  Physical Activity:   . Days of Exercise per Week: Not on file  . Minutes of Exercise per Session: Not on file  Stress:   . Feeling of Stress : Not on file  Social Connections:   . Frequency of Communication with Friends and Family: Not on file  . Frequency of Social Gatherings with  Friends and Family: Not on file  . Attends Religious Services: Not on file  . Active Member of Clubs or Organizations: Not on file  . Attends Archivist Meetings: Not on file  . Marital Status: Not on file    Hospital Course: Patient is seen and examined.  Patient is a 43 year old male with a past psychiatric history significant for bipolar disorder and alcohol use disorder.  He was admitted on 09/10/2019 after an intentional overdose.  He stated that he had recently gotten so depressed that he wanted to die.  He relapsed on alcohol approximately 2 weeks prior to admission.  At that time he had been drinking 15 drinks a day.  He came to the hospital and was admitted to the hospital.  He was placed on a Librium detox protocol, and his medications were continued including Lexapro and Lamictal.  He did well throughout the course of the hospitalization.  On 09/13/2019 he denied any side effects to his medications.  He denied any suicidal or homicidal ideation.  There is no evidence of psychosis.  It was decided he could be discharged that date.  His laboratories from 12/13 showed essentially normal electrolytes, a normal CBC with differential, negative coronavirus.  Physical Findings: AIMS: Facial and Oral Movements Muscles of Facial Expression: None, normal Lips and Perioral Area: None, normal Jaw: None, normal Tongue: None, normal,Extremity Movements Upper (arms, wrists, hands, fingers): None, normal Lower (legs, knees, ankles, toes): None, normal, Trunk Movements Neck, shoulders, hips: None, normal, Overall Severity Severity of abnormal movements (highest score from questions above): None, normal Incapacitation due to abnormal movements: None, normal Patient's awareness of abnormal movements (rate only patient's report): No Awareness, Dental Status Current problems with teeth and/or dentures?: No Does patient usually wear dentures?: No  CIWA:  CIWA-Ar Total: 0 COWS:      Musculoskeletal: Strength & Muscle Tone: within normal limits Gait & Station: normal Patient leans: N/A  Psychiatric Specialty Exam: Physical Exam  Nursing note and vitals reviewed. Constitutional: He is oriented to person, place, and time. He appears well-developed and well-nourished.  HENT:  Head: Normocephalic and atraumatic.  Respiratory: Effort normal.  Neurological: He is alert and oriented to person, place, and time.    Review of Systems  Blood pressure 101/62, pulse (!) 54, temperature 98 F (36.7 C), temperature source Oral, resp. rate 18, height 5\' 11"  (1.803 m), weight 95.3 kg, SpO2 100 %.Body mass index is 29.29 kg/m.  General Appearance: Casual  Eye Contact:  Good  Speech:  Normal Rate  Volume:  Normal  Mood:  Anxious  Affect:  Congruent  Thought Process:  Coherent and Descriptions of Associations: Intact  Orientation:  Full (Time, Place, and Person)  Thought Content:  Logical  Suicidal Thoughts:  No  Homicidal Thoughts:  No  Memory:  Immediate;   Good Recent;   Good Remote;   Good  Judgement:  Intact  Insight:  Fair  Psychomotor Activity:  Increased  Concentration:  Concentration: Fair and Attention Span: Fair  Recall:  [place] of Knowledge:  Good  Language:  Good  Akathisia:  Negative  Handed:  Right  AIMS (if indicated):     Assets:  Desire for Improvement Resilience  ADL's:  Intact  Cognition:  WNL  Sleep:  Number of Hours: 7.5     Have you used any form of tobacco in the last 30 days? (Cigarettes, Smokeless Tobacco, Cigars, and/or Pipes): No  Has this patient used any form of tobacco in the last 30 days? (Cigarettes, Smokeless Tobacco, Cigars, and/or Pipes) Yes, No  Blood Alcohol level:  Lab Results  Component Value Date   ETH <10 09/08/2019   ETH 278 (H) [id number]    Metabolic Disorder Labs:  Lab Results  Component Value Date   HGBA1C 5.5 08/10/2019   No results found for: PROLACTIN Lab Results  Component Value Date   CHOL  194 08/10/2019   TRIG 60.0 08/10/2019   HDL 56.20 08/10/2019   CHOLHDL 3 08/10/2019   VLDL 12.0 08/10/2019   LDLCALC 126 (H) 08/10/2019   LDLCALC 114 (H) 10/25/2017    See Psychiatric Specialty Exam and Suicide Risk Assessment completed by Attending Physician prior to discharge.  Discharge destination:  Home  Is patient on multiple antipsychotic therapies at discharge:  No   Has Patient had three or more failed trials of antipsychotic monotherapy by history:  No  Recommended Plan for Multiple Antipsychotic Therapies: NA  Discharge Instructions    Diet - low sodium heart healthy   Complete by: As directed    Increase activity slowly   Complete by: As directed      Allergies as of 09/13/2019   No Known Allergies     Medication List    STOP taking these medications   albuterol 108 (90 Base) MCG/ACT inhaler Commonly known as: VENTOLIN HFA   sildenafil 20 MG tablet Commonly known as: REVATIO   SUMAtriptan 100 MG tablet Commonly known as: IMITREX     TAKE these medications     Indication  busPIRone 30 MG tablet Commonly known as: BUSPAR Take 1 tablet (30 mg total) by mouth 2 (two) times daily.  Indication: Anxiety Disorder, Major Depressive Disorder   clomiPHENE 50 MG tablet Commonly known as: CLOMID Take by mouth.  Indication: hypogonadism   escitalopram 10 MG tablet Commonly known as: LEXAPRO Take 1 tablet (10 mg total) by mouth daily.  Indication: Generalized Anxiety Disorder, Major Depressive Disorder   hydrOXYzine 25 MG tablet Commonly known as: ATARAX/VISTARIL Take 1 tablet (25 mg total) by mouth every 6 (six) hours as needed for anxiety (or CIWA score </= 10).  Indication: Feeling Anxious   lamoTRIgine 200 MG tablet Commonly known as: LAMICTAL Take 1 tablet (200 mg total) by mouth daily.  Indication: Depressive Phase of Manic-Depression   montelukast 10 MG tablet Commonly known as: SINGULAIR Take 1 tablet (10 mg total) by mouth daily.   Indication: Asthma   zolpidem 10 MG tablet Commonly known as: Ambien Take 1 tablet (10 mg total) by mouth at bedtime as needed for sleep.  Indication: Trouble Sleeping      Follow-up Information    [facility] Follow up.   Specialty: Behavioral Health Why: You are scheduled for a televisit with Dr. Shea Evans on 09/16/19 at [place] information: [place] [phone]          Follow-up recommendations:  Activity:  ad lib  Comments:  Contact Dr Shea Evans on 09/14/19 and schedule follow up appointment for 7-10 days  Signed: Sharma Covert, MD 09/13/2019, 11:50 AM

## 2019-09-13 NOTE — Progress Notes (Signed)
Cooperative with treatment, pleasant on approach, and patient verbalized understanding. Patient room moved to green hall, patient instructed to stay  In room until further notice.

## 2019-09-13 NOTE — Progress Notes (Signed)
  [facility] Adult Case Management Discharge Plan :  Will you be returning to the same living situation after discharge:  Yes,  pt will return home  At discharge, do you have transportation home?: Yes,  pt will be picked up by a friend Do you have the ability to pay for your medications: Yes,  pt has Medicare  Release of information consent forms completed and in the chart;  Patient's signature needed at discharge.  Patient to Follow up at: Follow-up Information    [facility] Follow up.   Specialty: Behavioral Health Why: You are scheduled for a televisit with Dr. Shea Evans on 09/16/19 at [place] information: [place] [phone]          Next level of care provider has access to [place] and Suicide Prevention discussed: Yes,  CSW discussed with pt   Have you used any form of tobacco in the last 30 days? (Cigarettes, Smokeless Tobacco, Cigars, and/or Pipes): No  Has patient been referred to the Quitline?: N/A patient is not a smoker  Patient has been referred for addiction treatment: N/A  Ryer Asato  CUEBAS-COLON, LCSW 09/13/2019, 10:36 AM

## 2019-09-13 NOTE — Progress Notes (Signed)
Patient ID: Johnny Villa, male   DOB: [birth year], 43 y.o.   MRN: [id number]  Discharge Note:  Patient denies SI/HI/AVH at this time. Discharge instructions, AVS, prescriptions, and transition record gone over with patient. Patient agrees to comply with medication management, follow-up visit, and outpatient therapy. Patient belongings returned to patient. Patient  questions and concerns addressed and answered. Patient ambulatory off unit. Patient discharged to home with Dad.

## 2019-09-13 NOTE — [facility] Group Notes (Signed)
Group cancelled due to COVID-19.  Johnny Villa  CUEBAS-COLON, LCSW  09/13/2019, 11:30AM

## 2019-09-13 NOTE — [facility] Suicide Risk Assessment (Signed)
[facility] Discharge Suicide Risk Assessment   Principal Problem: Bipolar I disorder, most recent episode depressed ([place]) Discharge Diagnoses: Principal Problem:   Bipolar I disorder, most recent episode depressed ([place]) Active Problems:   Hyperlipidemia   PTSD (post-traumatic stress disorder)   Alcohol use disorder, moderate, dependence ([place])   Alcohol withdrawal ([place])   Suicide attempt ([place])   Total Time spent with patient: 15 minutes  Musculoskeletal: Strength & Muscle Tone: within normal limits Gait & Station: normal Patient leans: N/A  Psychiatric Specialty Exam: Review of Systems  All other systems reviewed and are negative.   Blood pressure 101/62, pulse (!) 54, temperature 98 F (36.7 C), temperature source Oral, resp. rate 18, height 5\' 11"  (1.803 m), weight 95.3 kg, SpO2 100 %.Body mass index is 29.29 kg/m.  General Appearance: Casual  Eye Contact::  Good  Speech:  Normal Rate409  Volume:  Normal  Mood:  Anxious  Affect:  Congruent  Thought Process:  Coherent and Descriptions of Associations: Intact  Orientation:  Full (Time, Place, and Person)  Thought Content:  Logical  Suicidal Thoughts:  No  Homicidal Thoughts:  No  Memory:  Immediate;   Fair Recent;   Fair Remote;   Fair  Judgement:  Intact  Insight:  Fair  Psychomotor Activity:  Normal  Concentration:  Good  Recall:  Good  Fund of Knowledge:Good  Language: Good  Akathisia:  Negative  Handed:  Right  AIMS (if indicated):     Assets:  Communication Skills Desire for Improvement Housing Resilience Social Support Talents/Skills  Sleep:  Number of Hours: 7.5  Cognition: WNL  ADL's:  Intact   Mental Status Per Nursing Assessment::   On Admission:  Suicidal ideation indicated by patient  Demographic Factors:  Male and Caucasian  Loss Factors: NA  Historical Factors: Impulsivity  Risk Reduction Factors:   Positive social support, Positive therapeutic relationship and Positive coping skills or  problem solving skills  Continued Clinical Symptoms:  Bipolar Disorder:   Depressive phase  Cognitive Features That Contribute To Risk:  None    Suicide Risk:  Minimal: No identifiable suicidal ideation.  Patients presenting with no risk factors but with morbid ruminations; may be classified as minimal risk based on the severity of the depressive symptoms  Follow-up Information    [facility] Follow up.   Specialty: Behavioral Health Why: You are scheduled for a televisit with Dr. Shea Evans on 09/16/19 at [place] information: [place] [address] [address] [place] [phone]          Plan Of Care/Follow-up recommendations:  Activity:  ad lib  Sharma Covert, MD 09/13/2019, 9:04 AM

## 2019-09-14 NOTE — Telephone Encounter (Signed)
Called and spoke to pt, who stated that he would like to cancel PFT.  He will call back to reschedule.

## 2019-09-14 NOTE — Telephone Encounter (Signed)
PFT canceled and per pt request did not r/s at this time.  Pt stated that he would call back to R/S.  Nothing else needed at this time. Rhonda J Cobb

## 2019-09-14 NOTE — Telephone Encounter (Signed)
[facility] for Cardiology Pulmonary to return call to cancel PFT. Rhonda J Cobb

## 2019-09-15 ENCOUNTER — Ambulatory Visit: Payer: Medicare PPO

## 2019-09-16 ENCOUNTER — Ambulatory Visit (INDEPENDENT_AMBULATORY_CARE_PROVIDER_SITE_OTHER): Payer: Medicare PPO | Admitting: Psychiatry

## 2019-09-16 ENCOUNTER — Other Ambulatory Visit: Payer: Self-pay

## 2019-09-16 ENCOUNTER — Encounter: Payer: Self-pay | Admitting: Psychiatry

## 2019-09-16 DIAGNOSIS — F1421 Cocaine dependence, in remission: Secondary | ICD-10-CM | POA: Diagnosis not present

## 2019-09-16 DIAGNOSIS — F5105 Insomnia due to other mental disorder: Secondary | ICD-10-CM

## 2019-09-16 DIAGNOSIS — F1021 Alcohol dependence, in remission: Secondary | ICD-10-CM

## 2019-09-16 DIAGNOSIS — F3176 Bipolar disorder, in full remission, most recent episode depressed: Secondary | ICD-10-CM

## 2019-09-16 DIAGNOSIS — F4310 Post-traumatic stress disorder, unspecified: Secondary | ICD-10-CM | POA: Diagnosis not present

## 2019-09-16 MED ORDER — HYDROXYZINE HCL 25 MG PO TABS: 25.0000 mg | ORAL_TABLET | Freq: Four times a day (QID) | ORAL | 1 refills | Status: DC | PRN

## 2019-09-16 NOTE — Progress Notes (Signed)
Virtual Visit via Video Note  I connected with Johnny Villa on 09/16/19 at 11:00 AM EST by a video enabled telemedicine application and verified that I am speaking with the correct person using two identifiers.   I discussed the limitations of evaluation and management by telemedicine and the availability of in person appointments. The patient expressed understanding and agreed to proceed.     I discussed the assessment and treatment plan with the patient. The patient was provided an opportunity to ask questions and all were answered. The patient agreed with the plan and demonstrated an understanding of the instructions.   The patient was advised to call back or seek an in-person evaluation if the symptoms worsen or if the condition fails to improve as anticipated.   [place] MD OP Progress Note  09/16/2019 12:44 PM Johnny Villa  MRN:  [id number]  Chief Complaint:  Chief Complaint    Follow-up     HPI: Johnny Villa is a 43 year old male, currently lives in [place], disabled, has a history of PTSD, bipolar disorder, panic attacks, alcohol use disorder , migraine headaches, cocaine use disorder in remission was evaluated by telemedicine today.  Patient today reports he was recently started on Singulair.  He reports his provider did warn him that Singulair can make him suicidal.  Patient reports he started feeling suicidal soon after he started the medication.  He reports he took an overdose of naltrexone sample packs given to him by his previous psychiatrist a year ago.  Patient reports his roommate however was able to get him help.  Patient today reports ever since stopping the Singulair his mood symptoms are better.  He denies any significant mood swings or depressive symptoms at this time.  He reports sleep continues to be good.  Patient reports he was started on Librium protocol while admitted to the hospital because of having tremors.  He reports he had shakiness of his hands likely  due to overdosing on naltrexone.  Patient however did not elaborate on whether he drank alcohol or relapsed prior to getting admitted to the hospital or not.  Patient today denies suicidality, homicidality or perceptual disturbances.  Patient continues to attend AA meetings several times a day.  Patient today reports he is interested in individual therapy.   I have reviewed medical records in [facility] per Dr. Weber Cooks as well as Dr. Meriel Flavors 09/09/2019 -  09/13/2019- " patient admitted on 09/10/2019 with history of depression, bipolar disorder and alcohol abuse.  He came to the hospital after intentional overdose of medications including Ambien, Lexapro and Singulair.  He relapsed on alcohol approximately 2 weeks prior to admission.  At that time he had been drinking 15 drinks a day.  He came to the hospital and was admitted to the hospital.  He was placed on Librium detox protocol.  He did well throughout the course of the hospitalization on 09/13/2019 he denied any side effects.  Patient discharged with follow-up with outpatient.'   Visit Diagnosis:    ICD-10-CM   1. PTSD (post-traumatic stress disorder)  F43.10   2. Bipolar disorder, in full remission, most recent episode depressed (Johnny Villa)  F31.76   3. Insomnia due to mental disorder  F51.05   4. Alcohol use disorder, moderate, in early remission ([name])  F10.21 hydrOXYzine (ATARAX/VISTARIL) 25 MG tablet  5. Cocaine use disorder, moderate, in sustained remission ([name])  F14.21     Past Psychiatric History: Reviewed past psychiatric history from my progress note on 05/04/2019.  Past trials of  Klonopin, Lexapro, Rexulti, Wellbutrin, lamotrigine, lithium, BuSpar, Vraylar.  Past Medical History:  Past Medical History:  Diagnosis Date  . Bipolar disorder [facility])    psychiatrist in [place]  . Blood in stool   . Depression   . Drug abuse in remission [facility])    sober for 6 years  . Fatty liver 01/2018  . H/O alcohol abuse   . Headache    migraines   . Hypertension   . Low testosterone in male     Past Surgical History:  Procedure Laterality Date  . APPENDECTOMY     2006  . COLONOSCOPY WITH PROPOFOL N/A 12/30/2017   Procedure: COLONOSCOPY WITH PROPOFOL;  Surgeon: Lin Landsman, MD;  Location: [facility] ENDOSCOPY;  Service: Gastroenterology;  Laterality: N/A;  . ESOPHAGOGASTRODUODENOSCOPY (EGD) WITH PROPOFOL N/A 12/30/2017   Procedure: ESOPHAGOGASTRODUODENOSCOPY (EGD) WITH PROPOFOL;  Surgeon: Lin Landsman, MD;  Location: [facility] ENDOSCOPY;  Service: Gastroenterology;  Laterality: N/A;    Family Psychiatric History: Reviewed family psychiatric history from my progress note on 05/04/2019.  Family History:  Family History  Problem Relation Age of Onset  . Depression Mother   . Alcohol abuse Father   . Cancer Father        prostate dx'ed 62  . COPD Father   . Hyperlipidemia Father   . Hypertension Father     Social History: Reviewed social history from my progress note on 05/04/2019. Social History   Socioeconomic History  . Marital status: Single    Spouse name: Not on file  . Number of children: Not on file  . Years of education: Not on file  . Highest education level: Not on file  Occupational History  . Occupation: disabled  Tobacco Use  . Smoking status: Former Smoker    Packs/day: 0.25    Years: 2.00    Pack years: 0.50    Types: Cigarettes    Quit date: 1999    Years since quitting: 21.9  . Smokeless tobacco: Former Systems developer    Types: Chew  Substance and Sexual Activity  . Alcohol use: Not Currently    Comment: former quit 03/14/2012  . Drug use: Not Currently    Comment: former quit cocaine 03/14/2012   . Sexual activity: Yes  Other Topics Concern  . 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 Difficulty of Paying Living Expenses: Not on file  Food  Insecurity:   . Worried About Charity fundraiser in the Last Year: Not on file  . Ran Out of Food in the Last Year: Not on file  Transportation Needs:   . Lack of Transportation (Medical): Not on file  . Lack of Transportation (Non-Medical): Not on file  Physical Activity:   . Days of Exercise per Week: Not on file  . Minutes of Exercise per Session: Not on file  Stress:   . Feeling of Stress : Not on file  Social Connections:   . Frequency of Communication with Friends and Family: Not on file  . Frequency of Social Gatherings with Friends and Family: Not on file  . Attends Religious Services: Not on file  . Active Member of Clubs or Organizations: Not on file  . Attends Archivist Meetings: Not on  file  . Marital Status: Not on file    Allergies: No Known Allergies  Metabolic Disorder Labs: Lab Results  Component Value Date   HGBA1C 5.6 09/13/2019   MPG 114.02 09/13/2019   No results found for: PROLACTIN Lab Results  Component Value Date   CHOL 194 08/10/2019   TRIG 60.0 08/10/2019   HDL 56.20 08/10/2019   CHOLHDL 3 08/10/2019   VLDL 12.0 08/10/2019   LDLCALC 126 (H) 08/10/2019   LDLCALC 114 (H) 10/25/2017   Lab Results  Component Value Date   TSH 1.76 08/10/2019   TSH 0.97 06/23/2018    Therapeutic Level Labs: Lab Results  Component Value Date   LITHIUM 0.4 (L) 06/23/2018   LITHIUM 0.8 10/25/2017   No results found for: VALPROATE No components found for:  CBMZ  Current Medications: Current Outpatient Medications  Medication Sig Dispense Refill  . busPIRone (BUSPAR) 30 MG tablet Take 1 tablet (30 mg total) by mouth 2 (two) times daily. 180 tablet 1  . clomiPHENE (CLOMID) 50 MG tablet Take by mouth.    . escitalopram (LEXAPRO) 10 MG tablet Take 1 tablet (10 mg total) by mouth daily. 90 tablet 1  . hydrOXYzine (ATARAX/VISTARIL) 25 MG tablet Take 1 tablet (25 mg total) by mouth every 6 (six) hours as needed for anxiety. 90 tablet 1  . lamoTRIgine  (LAMICTAL) 200 MG tablet Take 1 tablet (200 mg total) by mouth daily. 90 tablet 1  . montelukast (SINGULAIR) 10 MG tablet Take 1 tablet (10 mg total) by mouth daily. 90 tablet 3  . zolpidem (AMBIEN) 10 MG tablet Take 1 tablet (10 mg total) by mouth at bedtime as needed for sleep. 90 tablet 0   No current facility-administered medications for this visit.     Musculoskeletal: Strength & Muscle Tone: UTA Gait & Station: normal Patient leans: N/A  Psychiatric Specialty Exam: Review of Systems  Psychiatric/Behavioral: Negative for agitation, behavioral problems, confusion, decreased concentration, dysphoric mood, hallucinations, self-injury, sleep disturbance and suicidal ideas. The patient is not nervous/anxious and is not hyperactive.   All other systems reviewed and are negative.   There were no vitals taken for this visit.There is no height or weight on file to calculate BMI.  General Appearance: Casual  Eye Contact:  Fair  Speech:  Clear and Coherent  Volume:  Normal  Mood:  Euthymic  Affect:  Congruent  Thought Process:  Goal Directed and Descriptions of Associations: Intact  Orientation:  Full (Time, Place, and Person)  Thought Content: Logical   Suicidal Thoughts:  No  Homicidal Thoughts:  No  Memory:  Immediate;   Fair Recent;   Fair Remote;   Fair  Judgement:  Fair  Insight:  Fair  Psychomotor Activity:  Normal  Concentration:  Concentration: Fair and Attention Span: Fair  Recall:  [employer] of Knowledge: Fair  Language: Fair  Akathisia:  No  Handed:  Right  AIMS (if indicated): Denies tremors, rigidity  Assets:  Communication Skills Desire for Improvement Housing Social Support  ADL's:  Intact  Cognition: WNL  Sleep:  Fair   Screenings: AIMS     Admission (Discharged) from 09/09/2019 in [place] Total Score  0    AUDIT     Admission (Discharged) from 09/09/2019 in [place] Admission (Discharged)  from 06/01/2019 in [place]  Alcohol Use Disorder Identification Test Final Score (AUDIT)  40  33    PHQ2-9  Office Visit from 07/09/2019 in [place] from 06/26/2019 in [facility] Office Visit from 06/24/2018 in [place] Office Visit from 06/19/2018 in [facility] Procedure visit from 05/19/2018 in [place]  PHQ-2 Total Score  2  0  0  0  0  PHQ-9 Total Score  4  --  --  --  --       Assessment and Plan: Johnny Villa is a 43 year old male, divorced, lives in [place], has a history of PTSD, panic attacks, bipolar disorder, alcohol and cocaine use disorder, migraine headaches was evaluated by telemedicine today.  Patient is biologically predisposed given his history of substance abuse problems in the past as well as history of trauma.  Patient recently had a suicide attempt after being started on Singulair.  Patient likely also relapsed on alcohol although he does not elaborate on it.  Patient will benefit from individual psychotherapy session and he is motivated to do so.  Patient will continue to attend AA meetings.  Plan as noted below.  Plan PTSD-stable Lexapro 10 mg p.o. daily Ambien 5 to 10 mg p.o. nightly Patient advised to limit the use of Ambien since it is habit-forming.  Advised to use melatonin over-the-counter.  Panic attacks-stable Lexapro as prescribed.   Bipolar disorder in remission Lamictal 200 mg p.o. daily  Alcohol alcohol use disorder-recent relapse, early remission. Patient will continue attending [place] meetings. Discussed with patient that he can be referred to individual therapy-I have communicated with referral coordinator Ms. Gates Rigg.  Cocaine use disorder in remission Patient continues to be sober.  I have reviewed medical records in [facility] per Dr. Weber Cooks  as well as Dr. Mallie Darting dated- 12/9 - 09/13/2019 -as summarized above.  Follow-up in clinic in 4 weeks or sooner if needed.  Patient has upcoming appointment on January 22.  I have spent atleast 25 minutes non  face to face with patient today. More than 50 % of the time was spent for psychoeducation and supportive psychotherapy and care coordination. This note was generated in part or whole with voice recognition software. Voice recognition is usually quite accurate but there are transcription errors that can and very often do occur. I apologize for any typographical errors that were not detected and corrected.       Ursula Alert, MD 09/16/2019, 12:44 PM

## 2019-09-30 ENCOUNTER — Ambulatory Visit: Payer: Medicare PPO | Admitting: Pulmonary Disease

## 2019-10-07 ENCOUNTER — Ambulatory Visit (HOSPITAL_COMMUNITY): Payer: Medicare PPO | Admitting: Licensed Clinical Social Worker

## 2019-10-23 ENCOUNTER — Encounter: Payer: Self-pay | Admitting: Psychiatry

## 2019-10-23 ENCOUNTER — Ambulatory Visit (INDEPENDENT_AMBULATORY_CARE_PROVIDER_SITE_OTHER): Payer: Medicare PPO | Admitting: Psychiatry

## 2019-10-23 ENCOUNTER — Other Ambulatory Visit: Payer: Self-pay

## 2019-10-23 DIAGNOSIS — F5105 Insomnia due to other mental disorder: Secondary | ICD-10-CM

## 2019-10-23 DIAGNOSIS — F1421 Cocaine dependence, in remission: Secondary | ICD-10-CM

## 2019-10-23 DIAGNOSIS — F3132 Bipolar disorder, current episode depressed, moderate: Secondary | ICD-10-CM

## 2019-10-23 DIAGNOSIS — F3176 Bipolar disorder, in full remission, most recent episode depressed: Secondary | ICD-10-CM | POA: Insufficient documentation

## 2019-10-23 DIAGNOSIS — F1021 Alcohol dependence, in remission: Secondary | ICD-10-CM

## 2019-10-23 DIAGNOSIS — F4310 Post-traumatic stress disorder, unspecified: Secondary | ICD-10-CM

## 2019-10-23 MED ORDER — ZOLPIDEM TARTRATE 10 MG PO TABS: 10.0000 mg | ORAL_TABLET | Freq: Every evening | ORAL | 0 refills | Status: DC | PRN

## 2019-10-23 MED ORDER — BUPROPION HCL ER (XL) 150 MG PO TB24: 150.0000 mg | ORAL_TABLET | Freq: Every day | ORAL | 1 refills | Status: DC

## 2019-10-23 NOTE — Progress Notes (Signed)
Provider Location : [facility] Patient Location : Home   Virtual Visit via Video Note  I connected with Johnny Villa on 10/23/19 at 11:00 AM EST by a video enabled telemedicine application and verified that I am speaking with the correct person using two identifiers.   I discussed the limitations of evaluation and management by telemedicine and the availability of in person appointments. The patient expressed understanding and agreed to proceed.    I discussed the assessment and treatment plan with the patient. The patient was provided an opportunity to ask questions and all were answered. The patient agreed with the plan and demonstrated an understanding of the instructions.   The patient was advised to call back or seek an in-person evaluation if the symptoms worsen or if the condition fails to improve as anticipated.   [place] MD OP Progress Note  10/23/2019 12:04 PM Teodoro Lota  MRN:  [id number]  Chief Complaint:  Chief Complaint    Follow-up     HPI: Johnny Villa is a 44 year old male, currently lives in [place], disabled, has a history of PTSD, bipolar disorder, panic attacks, alcohol use disorder, migraine headaches, cocaine use disorder in remission was evaluated by telemedicine today.  Patient today reports he is currently struggling with depressive symptoms.  He reports he feels sad, has lack of motivation, low energy and also have observed some excessive sleep during the day.  He continues to sleep good at night.  He denies any significant anxiety symptoms.  He reports he continues to stay away from alcohol and other drugs.  He reports he continues to attend AA meetings twice a day every day.  Denies any suicidality.  Patient denies any perceptual disturbances.  Patient denies any other concerns today. Visit Diagnosis:    ICD-10-CM   1. PTSD (post-traumatic stress disorder)  F43.10   2. Bipolar disorder, current episode depressed, moderate ([name])  F31.32 buPROPion  (WELLBUTRIN XL) 150 MG 24 hr tablet  3. Insomnia due to mental condition  F51.05 zolpidem (AMBIEN) 10 MG tablet  4. Alcohol use disorder, moderate, in early remission ([place])  F10.21   5. Cocaine use disorder, moderate, in sustained remission ([place])  F14.21     Past Psychiatric History: I have reviewed past psychiatric history from my progress note on 05/04/2019.  Past trials of Klonopin, Lexapro, Rexulti, Wellbutrin, lamotrigine, lithium, BuSpar, vraylar  Past Medical History:  Past Medical History:  Diagnosis Date  . Bipolar disorder [facility])    psychiatrist in [place]  . Blood in stool   . Depression   . Drug abuse in remission [facility])    sober for 6 years  . Fatty liver 01/2018  . H/O alcohol abuse   . Headache    migraines  . Hypertension   . Low testosterone in male     Past Surgical History:  Procedure Laterality Date  . APPENDECTOMY     2006  . COLONOSCOPY WITH PROPOFOL N/A 12/30/2017   Procedure: COLONOSCOPY WITH PROPOFOL;  Surgeon: Lin Landsman, MD;  Location: [facility] ENDOSCOPY;  Service: Gastroenterology;  Laterality: N/A;  . ESOPHAGOGASTRODUODENOSCOPY (EGD) WITH PROPOFOL N/A 12/30/2017   Procedure: ESOPHAGOGASTRODUODENOSCOPY (EGD) WITH PROPOFOL;  Surgeon: Lin Landsman, MD;  Location: [facility] ENDOSCOPY;  Service: Gastroenterology;  Laterality: N/A;    Family Psychiatric History: I have reviewed family psychiatric history from my progress note on 05/04/2019.  Family History:  Family History  Problem Relation Age of Onset  . Depression Mother   . Alcohol abuse Father   .  Cancer Father        prostate dx'ed 27  . COPD Father   . Hyperlipidemia Father   . Hypertension Father     Social History: Reviewed social history from my progress note on 05/04/2019. Social History   Socioeconomic History  . Marital status: Single    Spouse name: Not on file  . Number of children: Not on file  . Years of education: Not on file  . Highest education level: Not on file   Occupational History  . Occupation: disabled  Tobacco Use  . Smoking status: Former Smoker    Packs/day: 0.25    Years: 2.00    Pack years: 0.50    Types: Cigarettes    Quit date: 1999    Years since quitting: 22.0  . Smokeless tobacco: Former Systems developer    Types: Chew  Substance and Sexual Activity  . Alcohol use: Not Currently    Comment: former quit 03/14/2012  . Drug use: Not Currently    Comment: former quit cocaine 03/14/2012   . Sexual activity: Yes  Other Topics Concern  . 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 Difficulty of Paying Living Expenses: Not on file  Food Insecurity:   . Worried About Charity fundraiser in the Last Year: Not on file  . Ran Out of Food in the Last Year: Not on file  Transportation Needs:   . Lack of Transportation (Medical): Not on file  . Lack of Transportation (Non-Medical): Not on file  Physical Activity:   . Days of Exercise per Week: Not on file  . Minutes of Exercise per Session: Not on file  Stress:   . Feeling of Stress : Not on file  Social Connections:   . Frequency of Communication with Friends and Family: Not on file  . Frequency of Social Gatherings with Friends and Family: Not on file  . Attends Religious Services: Not on file  . Active Member of Clubs or Organizations: Not on file  . Attends Archivist Meetings: Not on file  . Marital Status: Not on file    Allergies: No Known Allergies  Metabolic Disorder Labs: Lab Results  Component Value Date   HGBA1C 5.6 09/13/2019   MPG 114.02 09/13/2019   No results found for: PROLACTIN Lab Results  Component Value Date   CHOL 194 08/10/2019   TRIG 60.0 08/10/2019   HDL 56.20 08/10/2019   CHOLHDL 3 08/10/2019   VLDL 12.0 08/10/2019   LDLCALC 126 (H) 08/10/2019   LDLCALC 114 (H) 10/25/2017   Lab  Results  Component Value Date   TSH 1.76 08/10/2019   TSH 0.97 06/23/2018    Therapeutic Level Labs: Lab Results  Component Value Date   LITHIUM 0.4 (L) 06/23/2018   LITHIUM 0.8 10/25/2017   No results found for: VALPROATE No components found for:  CBMZ  Current Medications: Current Outpatient Medications  Medication Sig Dispense Refill  . buPROPion (WELLBUTRIN XL) 150 MG 24 hr tablet Take 1 tablet (150 mg total) by mouth daily. 30 tablet 1  . busPIRone (BUSPAR) 30 MG tablet Take 1 tablet (30 mg total) by mouth 2 (two) times daily. 180 tablet 1  . clomiPHENE (CLOMID) 50 MG tablet Take by  mouth.    . escitalopram (LEXAPRO) 10 MG tablet Take 1 tablet (10 mg total) by mouth daily. 90 tablet 1  . hydrOXYzine (ATARAX/VISTARIL) 25 MG tablet Take 1 tablet (25 mg total) by mouth every 6 (six) hours as needed for anxiety. 90 tablet 1  . lamoTRIgine (LAMICTAL) 200 MG tablet Take 1 tablet (200 mg total) by mouth daily. 90 tablet 1  . montelukast (SINGULAIR) 10 MG tablet Take 1 tablet (10 mg total) by mouth daily. 90 tablet 3  . ondansetron (ZOFRAN-ODT) 4 MG disintegrating tablet     . SUMAtriptan (IMITREX) 100 MG tablet     . zolpidem (AMBIEN) 10 MG tablet Take 1 tablet (10 mg total) by mouth at bedtime as needed for sleep. 90 tablet 0   No current facility-administered medications for this visit.     Musculoskeletal: Strength & Muscle Tone: UTA Gait & Station: normal Patient leans: N/A  Psychiatric Specialty Exam: Review of Systems  Psychiatric/Behavioral: Positive for dysphoric mood.  All other systems reviewed and are negative.   There were no vitals taken for this visit.There is no height or weight on file to calculate BMI.  General Appearance: Casual  Eye Contact:  Fair  Speech:  Clear and Coherent  Volume:  Normal  Mood:  Depressed  Affect:  Congruent  Thought Process:  Goal Directed and Descriptions of Associations: Intact  Orientation:  Full (Time, Place, and Person)   Thought Content: Logical   Suicidal Thoughts:  No  Homicidal Thoughts:  No  Memory:  Immediate;   Fair Recent;   Fair Remote;   Fair  Judgement:  Fair  Insight:  Fair  Psychomotor Activity:  Normal  Concentration:  Concentration: Fair and Attention Span: Fair  Recall:  [employer] of Knowledge: Fair  Language: Fair  Akathisia:  No  Handed:  Right  AIMS (if indicated): Denies tremors, rigidity  Assets:  Communication Skills Desire for Improvement Housing Social Support  ADL's:  Intact  Cognition: WNL  Sleep:  Fair   Screenings: AIMS     Admission (Discharged) from 09/09/2019 in [place] Total Score  0    AUDIT     Admission (Discharged) from 09/09/2019 in [facility] Admission (Discharged) from 06/01/2019 in [place]  Alcohol Use Disorder Identification Test Final Score (AUDIT)  40  33    PHQ2-9     Office Visit from 07/09/2019 in [facility] from 06/26/2019 in [facility] Office Visit from 06/24/2018 in [facility] Office Visit from 06/19/2018 in [facility] Procedure visit from 05/19/2018 in [facility]  PHQ-2 Total Score  2  0  0  0  0  PHQ-9 Total Score  4  --  --  --  --       Assessment and Plan: Johnny Villa is a 44 year old male, divorced, lives in [place], has a history of PTSD, panic attacks, bipolar disorder, alcohol and cocaine use disorder, migraine headaches was evaluated by telemedicine today.  Patient is biologically predisposed given his history of substance abuse problems in the past as well as history of trauma.  Is currently struggling with depressive symptoms and will benefit from medication readjustment.  Patient continues to attend AA meetings and will continue to benefit from the support.  Plan as noted  below.  PTSD-stable Lexapro 10 mg p.o. daily Ambien 5 mg-10 mg  p.o. nightly Patient advised to limit the use of Ambien. Advised to use melatonin over-the-counter.  Bipolar disorder-current episode depressed Lamictal 200 mg p.o. daily Start Wellbutrin XL 150 mg p.o. daily.  Patient reports previous good benefit from Wellbutrin.  Will monitor him closely.  Alcohol use disorder in early remission Patient recently relapsed however currently continues to be sober. He continues to attend AA meetings daily. Patient declines individual psychotherapy referral.  Cocaine use disorder in remission Patient continues to remain sober.  Follow-up in clinic in 2 weeks or sooner if needed.  February 3 at 4:15 pm  I have spent atleast 20 minutes non face to face with patient today. More than 50 % of the time was spent for  ordering medications and test ,psychoeducation and supportive psychotherapy and care coordination,as well as documenting clinical information in electronic health record. This note was generated in part or whole with voice recognition software. Voice recognition is usually quite accurate but there are transcription errors that can and very often do occur. I apologize for any typographical errors that were not detected and corrected.        Ursula Alert, MD 10/23/2019, 12:04 PM

## 2019-10-26 ENCOUNTER — Encounter: Payer: Self-pay | Admitting: Internal Medicine

## 2019-10-27 IMAGING — DX DG HIP (WITH OR WITHOUT PELVIS) 2-3V*R*
3 series · 3 of 3 positions shown · non-contrast
Comparison: None

CLINICAL DATA: Chronic RIGHT hip and knee pain for several years,
no known injury or surgery

EXAM:
DG HIP (WITH OR WITHOUT PELVIS) 2-3V RIGHT

[pelvis ap]
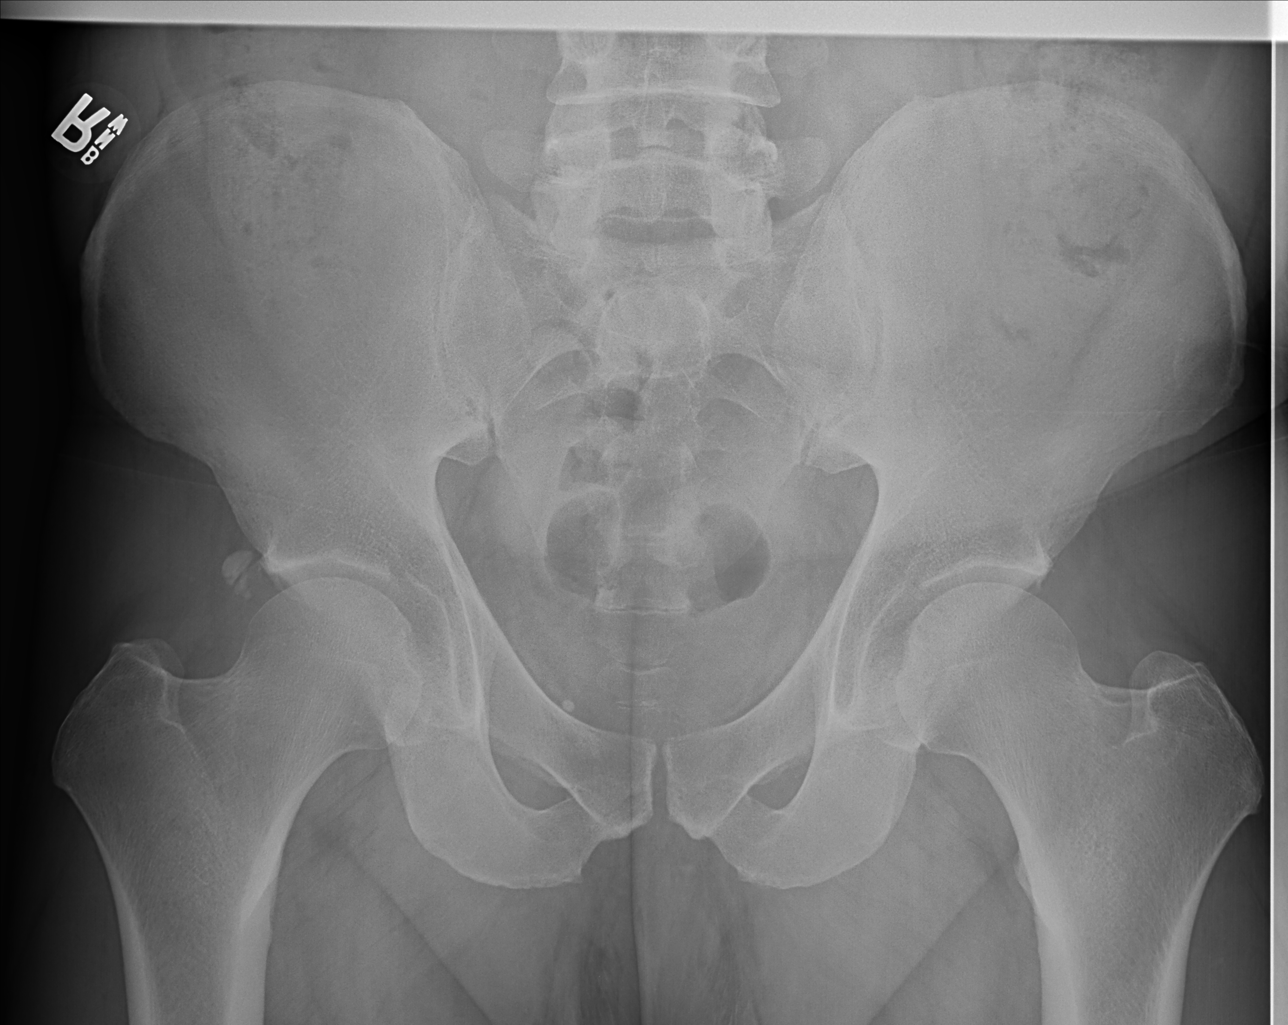

[hip joint ap]
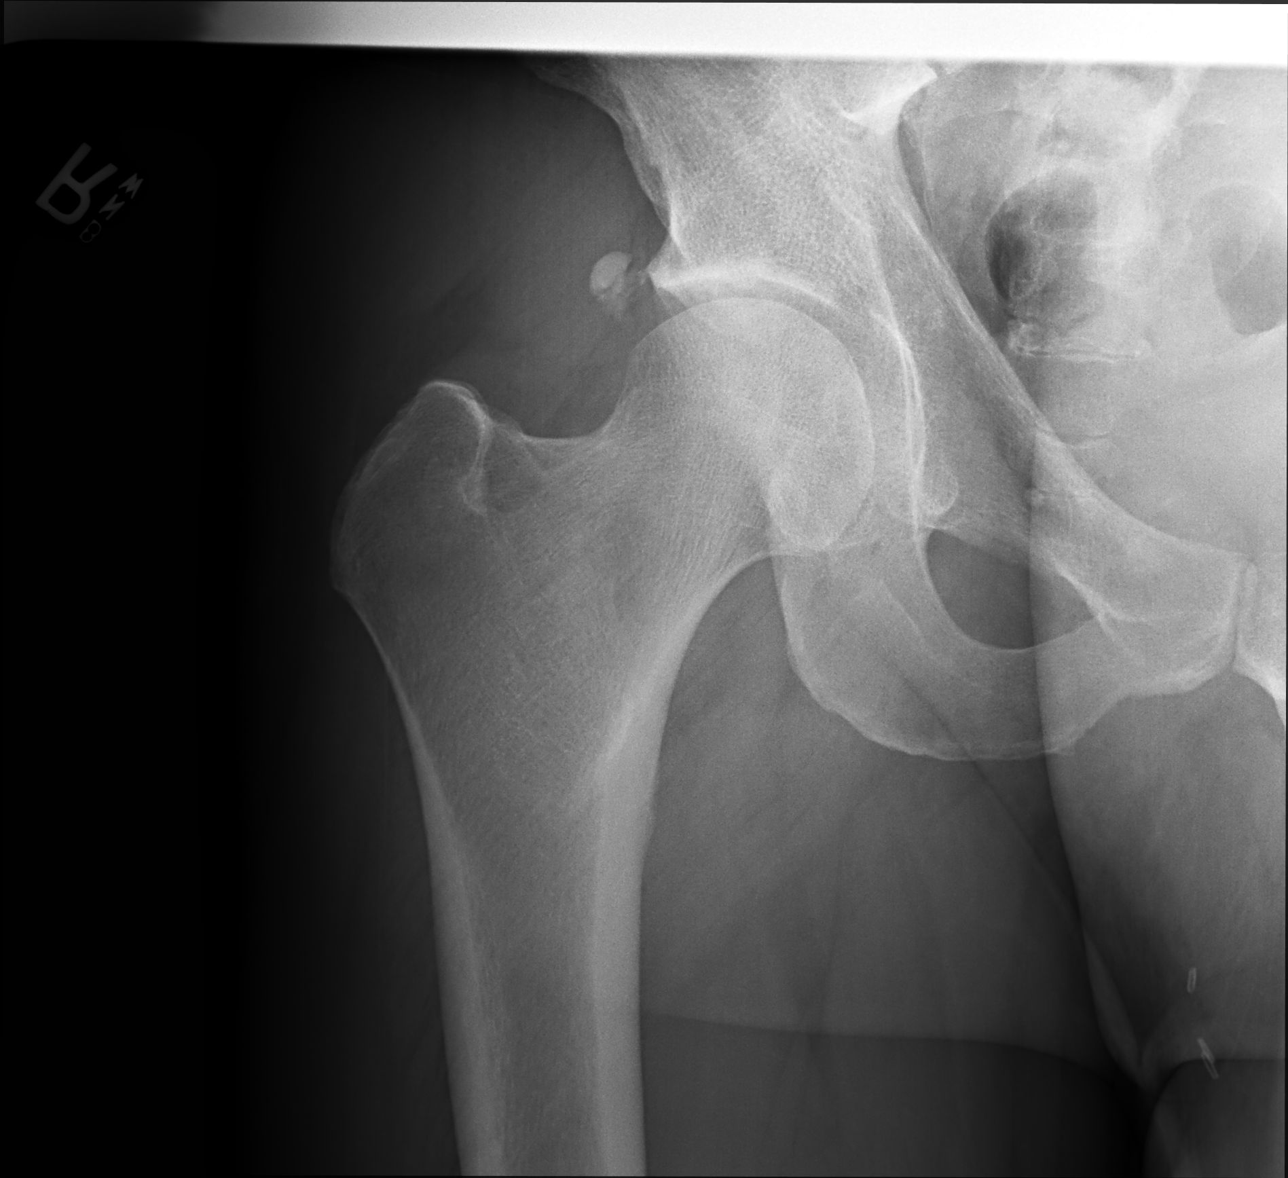

[ap frog obl (oblique)]
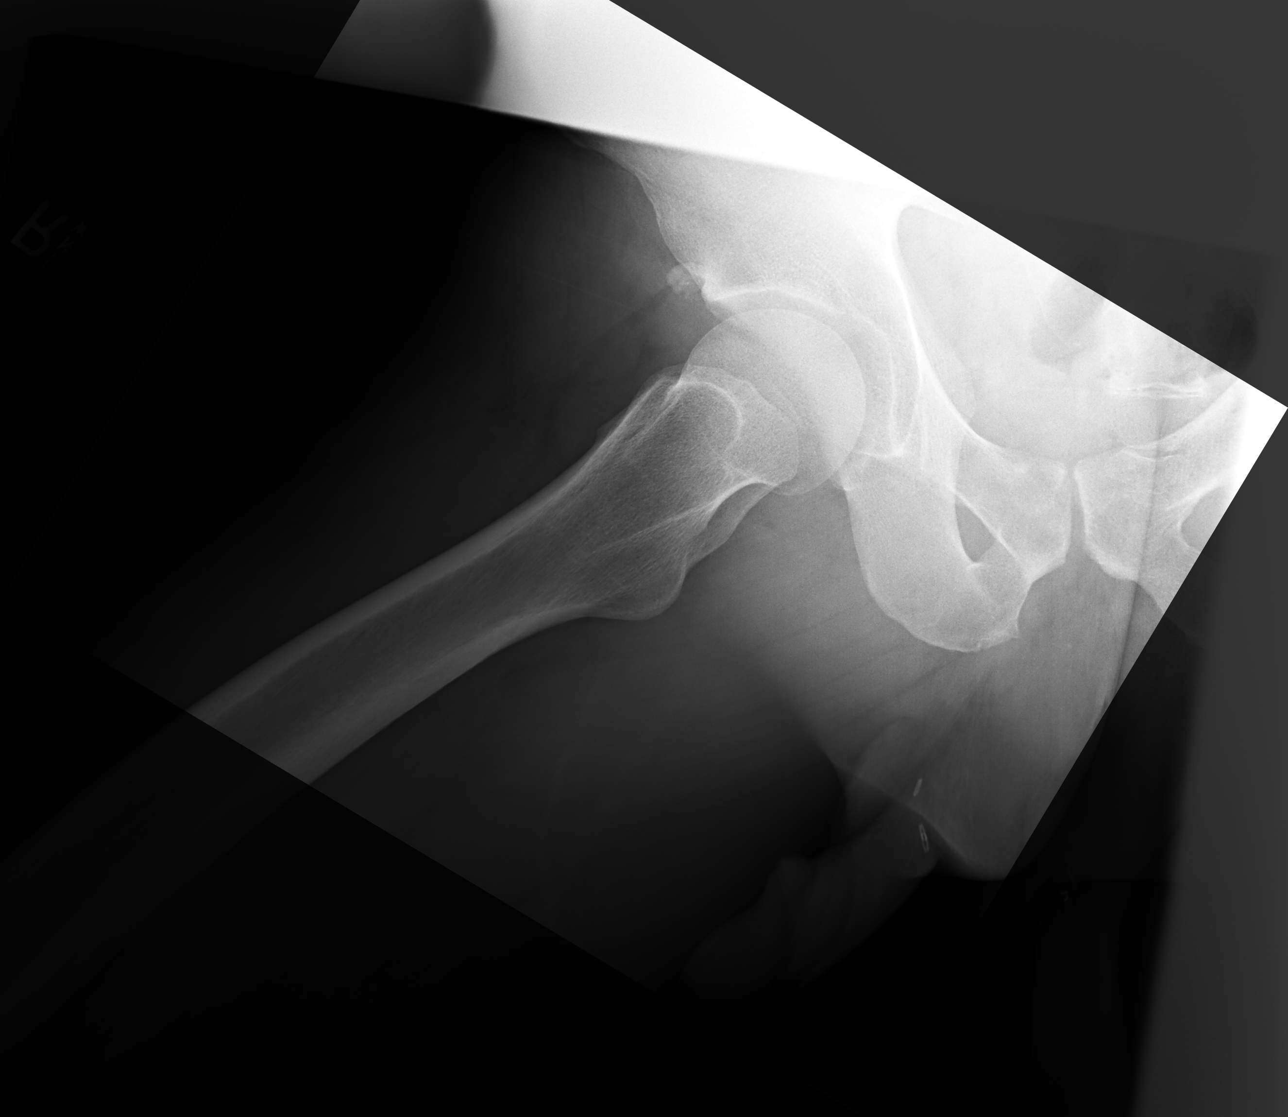

[3 of 3 positions shown; findings below may reference images not displayed]

FINDINGS: Osseous mineralization normal.

Hip and SI joint spaces preserved.

No acute fracture, dislocation, or bone destruction.

Small old non fused ossicle adjacent to the lateral margin of the
acetabulum.
IMPRESSION: No acute osseous abnormalities.

## 2019-10-27 IMAGING — DX DG KNEE COMPLETE 4+V*R*
4 series · 4 of 4 positions shown · non-contrast
Comparison: None

CLINICAL DATA: Chronic RIGHT hip and knee pain for several years,
no known injury or surgery

EXAM:
RIGHT KNEE - COMPLETE 4+ VIEW

[knee standing ap]
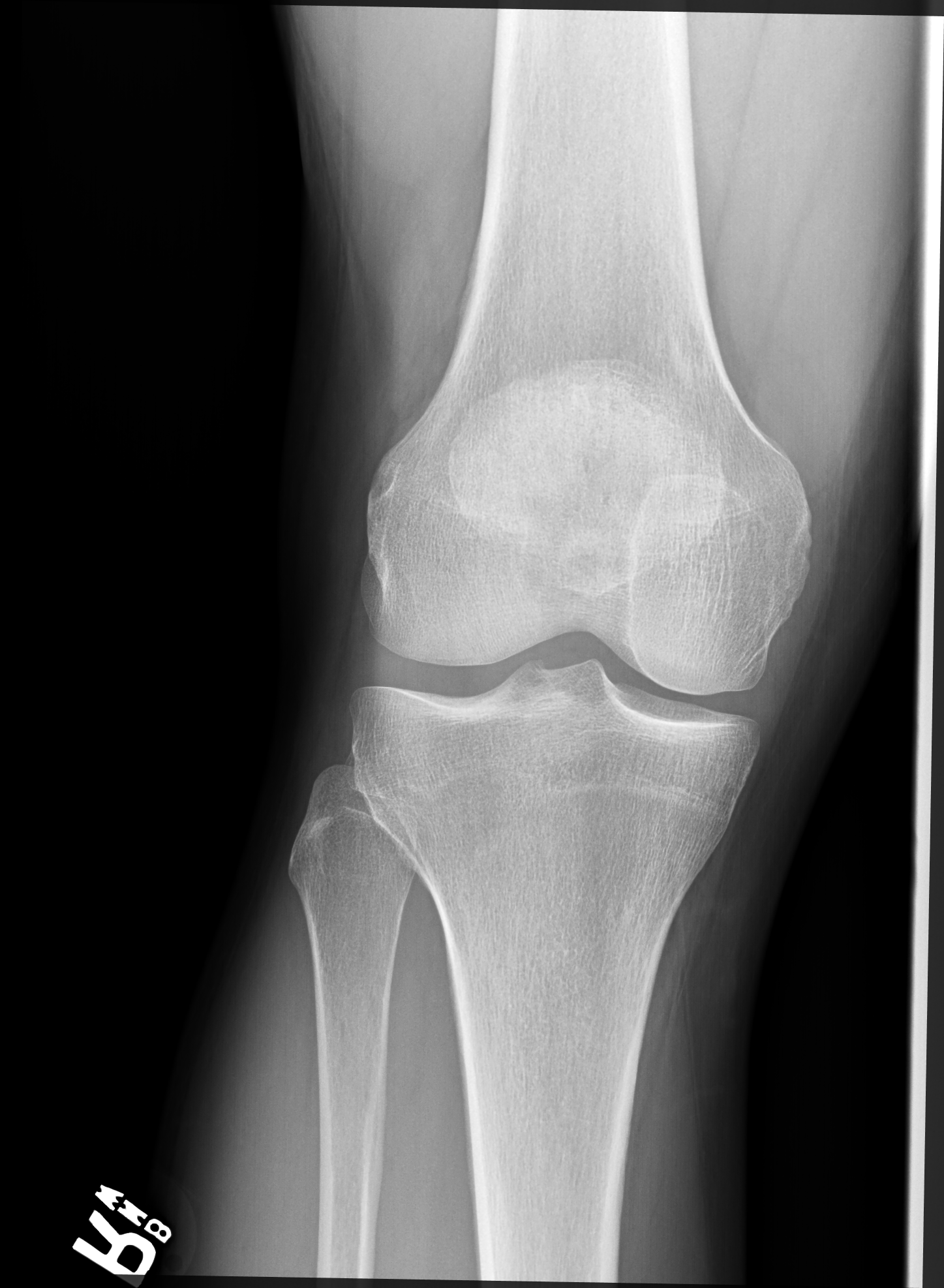

[knee standing external ap]
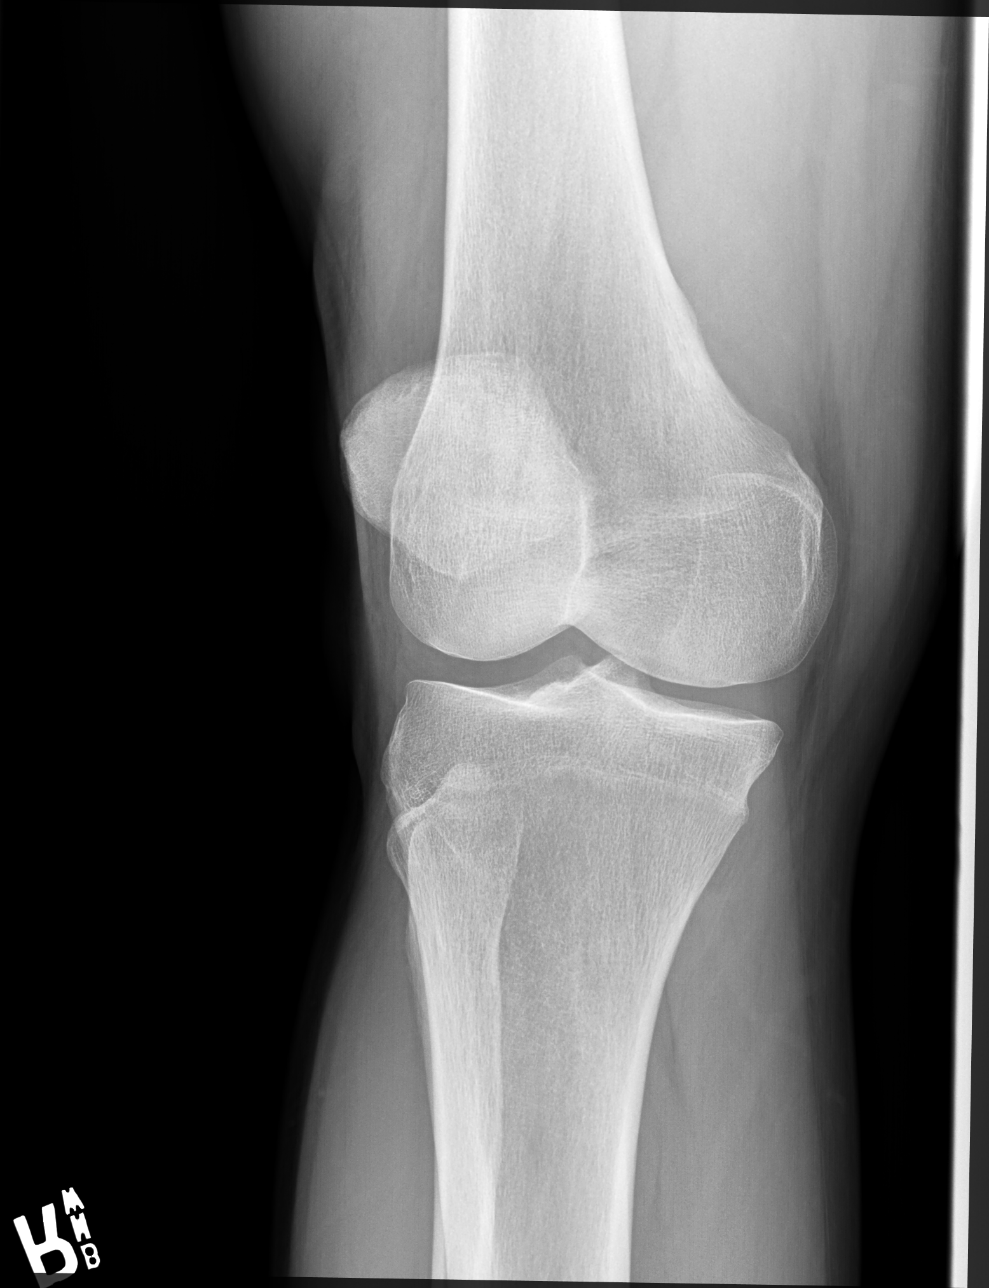

[knee standing internal ap]
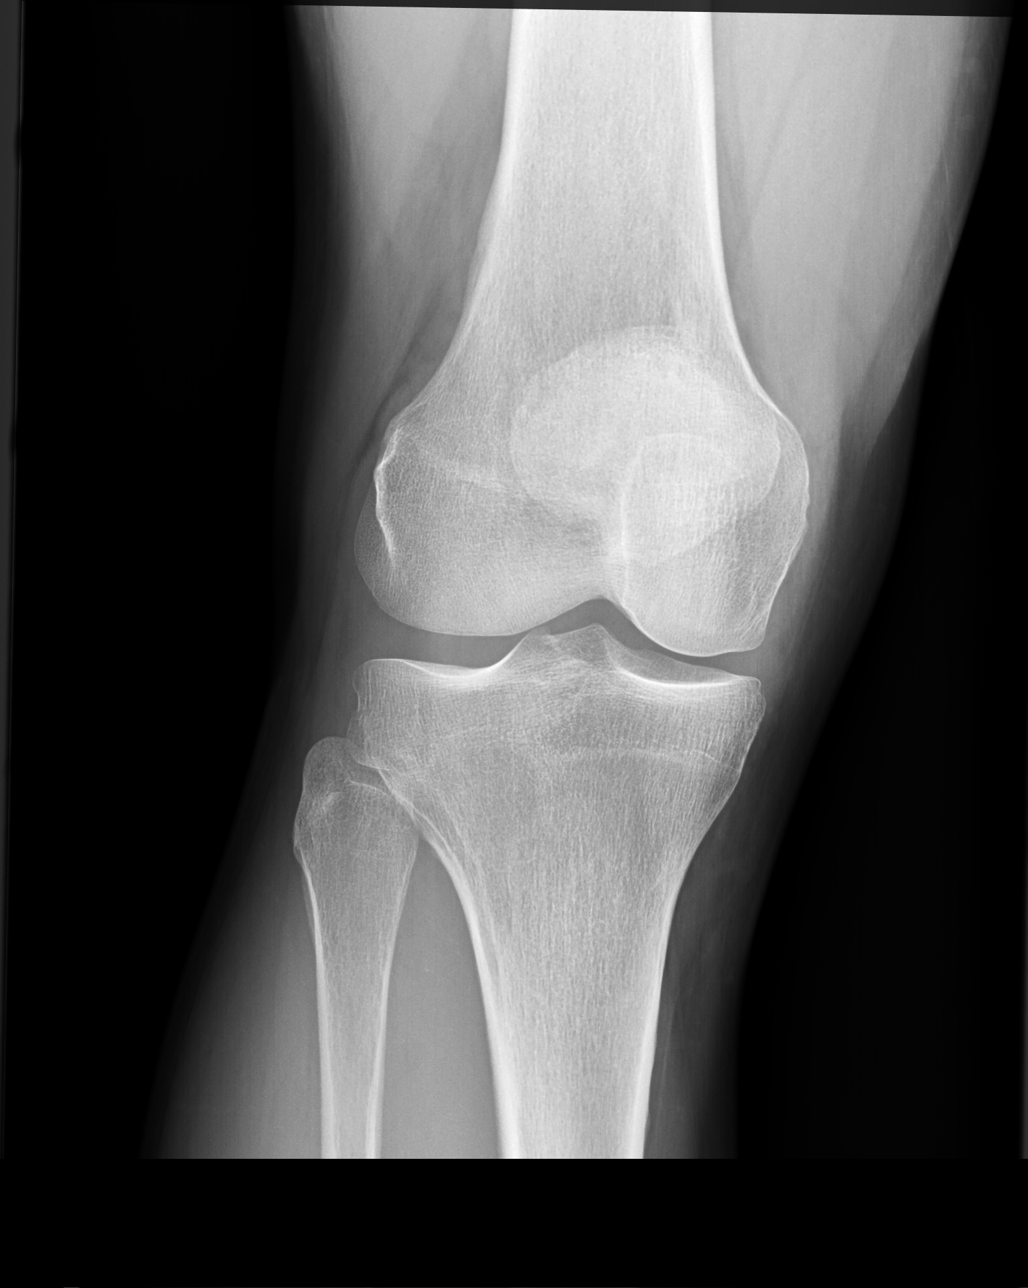

[knee standing lat]
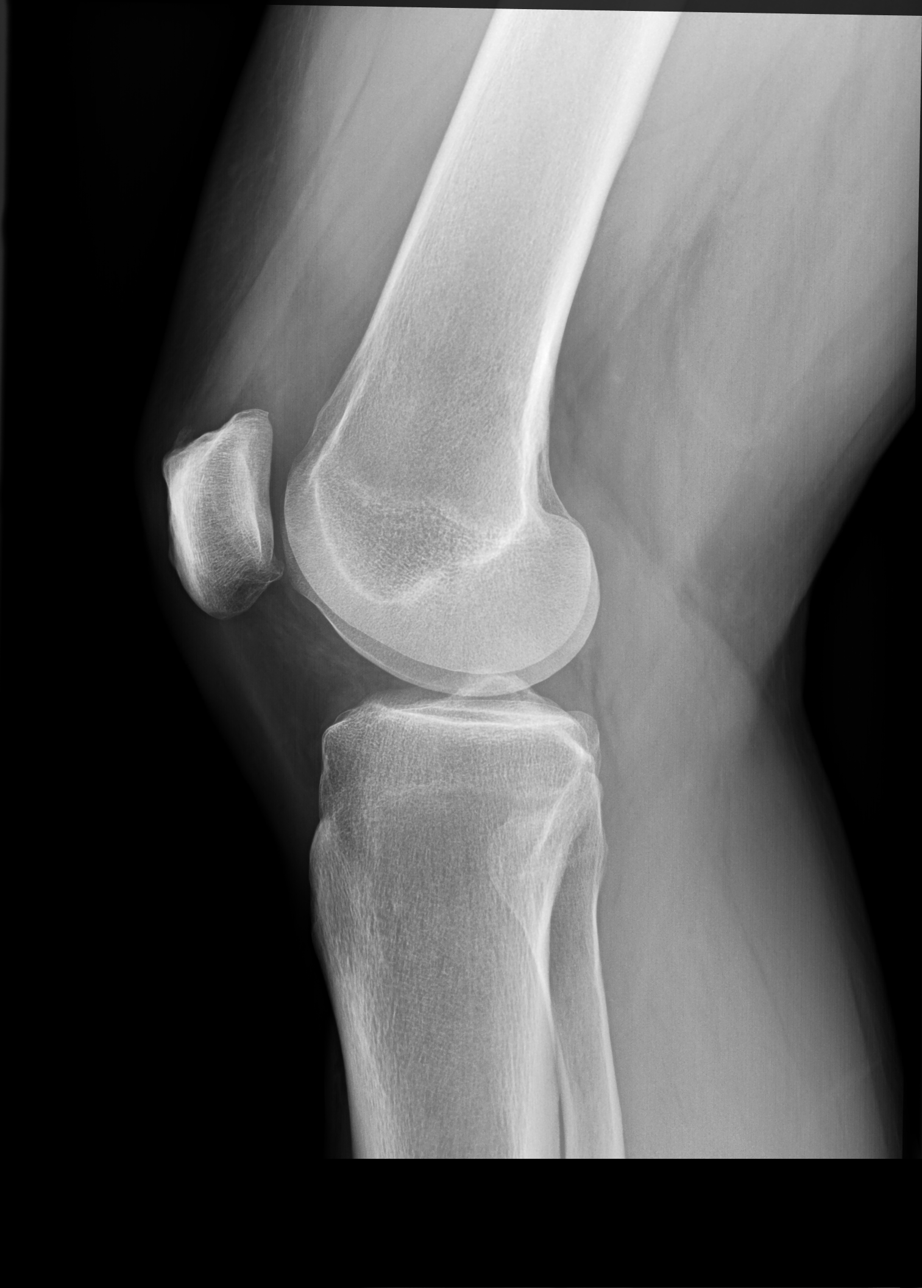

[4 of 4 positions shown; findings below may reference images not displayed]

FINDINGS: Osseous mineralization normal.

Joint spaces preserved.

No fracture, dislocation, or bone destruction.

No joint effusion.
IMPRESSION: Normal exam.

## 2019-10-28 ENCOUNTER — Telehealth: Payer: Self-pay | Admitting: Psychiatry

## 2019-10-28 NOTE — Telephone Encounter (Signed)
Returned call to patient regarding the request for medical consultation received from [facility]. We will complete the form per patient request.  Janett Billow CMA to fax an email to follow per patient request.

## 2019-11-02 DIAGNOSIS — Z20828 Contact with and (suspected) exposure to other viral communicable diseases: Secondary | ICD-10-CM | POA: Diagnosis not present

## 2019-11-04 ENCOUNTER — Encounter: Payer: Self-pay | Admitting: Psychiatry

## 2019-11-04 ENCOUNTER — Other Ambulatory Visit: Payer: Self-pay

## 2019-11-04 ENCOUNTER — Ambulatory Visit (INDEPENDENT_AMBULATORY_CARE_PROVIDER_SITE_OTHER): Payer: Medicare PPO | Admitting: Psychiatry

## 2019-11-04 DIAGNOSIS — F5105 Insomnia due to other mental disorder: Secondary | ICD-10-CM

## 2019-11-04 DIAGNOSIS — F4310 Post-traumatic stress disorder, unspecified: Secondary | ICD-10-CM

## 2019-11-04 DIAGNOSIS — F1021 Alcohol dependence, in remission: Secondary | ICD-10-CM

## 2019-11-04 DIAGNOSIS — F3132 Bipolar disorder, current episode depressed, moderate: Secondary | ICD-10-CM

## 2019-11-04 DIAGNOSIS — F1421 Cocaine dependence, in remission: Secondary | ICD-10-CM | POA: Diagnosis not present

## 2019-11-04 MED ORDER — BUPROPION HCL ER (XL) 300 MG PO TB24: 300.0000 mg | ORAL_TABLET | Freq: Every day | ORAL | 0 refills | Status: DC

## 2019-11-04 NOTE — Progress Notes (Signed)
Provider Location : [facility] Patient Location : Home   Virtual Visit via Video Note  I connected with Johnny Villa on 11/04/19 at  4:15 PM EST by a video enabled telemedicine application and verified that I am speaking with the correct person using two identifiers.   I discussed the limitations of evaluation and management by telemedicine and the availability of in person appointments. The patient expressed understanding and agreed to proceed.    I discussed the assessment and treatment plan with the patient. The patient was provided an opportunity to ask questions and all were answered. The patient agreed with the plan and demonstrated an understanding of the instructions.   The patient was advised to call back or seek an in-person evaluation if the symptoms worsen or if the condition fails to improve as anticipated.   [place] MD OP Progress Note  11/04/2019 5:25 PM Johnny Villa  MRN:  [id number]  Chief Complaint:  Chief Complaint    Follow-up     HPI: Johnny Villa is a 44 year old male, currently lives in [place], disabled, has a history of PTSD, bipolar disorder, panic attacks, alcohol use disorder, migraine headaches, cocaine use disorder in remission was evaluated by telemedicine today.  Patient reports he continues to struggle with depressive symptoms.  He feels sad and has low energy and lack of motivation.  He reports he is compliant on the Wellbutrin as prescribed.  He reports that has not made any difference yet and he only has been taking it since the past 10 days.  He currently denies any side effects.  He reports he used to be on Wellbutrin 300 mg in the past which helped him.  He is interested in dosage increase today.  He reports sleep is good.  He reports his appetite may be a little bit reduced however he has been pushing himself to eat.  He continues to have good support system from his AA meetings as well as a sponsor.  Patient denies any suicidality, homicidality or  perceptual disturbances.  Patient continues to stay away from alcohol and other illicit drugs.  He denies any other concerns today.  Visit Diagnosis:    ICD-10-CM   1. PTSD (post-traumatic stress disorder)  F43.10   2. Bipolar disorder, current episode depressed, moderate ([name])  F31.32 buPROPion (WELLBUTRIN XL) 300 MG 24 hr tablet  3. Insomnia due to mental condition  F51.05   4. Alcohol use disorder, moderate, in early remission ([place])  F10.21   5. Cocaine use disorder, moderate, in sustained remission ([place])  F14.21     Past Psychiatric History: I have reviewed past psychiatric history from my progress note on 05/04/2019.  Past trials of Klonopin, Lexapro, Rexulti, Wellbutrin, lamotrigine, lithium, BuSpar, vraylar  Past Medical History:  Past Medical History:  Diagnosis Date  . Bipolar disorder [facility])    psychiatrist in [place]  . Blood in stool   . Depression   . Drug abuse in remission [facility])    sober for 6 years  . Fatty liver 01/2018  . H/O alcohol abuse   . Headache    migraines  . Hypertension   . Low testosterone in male     Past Surgical History:  Procedure Laterality Date  . APPENDECTOMY     2006  . COLONOSCOPY WITH PROPOFOL N/A 12/30/2017   Procedure: COLONOSCOPY WITH PROPOFOL;  Surgeon: Lin Landsman, MD;  Location: [facility] ENDOSCOPY;  Service: Gastroenterology;  Laterality: N/A;  . ESOPHAGOGASTRODUODENOSCOPY (EGD) WITH PROPOFOL N/A 12/30/2017   Procedure:  ESOPHAGOGASTRODUODENOSCOPY (EGD) WITH PROPOFOL;  Surgeon: Lin Landsman, MD;  Location: [facility] ENDOSCOPY;  Service: Gastroenterology;  Laterality: N/A;    Family Psychiatric History: I have reviewed family psychiatric history from my progress note on 05/04/2019.  Family History:  Family History  Problem Relation Age of Onset  . Depression Mother   . Alcohol abuse Father   . Cancer Father        prostate dx'ed 68  . COPD Father   . Hyperlipidemia Father   . Hypertension Father     Social History:  Reviewed social history from my progress note on 05/04/2019. Social History   Socioeconomic History  . Marital status: Single    Spouse name: Not on file  . Number of children: Not on file  . Years of education: Not on file  . Highest education level: Not on file  Occupational History  . Occupation: disabled  Tobacco Use  . Smoking status: Former Smoker    Packs/day: 0.25    Years: 2.00    Pack years: 0.50    Types: Cigarettes    Quit date: 1999    Years since quitting: 22.1  . Smokeless tobacco: Former Systems developer    Types: Chew  Substance and Sexual Activity  . Alcohol use: Not Currently    Comment: former quit 03/14/2012  . Drug use: Not Currently    Comment: former quit cocaine 03/14/2012   . Sexual activity: Yes  Other Topics Concern  . 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 Difficulty of Paying Living Expenses: Not on file  Food Insecurity:   . Worried About Charity fundraiser in the Last Year: Not on file  . Ran Out of Food in the Last Year: Not on file  Transportation Needs:   . Lack of Transportation (Medical): Not on file  . Lack of Transportation (Non-Medical): Not on file  Physical Activity:   . Days of Exercise per Week: Not on file  . Minutes of Exercise per Session: Not on file  Stress:   . Feeling of Stress : Not on file  Social Connections:   . Frequency of Communication with Friends and Family: Not on file  . Frequency of Social Gatherings with Friends and Family: Not on file  . Attends Religious Services: Not on file  . Active Member of Clubs or Organizations: Not on file  . Attends Archivist Meetings: Not on file  . Marital Status: Not on file    Allergies: No Known Allergies  Metabolic Disorder Labs: Lab Results  Component Value Date   HGBA1C 5.6 09/13/2019   MPG 114.02  09/13/2019   No results found for: PROLACTIN Lab Results  Component Value Date   CHOL 194 08/10/2019   TRIG 60.0 08/10/2019   HDL 56.20 08/10/2019   CHOLHDL 3 08/10/2019   VLDL 12.0 08/10/2019   LDLCALC 126 (H) 08/10/2019   LDLCALC 114 (H) 10/25/2017   Lab Results  Component Value Date   TSH 1.76 08/10/2019   TSH 0.97 06/23/2018    Therapeutic Level Labs: Lab Results  Component Value Date   LITHIUM 0.4 (L) 06/23/2018   LITHIUM 0.8 10/25/2017   No results found for: VALPROATE No components found for:  CBMZ  Current Medications: Current Outpatient Medications  Medication Sig Dispense Refill  . buPROPion (WELLBUTRIN XL) 300 MG 24 hr tablet Take 1 tablet (300 mg total) by mouth daily. 90 tablet 0  . busPIRone (BUSPAR) 30 MG tablet Take 1 tablet (30 mg total) by mouth 2 (two) times daily. 180 tablet 1  . clomiPHENE (CLOMID) 50 MG tablet Take by mouth.    . escitalopram (LEXAPRO) 10 MG tablet Take 1 tablet (10 mg total) by mouth daily. 90 tablet 1  . hydrOXYzine (ATARAX/VISTARIL) 25 MG tablet Take 1 tablet (25 mg total) by mouth every 6 (six) hours as needed for anxiety. 90 tablet 1  . lamoTRIgine (LAMICTAL) 200 MG tablet Take 1 tablet (200 mg total) by mouth daily. 90 tablet 1  . montelukast (SINGULAIR) 10 MG tablet Take 1 tablet (10 mg total) by mouth daily. 90 tablet 3  . ondansetron (ZOFRAN-ODT) 4 MG disintegrating tablet     . SUMAtriptan (IMITREX) 100 MG tablet     . zolpidem (AMBIEN) 10 MG tablet Take 1 tablet (10 mg total) by mouth at bedtime as needed for sleep. 90 tablet 0   No current facility-administered medications for this visit.     Musculoskeletal: Strength & Muscle Tone: UTA Gait & Station: normal Patient leans: N/A  Psychiatric Specialty Exam: Review of Systems  Psychiatric/Behavioral: Positive for dysphoric mood.  All other systems reviewed and are negative.   There were no vitals taken for this visit.There is no height or weight on file to  calculate BMI.  General Appearance: Casual  Eye Contact:  Fair  Speech:  Clear and Coherent  Volume:  Normal  Mood:  Depressed  Affect:  Congruent  Thought Process:  Goal Directed and Descriptions of Associations: Intact  Orientation:  Full (Time, Place, and Person)  Thought Content: Logical   Suicidal Thoughts:  No  Homicidal Thoughts:  No  Memory:  Immediate;   Fair Recent;   Fair Remote;   Fair  Judgement:  Fair  Insight:  Fair  Psychomotor Activity:  Normal  Concentration:  Concentration: Fair and Attention Span: Fair  Recall:  [employer] of Knowledge: Fair  Language: Fair  Akathisia:  No  Handed:  Right  AIMS (if indicated): Denies tremors, rigidity  Assets:  Communication Skills Desire for Improvement Housing Social Support  ADL's:  Intact  Cognition: WNL  Sleep:  Fair   Screenings: AIMS     Admission (Discharged) from 09/09/2019 in [place] Total Score  0    AUDIT     Admission (Discharged) from 09/09/2019 in [place] Admission (Discharged) from 06/01/2019 in [place]  Alcohol Use Disorder Identification Test Final Score (AUDIT)  40  33    PHQ2-9     Office Visit from 07/09/2019 in [place] from 06/26/2019 in [facility] Office Visit from 06/24/2018 in [place] Office Visit from 06/19/2018 in [place] Procedure visit from 05/19/2018 in [place]  PHQ-2 Total Score  2  0  0  0  0  PHQ-9 Total Score  4  --  --  --  --       Assessment and Plan: Johnny Villa is a 44 year old male, divorced, lives in [place], has a history of PTSD, panic attacks, bipolar disorder, alcohol and cocaine use disorder, migraine headaches was evaluated by telemedicine today.  Patient is  biologically predisposed given his history of  substance abuse problems in the past as well as history of trauma.  Patient continues to struggle with depressive symptoms.  Plan as noted below.  Plan PTSD-stable Lexapro 10 mg p.o. daily Ambien 5 mg to 10 mg p.o. nightly Patient advised to limit use of Ambien. Advised to use melatonin over-the-counter  Bipolar disorder current episode depressed-unstable Lamictal 200 mg p.o. daily Increase Wellbutrin XL to 300 mg p.o. daily.  Patient reports he was on 300 mg previously which helped him.  Alcohol use disorder in early remission Patient continues to attend AA meetings daily.  Cocaine use disorder in remission Plan patient continues to remain sober  Follow-up in clinic in 3 weeks or sooner if needed.  February 23 at 11 AM  I have spent atleast 20 minutes non  face to face with patient today. More than 50 % of the time was spent for  ordering medications and test ,psychoeducation and supportive psychotherapy and care coordination,as well as documenting clinical information in electronic health record. This note was generated in part or whole with voice recognition software. Voice recognition is usually quite accurate but there are transcription errors that can and very often do occur. I apologize for any typographical errors that were not detected and corrected.       Ursula Alert, MD 11/04/2019, 5:25 PM

## 2019-11-09 ENCOUNTER — Telehealth: Payer: Self-pay

## 2019-11-09 ENCOUNTER — Telehealth (HOSPITAL_COMMUNITY): Payer: Self-pay

## 2019-11-09 DIAGNOSIS — F3132 Bipolar disorder, current episode depressed, moderate: Secondary | ICD-10-CM

## 2019-11-09 DIAGNOSIS — F4310 Post-traumatic stress disorder, unspecified: Secondary | ICD-10-CM

## 2019-11-09 MED ORDER — BUPROPION HCL ER (XL) 300 MG PO TB24: 300.0000 mg | ORAL_TABLET | Freq: Every day | ORAL | 0 refills | Status: DC

## 2019-11-09 NOTE — Telephone Encounter (Signed)
Patient had called regarding his Bupropion 300mg . Bupropion 300mg  #90 was sent to [facility]. Patient requested it be sent to [facility] on [address] in [place] instead. Thank you.

## 2019-11-09 NOTE — Telephone Encounter (Signed)
Returned call to patient.  He reports [employer] is waiting for his payment for bupropion 300 mg and it is expensive for him to get it through [facility].  He wants it sent to [facility].  We will send the medication to [facility].

## 2019-11-09 NOTE — Telephone Encounter (Signed)
Medication management - Fax received from [facility] that patient's prescribed Chlordiazepoxide was approved by his insurance.

## 2019-11-09 NOTE — Telephone Encounter (Signed)
Medication management - Notice from [facility] that patient's Ambien 5 mg tablets were approved.

## 2019-11-16 ENCOUNTER — Telehealth: Payer: Self-pay

## 2019-11-16 NOTE — Telephone Encounter (Signed)
received a fax that chlordiazepoxide 25mg  was approved until 11-05-19

## 2019-11-17 NOTE — Telephone Encounter (Signed)
Called patient to discuss that writer never prescribed him chlordiazepoxide.  Discussed with patient not to fill this medication which was recently approved by [facility] per the message I received.  Patient reports it may have been prescribed while he went to an emergency department.  He has never picked up this medication and is not currently on it.

## 2019-11-24 ENCOUNTER — Ambulatory Visit (INDEPENDENT_AMBULATORY_CARE_PROVIDER_SITE_OTHER): Payer: Medicare PPO | Admitting: Psychiatry

## 2019-11-24 ENCOUNTER — Other Ambulatory Visit: Payer: Self-pay

## 2019-11-24 ENCOUNTER — Encounter: Payer: Self-pay | Admitting: Psychiatry

## 2019-11-24 DIAGNOSIS — F3132 Bipolar disorder, current episode depressed, moderate: Secondary | ICD-10-CM | POA: Diagnosis not present

## 2019-11-24 DIAGNOSIS — F1021 Alcohol dependence, in remission: Secondary | ICD-10-CM

## 2019-11-24 DIAGNOSIS — F5105 Insomnia due to other mental disorder: Secondary | ICD-10-CM | POA: Diagnosis not present

## 2019-11-24 DIAGNOSIS — F4310 Post-traumatic stress disorder, unspecified: Secondary | ICD-10-CM

## 2019-11-24 DIAGNOSIS — F1421 Cocaine dependence, in remission: Secondary | ICD-10-CM | POA: Diagnosis not present

## 2019-11-24 NOTE — Progress Notes (Signed)
Provider Location : [facility] Patient Location : Home  Virtual Visit via Video Note  I connected with Johnny Villa on 11/24/19 at 11:00 AM EST by a video enabled telemedicine application and verified that I am speaking with the correct person using two identifiers.   I discussed the limitations of evaluation and management by telemedicine and the availability of in person appointments. The patient expressed understanding and agreed to proceed.   I discussed the assessment and treatment plan with the patient. The patient was provided an opportunity to ask questions and all were answered. The patient agreed with the plan and demonstrated an understanding of the instructions.   The patient was advised to call back or seek an in-person evaluation if the symptoms worsen or if the condition fails to improve as anticipated.   [place] MD OP Progress Note  11/24/2019 12:39 PM Johnny Villa  MRN:  [id number]  Chief Complaint:  Chief Complaint    Follow-up     HPI: Johnny Villa is a 44 year old male, currently lives in [place], disabled, has a history of PTSD, bipolar disorder, panic attacks, alcohol use disorder, migraine headaches, cocaine use disorder in remission was evaluated by telemedicine today.  Patient today reports he continues to feel depressed however he has noticed improvement since being on the Wellbutrin.  He denies any side effects.  His motivation and energy has improved since starting the medication.  He denies any manic or hypomanic symptoms and reports Lamictal is definitely helpful with the same.  He reports sleep and appetite is fair.  Patient denies any suicidality, homicidality or perceptual disturbances.  He continues to attend AA meetings and reports he continues to stay sober from alcohol.  He denies any other concerns today. Visit Diagnosis:    ICD-10-CM   1. PTSD (post-traumatic stress disorder)  F43.10   2. Bipolar disorder, current episode depressed, moderate  ([name])  F31.32    improving  3. Insomnia due to mental condition  F51.05   4. Alcohol use disorder, moderate, in early remission ([place])  F10.21   5. Cocaine use disorder, moderate, in sustained remission ([place])  F14.21     Past Psychiatric History: I have reviewed past psychiatric history from my progress note on 05/04/2019.  Past trials of Klonopin, Lexapro, Rexulti, Wellbutrin, lamotrigine, lithium, BuSpar, Vraylar  Past Medical History:  Past Medical History:  Diagnosis Date  . Bipolar disorder [facility])    psychiatrist in [place]  . Blood in stool   . Depression   . Drug abuse in remission [facility])    sober for 6 years  . Fatty liver 01/2018  . H/O alcohol abuse   . Headache    migraines  . Hypertension   . Low testosterone in male     Past Surgical History:  Procedure Laterality Date  . APPENDECTOMY     2006  . COLONOSCOPY WITH PROPOFOL N/A 12/30/2017   Procedure: COLONOSCOPY WITH PROPOFOL;  Surgeon: Lin Landsman, MD;  Location[facility] ENDOSCOPY;  Service: Gastroenterology;  Laterality: N/A;  . ESOPHAGOGASTRODUODENOSCOPY (EGD) WITH PROPOFOL N/A 12/30/2017   Procedure: ESOPHAGOGASTRODUODENOSCOPY (EGD) WITH PROPOFOL;  Surgeon: Lin Landsman, MD;  Location[facility] ENDOSCOPY;  Service: Gastroenterology;  Laterality: N/A;    Family Psychiatric History: Reviewed family psychiatric history from my progress note on 05/04/2019  Family History:  Family History  Problem Relation Age of Onset  . Depression Mother   . Alcohol abuse Father   . Cancer Father        prostate dx'ed  5  . COPD Father   . Hyperlipidemia Father   . Hypertension Father     Social History: Reviewed social history from my progress note on 05/04/2019 Social History   Socioeconomic History  . Marital status: Single    Spouse name: Not on file  . Number of children: Not on file  . Years of education: Not on file  . Highest education level: Not on file  Occupational History  . Occupation: disabled  Tobacco  Use  . Smoking status: Former Smoker    Packs/day: 0.25    Years: 2.00    Pack years: 0.50    Types: Cigarettes    Quit date: 1999    Years since quitting: 22.1  . Smokeless tobacco: Former Systems developer    Types: Chew  Substance and Sexual Activity  . Alcohol use: Not Currently    Comment: former quit 03/14/2012  . Drug use: Not Currently    Comment: former quit cocaine 03/14/2012   . Sexual activity: Yes  Other Topics Concern  . 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 Difficulty of Paying Living Expenses: Not on file  Food Insecurity:   . Worried About Charity fundraiser in the Last Year: Not on file  . Ran Out of Food in the Last Year: Not on file  Transportation Needs:   . Lack of Transportation (Medical): Not on file  . Lack of Transportation (Non-Medical): Not on file  Physical Activity:   . Days of Exercise per Week: Not on file  . Minutes of Exercise per Session: Not on file  Stress:   . Feeling of Stress : Not on file  Social Connections:   . Frequency of Communication with Friends and Family: Not on file  . Frequency of Social Gatherings with Friends and Family: Not on file  . Attends Religious Services: Not on file  . Active Member of Clubs or Organizations: Not on file  . Attends Archivist Meetings: Not on file  . Marital Status: Not on file    Allergies: No Known Allergies  Metabolic Disorder Labs: Lab Results  Component Value Date   HGBA1C 5.6 09/13/2019   MPG 114.02 09/13/2019   No results found for: PROLACTIN Lab Results  Component Value Date   CHOL 194 08/10/2019   TRIG 60.0 08/10/2019   HDL 56.20 08/10/2019   CHOLHDL 3 08/10/2019   VLDL 12.0 08/10/2019   LDLCALC 126 (H) 08/10/2019   LDLCALC 114 (H) 10/25/2017   Lab Results  Component Value Date   TSH 1.76 08/10/2019   TSH  0.97 06/23/2018    Therapeutic Level Labs: Lab Results  Component Value Date   LITHIUM 0.4 (L) 06/23/2018   LITHIUM 0.8 10/25/2017   No results found for: VALPROATE No components found for:  CBMZ  Current Medications: Current Outpatient Medications  Medication Sig Dispense Refill  . buPROPion (WELLBUTRIN XL) 300 MG 24 hr tablet Take 1 tablet (300 mg total) by mouth daily. 90 tablet 0  . busPIRone (BUSPAR) 30 MG tablet Take 1 tablet (30 mg total) by mouth 2 (two) times daily. 180 tablet 1  . clomiPHENE (CLOMID) 50 MG tablet Take by mouth.    . escitalopram (LEXAPRO) 10 MG tablet Take  1 tablet (10 mg total) by mouth daily. 90 tablet 1  . hydrOXYzine (ATARAX/VISTARIL) 25 MG tablet Take 1 tablet (25 mg total) by mouth every 6 (six) hours as needed for anxiety. 90 tablet 1  . lamoTRIgine (LAMICTAL) 200 MG tablet Take 1 tablet (200 mg total) by mouth daily. 90 tablet 1  . montelukast (SINGULAIR) 10 MG tablet Take 1 tablet (10 mg total) by mouth daily. 90 tablet 3  . ondansetron (ZOFRAN-ODT) 4 MG disintegrating tablet     . SUMAtriptan (IMITREX) 100 MG tablet     . zolpidem (AMBIEN) 10 MG tablet Take 1 tablet (10 mg total) by mouth at bedtime as needed for sleep. 90 tablet 0   No current facility-administered medications for this visit.     Musculoskeletal: Strength & Muscle Tone: UTA Gait & Station: normal Patient leans: N/A  Psychiatric Specialty Exam: Review of Systems  Psychiatric/Behavioral: Positive for dysphoric mood.  All other systems reviewed and are negative.   There were no vitals taken for this visit.There is no height or weight on file to calculate BMI.  General Appearance: Casual  Eye Contact:  Fair  Speech:  Normal Rate  Volume:  Normal  Mood:  Dysphoric improving  Affect:  Congruent  Thought Process:  Goal Directed and Descriptions of Associations: Intact  Orientation:  Full (Time, Place, and Person)  Thought Content: Logical   Suicidal Thoughts:  No   Homicidal Thoughts:  No  Memory:  Immediate;   Fair Recent;   Fair Remote;   Fair  Judgement:  Fair  Insight:  Fair  Psychomotor Activity:  Normal  Concentration:  Concentration: Fair and Attention Span: Fair  Recall:  [employer] of Knowledge: Fair  Language: Fair  Akathisia:  No  Handed:  Right  AIMS (if indicated): UTA  Assets:  Communication Skills Desire for [place] Talents/Skills Transportation  ADL's:  Intact  Cognition: WNL  Sleep:  Fair   Screenings: AIMS     Admission (Discharged) from 09/09/2019 in [place] Total Score  0    AUDIT     Admission (Discharged) from 09/09/2019 in [place] Admission (Discharged) from 06/01/2019 in [place]  Alcohol Use Disorder Identification Test Final Score (AUDIT)  40  33    PHQ2-9     Office Visit from 07/09/2019 in [place] from 06/26/2019 in [facility] Office Visit from 06/24/2018 in [place] Office Visit from 06/19/2018 in [place] Procedure visit from 05/19/2018 in [place]  PHQ-2 Total Score  2  0  0  0  0  PHQ-9 Total Score  4  --  --  --  --       Assessment and Plan: Johnny Villa is a 44 year old male, divorced, lives in [place], has a history of PTSD, panic attack, bipolar disorder, alcohol and cocaine use disorder, migraine headache was evaluated by telemedicine today.  Patient is biologically predisposed given his history of substance abuse problems in the past as well as history of trauma.  Patient is currently making progress with regards to his depression.  Plan as noted below.  Plan PTSD-stable Lexapro 10 mg p.o. daily BuSpar 30 mg p.o. twice daily. Ambien 5 to 10 mg p.o. nightly as needed Patient advised to limit  the use of Ambien.  Melatonin over-the-counter as needed  Bipolar disorder current episode depressed-improving Lamictal 200 mg p.o. daily Wellbutrin XL 300 mg p.o. daily If patient continues to improve then he can be taken off of the Lexapro or the BuSpar.  Alcohol use disorder in early remission Patient continues to attend AA meetings.  Cocaine use disorder in remission Patient continues to stay sober.  Follow-up in clinic in 4 weeks or sooner if needed.  March 25 at 3. PM .  I have spent atleast 20 minutes non face to face with patient today. More than 50 % of the time was spent for ordering medications and test ,psychoeducation and supportive psychotherapy and care coordination,as well as documenting clinical information in electronic health record. This note was generated in part or whole with voice recognition software. Voice recognition is usually quite accurate but there are transcription errors that can and very often do occur. I apologize for any typographical errors that were not detected and corrected.        Ursula Alert, MD 11/24/2019, 12:39 PM

## 2019-12-22 ENCOUNTER — Telehealth: Payer: Self-pay | Admitting: Psychiatry

## 2019-12-22 NOTE — Telephone Encounter (Signed)
I have completed [place] form.  Nurse to call patient to let him know it is ready.

## 2019-12-24 ENCOUNTER — Other Ambulatory Visit: Payer: Self-pay

## 2019-12-24 ENCOUNTER — Encounter: Payer: Self-pay | Admitting: Psychiatry

## 2019-12-24 ENCOUNTER — Ambulatory Visit (INDEPENDENT_AMBULATORY_CARE_PROVIDER_SITE_OTHER): Payer: Medicare PPO | Admitting: Psychiatry

## 2019-12-24 DIAGNOSIS — F4310 Post-traumatic stress disorder, unspecified: Secondary | ICD-10-CM | POA: Diagnosis not present

## 2019-12-24 DIAGNOSIS — F1421 Cocaine dependence, in remission: Secondary | ICD-10-CM | POA: Diagnosis not present

## 2019-12-24 DIAGNOSIS — F5105 Insomnia due to other mental disorder: Secondary | ICD-10-CM

## 2019-12-24 DIAGNOSIS — F1021 Alcohol dependence, in remission: Secondary | ICD-10-CM | POA: Diagnosis not present

## 2019-12-24 DIAGNOSIS — F3132 Bipolar disorder, current episode depressed, moderate: Secondary | ICD-10-CM

## 2019-12-24 MED ORDER — QUETIAPINE FUMARATE 50 MG PO TABS: 50.0000 mg | ORAL_TABLET | Freq: Every day | ORAL | 1 refills | Status: DC | PRN

## 2019-12-24 NOTE — Progress Notes (Signed)
Provider Location : [facility] Patient Location : Home  Virtual Visit via Video Note  I connected with Johnny Villa on 12/24/19 at  3:00 PM EDT by a video enabled telemedicine application and verified that I am speaking with the correct person using two identifiers.   I discussed the limitations of evaluation and management by telemedicine and the availability of in person appointments. The patient expressed understanding and agreed to proceed.    I discussed the assessment and treatment plan with the patient. The patient was provided an opportunity to ask questions and all were answered. The patient agreed with the plan and demonstrated an understanding of the instructions.   The patient was advised to call back or seek an in-person evaluation if the symptoms worsen or if the condition fails to improve as anticipated.   [place] MD OP Progress Note  12/24/2019 5:26 PM Johnny Villa  MRN:  [id number]  Chief Complaint:  Chief Complaint    Follow-up     HPI: Johnny Villa is a 44 year old male, currently lives in [place], disabled, has a history of PTSD, bipolar disorder, panic attacks, alcohol use disorder, migraine headaches, cocaine use disorder in remission was evaluated by telemedicine today.  Patient today reports he is currently struggling with severe back pain which radiates down to his thighs and his lower extremities.  Patient appeared to be in pain during the session.  Patient appeared to be drowsy and also had slurred speech on and off during the session.  When asked about it he reported that he had just taken Robaxin, muscle relaxant for his pain.  Patient appeared to be alert and oriented and was able to answer questions appropriately.  Patient today reports he is currently having racing thoughts, rumination about his past.  He reports it was triggered by two of his friends who currently are staying with him.  He reports that his friend's current situation triggered his memories  again.  Patient does report feeling sad however denies any significant depressive symptoms.  He reports sleep and appetite as good.  Patient denies any suicidality, homicidality or perceptual disturbances.  Patient is compliant on medications as prescribed.  He denies any side effects to medications.  Patient reports he has not relapsed on alcohol, continues to stay sober.  Continues to be in [employer].       Visit Diagnosis:    ICD-10-CM   1. PTSD (post-traumatic stress disorder)  F43.10 QUEtiapine (SEROQUEL) 50 MG tablet  2. Bipolar disorder, current episode depressed, moderate ([name])  F31.32 QUEtiapine (SEROQUEL) 50 MG tablet  3. Insomnia due to mental condition  F51.05   4. Alcohol use disorder, moderate, in early remission ([place])  F10.21   5. Cocaine use disorder, moderate, in sustained remission ([place])  F14.21     Past Psychiatric History: I have reviewed past psychiatric history from my progress note on 05/04/2019.  Past trials of Klonopin, Lexapro, Rexulti, Wellbutrin, lamotrigine, lithium, BuSpar, Vraylar  Past Medical History:  Past Medical History:  Diagnosis Date  . Bipolar disorder [facility])    psychiatrist in [place]  . Blood in stool   . Depression   . Drug abuse in remission [facility])    sober for 6 years  . Fatty liver 01/2018  . H/O alcohol abuse   . Headache    migraines  . Hypertension   . Low testosterone in male     Past Surgical History:  Procedure Laterality Date  . APPENDECTOMY     2006  .  COLONOSCOPY WITH PROPOFOL N/A 12/30/2017   Procedure: COLONOSCOPY WITH PROPOFOL;  Surgeon: Lin Landsman, MD;  Location: [facility] ENDOSCOPY;  Service: Gastroenterology;  Laterality: N/A;  . ESOPHAGOGASTRODUODENOSCOPY (EGD) WITH PROPOFOL N/A 12/30/2017   Procedure: ESOPHAGOGASTRODUODENOSCOPY (EGD) WITH PROPOFOL;  Surgeon: Lin Landsman, MD;  Location: [facility] ENDOSCOPY;  Service: Gastroenterology;  Laterality: N/A;    Family Psychiatric History: I have reviewed  family psychiatric history from my progress note on 05/04/2019.  Family History:  Family History  Problem Relation Age of Onset  . Depression Mother   . Alcohol abuse Father   . Cancer Father        prostate dx'ed 87  . COPD Father   . Hyperlipidemia Father   . Hypertension Father     Social History: I have reviewed social history from my progress note on 05/04/2019. Social History   Socioeconomic History  . Marital status: Single    Spouse name: Not on file  . Number of children: Not on file  . Years of education: Not on file  . Highest education level: Not on file  Occupational History  . Occupation: disabled  Tobacco Use  . Smoking status: Former Smoker    Packs/day: 0.25    Years: 2.00    Pack years: 0.50    Types: Cigarettes    Quit date: 1999    Years since quitting: 22.2  . Smokeless tobacco: Former Systems developer    Types: Chew  Substance and Sexual Activity  . Alcohol use: Not Currently    Comment: former quit 03/14/2012  . Drug use: Not Currently    Comment: former quit cocaine 03/14/2012   . Sexual activity: Yes  Other Topics Concern  . 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 Difficulty of Paying Living Expenses:   Food Insecurity:   . Worried About Charity fundraiser in the Last Year:   . Arboriculturist in the Last Year:   Transportation Needs:   . Film/video editor (Medical):   Marland Kitchen Lack of Transportation (Non-Medical):   Physical Activity:   . Days of Exercise per Week:   . Minutes of Exercise per Session:   Stress:   . Feeling of Stress :   Social Connections:   . Frequency of Communication with Friends and Family:   . Frequency of Social Gatherings with Friends and Family:   . Attends Religious Services:   . Active Member of Clubs or Organizations:   . Attends Archivist  Meetings:   Marland Kitchen Marital Status:     Allergies: No Known Allergies  Metabolic Disorder Labs: Lab Results  Component Value Date   HGBA1C 5.6 09/13/2019   MPG 114.02 09/13/2019   No results found for: PROLACTIN Lab Results  Component Value Date   CHOL 194 08/10/2019   TRIG 60.0 08/10/2019   HDL 56.20 08/10/2019   CHOLHDL 3 08/10/2019   VLDL 12.0 08/10/2019   LDLCALC 126 (H) 08/10/2019   LDLCALC 114 (H) 10/25/2017   Lab Results  Component Value Date   TSH 1.76 08/10/2019   TSH 0.97 06/23/2018    Therapeutic Level Labs: Lab Results  Component Value Date   LITHIUM 0.4 (L) 06/23/2018   LITHIUM 0.8 10/25/2017  No results found for: VALPROATE No components found for:  CBMZ  Current Medications: Current Outpatient Medications  Medication Sig Dispense Refill  . buPROPion (WELLBUTRIN XL) 300 MG 24 hr tablet Take 1 tablet (300 mg total) by mouth daily. 90 tablet 0  . busPIRone (BUSPAR) 30 MG tablet Take 1 tablet (30 mg total) by mouth 2 (two) times daily. 180 tablet 1  . clomiPHENE (CLOMID) 50 MG tablet Take by mouth.    . escitalopram (LEXAPRO) 10 MG tablet Take 1 tablet (10 mg total) by mouth daily. 90 tablet 1  . hydrOXYzine (ATARAX/VISTARIL) 25 MG tablet Take 1 tablet (25 mg total) by mouth every 6 (six) hours as needed for anxiety. 90 tablet 1  . lamoTRIgine (LAMICTAL) 200 MG tablet Take 1 tablet (200 mg total) by mouth daily. 90 tablet 1  . montelukast (SINGULAIR) 10 MG tablet Take 1 tablet (10 mg total) by mouth daily. 90 tablet 3  . ondansetron (ZOFRAN-ODT) 4 MG disintegrating tablet     . QUEtiapine (SEROQUEL) 50 MG tablet Take 1 tablet (50 mg total) by mouth daily as needed. For severe anxiety agitation 30 tablet 1  . SUMAtriptan (IMITREX) 100 MG tablet     . zolpidem (AMBIEN) 10 MG tablet Take 1 tablet (10 mg total) by mouth at bedtime as needed for sleep. 90 tablet 0   No current facility-administered medications for this visit.     Musculoskeletal: Strength &  Muscle Tone: UTA Gait & Station: Observed as seated Patient leans: N/A  Psychiatric Specialty Exam: Review of Systems  Musculoskeletal: Positive for back pain.       Radiating to his thighs  Psychiatric/Behavioral: The patient is nervous/anxious.   All other systems reviewed and are negative.   There were no vitals taken for this visit.There is no height or weight on file to calculate BMI.  General Appearance: Casual  Eye Contact:  Fair  Speech:  Normal Rate and slurred at times  Volume:  Normal  Mood:  Anxious  Affect:  UTA  Thought Process:  Goal Directed and Descriptions of Associations: Intact  Orientation:  Full (Time, Place, and Person)  Thought Content: Rumination   Suicidal Thoughts:  No  Homicidal Thoughts:  No  Memory:  Immediate;   Fair Recent;   Fair Remote;   Fair  Judgement:  Fair  Insight:  Fair  Psychomotor Activity:  Normal  Concentration:  Concentration: Fair and Attention Span: Fair  Recall:  [employer] of Knowledge: Fair  Language: Fair  Akathisia:  No  Handed:  Right  AIMS (if indicated): UTA  Assets:  Communication Skills Desire for Improvement Housing Social Support  ADL's:  Intact  Cognition: WNL  Sleep:  Fair   Screenings: AIMS     Admission (Discharged) from 09/09/2019 in [facility] Total Score  0    AUDIT     Admission (Discharged) from 09/09/2019 in [facility] Admission (Discharged) from 06/01/2019 in [facility]  Alcohol Use Disorder Identification Test Final Score (AUDIT)  40  33    PHQ2-9     Office Visit from 07/09/2019 in [facility] from 06/26/2019 in [facility] Office Visit from 06/24/2018 in [facility] Office Visit from 06/19/2018 in [facility] Procedure visit from 05/19/2018 in [facility]  PHQ-2 Total Score  2  0  0  0  0  PHQ-9 Total Score  4  --  --  --  --       Assessment and Plan: Johnny Villa is a 44 year old male, divorced, lives in [place], has a history of PTSD, panic attacks, bipolar disorder, alcohol and cocaine use disorder, migraine headaches was evaluated by telemedicine today.  Patient is biologically predisposed given his history of substance abuse problems in the past as well as history of trauma.  Patient currently struggling with back pain which is severe as well as recent situational stressors which triggered some memories from the past.  Patient will benefit from the following medication readjustment.  Plan as noted below.  Plan PTSD-unstable Continue Lexapro 10 mg p.o. daily.  Long-term plan is to wean him off of it. BuSpar 30 mg p.o. twice daily Ambien 5 to 10 mg p.o. nightly as needed Patient advised to limit the use of Ambien. Start Seroquel 50 mg daily as needed for severe racing thoughts/anxiety.  Bipolar disorder current episode depressed-improving Lamictal 200 mg p.o. daily Wellbutrin XL 300 mg p.o. daily. Long-term plan is to taper him off of either the Lexapro or the BuSpar.  Alcohol use disorder in early remission Patient to continue [place] meetings.  Cocaine use disorder in remission Patient continues to stay sober.  Patient advised to go to the emergency department to get help with his back pain.  Follow-up in clinic in 3 weeks or sooner if needed.  I have spent atleast 20 minutes non face to face with patient today. More than 50 % of the time was spent for preparing to see the patient ( e.g., review of test, records ), obtaining and to review and separately obtained history , ordering medications and test ,psychoeducation and supportive psychotherapy and care coordination,as well as documenting clinical information in electronic health record. This note was generated in part or whole with voice recognition software. Voice  recognition is usually quite accurate but there are transcription errors that can and very often do occur. I apologize for any typographical errors that were not detected and corrected.       Ursula Alert, MD 12/24/2019, 5:26 PM

## 2019-12-28 ENCOUNTER — Ambulatory Visit
Payer: Medicare PPO | Attending: Student in an Organized Health Care Education/Training Program | Admitting: Student in an Organized Health Care Education/Training Program

## 2019-12-28 ENCOUNTER — Other Ambulatory Visit: Payer: Self-pay

## 2019-12-28 ENCOUNTER — Encounter: Payer: Self-pay | Admitting: Student in an Organized Health Care Education/Training Program

## 2019-12-28 DIAGNOSIS — M5416 Radiculopathy, lumbar region: Secondary | ICD-10-CM | POA: Diagnosis not present

## 2019-12-28 MED ORDER — PREDNISONE 20 MG PO TABS: ORAL_TABLET | ORAL | 0 refills | Status: AC

## 2019-12-28 NOTE — Progress Notes (Signed)
Patient: Johnny Villa  Service Category: E/M  Provider: Gillis Santa, MD  DOB: [birth year]  DOS: 12/28/2019  Location: Office  MRN: [id number]  Setting: Ambulatory outpatient  Referring Provider: McLean-Scocuzza, Olivia Mackie *  Type: Established Patient  Specialty: Interventional Pain Management  PCP: McLean-Scocuzza, Nino Glow, MD  Location: Home  Delivery: TeleHealth     Virtual Encounter - Pain Management PROVIDER NOTE: Information contained herein reflects review and annotations entered in association with encounter. Interpretation of such information and data should be left to medically-trained personnel. Information provided to patient can be located elsewhere in the medical record under "Patient Instructions". Document created using STT-dictation technology, any transcriptional errors that may result from process are unintentional.    Contact & Pharmacy Preferred: [phone] Home: [phone] (home) Mobile: [phone] (mobile) E-mail: [email or web address], [place] [address] Phone: [phone] Fax: [phone]   Pre-screening  Mr. Tagg offered "in-person" vs "virtual" encounter. He indicated preferring virtual for this encounter.   Reason COVID-19*  Social distancing based on CDC and AMA recommendations.   I contacted [name] on 12/28/2019 viavirtual televisit I clearly identified myself as Gillis Santa, MD. I verified that I was speaking with the correct person using two identifiers (Name: Doak Mah, and date of birth: May 27, 1976).  Consent I sought verbal advanced consent from [name] for virtual visit interactions. I informed Mr. Tetrick of possible security and privacy concerns, risks, and limitations associated with providing "not-in-person" medical evaluation and management services. I also informed Mr. Everingham of the availability of "in-person"  appointments. Finally, I informed him that there would be a charge for the virtual visit and that he could be  personally, fully or partially, financially responsible for it. Mr. Zingaro expressed understanding and agreed to proceed.   Historic Elements   Mr. Chalmers Iddings is a 44 y.o. year old, male patient evaluated today after his last contact with our practice in 2019 for management of his cervical facet joint syndrome. Mr. Hodkinson  has a past medical history of Bipolar disorder ([place]), Blood in stool, Depression, Drug abuse in remission [facility]), Fatty liver (01/2018), H/O alcohol abuse, Headache, Hypertension, and Low testosterone in male. He also  has a past surgical history that includes Appendectomy; Colonoscopy with propofol (N/A, 12/30/2017); and Esophagogastroduodenoscopy (egd) with propofol (N/A, 12/30/2017). Mr. Fiveash has a current medication list which includes the following prescription(s): bupropion, buspirone, clomiphene, escitalopram, hydroxyzine, lamotrigine, quetiapine, sumatriptan, zolpidem, ondansetron, and prednisone. He  reports that he quit smoking about 22 years ago. His smoking use included cigarettes. He has a 0.50 pack-year smoking history. He has quit using smokeless tobacco.  His smokeless tobacco use included chew. He reports previous alcohol use. He reports previous drug use. Mr. Hudler has No Known Allergies.   HPI  Today, he is being contacted for new problems.   Worsening low back pain with radiation to right hip, leg, toe. No inciting event however patient has been working out more with weight lifting (dead lifts, running) Patient denies any lower extremity weakness Denies any bowel or bladder dysfunction, denies any recent falls. States that he had an epidural steroid injection done over 10 years ago in [place].   Laboratory Chemistry Profile   Renal Lab Results  Component Value Date   BUN 13 09/13/2019   CREATININE 0.99 09/13/2019   BCR 14 03/18/2019   GFR  82.16 08/10/2019   GFRAA >60 09/13/2019   GFRNONAA >60  09/13/2019    Hepatic Lab Results  Component Value Date   AST 17 09/13/2019   ALT 19 09/13/2019   ALBUMIN 4.0 09/13/2019   ALKPHOS 38 09/13/2019    Electrolytes Lab Results  Component Value Date   NA 139 09/13/2019   K 4.1 09/13/2019   CL 104 09/13/2019   CALCIUM 9.2 09/13/2019   MG 2.3 09/13/2019   PHOS 3.4 09/13/2019    Bone Lab Results  Component Value Date   VD25OH 115.23 (HH) 08/10/2019   TESTOFREE 114.2 08/10/2019   TESTOSTERONE 595 08/10/2019    Inflammation (CRP: Acute Phase) (ESR: Chronic Phase) Lab Results  Component Value Date   CRP 1.0 10/25/2017   ESRSEDRATE 5 10/25/2017      Note: Above Lab results reviewed.  Imaging  DG Chest 2 View CLINICAL DATA:  Cough, congestion, shortness of breath.  EXAM: CHEST - 2 VIEW  COMPARISON:  None.  FINDINGS: The cardiomediastinal contours are normal. Minimal bronchial thickening. Pulmonary vasculature is normal. No consolidation, pleural effusion, or pneumothorax. No acute osseous abnormalities are seen.  IMPRESSION: Minimal bronchial thickening.  Electronically Signed   By: Keith Rake M.D.   On: 08/30/2018 05:40  Assessment  The primary encounter diagnosis was Lumbar radiculopathy. A diagnosis of Lumbar radicular pain was also pertinent to this visit.  Plan of Care  Mr. Nicodemus Denk has a current medication list which includes the following long-term medication(s): bupropion, escitalopram, lamotrigine, quetiapine, and zolpidem.  Pharmacotherapy (Medications Ordered): Meds ordered this encounter  Medications  . predniSONE (DELTASONE) 20 MG tablet    Sig: Take 3 tablets (60 mg total) by mouth daily with breakfast for 3 days, THEN 2 tablets (40 mg total) daily with breakfast for 3 days, THEN 1 tablet (20 mg total) daily with breakfast for 3 days.    Dispense:  18 tablet    Refill:  0   Orders:  Orders Placed This Encounter  Procedures   . Lumbar Epidural Injection    Standing Status:   Standing    Number of Occurrences:   9    Standing Expiration Date:   06/29/2021    Scheduling Instructions:     Purpose: Palliative     Indication: Lower extremity pain/Sciatica unspecified side (M54.30).     Side: Midline     Level: TBD     Sedation: without     TIMEFRAME: PRN procedure. (Mr. Holifield will call when needed.)    Order Specific Question:   Where will this procedure be performed?    Answer:   [facility]   Follow-up plan:   Return if symptoms worsen or fail to improve.    Recent Visits No visits were found meeting these conditions.  Showing recent visits within past 90 days and meeting all other requirements   Future Appointments No visits were found meeting these conditions.  Showing future appointments within next 90 days and meeting all other requirements   I discussed the assessment and treatment plan with the patient. The patient was provided an opportunity to ask questions and all were answered. The patient agreed with the plan and demonstrated an understanding of the instructions.  Patient advised to call back or seek an in-person evaluation if the symptoms or condition worsens.  Duration of encounter: 25 minutes.  Note by: Gillis Santa, MD Date: 12/28/2019; Time: 1:59 PM

## 2020-01-07 ENCOUNTER — Ambulatory Visit: Payer: Medicare PPO | Attending: Internal Medicine

## 2020-01-07 DIAGNOSIS — Z23 Encounter for immunization: Secondary | ICD-10-CM

## 2020-01-07 NOTE — Progress Notes (Signed)
   Covid-19 Vaccination Clinic  Name:  Johnny Villa    MRN: [id number] DOB: 1976-03-12  01/07/2020  [name] was observed post Covid-19 immunization for 15 minutes without incident. He was provided with Vaccine Information Sheet and instruction to access the V-Safe system.   [name] was instructed to call 911 with any severe reactions post vaccine: Marland Kitchen Difficulty breathing  . Swelling of face and throat  . A fast heartbeat  . A bad rash all over body  . Dizziness and weakness   Immunizations Administered    Name Date Dose VIS Date Route   Pfizer COVID-19 Vaccine 01/07/2020  8:11 AM 0.3 mL 09/11/2019 Intramuscular   Manufacturer: [place]   Lot: [id number]   [place]: [id number]

## 2020-01-11 ENCOUNTER — Ambulatory Visit (HOSPITAL_BASED_OUTPATIENT_CLINIC_OR_DEPARTMENT_OTHER): Payer: Medicare PPO | Admitting: Student in an Organized Health Care Education/Training Program

## 2020-01-11 ENCOUNTER — Encounter: Payer: Self-pay | Admitting: Student in an Organized Health Care Education/Training Program

## 2020-01-11 ENCOUNTER — Ambulatory Visit
Admission: RE | Admit: 2020-01-11 | Discharge: 2020-01-11 | Disposition: A | Discharge status: Home / Self Care | Payer: Medicare PPO | Source: Ambulatory Visit | Attending: Student in an Organized Health Care Education/Training Program | Admitting: Student in an Organized Health Care Education/Training Program

## 2020-01-11 ENCOUNTER — Other Ambulatory Visit: Payer: Self-pay

## 2020-01-11 DIAGNOSIS — M5416 Radiculopathy, lumbar region: Secondary | ICD-10-CM

## 2020-01-11 MED ORDER — LIDOCAINE HCL 2 % IJ SOLN
INTRAMUSCULAR | Status: AC
  Filled 2020-01-11: qty 20

## 2020-01-11 MED ORDER — DEXAMETHASONE SODIUM PHOSPHATE 10 MG/ML IJ SOLN
10.0000 mg | Freq: Once | INTRAMUSCULAR | Status: AC
  Administered 2020-01-11: 10 mg

## 2020-01-11 MED ORDER — LIDOCAINE HCL 2 % IJ SOLN
20.0000 mL | Freq: Once | INTRAMUSCULAR | Status: AC
  Administered 2020-01-11: 400 mg

## 2020-01-11 MED ORDER — IOHEXOL 180 MG/ML  SOLN
INTRAMUSCULAR | Status: AC
  Filled 2020-01-11: qty 20

## 2020-01-11 MED ORDER — ROPIVACAINE HCL 2 MG/ML IJ SOLN
INTRAMUSCULAR | Status: AC
  Filled 2020-01-11: qty 10

## 2020-01-11 MED ORDER — DEXAMETHASONE SODIUM PHOSPHATE 10 MG/ML IJ SOLN
INTRAMUSCULAR | Status: AC
  Filled 2020-01-11: qty 1

## 2020-01-11 MED ORDER — SODIUM CHLORIDE 0.9% FLUSH
2.0000 mL | Freq: Once | INTRAVENOUS | Status: AC
  Administered 2020-01-11: 2 mL

## 2020-01-11 MED ORDER — ROPIVACAINE HCL 2 MG/ML IJ SOLN
2.0000 mL | Freq: Once | INTRAMUSCULAR | Status: AC
  Administered 2020-01-11: 2 mL via EPIDURAL

## 2020-01-11 MED ORDER — IOHEXOL 180 MG/ML  SOLN
10.0000 mL | Freq: Once | INTRAMUSCULAR | Status: AC
  Administered 2020-01-11: 10 mL via EPIDURAL

## 2020-01-11 MED ORDER — SODIUM CHLORIDE (PF) 0.9 % IJ SOLN
INTRAMUSCULAR | Status: AC
  Filled 2020-01-11: qty 10

## 2020-01-11 MED ORDER — GABAPENTIN 300 MG PO CAPS: ORAL_CAPSULE | ORAL | 0 refills | Status: DC

## 2020-01-11 NOTE — Patient Instructions (Addendum)

## 2020-01-11 NOTE — Progress Notes (Signed)
PROVIDER NOTE: Information contained herein reflects review and annotations entered in association with encounter. Interpretation of such information and data should be left to medically-trained personnel. Information provided to patient can be located elsewhere in the medical record under "Patient Instructions". Document created using STT-dictation technology, any transcriptional errors that may result from process are unintentional.    Patient: Johnny Villa  Service Category: Procedure  Provider: Gillis Santa, MD  DOB: 05/02/1976  DOS: 01/11/2020  Location: [facility]  MRN: [id number]  Setting: Ambulatory - outpatient  Referring Provider: Gillis Santa, MD  Type: Established Patient  Specialty: Interventional Pain Management  PCP: McLean-Scocuzza, Nino Glow, MD   Primary Reason for Visit: Interventional Pain Management Treatment. CC: Back Pain (lower)  Procedure:          Anesthesia, Analgesia, Anxiolysis:  Type: Diagnostic Inter-Laminar Epidural Steroid Injection  #1  Region: Lumbar Level: L4-5 Level. Laterality: Right-Sided         Type: Local Anesthesia  Local Anesthetic: Lidocaine 1-2%  Position: Prone with head of the table was raised to facilitate breathing.   Indications: 1. Lumbar radiculopathy    Pain Score: Pre-procedure: 7 /10 Post-procedure: 6 /10   Pre-op Assessment:  Johnny Villa is a 44 y.o. (year old), male patient, seen today for interventional treatment. He  has a past surgical history that includes Appendectomy; Colonoscopy with propofol (N/A, 12/30/2017); and Esophagogastroduodenoscopy (egd) with propofol (N/A, 12/30/2017). Johnny Villa has a current medication list which includes the following prescription(s): bupropion, buspirone, clomiphene, escitalopram, hydroxyzine, lamotrigine, ondansetron, quetiapine, sumatriptan, zolpidem, and gabapentin. His primarily concern today is the Back Pain (lower)  Initial Vital Signs:  Pulse/HCG Rate: (!) 57ECG Heart  Rate: 65 Temp: (!) 97.3 F (36.3 C) Resp: 16 BP: 116/77 SpO2: 99 %  BMI: Estimated body mass index is 29.29 kg/m as calculated from the following:   Height as of this encounter: 5\' 11"  (1.803 m).   Weight as of this encounter: 210 lb (95.3 kg).  Risk Assessment: Allergies: Reviewed. He has No Known Allergies.  Allergy Precautions: None required Coagulopathies: Reviewed. None identified.  Blood-thinner therapy: None at this time Active Infection(s): Reviewed. None identified. Johnny Villa is afebrile  Site Confirmation: Johnny Villa was asked to confirm the procedure and laterality before marking the site Procedure checklist: Completed Consent: Before the procedure and under the influence of no sedative(s), amnesic(s), or anxiolytics, the patient was informed of the treatment options, risks and possible complications. To fulfill our ethical and legal obligations, as recommended by the American Medical Association's Code of Ethics, I have informed the patient of my clinical impression; the nature and purpose of the treatment or procedure; the risks, benefits, and possible complications of the intervention; the alternatives, including doing nothing; the risk(s) and benefit(s) of the alternative treatment(s) or procedure(s); and the risk(s) and benefit(s) of doing nothing. The patient was provided information about the general risks and possible complications associated with the procedure. These may include, but are not limited to: failure to achieve desired goals, infection, bleeding, organ or nerve damage, allergic reactions, paralysis, and death. In addition, the patient was informed of those risks and complications associated to Spine-related procedures, such as failure to decrease pain; infection (i.e.: Meningitis, epidural or intraspinal abscess); bleeding (i.e.: epidural hematoma, subarachnoid hemorrhage, or any other type of intraspinal or peri-dural bleeding); organ or nerve damage (i.e.:  Any type of peripheral nerve, nerve root, or spinal cord injury) with subsequent damage to sensory, motor, and/or autonomic systems, resulting in permanent  pain, numbness, and/or weakness of one or several areas of the body; allergic reactions; (i.e.: anaphylactic reaction); and/or death. Furthermore, the patient was informed of those risks and complications associated with the medications. These include, but are not limited to: allergic reactions (i.e.: anaphylactic or anaphylactoid reaction(s)); adrenal axis suppression; blood sugar elevation that in diabetics may result in ketoacidosis or comma; water retention that in patients with history of congestive heart failure may result in shortness of breath, pulmonary edema, and decompensation with resultant heart failure; weight gain; swelling or edema; medication-induced neural toxicity; particulate matter embolism and blood vessel occlusion with resultant organ, and/or nervous system infarction; and/or aseptic necrosis of one or more joints. Finally, the patient was informed that Medicine is not an exact science; therefore, there is also the possibility of unforeseen or unpredictable risks and/or possible complications that may result in a catastrophic outcome. The patient indicated having understood very clearly. We have given the patient no guarantees and we have made no promises. Enough time was given to the patient to ask questions, all of which were answered to the patient's satisfaction. Johnny Villa has indicated that he wanted to continue with the procedure. Attestation: I, the ordering provider, attest that I have discussed with the patient the benefits, risks, side-effects, alternatives, likelihood of achieving goals, and potential problems during recovery for the procedure that I have provided informed consent. Date  Time: 01/11/2020 10:31 AM  Pre-Procedure Preparation:  Monitoring: As per clinic protocol. Respiration, ETCO2, SpO2, BP, heart rate  and rhythm monitor placed and checked for adequate function Safety Precautions: Patient was assessed for positional comfort and pressure points before starting the procedure. Time-out: I initiated and conducted the "Time-out" before starting the procedure, as per protocol. The patient was asked to participate by confirming the accuracy of the "Time Out" information. Verification of the correct person, site, and procedure were performed and confirmed by me, the nursing staff, and the patient. "Time-out" conducted as per Joint Commission's Universal Protocol (UP.01.01.01). Time: 1121  Description of Procedure:          Target Area: The interlaminar space, initially targeting the lower laminar border of the superior vertebral body. Approach: Paramedial approach. Area Prepped: Entire Posterior Lumbar Region DuraPrep (Iodine Povacrylex [0.7% available iodine] and Isopropyl Alcohol, 74% w/w) Safety Precautions: Aspiration looking for blood return was conducted prior to all injections. At no point did we inject any substances, as a needle was being advanced. No attempts were made at seeking any paresthesias. Safe injection practices and needle disposal techniques used. Medications properly checked for expiration dates. SDV (single dose vial) medications used. Description of the Procedure: Protocol guidelines were followed. The procedure needle was introduced through the skin, ipsilateral to the reported pain, and advanced to the target area. Bone was contacted and the needle walked caudad, until the lamina was cleared. The epidural space was identified using "loss-of-resistance technique" with 2-3 ml of PF-NaCl (0.9% NSS), in a 5cc LOR glass syringe.  Vitals:   01/11/20 1114 01/11/20 1119 01/11/20 1123 01/11/20 1125  BP: 123/80 117/84 121/89 (!) 126/91  Pulse:      Resp: 20 17 15 18   Temp:      TempSrc:      SpO2: 97% 97% 98% 98%  Weight:      Height:        Start Time: 1121 hrs. End Time: 1124  hrs.  Materials:  Needle(s) Type: Epidural needle Gauge: 22G Length: 3.5-in Medication(s): Please see orders for medications and dosing  details. 8 cc solution made of 5 cc of preservative-free saline, 2 cc of 0.2% ropivacaine, 1 cc of Decadron 10 mg/cc.  Imaging Guidance (Spinal):          Type of Imaging Technique: Fluoroscopy Guidance (Spinal) Indication(s): Assistance in needle guidance and placement for procedures requiring needle placement in or near specific anatomical locations not easily accessible without such assistance. Exposure Time: Please see nurses notes. Contrast: Before injecting any contrast, we confirmed that the patient did not have an allergy to iodine, shellfish, or radiological contrast. Once satisfactory needle placement was completed at the desired level, radiological contrast was injected. Contrast injected under live fluoroscopy. No contrast complications. See chart for type and volume of contrast used. Fluoroscopic Guidance: I was personally present during the use of fluoroscopy. "Tunnel Vision Technique" used to obtain the best possible view of the target area. Parallax error corrected before commencing the procedure. "Direction-depth-direction" technique used to introduce the needle under continuous pulsed fluoroscopy. Once target was reached, antero-posterior, oblique, and lateral fluoroscopic projection used confirm needle placement in all planes. Images permanently stored in EMR. Interpretation: I personally interpreted the imaging intraoperatively. Adequate needle placement confirmed in multiple planes. Appropriate spread of contrast into desired area was observed. No evidence of afferent or efferent intravascular uptake. No intrathecal or subarachnoid spread observed. Permanent images saved into the patient's record.  Antibiotic Prophylaxis:   Anti-infectives (From admission, onward)   None     Indication(s): None identified  Post-operative Assessment:   Post-procedure Vital Signs:  Pulse/HCG Rate: (!) 5764 Temp: (!) 97.3 F (36.3 C) Resp: 18 BP: (!) 126/91 SpO2: 98 %  EBL: None  Complications: No immediate post-treatment complications observed by team, or reported by patient.  Note: The patient tolerated the entire procedure well. A repeat set of vitals were taken after the procedure and the patient was kept under observation following institutional policy, for this type of procedure. Post-procedural neurological assessment was performed, showing return to baseline, prior to discharge. The patient was provided with post-procedure discharge instructions, including a section on how to identify potential problems. Should any problems arise concerning this procedure, the patient was given instructions to immediately contact us, at any time, without hesitation. In any case, we plan to contact the patient by telephone for a follow-up status report regarding this interventional procedure.  Comments:  No additional relevant information.  5 out of 5 strength bilateral lower extremity: Plantar flexion, dorsiflexion, knee flexion, knee extension.  Plan of Care  Orders:  Orders Placed This Encounter  Procedures  . DG PAIN [facility] C-ARM 1-60 MIN NO REPORT    Intraoperative interpretation by procedural physician at [facility].    Standing Status:   Standing    Number of Occurrences:   1    Order Specific Question:   Reason for exam:    Answer:   Assistance in needle guidance and placement for procedures requiring needle placement in or near specific anatomical locations not easily accessible without such assistance.    Medications ordered for procedure: Meds ordered this encounter  Medications  . iohexol (OMNIPAQUE) 180 MG/ML injection 10 mL    Must be Myelogram-compatible. If not available, you may substitute with a water-soluble, non-ionic, hypoallergenic, myelogram-compatible radiological contrast medium.  Marland Kitchen lidocaine  (XYLOCAINE) 2 % (with pres) injection 400 mg  . ropivacaine (PF) 2 mg/mL (0.2%) (NAROPIN) injection 2 mL  . sodium chloride flush (NS) 0.9 % injection 2 mL  . dexamethasone (DECADRON) injection 10 mg  .  gabapentin (NEURONTIN) 300 MG capsule    Sig: Take 1 capsule (300 mg total) by mouth at bedtime for 14 days, THEN 1 capsule (300 mg total) 2 (two) times daily for 26 days.    Dispense:  66 capsule    Refill:  0   Medications administered: We administered iohexol, lidocaine, ropivacaine (PF) 2 mg/mL (0.2%), sodium chloride flush, and dexamethasone.  See the medical record for exact dosing, route, and time of administration.  Follow-up plan:   Return in about 5 weeks (around 02/15/2020) for Post Procedure Evaluation, virtual.      Status post right C4, C5, C6, C7 RFA on 05/19/2018 for cervical spondylosis.  Status post right L4-L5 ESI #1 on 01/11/2020   Recent Visits No visits were found meeting these conditions.  Showing recent visits within past 90 days and meeting all other requirements   Today's Visits Date Type Provider Dept  01/11/20 Procedure visit Gillis Santa, MD [facility]  Showing today's visits and meeting all other requirements   Future Appointments Date Type Provider Dept  02/17/20 Appointment Gillis Santa, MD [facility]  Showing future appointments within next 90 days and meeting all other requirements   Disposition: Discharge home  Discharge (Date  Time): 01/11/2020; 1132 hrs.   Primary Care Physician: McLean-Scocuzza, Nino Glow, MD Location: [facility] Note by: Gillis Santa, MD Date: 01/11/2020; Time: 1:09 PM  Disclaimer:  Medicine is not an exact science. The only guarantee in medicine is that nothing is guaranteed. It is important to note that the decision to proceed with this intervention was based on the information collected from the patient. The Data and conclusions were drawn from the patient's questionnaire,  the interview, and the physical examination. Because the information was provided in large part by the patient, it cannot be guaranteed that it has not been purposely or unconsciously manipulated. Every effort has been made to obtain as much relevant data as possible for this evaluation. It is important to note that the conclusions that lead to this procedure are derived in large part from the available data. Always take into account that the treatment will also be dependent on availability of resources and existing treatment guidelines, considered by other Pain Management Practitioners as being common knowledge and practice, at the time of the intervention. For Medico-Legal purposes, it is also important to point out that variation in procedural techniques and pharmacological choices are the acceptable norm. The indications, contraindications, technique, and results of the above procedure should only be interpreted and judged by a Board-Certified Interventional Pain Specialist with extensive familiarity and expertise in the same exact procedure and technique.

## 2020-01-11 NOTE — Progress Notes (Signed)
Safety precautions to be maintained throughout the outpatient stay will include: orient to surroundings, keep bed in low position, maintain call bell within reach at all times, provide assistance with transfer out of bed and ambulation.  

## 2020-01-12 ENCOUNTER — Telehealth: Payer: Self-pay | Admitting: *Deleted

## 2020-01-12 NOTE — Telephone Encounter (Signed)
-----   Message from Gillis Santa, MD sent at 01/12/2020  9:30 AM EDT ----- He can increase to 300 mg BID after 7-14 days, up to him ----- Message ----- From: Landis Martins, RN Sent: 01/12/2020   8:39 AM EDT To: Gillis Santa, MD  Instructions on Gabapentin state starting dose is for 14 days. Patient says you discussed only 7 days.

## 2020-01-12 NOTE — Telephone Encounter (Signed)
No problems post procedure. 

## 2020-01-12 NOTE — Telephone Encounter (Signed)
Patient advised he may increase after 7 days if side effects are minimal. May wait 14 days if necessary.

## 2020-01-19 ENCOUNTER — Other Ambulatory Visit: Payer: Self-pay | Admitting: Psychiatry

## 2020-01-20 ENCOUNTER — Telehealth (INDEPENDENT_AMBULATORY_CARE_PROVIDER_SITE_OTHER): Payer: Medicare PPO | Admitting: Psychiatry

## 2020-01-20 ENCOUNTER — Encounter: Payer: Self-pay | Admitting: Psychiatry

## 2020-01-20 ENCOUNTER — Other Ambulatory Visit (HOSPITAL_COMMUNITY): Payer: Self-pay | Admitting: Psychiatry

## 2020-01-20 ENCOUNTER — Other Ambulatory Visit: Payer: Self-pay

## 2020-01-20 DIAGNOSIS — F3132 Bipolar disorder, current episode depressed, moderate: Secondary | ICD-10-CM

## 2020-01-20 DIAGNOSIS — F1021 Alcohol dependence, in remission: Secondary | ICD-10-CM

## 2020-01-20 DIAGNOSIS — F5105 Insomnia due to other mental disorder: Secondary | ICD-10-CM | POA: Diagnosis not present

## 2020-01-20 DIAGNOSIS — F4310 Post-traumatic stress disorder, unspecified: Secondary | ICD-10-CM | POA: Diagnosis not present

## 2020-01-20 DIAGNOSIS — F1421 Cocaine dependence, in remission: Secondary | ICD-10-CM

## 2020-01-20 MED ORDER — AMITRIPTYLINE HCL 25 MG PO TABS: 25.0000 mg | ORAL_TABLET | Freq: Every day | ORAL | 0 refills | Status: DC

## 2020-01-20 NOTE — Patient Instructions (Signed)
Follow-up in clinic on April 28 at 4:45 PM

## 2020-01-20 NOTE — Progress Notes (Signed)
Provider Location : [facility] Patient Location : Home  Virtual Visit via Video Note  I connected with Johnny Villa on 01/20/20 at  3:30 PM EDT by a video enabled telemedicine application and verified that I am speaking with the correct person using two identifiers.   I discussed the limitations of evaluation and management by telemedicine and the availability of in person appointments. The patient expressed understanding and agreed to proceed.    I discussed the assessment and treatment plan with the patient. The patient was provided an opportunity to ask questions and all were answered. The patient agreed with the plan and demonstrated an understanding of the instructions.   The patient was advised to call back or seek an in-person evaluation if the symptoms worsen or if the condition fails to improve as anticipated.    [place] MD OP Progress Note  01/20/2020 3:25 PM Johnny Villa  MRN:  [id number]  Chief Complaint:  Chief Complaint    Follow-up     HPI: Johnny Villa is a 44 year old male, currently lives in [place], disabled, has a history of PTSD, bipolar disorder, panic attacks, alcohol use disorder, migraine headaches, cocaine use disorder in remission was evaluated by telemedicine today.  Patient today reports he is currently struggling with a lot of pain.  He reports he is trying to manage his pain by doing yoga and being physically active.  He reports he is also following up with Dr. Holley Raring who has been very helpful.  Patient reports he continues to struggle with sleep.  He takes combination of Ambien and melatonin on and off which helps.  However there are weeks when he cannot sleep even with this combination .  He is currently going through that phase again.  He does report pain also keeps him up at night.  Patient otherwise reports his mood symptoms are stable.  He currently denies any racing thoughts.  He denies any anxiety symptoms except in social situations.  He reports he is  able to better manage his social anxiety.  He reports appetite is good.  Patient denies any suicidality, homicidality or perceptual disturbances.  Patient denies any other concerns today.   Visit Diagnosis:    ICD-10-CM   1. PTSD (post-traumatic stress disorder)  F43.10   2. Bipolar disorder, current episode depressed, moderate ([place])  F31.32   3. Insomnia due to mental condition  F51.05 amitriptyline (ELAVIL) 25 MG tablet  4. Alcohol use disorder, moderate, in early remission ([place])  F10.21   5. Cocaine use disorder, moderate, in sustained remission ([place])  F14.21     Past Psychiatric History: I have reviewed past psychiatric history from my progress note on 05/04/2019.  Past trials of Klonopin, Lexapro, Rexulti, Wellbutrin, Lamictal, lithium, BuSpar, Vraylar  Past Medical History:  Past Medical History:  Diagnosis Date  . Bipolar disorder [facility])    psychiatrist in [place]  . Blood in stool   . Depression   . Drug abuse in remission [facility])    sober for 6 years  . Fatty liver 01/2018  . H/O alcohol abuse   . Headache    migraines  . Hypertension   . Low testosterone in male     Past Surgical History:  Procedure Laterality Date  . APPENDECTOMY     2006  . COLONOSCOPY WITH PROPOFOL N/A 12/30/2017   Procedure: COLONOSCOPY WITH PROPOFOL;  Surgeon: Lin Landsman, MD;  Location: [facility] ENDOSCOPY;  Service: Gastroenterology;  Laterality: N/A;  . ESOPHAGOGASTRODUODENOSCOPY (EGD) WITH PROPOFOL N/A 12/30/2017  Procedure: ESOPHAGOGASTRODUODENOSCOPY (EGD) WITH PROPOFOL;  Surgeon: Lin Landsman, MD;  Location: [facility] ENDOSCOPY;  Service: Gastroenterology;  Laterality: N/A;    Family Psychiatric History: I have reviewed family psychiatric history from my progress note on 05/04/2019  Family History:  Family History  Problem Relation Age of Onset  . Depression Mother   . Alcohol abuse Father   . Cancer Father        prostate dx'ed 49  . COPD Father   . Hyperlipidemia Father   .  Hypertension Father     Social History: Reviewed social history from my progress note on 05/04/2019 Social History   Socioeconomic History  . Marital status: Single    Spouse name: Not on file  . Number of children: Not on file  . Years of education: Not on file  . Highest education level: Not on file  Occupational History  . Occupation: disabled  Tobacco Use  . Smoking status: Former Smoker    Packs/day: 0.25    Years: 2.00    Pack years: 0.50    Types: Cigarettes    Quit date: 1999    Years since quitting: 22.3  . Smokeless tobacco: Former Systems developer    Types: Chew  Substance and Sexual Activity  . Alcohol use: Not Currently    Comment: former quit 03/14/2012  . Drug use: Not Currently    Comment: former quit cocaine 03/14/2012   . Sexual activity: Yes  Other Topics Concern  . 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 Difficulty of Paying Living Expenses:   Food Insecurity:   . Worried About Charity fundraiser in the Last Year:   . Arboriculturist in the Last Year:   Transportation Needs:   . Film/video editor (Medical):   Marland Kitchen Lack of Transportation (Non-Medical):   Physical Activity:   . Days of Exercise per Week:   . Minutes of Exercise per Session:   Stress:   . Feeling of Stress :   Social Connections:   . Frequency of Communication with Friends and Family:   . Frequency of Social Gatherings with Friends and Family:   . Attends Religious Services:   . Active Member of Clubs or Organizations:   . Attends Archivist Meetings:   Marland Kitchen Marital Status:     Allergies: No Known Allergies  Metabolic Disorder Labs: Lab Results  Component Value Date   HGBA1C 5.6 09/13/2019   MPG 114.02 09/13/2019   No results found for: PROLACTIN Lab Results  Component Value Date   CHOL 194 08/10/2019   TRIG  60.0 08/10/2019   HDL 56.20 08/10/2019   CHOLHDL 3 08/10/2019   VLDL 12.0 08/10/2019   LDLCALC 126 (H) 08/10/2019   LDLCALC 114 (H) 10/25/2017   Lab Results  Component Value Date   TSH 1.76 08/10/2019   TSH 0.97 06/23/2018    Therapeutic Level Labs: Lab Results  Component Value Date   LITHIUM 0.4 (L) 06/23/2018   LITHIUM 0.8 10/25/2017   No results found for: VALPROATE No components found for:  CBMZ  Current Medications: Current Outpatient Medications  Medication Sig Dispense Refill  . amitriptyline (ELAVIL) 25 MG tablet Take 1-2 tablets (25-50 mg total) by mouth at bedtime.  Start taking 1 tablet for a week and increase to 2 tablets 60 tablet 0  . buPROPion (WELLBUTRIN XL) 300 MG 24 hr tablet Take 1 tablet (300 mg total) by mouth daily. 90 tablet 0  . busPIRone (BUSPAR) 30 MG tablet Take 1 tablet (30 mg total) by mouth 2 (two) times daily. 180 tablet 1  . clomiPHENE (CLOMID) 50 MG tablet Take by mouth.    . gabapentin (NEURONTIN) 300 MG capsule Take 1 capsule (300 mg total) by mouth at bedtime for 14 days, THEN 1 capsule (300 mg total) 2 (two) times daily for 26 days. 66 capsule 0  . hydrOXYzine (ATARAX/VISTARIL) 25 MG tablet Take 1 tablet (25 mg total) by mouth every 6 (six) hours as needed for anxiety. 90 tablet 1  . lamoTRIgine (LAMICTAL) 200 MG tablet Take 1 tablet (200 mg total) by mouth daily. 90 tablet 1  . ondansetron (ZOFRAN-ODT) 4 MG disintegrating tablet     . QUEtiapine (SEROQUEL) 50 MG tablet Take 1 tablet (50 mg total) by mouth daily as needed. For severe anxiety agitation 30 tablet 1  . SUMAtriptan (IMITREX) 100 MG tablet      No current facility-administered medications for this visit.     Musculoskeletal: Strength & Muscle Tone: UTA Gait & Station: normal Patient leans: N/A  Psychiatric Specialty Exam: Review of Systems  Musculoskeletal: Positive for back pain.  Psychiatric/Behavioral: Positive for sleep disturbance.  All other systems reviewed and  are negative.   There were no vitals taken for this visit.There is no height or weight on file to calculate BMI.  General Appearance: Casual  Eye Contact:  Good  Speech:  Normal Rate  Volume:  Normal  Mood:  Euthymic  Affect:  Appropriate  Thought Process:  Goal Directed and Descriptions of Associations: Intact  Orientation:  Full (Time, Place, and Person)  Thought Content: Logical   Suicidal Thoughts:  No  Homicidal Thoughts:  No  Memory:  Immediate;   Fair Recent;   Fair Remote;   Fair  Judgement:  Fair  Insight:  Fair  Psychomotor Activity:  Normal  Concentration:  Concentration: Fair and Attention Span: Fair  Recall:  [employer] of Knowledge: Fair  Language: Fair  Akathisia:  No  Handed:  Right  AIMS (if indicated): UTA  Assets:  Communication Skills Desire for Improvement Housing Social Support  ADL's:  Intact  Cognition: WNL  Sleep:  Poor   Screenings: AIMS     Admission (Discharged) from 09/09/2019 in [place] Total Score  0    AUDIT     Admission (Discharged) from 09/09/2019 in [place] Admission (Discharged) from 06/01/2019 in [place]  Alcohol Use Disorder Identification Test Final Score (AUDIT)  40  33    PHQ2-9     Office Visit from 07/09/2019 in [place] from 06/26/2019 in [facility] Office Visit from 06/24/2018 in [place] Office Visit from 06/19/2018 in [place] Procedure visit from 05/19/2018 in [place]  PHQ-2 Total Score  2  0  0  0  0  PHQ-9 Total Score  4  -  -  -  -       Assessment and Plan: Terah is a 44 year old male, divorced, lives in [place], has a history of PTSD, panic attacks, bipolar disorder, alcohol and cocaine use disorder, migraine headaches was  evaluated by  telemedicine today.  Patient is biologically predisposed given his history of substance abuse problems in the past as well as history of trauma.  Patient is currently struggling with sleep also because of his pain.  However will make the following medication changes.  Plan as noted below.  Plan PTSD-improving Taper of Lexapro.  Patient advised to take 5 mg for the next 3 days and stop taking it. BuSpar 30 mg p.o. twice daily Discontinue Ambien.  Start Elavil 25 mg p.o. nightly for a week and then increase to 50 mg at bedtime.  Provided medication education.  Elavil will help him with his sleep as well as pain and also with mood. Seroquel 50 mg p.o. daily as needed for severe racing thoughts/anxiety  Bipolar disorder current episode depressed-improving Lamictal 200 mg p.o. daily Wellbutrin XL 300 mg p.o. daily   Alcohol use disorder in early remission Continue AA meetings.  Cocaine use disorder in remission Patient continues to stay sober  Follow-up in clinic in 1 weeks or sooner if needed.  I have spent atleast 20 minutes non face to face with patient today. More than 50 % of the time was spent for preparing to see the patient ( e.g., review of test, records ),  ordering medications and test ,psychoeducation and supportive psychotherapy and care coordination,as well as documenting clinical information in electronic health record. This note was generated in part or whole with voice recognition software. Voice recognition is usually quite accurate but there are transcription errors that can and very often do occur. I apologize for any typographical errors that were not detected and corrected.         Ursula Alert, MD 01/20/2020, 3:25 PM

## 2020-01-27 ENCOUNTER — Telehealth (INDEPENDENT_AMBULATORY_CARE_PROVIDER_SITE_OTHER): Payer: Medicare PPO | Admitting: Psychiatry

## 2020-01-27 ENCOUNTER — Encounter: Payer: Self-pay | Admitting: Psychiatry

## 2020-01-27 ENCOUNTER — Other Ambulatory Visit: Payer: Self-pay

## 2020-01-27 ENCOUNTER — Other Ambulatory Visit: Payer: Medicare PPO

## 2020-01-27 DIAGNOSIS — F3132 Bipolar disorder, current episode depressed, moderate: Secondary | ICD-10-CM

## 2020-01-27 DIAGNOSIS — F1421 Cocaine dependence, in remission: Secondary | ICD-10-CM

## 2020-01-27 DIAGNOSIS — F5105 Insomnia due to other mental disorder: Secondary | ICD-10-CM | POA: Diagnosis not present

## 2020-01-27 DIAGNOSIS — F1021 Alcohol dependence, in remission: Secondary | ICD-10-CM | POA: Diagnosis not present

## 2020-01-27 DIAGNOSIS — F4310 Post-traumatic stress disorder, unspecified: Secondary | ICD-10-CM

## 2020-01-27 MED ORDER — HYDROXYZINE PAMOATE 50 MG PO CAPS: 50.0000 mg | ORAL_CAPSULE | Freq: Every evening | ORAL | 1 refills | Status: DC | PRN

## 2020-01-27 MED ORDER — AMITRIPTYLINE HCL 100 MG PO TABS: 100.0000 mg | ORAL_TABLET | Freq: Every day | ORAL | 1 refills | Status: DC

## 2020-01-27 NOTE — Progress Notes (Signed)
Provider Location : [facility] Patient Location : Family Home  Virtual Visit via Video Note  I connected with Johnny Villa on 01/27/20 at  4:45 PM EDT by a video enabled telemedicine application and verified that I am speaking with the correct person using two identifiers.   I discussed the limitations of evaluation and management by telemedicine and the availability of in person appointments. The patient expressed understanding and agreed to proceed.   I discussed the assessment and treatment plan with the patient. The patient was provided an opportunity to ask questions and all were answered. The patient agreed with the plan and demonstrated an understanding of the instructions.   The patient was advised to call back or seek an in-person evaluation if the symptoms worsen or if the condition fails to improve as anticipated.   Stacey [facility] MD OP Progress Note  01/27/2020 5:16 PM Johnny Villa  MRN:  [id number]  Chief Complaint:  Chief Complaint    Follow-up     HPI: Johnny Villa is a 44 year old male, currently lives in [place] disabled, has a history of PTSD, bipolar disorder, panic attacks, alcohol use disorder in early remission, migraine headaches, cocaine use disorder in remission, chronic back pain was evaluated by telemedicine today.  Patient today reports he is currently visiting his father who had a fall and has been hospitalized.  He reports his father has multiple injuries and had surgery done.  Patient became tearful when he discussed his father's condition.  He reports he and his brothers are currently trying to support his father.  Patient reports the recent medication changes has not helped much with his sleep.  He may be sleeping 30 minutes more than what he was doing before.  He however reports he does understand that he currently has psychosocial stressors of his father's health issue.  Patient however is interested in increasing his medication dosage.  He reports his back pain has  improved and that is a relief.  He continues to make use of for his support system.  He has been attending AA meetings on a regular basis which helps.  Patient denies any suicidality, homicidality or perceptual disturbances.  Patient denies any other concerns today.  Visit Diagnosis:    ICD-10-CM   1. PTSD (post-traumatic stress disorder)  F43.10 amitriptyline (ELAVIL) 100 MG tablet  2. Bipolar disorder, current episode depressed, moderate ([name])  F31.32 amitriptyline (ELAVIL) 100 MG tablet  3. Insomnia due to mental condition  F51.05 amitriptyline (ELAVIL) 100 MG tablet    hydrOXYzine (VISTARIL) 50 MG capsule  4. Alcohol use disorder, moderate, in early remission ([place])  F10.21   5. Cocaine use disorder, moderate, in sustained remission ([place])  F14.21     Past Psychiatric History: I have reviewed past psychiatric history from my progress note on 05/04/2019.  Past trials of Klonopin, Lexapro, Rexulti, Wellbutrin, Lamictal, lithium, BuSpar, Vraylar.   Past Medical History:  Past Medical History:  Diagnosis Date  . Bipolar disorder [facility])    psychiatrist in [place]  . Blood in stool   . Depression   . Drug abuse in remission [facility])    sober for 6 years  . Fatty liver 01/2018  . H/O alcohol abuse   . Headache    migraines  . Hypertension   . Low testosterone in male     Past Surgical History:  Procedure Laterality Date  . APPENDECTOMY     2006  . COLONOSCOPY WITH PROPOFOL N/A 12/30/2017   Procedure: COLONOSCOPY WITH PROPOFOL;  Surgeon:  Lin Landsman, MD;  Location: [facility] ENDOSCOPY;  Service: Gastroenterology;  Laterality: N/A;  . ESOPHAGOGASTRODUODENOSCOPY (EGD) WITH PROPOFOL N/A 12/30/2017   Procedure: ESOPHAGOGASTRODUODENOSCOPY (EGD) WITH PROPOFOL;  Surgeon: Lin Landsman, MD;  Location: [facility] ENDOSCOPY;  Service: Gastroenterology;  Laterality: N/A;    Family Psychiatric History: I have reviewed family psychiatric history from my progress note on 05/04/2019.  Family  History:  Family History  Problem Relation Age of Onset  . Depression Mother   . Alcohol abuse Father   . Cancer Father        prostate dx'ed 53  . COPD Father   . Hyperlipidemia Father   . Hypertension Father     Social History: I have reviewed social history from my progress note on 05/04/2019 Social History   Socioeconomic History  . Marital status: Single    Spouse name: Not on file  . Number of children: Not on file  . Years of education: Not on file  . Highest education level: Not on file  Occupational History  . Occupation: disabled  Tobacco Use  . Smoking status: Former Smoker    Packs/day: 0.25    Years: 2.00    Pack years: 0.50    Types: Cigarettes    Quit date: 1999    Years since quitting: 22.3  . Smokeless tobacco: Former Systems developer    Types: Chew  Substance and Sexual Activity  . Alcohol use: Not Currently    Comment: former quit 03/14/2012  . Drug use: Not Currently    Comment: former quit cocaine 03/14/2012   . Sexual activity: Yes  Other Topics Concern  . 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 Difficulty of Paying Living Expenses:   Food Insecurity:   . Worried About Charity fundraiser in the Last Year:   . Arboriculturist in the Last Year:   Transportation Needs:   . Film/video editor (Medical):   Marland Kitchen Lack of Transportation (Non-Medical):   Physical Activity:   . Days of Exercise per Week:   . Minutes of Exercise per Session:   Stress:   . Feeling of Stress :   Social Connections:   . Frequency of Communication with Friends and Family:   . Frequency of Social Gatherings with Friends and Family:   . Attends Religious Services:   . Active Member of Clubs or Organizations:   . Attends Archivist Meetings:   Marland Kitchen Marital Status:     Allergies: No Known Allergies  Metabolic  Disorder Labs: Lab Results  Component Value Date   HGBA1C 5.6 09/13/2019   MPG 114.02 09/13/2019   No results found for: PROLACTIN Lab Results  Component Value Date   CHOL 194 08/10/2019   TRIG 60.0 08/10/2019   HDL 56.20 08/10/2019   CHOLHDL 3 08/10/2019   VLDL 12.0 08/10/2019   LDLCALC 126 (H) 08/10/2019   LDLCALC 114 (H) 10/25/2017   Lab Results  Component Value Date   TSH 1.76 08/10/2019   TSH 0.97 06/23/2018    Therapeutic Level Labs: Lab Results  Component Value Date   LITHIUM 0.4 (L) 06/23/2018   LITHIUM 0.8 10/25/2017   No results found for: VALPROATE No components found for:  CBMZ  Current  Medications: Current Outpatient Medications  Medication Sig Dispense Refill  . amitriptyline (ELAVIL) 100 MG tablet Take 1 tablet (100 mg total) by mouth at bedtime. 30 tablet 1  . buPROPion (WELLBUTRIN XL) 300 MG 24 hr tablet TAKE ONE TABLET BY MOUTH DAILY 90 tablet 0  . busPIRone (BUSPAR) 30 MG tablet Take 1 tablet (30 mg total) by mouth 2 (two) times daily. 180 tablet 1  . clomiPHENE (CLOMID) 50 MG tablet Take by mouth.    . gabapentin (NEURONTIN) 300 MG capsule Take 1 capsule (300 mg total) by mouth at bedtime for 14 days, THEN 1 capsule (300 mg total) 2 (two) times daily for 26 days. 66 capsule 0  . hydrOXYzine (ATARAX/VISTARIL) 25 MG tablet Take 1 tablet (25 mg total) by mouth every 6 (six) hours as needed for anxiety. 90 tablet 1  . hydrOXYzine (VISTARIL) 50 MG capsule Take 1 capsule (50 mg total) by mouth at bedtime as needed. For sleep 30 capsule 1  . lamoTRIgine (LAMICTAL) 200 MG tablet Take 1 tablet (200 mg total) by mouth daily. 90 tablet 1  . ondansetron (ZOFRAN-ODT) 4 MG disintegrating tablet     . QUEtiapine (SEROQUEL) 50 MG tablet Take 1 tablet (50 mg total) by mouth daily as needed. For severe anxiety agitation 30 tablet 1  . SUMAtriptan (IMITREX) 100 MG tablet      No current facility-administered medications for this visit.      Musculoskeletal: Strength & Muscle Tone: UTA Gait & Station: normal Patient leans: N/A  Psychiatric Specialty Exam: Review of Systems  Musculoskeletal: Positive for back pain.  Psychiatric/Behavioral: Positive for sleep disturbance. The patient is nervous/anxious.   All other systems reviewed and are negative.   There were no vitals taken for this visit.There is no height or weight on file to calculate BMI.  General Appearance: Casual  Eye Contact:  Fair  Speech:  Clear and Coherent  Volume:  Normal  Mood:  Anxious  Affect:  Congruent  Thought Process:  Goal Directed and Descriptions of Associations: Intact  Orientation:  Full (Time, Place, and Person)  Thought Content: Logical   Suicidal Thoughts:  No  Homicidal Thoughts:  No  Memory:  Immediate;   Fair Recent;   Fair Remote;   Fair  Judgement:  Fair  Insight:  Fair  Psychomotor Activity:  Normal  Concentration:  Concentration: Fair and Attention Span: Fair  Recall:  [employer] of Knowledge: Fair  Language: Fair  Akathisia:  No  Handed:  Right  AIMS (if indicated): UTA  Assets:  Communication Skills Desire for Improvement Housing Social Support  ADL's:  Intact  Cognition: WNL  Sleep:  improving   Screenings: AIMS     Admission (Discharged) from 09/09/2019 in [place] Total Score  0    AUDIT     Admission (Discharged) from 09/09/2019 in [place] Admission (Discharged) from 06/01/2019 in [place]  Alcohol Use Disorder Identification Test Final Score (AUDIT)  40  33    PHQ2-9     Office Visit from 07/09/2019 in [place] from 06/26/2019 in [place] Office Visit from 06/24/2018 in [place] Office Visit from 06/19/2018 in [place] Procedure visit from 05/19/2018 in [place]  PHQ-2 Total Score  2  0  0  0  0  PHQ-9 Total Score  4  --  --  --  --  Assessment and Plan: Jaggar is a 44 year old male, divorced, lives in [place], has a history of PTSD, panic attacks, bipolar disorder, alcohol and cocaine use disorder, migraine headache was evaluated by telemedicine today.  Patient is biologically predisposed given his history of substance abuse problems in the past as well as history of trauma.  Patient is currently struggling with psychosocial stressors of his father's health issues.  Patient will continue to benefit from medication readjustment for his mood and sleep.  Plan as noted below.  Plan PTSD-improving BuSpar 30 mg p.o. twice daily Increase Elavil to 100 mg p.o. nightly. Add hydroxyzine 50 mg p.o. nightly as needed Seroquel 50 mg p.o. daily as needed for severe racing thoughts/anxiety.  Bipolar disorder current episode depressed-improving Lamictal 200 mg p.o. daily Wellbutrin XL 300 mg p.o. daily  Alcohol use disorder in remission Continue AA meetings  Cocaine use disorder in remission Patient continues to remain sober  Follow-up in clinic in 3 weeks or sooner if needed.  I have spent atleast 20 minutes non face to face with patient today. More than 50 % of the time was spent for preparing to see the patient ( e.g., review of test, records ), ordering medications and test ,psychoeducation and supportive psychotherapy and care coordination,as well as documenting clinical information in electronic health record. This note was generated in part or whole with voice recognition software. Voice recognition is usually quite accurate but there are transcription errors that can and very often do occur. I apologize for any typographical errors that were not detected and corrected.       Ursula Alert, MD 01/27/2020, 5:16 PM

## 2020-02-02 ENCOUNTER — Encounter: Payer: Self-pay | Admitting: Internal Medicine

## 2020-02-02 ENCOUNTER — Telehealth (INDEPENDENT_AMBULATORY_CARE_PROVIDER_SITE_OTHER): Payer: Medicare PPO | Admitting: Internal Medicine

## 2020-02-02 VITALS — Ht 71.0 in | Wt 210.0 lb

## 2020-02-02 DIAGNOSIS — R7989 Other specified abnormal findings of blood chemistry: Secondary | ICD-10-CM | POA: Diagnosis not present

## 2020-02-02 DIAGNOSIS — M542 Cervicalgia: Secondary | ICD-10-CM

## 2020-02-02 DIAGNOSIS — F3130 Bipolar disorder, current episode depressed, mild or moderate severity, unspecified: Secondary | ICD-10-CM | POA: Diagnosis not present

## 2020-02-02 DIAGNOSIS — N529 Male erectile dysfunction, unspecified: Secondary | ICD-10-CM | POA: Diagnosis not present

## 2020-02-02 DIAGNOSIS — Q742 Other congenital malformations of lower limb(s), including pelvic girdle: Secondary | ICD-10-CM

## 2020-02-02 DIAGNOSIS — T452X4D Poisoning by vitamins, undetermined, subsequent encounter: Secondary | ICD-10-CM | POA: Diagnosis not present

## 2020-02-02 DIAGNOSIS — E291 Testicular hypofunction: Secondary | ICD-10-CM

## 2020-02-02 DIAGNOSIS — M47812 Spondylosis without myelopathy or radiculopathy, cervical region: Secondary | ICD-10-CM | POA: Diagnosis not present

## 2020-02-02 DIAGNOSIS — M5416 Radiculopathy, lumbar region: Secondary | ICD-10-CM

## 2020-02-02 DIAGNOSIS — R635 Abnormal weight gain: Secondary | ICD-10-CM

## 2020-02-02 DIAGNOSIS — M5030 Other cervical disc degeneration, unspecified cervical region: Secondary | ICD-10-CM | POA: Diagnosis not present

## 2020-02-02 DIAGNOSIS — Z1389 Encounter for screening for other disorder: Secondary | ICD-10-CM

## 2020-02-02 DIAGNOSIS — R739 Hyperglycemia, unspecified: Secondary | ICD-10-CM

## 2020-02-02 DIAGNOSIS — Z0000 Encounter for general adult medical examination without abnormal findings: Secondary | ICD-10-CM

## 2020-02-02 DIAGNOSIS — E785 Hyperlipidemia, unspecified: Secondary | ICD-10-CM

## 2020-02-02 DIAGNOSIS — Z13220 Encounter for screening for lipoid disorders: Secondary | ICD-10-CM

## 2020-02-02 NOTE — Progress Notes (Signed)
Virtual Visit via Video Note  I connected with Johnny Villa  on 02/02/20 at  9:47 AM EDT by a video enabled telemedicine application and verified that I am speaking with the correct person using two identifiers.  Location patient: car Location provider:work or home office Persons participating in the virtual visit: patient, provider  I discussed the limitations of evaluation and management by telemedicine and the availability of in person appointments. The patient expressed understanding and agreed to proceed.   HPI: 1. Bipolar d/o f/u Dr. Shea Evans due for fasting labs he wants cortisol done due to increased weight gain and he is eating right and exercising  2 vit D excess will repeat vitamin D level  3. Currently in [place] due to 20 y.o dad fell off ladder and has brain bleed and injuries and going to SNF and he needs to help  4. Chronic neck and low back pain and nerve pain neck and back and f/u pain clinic Dr. Holley Raring   5. Hypogonadism on clomid due to f/u labs Dr. Ladell Pier PSA, [place], LH, testosterone, CBC 06/01/20 made pt aware  6. Pt wants referral to podiatry left foot larger in size than right and causes issues running wants orthotics     ROS: See pertinent positives and negatives per HPI.  Past Medical History:  Diagnosis Date  . Bipolar disorder [facility])    psychiatrist in [place]  . Blood in stool   . Depression   . Drug abuse in remission [facility])    sober for 6 years  . Fatty liver 01/2018  . H/O alcohol abuse   . Headache    migraines  . Hypertension   . Low testosterone in male     Past Surgical History:  Procedure Laterality Date  . APPENDECTOMY     2006  . COLONOSCOPY WITH PROPOFOL N/A 12/30/2017   Procedure: COLONOSCOPY WITH PROPOFOL;  Surgeon: Lin Landsman, MD;  Location: [facility] ENDOSCOPY;  Service: Gastroenterology;  Laterality: N/A;  . ESOPHAGOGASTRODUODENOSCOPY (EGD) WITH PROPOFOL N/A 12/30/2017   Procedure: ESOPHAGOGASTRODUODENOSCOPY (EGD) WITH  PROPOFOL;  Surgeon: Lin Landsman, MD;  Location: [facility] ENDOSCOPY;  Service: Gastroenterology;  Laterality: N/A;    Family History  Problem Relation Age of Onset  . Depression Mother   . Alcohol abuse Father   . Cancer Father        prostate dx'ed 42  . COPD Father   . Hyperlipidemia Father   . Hypertension Father     SOCIAL HX: lives in [place]    Current Outpatient Medications:  .  amitriptyline (ELAVIL) 100 MG tablet, Take 1 tablet (100 mg total) by mouth at bedtime., Disp: 30 tablet, Rfl: 1 .  buPROPion (WELLBUTRIN XL) 300 MG 24 hr tablet, TAKE ONE TABLET BY MOUTH DAILY, Disp: 90 tablet, Rfl: 0 .  busPIRone (BUSPAR) 30 MG tablet, Take 1 tablet (30 mg total) by mouth 2 (two) times daily., Disp: 180 tablet, Rfl: 1 .  clomiPHENE (CLOMID) 50 MG tablet, Take by mouth., Disp: , Rfl:  .  gabapentin (NEURONTIN) 300 MG capsule, Take 1 capsule (300 mg total) by mouth at bedtime for 14 days, THEN 1 capsule (300 mg total) 2 (two) times daily for 26 days., Disp: 66 capsule, Rfl: 0 .  hydrOXYzine (VISTARIL) 50 MG capsule, Take 1 capsule (50 mg total) by mouth at bedtime as needed. For sleep, Disp: 30 capsule, Rfl: 1 .  lamoTRIgine (LAMICTAL) 200 MG tablet, Take 1 tablet (200  mg total) by mouth daily., Disp: 90 tablet, Rfl: 1 .  ondansetron (ZOFRAN-ODT) 4 MG disintegrating tablet, , Disp: , Rfl:  .  QUEtiapine (SEROQUEL) 50 MG tablet, Take 1 tablet (50 mg total) by mouth daily as needed. For severe anxiety agitation, Disp: 30 tablet, Rfl: 1 .  SUMAtriptan (IMITREX) 100 MG tablet, , Disp: , Rfl:   EXAM:  VITALS per patient if applicable:  GENERAL: alert, oriented, appears well and in no acute distress  HEENT: atraumatic, conjunttiva clear, no obvious abnormalities on inspection of external nose and ears  NECK: normal movements of the head and neck  LUNGS: on inspection no signs of respiratory distress, breathing rate appears normal, no obvious gross SOB, gasping or  wheezing  CV: no obvious cyanosis  MS: moves all visible extremities without noticeable abnormality  PSYCH/NEURO: pleasant and cooperative, no obvious depression or anxiety, speech and thought processing grossly intact  ASSESSMENT AND PLAN:  Discussed the following assessment and plan:  Hypogonadism in male Low testosterone Erectile dysfunction, unspecified erectile dysfunction type Labs PSA, FSH/LH, testosterone, CBC 06/01/20 f/u 06/2020 [facility] endocrine   Bipolar I disorder, most recent episode depressed ([place]) F/u Dr. Shea Evans  Cervicalgia Cervical spondylosis without myelopathy (C4,5,6,7) DDD (degenerative disc disease), cervical Lumbar radicular pain -f/u pain clinic   Vitamin D overdose, undetermined intent, subsequent encounter  Congenital foot abnormality - Plan: Ambulatory referral to Podiatry pt wants orthotics   HM Fasting labs 42/17/21  Flu shotutd Tdap utd  Declines MMR lab check  covid 2/2 had  HIV neg 05/24/17 PSA, CBC, FSH, LH, test 06/01/20 with endocrine 06/01/20 [facility] endocrine -we discussed possible serious and likely etiologies, options for evaluation and workup, limitations of telemedicine visit vs in person visit, treatment, treatment risks and precautions. Pt prefers to treat via telemedicine empirically rather then risking or undertaking an in person visit at this moment. Patient agrees to seek prompt in person care if worsening, new symptoms arise, or if is not improving with treatment.   I discussed the assessment and treatment plan with the patient. The patient was provided an opportunity to ask questions and all were answered. The patient agreed with the plan and demonstrated an understanding of the instructions.   The patient was advised to call back or seek an in-person evaluation if the symptoms worsen or if the condition fails to improve as anticipated.  Time 20 min Delorise Jackson, MD

## 2020-02-03 ENCOUNTER — Ambulatory Visit: Payer: Self-pay

## 2020-02-16 ENCOUNTER — Telehealth: Payer: Self-pay | Admitting: *Deleted

## 2020-02-16 ENCOUNTER — Encounter: Payer: Self-pay | Admitting: Student in an Organized Health Care Education/Training Program

## 2020-02-16 NOTE — Telephone Encounter (Signed)
Attempted to call for pre appointment review of allergies/meds. Message left. 

## 2020-02-17 ENCOUNTER — Other Ambulatory Visit: Payer: Self-pay

## 2020-02-17 ENCOUNTER — Ambulatory Visit
Payer: Medicare PPO | Attending: Student in an Organized Health Care Education/Training Program | Admitting: Student in an Organized Health Care Education/Training Program

## 2020-02-17 ENCOUNTER — Encounter: Payer: Self-pay | Admitting: Student in an Organized Health Care Education/Training Program

## 2020-02-17 DIAGNOSIS — G894 Chronic pain syndrome: Secondary | ICD-10-CM | POA: Diagnosis not present

## 2020-02-17 DIAGNOSIS — M5416 Radiculopathy, lumbar region: Secondary | ICD-10-CM

## 2020-02-17 MED ORDER — GABAPENTIN 600 MG PO TABS: 600.0000 mg | ORAL_TABLET | Freq: Two times a day (BID) | ORAL | 2 refills | Status: DC

## 2020-02-17 NOTE — Progress Notes (Signed)
Patient: Johnny Villa  Service Category: E/M  Provider: Gillis Santa, MD  DOB: June 15, 1976  DOS: 02/17/2020  Location: Office  MRN: [id number]  Setting: Ambulatory outpatient  Referring Provider: McLean-Scocuzza, Olivia Mackie *  Type: Established Patient  Specialty: Interventional Pain Management  PCP: McLean-Scocuzza, Nino Glow, MD  Location: Home  Delivery: TeleHealth     Virtual Encounter - Pain Management PROVIDER NOTE: Information contained herein reflects review and annotations entered in association with encounter. Interpretation of such information and data should be left to medically-trained personnel. Information provided to patient can be located elsewhere in the medical record under "Patient Instructions". Document created using STT-dictation technology, any transcriptional errors that may result from process are unintentional.    Contact & Pharmacy Preferred: [phone] Home: [phone] (home) Mobile: [phone] (mobile) E-mail: [email or web address] [place] [address] Phone: [phone] Fax: [phone]   Pre-screening  Johnny Villa offered "in-person" vs "virtual" encounter. He indicated preferring virtual for this encounter.   Reason COVID-19*  Social distancing based on CDC and AMA recommendations.   I contacted Johnny Villa on 02/17/2020 via video conference.      I clearly identified myself as Gillis Santa, MD. I verified that I was speaking with the correct person using two identifiers (Name: Niel Peretti, and date of birth: 09/29/1976).  Consent I sought verbal advanced consent from Johnny Villa for virtual visit interactions. I informed Johnny Villa of possible security and privacy concerns, risks, and limitations associated with providing "not-in-person" medical evaluation and management services. I also informed Johnny Villa of the availability of "in-person"  appointments. Finally, I informed him that there would be a charge for the virtual visit and that he could be  personally, fully or partially, financially responsible for it. Johnny Villa expressed understanding and agreed to proceed.   Historic Elements   Johnny Villa is a 44 y.o. year old, male patient evaluated today after his last contact with our practice on 02/16/2020. Johnny Villa  has a past medical history of Bipolar disorder ([place]), Blood in stool, Depression, Drug abuse in remission [facility]), Fatty liver (01/2018), H/O alcohol abuse, Headache, Hypertension, and Low testosterone in male. He also  has a past surgical history that includes Appendectomy; Colonoscopy with propofol (N/A, 12/30/2017); and Esophagogastroduodenoscopy (egd) with propofol (N/A, 12/30/2017). Johnny Villa has a current medication list which includes the following prescription(s): amitriptyline, bupropion, buspirone, clomiphene, hydroxyzine, lamotrigine, ondansetron, quetiapine, sumatriptan, and gabapentin. He  reports that he quit smoking about 22 years ago. His smoking use included cigarettes. He has a 0.50 pack-year smoking history. He has quit using smokeless tobacco.  His smokeless tobacco use included chew. He reports previous alcohol use. He reports previous drug use. Johnny Villa has No Known Allergies.   HPI  Today, he is being contacted for a post-procedure assessment.   Evaluation of last interventional procedure  01/11/2020 Procedure:  Type: Diagnostic Inter-Laminar Epidural Steroid Injection  #1  Region: Lumbar Level: L4-5 Level. Laterality: Right-Sided         Sedation: Please see nurses note for DOS. When no sedatives are used, the analgesic levels obtained are directly associated to the effectiveness of the local anesthetics. However, when sedation is provided, the level of analgesia obtained during the initial 1 hour following the intervention, is believed to be the result of a combination of factors. These  factors may include, but are not limited to: 1. The effectiveness of the local anesthetics  used. 2. The effects of the analgesic(s) and/or anxiolytic(s) used. 3. The degree of discomfort experienced by the patient at the time of the procedure. 4. The patients ability and reliability in recalling and recording the events. 5. The presence and influence of possible secondary gains and/or psychosocial factors. Reported result: Relief experienced during the 1st hour after the procedure: 55 % (Ultra-Short Term Relief)            Interpretative annotation: Clinically appropriate result. Analgesia during this period is likely to be Local Anesthetic and/or IV Sedative (Analgesic/Anxiolytic) related.          Effects of local anesthetic: The analgesic effects attained during this period are directly associated to the localized infiltration of local anesthetics and therefore cary significant diagnostic value as to the etiological location, or anatomical origin, of the pain. Expected duration of relief is directly dependent on the pharmacodynamics of the local anesthetic used. Long-acting (4-6 hours) anesthetics used.  Reported result: Relief during the next 4 to 6 hour after the procedure: 55 % (Short-Term Relief)            Interpretative annotation: Clinically appropriate result. Analgesia during this period is likely to be Local Anesthetic-related.          Long-term benefit: Defined as the period of time past the expected duration of local anesthetics (1 hour for short-acting and 4-6 hours for long-acting). With the possible exception of prolonged sympathetic blockade from the local anesthetics, benefits during this period are typically attributed to, or associated with, other factors such as analgesic sensory neuropraxia, antiinflammatory effects, or beneficial biochemical changes provided by agents other than the local anesthetics.  Reported result: Extended relief following procedure: 50 % (Long-Term  Relief)            Interpretative annotation: Clinically appropriate result. Good relief. No permanent benefit expected. Inflammation plays a part in the etiology to the pain.            Laboratory Chemistry Profile   Renal Lab Results  Component Value Date   BUN 13 09/13/2019   CREATININE 0.99 09/13/2019   BCR 14 03/18/2019   GFR 82.16 08/10/2019   GFRAA >60 09/13/2019   GFRNONAA >60 09/13/2019     Hepatic Lab Results  Component Value Date   AST 17 09/13/2019   ALT 19 09/13/2019   ALBUMIN 4.0 09/13/2019   ALKPHOS 38 09/13/2019     Electrolytes Lab Results  Component Value Date   NA 139 09/13/2019   K 4.1 09/13/2019   CL 104 09/13/2019   CALCIUM 9.2 09/13/2019   MG 2.3 09/13/2019   PHOS 3.4 09/13/2019     Bone Lab Results  Component Value Date   VD25OH 115.23 (HH) 08/10/2019   TESTOFREE 114.2 08/10/2019   TESTOSTERONE 595 08/10/2019     Inflammation (CRP: Acute Phase) (ESR: Chronic Phase) Lab Results  Component Value Date   CRP 1.0 10/25/2017   ESRSEDRATE 5 10/25/2017       Note: Above Lab results reviewed.  Imaging  DG PAIN CLINIC C-ARM 1-60 MIN NO REPORT Fluoro was used, but no Radiologist interpretation will be provided.  Please refer to "NOTES" tab for provider progress note.  Assessment  The primary encounter diagnosis was Lumbar radiculopathy. Diagnoses of Lumbar radicular pain and Chronic pain syndrome were also pertinent to this visit.  Plan of Care  Johnny Villa has a current medication list which includes the following long-term medication(s): amitriptyline, bupropion, lamotrigine, quetiapine, and gabapentin.  Increase gabapentin to 600 mg to twice daily, repeat lumbar epidural steroid injection as needed.  Pharmacotherapy (Medications Ordered): Meds ordered this encounter  Medications  . gabapentin (NEURONTIN) 600 MG tablet    Sig: Take 1 tablet (600 mg total) by mouth 2 (two) times daily.    Dispense:  60 tablet    Refill:  2    Orders:  Orders Placed This Encounter  Procedures  . Lumbar Epidural Injection    Standing Status:   Standing    Number of Occurrences:   9    Standing Expiration Date:   08/19/2021    Scheduling Instructions:     Purpose: Palliative     Indication: Lower extremity pain/Sciatica unspecified side (M54.30).     Side: Midline     Level: TBD     Sedation: Patient's choice.     TIMEFRAME: PRN procedure. (Johnny Villa. Sabic will call when needed.)    Order Specific Question:   Where will this procedure be performed?    Answer:   [facility]   Follow-up plan:   No follow-ups on file.     Status post right C4, C5, C6, C7 RFA on 05/19/2018 for cervical spondylosis.  Status post right L4-L5 ESI #1 on 01/11/2020, repeat as needed    Recent Visits Date Type Provider Dept  01/11/20 Procedure visit Gillis Santa, MD [facility]  Showing recent visits within past 90 days and meeting all other requirements   Future Appointments No visits were found meeting these conditions.  Showing future appointments within next 90 days and meeting all other requirements   I discussed the assessment and treatment plan with the patient. The patient was provided an opportunity to ask questions and all were answered. The patient agreed with the plan and demonstrated an understanding of the instructions.  Patient advised to call back or seek an in-person evaluation if the symptoms or condition worsens.  Duration of encounter: 25 minutes.  Note by: Gillis Santa, MD Date: 02/17/2020; Time: 3:03 PM

## 2020-02-18 DIAGNOSIS — G8929 Other chronic pain: Secondary | ICD-10-CM | POA: Diagnosis not present

## 2020-02-18 DIAGNOSIS — M25571 Pain in right ankle and joints of right foot: Secondary | ICD-10-CM | POA: Diagnosis not present

## 2020-02-18 DIAGNOSIS — M25871 Other specified joint disorders, right ankle and foot: Secondary | ICD-10-CM | POA: Diagnosis not present

## 2020-02-18 DIAGNOSIS — M79671 Pain in right foot: Secondary | ICD-10-CM | POA: Diagnosis not present

## 2020-02-22 ENCOUNTER — Telehealth (INDEPENDENT_AMBULATORY_CARE_PROVIDER_SITE_OTHER): Payer: Medicare PPO | Admitting: Psychiatry

## 2020-02-22 ENCOUNTER — Other Ambulatory Visit: Payer: Self-pay

## 2020-02-22 ENCOUNTER — Encounter: Payer: Self-pay | Admitting: Psychiatry

## 2020-02-22 DIAGNOSIS — F1421 Cocaine dependence, in remission: Secondary | ICD-10-CM

## 2020-02-22 DIAGNOSIS — F3176 Bipolar disorder, in full remission, most recent episode depressed: Secondary | ICD-10-CM | POA: Diagnosis not present

## 2020-02-22 DIAGNOSIS — F4310 Post-traumatic stress disorder, unspecified: Secondary | ICD-10-CM | POA: Diagnosis not present

## 2020-02-22 DIAGNOSIS — F5105 Insomnia due to other mental disorder: Secondary | ICD-10-CM

## 2020-02-22 DIAGNOSIS — F1021 Alcohol dependence, in remission: Secondary | ICD-10-CM | POA: Diagnosis not present

## 2020-02-22 MED ORDER — AMITRIPTYLINE HCL 100 MG PO TABS: 100.0000 mg | ORAL_TABLET | Freq: Every day | ORAL | 1 refills | Status: DC

## 2020-02-22 MED ORDER — QUETIAPINE FUMARATE 50 MG PO TABS: 50.0000 mg | ORAL_TABLET | Freq: Every day | ORAL | 1 refills | Status: DC | PRN

## 2020-02-22 MED ORDER — BUSPIRONE HCL 30 MG PO TABS: 15.0000 mg | ORAL_TABLET | Freq: Two times a day (BID) | ORAL | 0 refills | Status: DC

## 2020-02-22 MED ORDER — HYDROXYZINE PAMOATE 50 MG PO CAPS: 50.0000 mg | ORAL_CAPSULE | Freq: Every evening | ORAL | 1 refills | Status: DC | PRN

## 2020-02-22 MED ORDER — LAMOTRIGINE 200 MG PO TABS: 200.0000 mg | ORAL_TABLET | Freq: Every day | ORAL | 1 refills | Status: DC

## 2020-02-22 NOTE — Progress Notes (Signed)
Provider Location : [facility] Patient Location : Home  Virtual Visit via Video Note  I connected with Johnny Villa on 02/22/20 at  1:00 PM EDT by a video enabled telemedicine application and verified that I am speaking with the correct person using two identifiers.   I discussed the limitations of evaluation and management by telemedicine and the availability of in person appointments. The patient expressed understanding and agreed to proceed.  I discussed the assessment and treatment plan with the patient. The patient was provided an opportunity to ask questions and all were answered. The patient agreed with the plan and demonstrated an understanding of the instructions.   The patient was advised to call back or seek an in-person evaluation if the symptoms worsen or if the condition fails to improve as anticipated.  [place] MD OP Progress Note  02/22/2020 5:25 PM Johnny Villa  MRN:  [id number]  Chief Complaint:  Chief Complaint    Follow-up     HPI: Johnny Villa is a 44 year old male, currently lives in [place], disabled, has a history of PTSD, bipolar disorder, panic attacks, alcohol use disorder in remission, migraine headaches, cocaine use disorder in remission, chronic back pain was evaluated by telemedicine today.  Patient today reports he is currently doing well with regards to his mood.  He reports he is currently back in [place].  He reports his dad is currently discharged from the hospital.  He reports his dad is making progress.  Patient reports he is compliant on his medications.  He denies any significant mood swings.  He continues to did not have any side effects to medications in general.  He reports sleep is better.  Patient reports he is currently in [place] meetings and continues to have their support.  He denies any other concerns today.  Visit Diagnosis:    ICD-10-CM   1. PTSD (post-traumatic stress disorder)  F43.10 amitriptyline (ELAVIL) 100 MG tablet     QUEtiapine (SEROQUEL) 50 MG tablet  2. Bipolar disorder, in full remission, most recent episode depressed ([name])  F31.76 amitriptyline (ELAVIL) 100 MG tablet    QUEtiapine (SEROQUEL) 50 MG tablet  3. Insomnia due to mental condition  F51.05 amitriptyline (ELAVIL) 100 MG tablet    hydrOXYzine (VISTARIL) 50 MG capsule  4. Alcohol use disorder, moderate, in early remission ([place])  F10.21   5. Cocaine use disorder, moderate, in sustained remission ([name])  F14.21 lamoTRIgine (LAMICTAL) 200 MG tablet    DISCONTINUED: lamoTRIgine (LAMICTAL) 200 MG tablet    Past Psychiatric History: I have reviewed past psychiatric history from my progress note on 05/04/2019.  Past trials of Klonopin, Lexapro, Rexulti, Wellbutrin, Lamictal, lithium, BuSpar, Vraylar  Past Medical History:  Past Medical History:  Diagnosis Date  . Bipolar disorder [facility])    psychiatrist in [place]  . Blood in stool   . Depression   . Drug abuse in remission [facility])    sober for 6 years  . Fatty liver 01/2018  . H/O alcohol abuse   . Headache    migraines  . Hypertension   . Low testosterone in male     Past Surgical History:  Procedure Laterality Date  . APPENDECTOMY     2006  . COLONOSCOPY WITH PROPOFOL N/A 12/30/2017   Procedure: COLONOSCOPY WITH PROPOFOL;  Surgeon: Lin Landsman, MD;  [facility] END[facility];  Service: Gastroenterology;  Laterality: N/A;  . ESOPHAGOGASTRODUODENOSCOPY (EGD) WITH PROPOFOL N/A 12/30/2017   Procedure: ESOPHAGOGASTRODUODENOSCOPY (EGD) WITH PROPOFOL;  Surgeon: Lin Landsman,  MD;  Location: [facility] ENDOSCOPY;  Service: Gastroenterology;  Laterality: N/A;    Family Psychiatric History: I have reviewed family psychiatric history from my progress note on 05/04/2019  Family History:  Family History  Problem Relation Age of Onset  . Depression Mother   . Alcohol abuse Father   . Cancer Father        prostate dx'ed 25  . COPD Father   . Hyperlipidemia Father   . Hypertension Father      Social History: I have reviewed social history from my progress note on 05/04/2019 Social History   Socioeconomic History  . Marital status: Single    Spouse name: Not on file  . Number of children: Not on file  . Years of education: Not on file  . Highest education level: Not on file  Occupational History  . Occupation: disabled  Tobacco Use  . Smoking status: Former Smoker    Packs/day: 0.25    Years: 2.00    Pack years: 0.50    Types: Cigarettes    Quit date: 1999    Years since quitting: 22.4  . Smokeless tobacco: Former Systems developer    Types: Chew  Substance and Sexual Activity  . Alcohol use: Not Currently    Comment: former quit 03/14/2012  . Drug use: Not Currently    Comment: former quit cocaine 03/14/2012   . Sexual activity: Yes  Other Topics Concern  . 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 Difficulty of Paying Living Expenses:   Food Insecurity:   . Worried About Charity fundraiser in the Last Year:   . Arboriculturist in the Last Year:   Transportation Needs:   . Film/video editor (Medical):   Marland Kitchen Lack of Transportation (Non-Medical):   Physical Activity:   . Days of Exercise per Week:   . Minutes of Exercise per Session:   Stress:   . Feeling of Stress :   Social Connections:   . Frequency of Communication with Friends and Family:   . Frequency of Social Gatherings with Friends and Family:   . Attends Religious Services:   . Active Member of Clubs or Organizations:   . Attends Archivist Meetings:   Marland Kitchen Marital Status:     Allergies: No Known Allergies  Metabolic Disorder Labs: Lab Results  Component Value Date   HGBA1C 5.6 09/13/2019   MPG 114.02 09/13/2019   No results found for: PROLACTIN Lab Results  Component Value Date   CHOL 194 08/10/2019   TRIG 60.0 08/10/2019    HDL 56.20 08/10/2019   CHOLHDL 3 08/10/2019   VLDL 12.0 08/10/2019   LDLCALC 126 (H) 08/10/2019   LDLCALC 114 (H) 10/25/2017   Lab Results  Component Value Date   TSH 1.76 08/10/2019   TSH 0.97 06/23/2018    Therapeutic Level Labs: Lab Results  Component Value Date   LITHIUM 0.4 (L) 06/23/2018   LITHIUM 0.8 10/25/2017   No results found for: VALPROATE No components found for:  CBMZ  Current Medications: Current Outpatient Medications  Medication Sig Dispense Refill  . amitriptyline (ELAVIL) 100 MG tablet Take 1 tablet (100 mg total) by mouth at bedtime. 30 tablet 1  . buPROPion (WELLBUTRIN XL)  300 MG 24 hr tablet TAKE ONE TABLET BY MOUTH DAILY 90 tablet 0  . busPIRone (BUSPAR) 30 MG tablet Take 1 tablet (30 mg total) by mouth 2 (two) times daily. 180 tablet 1  . clomiPHENE (CLOMID) 50 MG tablet Take by mouth.    . gabapentin (NEURONTIN) 600 MG tablet Take 1 tablet (600 mg total) by mouth 2 (two) times daily. 60 tablet 2  . hydrOXYzine (VISTARIL) 50 MG capsule Take 1 capsule (50 mg total) by mouth at bedtime as needed. For sleep 90 capsule 1  . lamoTRIgine (LAMICTAL) 200 MG tablet Take 1 tablet (200 mg total) by mouth daily. 90 tablet 1  . ondansetron (ZOFRAN-ODT) 4 MG disintegrating tablet     . QUEtiapine (SEROQUEL) 50 MG tablet Take 1 tablet (50 mg total) by mouth daily as needed. For severe anxiety agitation 30 tablet 1  . SUMAtriptan (IMITREX) 100 MG tablet      No current facility-administered medications for this visit.     Musculoskeletal: Strength & Muscle Tone: UTA Gait & Station: normal Patient leans: N/A  Psychiatric Specialty Exam: Review of Systems  Psychiatric/Behavioral: Negative for agitation, behavioral problems, confusion, decreased concentration, dysphoric mood, hallucinations, self-injury, sleep disturbance and suicidal ideas. The patient is not nervous/anxious and is not hyperactive.   All other systems reviewed and are negative.   There were  no vitals taken for this visit.There is no height or weight on file to calculate BMI.  General Appearance: Casual  Eye Contact:  Fair  Speech:  Clear and Coherent  Volume:  Normal  Mood:  Euthymic  Affect:  Congruent  Thought Process:  Goal Directed and Descriptions of Associations: Intact  Orientation:  Full (Time, Place, and Person)  Thought Content: Logical   Suicidal Thoughts:  No  Homicidal Thoughts:  No  Memory:  Immediate;   Fair Recent;   Fair Remote;   Fair  Judgement:  Fair  Insight:  Fair  Psychomotor Activity:  Normal  Concentration:  Concentration: Fair and Attention Span: Fair  Recall:  [employer] of Knowledge: Fair  Language: Fair  Akathisia:  No  Handed:  Right  AIMS (if indicated): UTA  Assets:  Communication Skills Desire for Improvement Housing Social Support  ADL's:  Intact  Cognition: WNL  Sleep:  Fair   Screenings: AIMS     Admission (Discharged) from 09/09/2019 in [place] Total Score  0    AUDIT     Admission (Discharged) from 09/09/2019 in [place] Admission (Discharged) from 06/01/2019 in [place]  Alcohol Use Disorder Identification Test Final Score (AUDIT)  40  33    GAD-7     Video Visit from 02/02/2020 in [facility]  Total GAD-7 Score  0    PHQ2-9     Video Visit from 02/02/2020 in [facility] Office Visit from 07/09/2019 in [place] from 06/26/2019 in [place] Office Visit from 06/24/2018 in [place] Office Visit from 06/19/2018 in [place]  PHQ-2 Total Score  0  2  0  0  0  PHQ-9 Total Score  --  4  --  --  --       Assessment and Plan: Johnny Villa is a 44 year old male, divorced, lives in [place], has a history of PTSD, panic attacks, bipolar disorder, alcohol and cocaine  use disorder, migraine headache  was evaluated by telemedicine today.  Patient is biologically predisposed given his history of substance abuse problems in the past as well as history of trauma .  Patient with recent psychosocial stressors of his dad's health issues.  Patient however is currently making progress.  Plan as noted below.  Plan PTSD-improving Reduce BuSpar to 15 mg p.o. twice daily for 2 weeks and then reduce to 15 mg p.o. daily for 2 weeks and stop taking it. Elavil 100 mg p.o. nightly Hydroxyzine 50 mg p.o. nightly as needed Seroquel 50 mg p.o. daily as needed for severe racing thoughts/anxiety  Bipolar disorder in remission Lamictal 200 mg p.o. daily Wellbutrin XL 300 mg p.o. daily  Alcohol use disorder in remission Continue AA meetings  Cocaine use disorder in remission Patient continues to remain sober  Follow-up in clinic in 1 month or sooner if needed.  I have spent atleast 20 minutes non face to face with patient today. More than 50 % of the time was spent for preparing to see the patient ( e.g., review of test, records ), ordering medications and test ,psychoeducation and supportive psychotherapy and care coordination,as well as documenting clinical information in electronic health record. This note was generated in part or whole with voice recognition software. Voice recognition is usually quite accurate but there are transcription errors that can and very often do occur. I apologize for any typographical errors that were not detected and corrected.       Ursula Alert, MD 02/22/2020, 5:25 PM

## 2020-02-26 ENCOUNTER — Other Ambulatory Visit (INDEPENDENT_AMBULATORY_CARE_PROVIDER_SITE_OTHER): Payer: Medicare PPO

## 2020-02-26 ENCOUNTER — Other Ambulatory Visit: Payer: Self-pay

## 2020-02-26 DIAGNOSIS — E785 Hyperlipidemia, unspecified: Secondary | ICD-10-CM

## 2020-02-26 DIAGNOSIS — R739 Hyperglycemia, unspecified: Secondary | ICD-10-CM

## 2020-02-26 DIAGNOSIS — T452X4D Poisoning by vitamins, undetermined, subsequent encounter: Secondary | ICD-10-CM

## 2020-02-26 DIAGNOSIS — R635 Abnormal weight gain: Secondary | ICD-10-CM | POA: Diagnosis not present

## 2020-02-26 DIAGNOSIS — F3130 Bipolar disorder, current episode depressed, mild or moderate severity, unspecified: Secondary | ICD-10-CM

## 2020-02-26 DIAGNOSIS — Z13220 Encounter for screening for lipoid disorders: Secondary | ICD-10-CM | POA: Diagnosis not present

## 2020-02-26 DIAGNOSIS — Z1389 Encounter for screening for other disorder: Secondary | ICD-10-CM | POA: Diagnosis not present

## 2020-02-26 DIAGNOSIS — Z0000 Encounter for general adult medical examination without abnormal findings: Secondary | ICD-10-CM | POA: Diagnosis not present

## 2020-02-26 LAB — URINALYSIS, ROUTINE W REFLEX MICROSCOPIC
Bilirubin Urine: NEGATIVE
Hgb urine dipstick: NEGATIVE
Ketones, ur: NEGATIVE
Leukocytes,Ua: NEGATIVE
Nitrite: NEGATIVE
Specific Gravity, Urine: 1.02 (ref 1.000–1.030)
Total Protein, Urine: NEGATIVE
Urine Glucose: NEGATIVE
Urobilinogen, UA: 0.2 (ref 0.0–1.0)
pH: 5.5 (ref 5.0–8.0)

## 2020-02-26 LAB — LIPID PANEL
Cholesterol: 231 mg/dL — ABNORMAL HIGH (ref 0–200)
HDL: 61.3 mg/dL (ref >39.00)
LDL Cholesterol: 158 mg/dL — ABNORMAL HIGH (ref 0–99)
NonHDL: 169.67
Total CHOL/HDL Ratio: 4
Triglycerides: 58 mg/dL (ref 0.0–149.0)
VLDL: 11.6 mg/dL (ref 0.0–40.0)

## 2020-02-26 LAB — COMPREHENSIVE METABOLIC PANEL
ALT: 37 U/L (ref 0–53)
AST: 33 U/L (ref 0–37)
Albumin: 4.6 g/dL (ref 3.5–5.2)
Alkaline Phosphatase: 42 U/L (ref 39–117)
BUN: 21 mg/dL (ref 6–23)
CO2: 28 mEq/L (ref 19–32)
Calcium: 9.7 mg/dL (ref 8.4–10.5)
Chloride: 102 mEq/L (ref 96–112)
Creatinine, Ser: 1.05 mg/dL (ref 0.40–1.50)
GFR: 77.46 mL/min (ref >60.00)
Glucose, Bld: 107 mg/dL — ABNORMAL HIGH (ref 70–99)
Potassium: 4.5 mEq/L (ref 3.5–5.1)
Sodium: 136 mEq/L (ref 135–145)
Total Bilirubin: 1.2 mg/dL (ref 0.2–1.2)
Total Protein: 7 g/dL (ref 6.0–8.3)

## 2020-02-26 LAB — HEMOGLOBIN A1C: Hgb A1c MFr Bld: 5.6 % (ref 4.6–6.5)

## 2020-02-26 LAB — VITAMIN D 25 HYDROXY (VIT D DEFICIENCY, FRACTURES): VITD: 65.97 ng/mL (ref 30.00–100.00)

## 2020-02-27 LAB — CORTISOL-AM, BLOOD: Cortisol - AM: 15.8 ug/dL

## 2020-03-12 ENCOUNTER — Encounter: Payer: Self-pay | Admitting: Internal Medicine

## 2020-03-14 ENCOUNTER — Telehealth: Payer: Self-pay | Admitting: Internal Medicine

## 2020-03-14 NOTE — Telephone Encounter (Signed)
List attached to mychart message and sent to the patient as I am not in office to mail physical copy.

## 2020-03-14 NOTE — Telephone Encounter (Signed)
Johnny Glow McLean-Scocuzza, MD  03/01/2020 7:38 AM EDT     Liver kidneys normal  Cholesterol worse  -mail cholesterol handout  Vitamin D normal  No prediabetes Urine normal  Cortisol normal   Levels were 15.8. Please advise

## 2020-03-14 NOTE — Telephone Encounter (Signed)
Mail cholesterol handout  Cortisol #s normal no need for lowering  TMS

## 2020-03-18 ENCOUNTER — Other Ambulatory Visit: Payer: Medicare PPO

## 2020-03-28 ENCOUNTER — Telehealth: Payer: Self-pay | Admitting: Internal Medicine

## 2020-03-28 NOTE — Telephone Encounter (Signed)
Rejection Reason - Patient Declined" [facility] said about 5 hours ago

## 2020-04-01 ENCOUNTER — Encounter: Payer: Self-pay | Admitting: Psychiatry

## 2020-04-01 ENCOUNTER — Telehealth (INDEPENDENT_AMBULATORY_CARE_PROVIDER_SITE_OTHER): Payer: Medicare PPO | Admitting: Psychiatry

## 2020-04-01 ENCOUNTER — Other Ambulatory Visit: Payer: Self-pay

## 2020-04-01 DIAGNOSIS — F3176 Bipolar disorder, in full remission, most recent episode depressed: Secondary | ICD-10-CM

## 2020-04-01 DIAGNOSIS — F5105 Insomnia due to other mental disorder: Secondary | ICD-10-CM | POA: Diagnosis not present

## 2020-04-01 DIAGNOSIS — F1421 Cocaine dependence, in remission: Secondary | ICD-10-CM

## 2020-04-01 DIAGNOSIS — F1021 Alcohol dependence, in remission: Secondary | ICD-10-CM | POA: Diagnosis not present

## 2020-04-01 DIAGNOSIS — F4310 Post-traumatic stress disorder, unspecified: Secondary | ICD-10-CM | POA: Diagnosis not present

## 2020-04-01 MED ORDER — BUPROPION HCL ER (XL) 300 MG PO TB24: 300.0000 mg | ORAL_TABLET | Freq: Every day | ORAL | 0 refills | Status: DC

## 2020-04-01 MED ORDER — BUSPIRONE HCL 30 MG PO TABS: 30.0000 mg | ORAL_TABLET | Freq: Two times a day (BID) | ORAL | 0 refills | Status: DC

## 2020-04-01 MED ORDER — ZOLPIDEM TARTRATE 10 MG PO TABS: 10.0000 mg | ORAL_TABLET | Freq: Every evening | ORAL | 0 refills | Status: DC | PRN

## 2020-04-01 NOTE — Progress Notes (Signed)
Provider Location : [facility] Patient Location : Home  Virtual Visit via Video Note  I connected with Johnny Villa on 04/01/20 at 11:00 AM EDT by a video enabled telemedicine application and verified that I am speaking with the correct person using two identifiers.   I discussed the limitations of evaluation and management by telemedicine and the availability of in person appointments. The patient expressed understanding and agreed to proceed.   I discussed the assessment and treatment plan with the patient. The patient was provided an opportunity to ask questions and all were answered. The patient agreed with the plan and demonstrated an understanding of the instructions.   The patient was advised to call back or seek an in-person evaluation if the symptoms worsen or if the condition fails to improve as anticipated.    Johnny Villa OP Progress Note  04/01/2020 1:35 PM Johnny Villa  MRN:  [id number]  Chief Complaint:  Chief Complaint    Follow-up     HPI: Johnny Villa is a 44 year old male, currently lives in [place], disabled, has a history of PTSD, bipolar disorder, panic attacks, alcohol use disorder in remission, migraine headaches, cocaine use disorder in remission, chronic back pain was evaluated by telemedicine today.  Patient today reports mood wise he is stable.  He denies any significant depression or anxiety symptoms.  He he reports he did not do well when he tried to taper himself off of the BuSpar and hence went back to taking BuSpar 30 mg twice a day.  Patient however reports he is currently not sleeping.  He reports the Elavil does not help him to sleep more than 4 hours.  It initially helped however does not anymore.  He denies side effects to the medications.  He is compliant on all of his medications.  Patient reports he continues to be in [place] meetings twice a day.  Patient denies any suicidality, homicidality or perceptual disturbances.  Patient reports his pain  is better under control right now.  Patient denies any other concerns today.  Visit Diagnosis:    ICD-10-CM   1. PTSD (post-traumatic stress disorder)  F43.10 busPIRone (BUSPAR) 30 MG tablet   stable  2. Bipolar disorder, in full remission, most recent episode depressed ([place])  F31.76 buPROPion (WELLBUTRIN XL) 300 MG 24 hr tablet  3. Insomnia due to mental condition  F51.05 zolpidem (AMBIEN) 10 MG tablet  4. Alcohol use disorder, moderate, in early remission ([place])  F10.21   5. Cocaine use disorder, moderate, in sustained remission ([place])  F14.21     Past Psychiatric History: I have reviewed past psychiatric history from my progress note on 05/04/2019.  Past trials of Klonopin, Lexapro, Rexulti, Wellbutrin, Lamictal, lithium, BuSpar, Vraylar, Elavil  Past Medical History:  Past Medical History:  Diagnosis Date  . Bipolar disorder [facility])    psychiatrist in [place]   . Blood in stool   . Depression   . Drug abuse in remission [facility])    sober for 6 years  . Fatty liver 01/2018  . H/O alcohol abuse   . Headache    migraines  . Hypertension   . Low testosterone in male     Past Surgical History:  Procedure Laterality Date  . APPENDECTOMY     2006  . COLONOSCOPY WITH PROPOFOL N/A 12/30/2017   Procedure: COLONOSCOPY WITH PROPOFOL;  Surgeon: Lin Landsman, Villa;  Location: [facility] ENDOSCOPY;  Service: Gastroenterology;  Laterality: N/A;  . ESOPHAGOGASTRODUODENOSCOPY (EGD) WITH PROPOFOL N/A 12/30/2017   Procedure:  ESOPHAGOGASTRODUODENOSCOPY (EGD) WITH PROPOFOL;  Surgeon: Lin Landsman, Villa;  Location: [facility] ENDOSCOPY;  Service: Gastroenterology;  Laterality: N/A;    Family Psychiatric History: I have reviewed family psychiatric history from my progress note on 05/04/2019  Family History:  Family History  Problem Relation Age of Onset  . Depression Mother   . Alcohol abuse Father   . Cancer Father        prostate dx'ed 70  . COPD Father   . Hyperlipidemia Father   . Hypertension  Father     Social History: I have reviewed social history from my progress note on 05/04/2019 Social History   Socioeconomic History  . Marital status: Single    Spouse name: Not on file  . Number of children: Not on file  . Years of education: Not on file  . Highest education level: Not on file  Occupational History  . Occupation: disabled  Tobacco Use  . Smoking status: Former Smoker    Packs/day: 0.25    Years: 2.00    Pack years: 0.50    Types: Cigarettes    Quit date: 1999    Years since quitting: 22.5  . Smokeless tobacco: Former Systems developer    Types: Secondary school teacher  . Vaping Use: Former  Substance and Sexual Activity  . Alcohol use: Not Currently    Comment: former quit 03/14/2012  . Drug use: Not Currently    Comment: former quit cocaine 03/14/2012   . Sexual activity: Yes  Other Topics Concern  . 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 Difficulty of Paying Living Expenses:   Food Insecurity:   . Worried About Charity fundraiser in the Last Year:   . Arboriculturist in the Last Year:   Transportation Needs:   . Film/video editor (Medical):   Marland Kitchen Lack of Transportation (Non-Medical):   Physical Activity:   . Days of Exercise per Week:   . Minutes of Exercise per Session:   Stress:   . Feeling of Stress :   Social Connections:   . Frequency of Communication with Friends and Family:   . Frequency of Social Gatherings with Friends and Family:   . Attends Religious Services:   . Active Member of Clubs or Organizations:   . Attends Archivist Meetings:   Marland Kitchen Marital Status:     Allergies: No Known Allergies  Metabolic Disorder Labs: Lab Results  Component Value Date   HGBA1C 5.6 02/26/2020   MPG 114.02 09/13/2019   No results found for: PROLACTIN Lab Results  Component Value Date    CHOL 231 (H) 02/26/2020   TRIG 58.0 02/26/2020   HDL 61.30 02/26/2020   CHOLHDL 4 02/26/2020   VLDL 11.6 02/26/2020   LDLCALC 158 (H) 02/26/2020   LDLCALC 126 (H) 08/10/2019   Lab Results  Component Value Date   TSH 1.76 08/10/2019   TSH 0.97 06/23/2018    Therapeutic Level Labs: Lab Results  Component Value Date   LITHIUM 0.4 (L) 06/23/2018   LITHIUM 0.8 10/25/2017   No results found for: VALPROATE No components found for:  CBMZ  Current Medications: Current Outpatient Medications  Medication Sig Dispense Refill  . buPROPion (WELLBUTRIN XL) 300 MG  24 hr tablet Take 1 tablet (300 mg total) by mouth daily. 90 tablet 0  . busPIRone (BUSPAR) 30 MG tablet Take 1 tablet (30 mg total) by mouth 2 (two) times daily. Pt has supplies 180 tablet 0  . clomiPHENE (CLOMID) 50 MG tablet Take by mouth.    . gabapentin (NEURONTIN) 600 MG tablet Take 1 tablet (600 mg total) by mouth 2 (two) times daily. 60 tablet 2  . hydrOXYzine (VISTARIL) 50 MG capsule Take 1 capsule (50 mg total) by mouth at bedtime as needed. For sleep 90 capsule 1  . lamoTRIgine (LAMICTAL) 200 MG tablet Take 1 tablet (200 mg total) by mouth daily. 90 tablet 1  . ondansetron (ZOFRAN-ODT) 4 MG disintegrating tablet     . QUEtiapine (SEROQUEL) 50 MG tablet Take 1 tablet (50 mg total) by mouth daily as needed. For severe anxiety agitation 30 tablet 1  . SUMAtriptan (IMITREX) 100 MG tablet     . zolpidem (AMBIEN) 10 MG tablet Take 1 tablet (10 mg total) by mouth at bedtime as needed for sleep. 90 tablet 0   No current facility-administered medications for this visit.     Musculoskeletal: Strength & Muscle Tone: UTA Gait & Station: normal Patient leans: N/A  Psychiatric Specialty Exam: Review of Systems  Psychiatric/Behavioral: Positive for sleep disturbance.  All other systems reviewed and are negative.   There were no vitals taken for this visit.There is no height or weight on file to calculate BMI.  General  Appearance: Casual  Eye Contact:  Fair  Speech:  Clear and Coherent  Volume:  Normal  Mood:  Euthymic  Affect:  Appropriate  Thought Process:  Goal Directed and Descriptions of Associations: Intact  Orientation:  Full (Time, Place, and Person)  Thought Content: Logical   Suicidal Thoughts:  No  Homicidal Thoughts:  No  Memory:  Immediate;   Fair Recent;   Fair Remote;   Fair  Judgement:  Fair  Insight:  Fair  Psychomotor Activity:  Normal  Concentration:  Concentration: Fair and Attention Span: Fair  Recall:  [employer] of Knowledge: Fair  Language: Fair  Akathisia:  No  Handed:  Right  AIMS (if indicated): UTA  Assets:  Communication Skills Desire for Improvement Housing Social Support  ADL's:  Intact  Cognition: WNL  Sleep:  Poor   Screenings: AIMS     Admission (Discharged) from 09/09/2019 in [place] Total Score 0    AUDIT     Admission (Discharged) from 09/09/2019 in [place] Admission (Discharged) from 06/01/2019 in [place]  Alcohol Use Disorder Identification Test Final Score (AUDIT) 40 33    GAD-7     Video Visit [facility] 02/02/2020 in [facility]  Total GAD-7 Score 0    PHQ2-9     Video Visit from 02/02/2020 in [facility] Office Visit from 07/09/2019 in [place] from 06/26/2019 in [place] Office Visit from 06/24/2018 in [place] Office Visit from 06/19/2018 in [place]  PHQ-2 Total Score 0 2 0 0 0  PHQ-9 Total Score -- 4 -- -- --       Assessment and Plan: Johnny Villa is a 44 year old male, divorced, lives in [place], has a history of PTSD, bipolar disorder, alcohol and cocaine use disorder, migraine headaches, was evaluated by telemedicine today.  Patient is biologically predisposed given his  history of substance abuse problems in the past as well as history of trauma.  Patient with psychosocial stressors of his dad's health issues.  Patient however is currently coping and is involved in [employer].  He continues to struggle with sleep although his mood symptoms are stable and will continue to benefit from medication readjustment.  Plan as noted below.  Plan PTSD-improving BuSpar 30 mg p.o. twice daily Discontinue Elavil for lack of benefit Start Ambien 10 mg p.o. nightly as needed Hydroxyzine 50 mg p.o. nightly as needed He does have Seroquel 50 mg p.o. daily as needed for severe racing thoughts/anxiety-however he reports he does not use it anymore.  Bipolar disorder in remission Lamictal 200 mg p.o. daily Wellbutrin XL 300 mg p.o. daily   Alcohol use disorder in remission Continue AA meetings  Cocaine use disorder in remission Patient continues to remain sober  Follow-up in clinic in 6 to 8 weeks or sooner if needed.  I have spent atleast 20 minutes non face to face with patient today. More than 50 % of the time was spent for preparing to see the patient ( e.g., review of test, records ),  ordering medications and test ,psychoeducation and supportive psychotherapy and care coordination,as well as documenting clinical information in electronic health record. This note was generated in part or whole with voice recognition software. Voice recognition is usually quite accurate but there are transcription errors that can and very often do occur. I apologize for any typographical errors that were not detected and corrected.       Ursula Alert, Villa 04/01/2020, 1:34 PM

## 2020-04-19 ENCOUNTER — Telehealth: Payer: Self-pay

## 2020-04-19 NOTE — Telephone Encounter (Signed)
pt called left a message that he is having alot of anxiety and wanted to know if he could increase his seroquel from 50mg  to 100mg 

## 2020-04-19 NOTE — Telephone Encounter (Signed)
Yes he can take Seroquel 50 mg 1.5-2 tablets(75-100 mg in total) for his anxiety. Please call him and let him know. Thanks

## 2020-04-21 NOTE — Telephone Encounter (Signed)
left two message with instrucation and if any question to call the office back.

## 2020-04-22 ENCOUNTER — Telehealth: Payer: Self-pay

## 2020-04-22 DIAGNOSIS — F4310 Post-traumatic stress disorder, unspecified: Secondary | ICD-10-CM

## 2020-04-22 DIAGNOSIS — F3176 Bipolar disorder, in full remission, most recent episode depressed: Secondary | ICD-10-CM

## 2020-04-22 MED ORDER — QUETIAPINE FUMARATE 100 MG PO TABS: 100.0000 mg | ORAL_TABLET | Freq: Every day | ORAL | 0 refills | Status: DC | PRN

## 2020-04-22 NOTE — Telephone Encounter (Signed)
pt called states that the 100mg  of seroquel works better than the 50mg  . pt called to see if a new rx for the 100mg  can be sent the the [facility]

## 2020-04-22 NOTE — Telephone Encounter (Signed)
I sent rx of Seroquel 100 mg to pt's pharmacy.

## 2020-05-12 DIAGNOSIS — F1010 Alcohol abuse, uncomplicated: Secondary | ICD-10-CM | POA: Diagnosis not present

## 2020-05-12 DIAGNOSIS — F10129 Alcohol abuse with intoxication, unspecified: Secondary | ICD-10-CM | POA: Diagnosis not present

## 2020-05-12 DIAGNOSIS — Z20822 Contact with and (suspected) exposure to covid-19: Secondary | ICD-10-CM | POA: Diagnosis not present

## 2020-05-12 DIAGNOSIS — T43592A Poisoning by other antipsychotics and neuroleptics, intentional self-harm, initial encounter: Secondary | ICD-10-CM | POA: Diagnosis not present

## 2020-05-12 DIAGNOSIS — F3170 Bipolar disorder, currently in remission, most recent episode unspecified: Secondary | ICD-10-CM | POA: Diagnosis not present

## 2020-05-12 DIAGNOSIS — F99 Mental disorder, not otherwise specified: Secondary | ICD-10-CM | POA: Diagnosis not present

## 2020-05-12 DIAGNOSIS — T1491XA Suicide attempt, initial encounter: Secondary | ICD-10-CM | POA: Diagnosis not present

## 2020-05-12 DIAGNOSIS — Z79899 Other long term (current) drug therapy: Secondary | ICD-10-CM | POA: Diagnosis not present

## 2020-05-19 ENCOUNTER — Encounter: Payer: Medicare PPO | Admitting: Student in an Organized Health Care Education/Training Program

## 2020-05-30 ENCOUNTER — Telehealth: Payer: Self-pay

## 2020-05-30 DIAGNOSIS — F3176 Bipolar disorder, in full remission, most recent episode depressed: Secondary | ICD-10-CM

## 2020-05-30 MED ORDER — BUPROPION HCL ER (XL) 300 MG PO TB24: 300.0000 mg | ORAL_TABLET | Freq: Every day | ORAL | 0 refills | Status: DC

## 2020-05-30 NOTE — Telephone Encounter (Signed)
Sent wellbutrin to pharmacy.

## 2020-05-30 NOTE — Telephone Encounter (Signed)
pt called states he needs refills on his wellbutrin

## 2020-06-01 ENCOUNTER — Encounter: Payer: Self-pay | Admitting: Student in an Organized Health Care Education/Training Program

## 2020-06-01 ENCOUNTER — Ambulatory Visit
Payer: Medicare PPO | Attending: Student in an Organized Health Care Education/Training Program | Admitting: Student in an Organized Health Care Education/Training Program

## 2020-06-01 DIAGNOSIS — G894 Chronic pain syndrome: Secondary | ICD-10-CM | POA: Diagnosis not present

## 2020-06-01 DIAGNOSIS — M5416 Radiculopathy, lumbar region: Secondary | ICD-10-CM | POA: Diagnosis not present

## 2020-06-01 MED ORDER — GABAPENTIN 600 MG PO TABS: 600.0000 mg | ORAL_TABLET | Freq: Three times a day (TID) | ORAL | 5 refills | Status: DC

## 2020-06-01 NOTE — Progress Notes (Signed)
Patient: Johnny Villa  Service Category: E/M  Provider: Gillis Santa, MD  DOB: 08-May-1976  DOS: 06/01/2020  Location: Office  MRN: [id number]  Setting: Ambulatory outpatient  Referring Provider: McLean-Scocuzza, Olivia Mackie *  Type: Established Patient  Specialty: Interventional Pain Management  PCP: McLean-Scocuzza, Nino Glow, MD  Location: Home  Delivery: TeleHealth     Virtual Encounter - Pain Management PROVIDER NOTE: Information contained herein reflects review and annotations entered in association with encounter. Interpretation of such information and data should be left to medically-trained personnel. Information provided to patient can be located elsewhere in the medical record under "Patient Instructions". Document created using STT-dictation technology, any transcriptional errors that may result from process are unintentional.    Contact & Pharmacy Preferred: [phone] Home: [phone] (home) Mobile: [phone] (mobile) E-mail: [email or web address] [place], [address] [place] [address] Phone: [phone] Fax: [place], [address] [address] Phone: [phone] Fax: [phone]   Pre-screening  Johnny Villa offered "in-person" vs "virtual" encounter. He indicated preferring virtual for this encounter.   Reason COVID-19*  Social distancing based on CDC and AMA recommendations.   I contacted Johnny Villa on 06/01/2020 via telephone.      I clearly identified myself as Gillis Santa, MD. I verified that I was speaking with the correct person using two identifiers (Name: Johnny Villa, and date of birth: 08-May-1976).  Consent I sought verbal advanced consent from Johnny Villa for virtual visit interactions. I informed Johnny Villa of possible security and privacy concerns, risks, and limitations associated  with providing "not-in-person" medical evaluation and management services. I also informed Johnny Villa of the availability of "in-person" appointments. Finally, I informed him that there would be a charge for the virtual visit and that he could be  personally, fully or partially, financially responsible for it. Johnny Villa expressed understanding and agreed to proceed.   Historic Elements   Johnny Villa is a 44 y.o. year old, male patient evaluated today after his last contact with our practice on 05/19/2020. Johnny Villa  has a past medical history of Bipolar disorder ([place]), Blood in stool, Depression, Drug abuse in remission [facility]), Fatty liver (01/2018), H/O alcohol abuse, Headache, Hypertension, and Low testosterone in male. He also  has a past surgical history that includes Appendectomy; Colonoscopy with propofol (N/A, 12/30/2017); and Esophagogastroduodenoscopy (egd) with propofol (N/A, 12/30/2017). Johnny Villa has a current medication list which includes the following prescription(s): bupropion, buspirone, clomiphene, hydroxyzine, lamotrigine, ondansetron, sumatriptan, zolpidem, gabapentin, and quetiapine. He  reports that he quit smoking about 22 years ago. His smoking use included cigarettes. He has a 0.50 pack-year smoking history. He has quit using smokeless tobacco.  His smokeless tobacco use included chew. He reports previous alcohol use. He reports previous drug use. Johnny Villa has No Known Allergies.   HPI  Today, he is being contacted for medication management.   Refill gabapentin, current dose 600 mg twice daily.  No side effects of sedation, lower extremity swelling, nausea.  Discussed increasing to 600 mg 3 times daily.  Risk and benefits reviewed and patient would like to proceed.  Refill as below.   Laboratory Chemistry Profile   Renal Lab Results  Component Value Date   BUN 21 02/26/2020   CREATININE 1.05 02/26/2020   BCR 14 03/18/2019   GFR 77.46 02/26/2020   GFRAA >60  09/13/2019  GFRNONAA >60 09/13/2019     Hepatic Lab Results  Component Value Date   AST 33 02/26/2020   ALT 37 02/26/2020   ALBUMIN 4.6 02/26/2020   ALKPHOS 42 02/26/2020     Electrolytes Lab Results  Component Value Date   NA 136 02/26/2020   K 4.5 02/26/2020   CL 102 02/26/2020   CALCIUM 9.7 02/26/2020   MG 2.3 09/13/2019   PHOS 3.4 09/13/2019     Bone Lab Results  Component Value Date   VD25OH 65.97 02/26/2020   TESTOFREE 114.2 08/10/2019   TESTOSTERONE 595 08/10/2019     Inflammation (CRP: Acute Phase) (ESR: Chronic Phase) Lab Results  Component Value Date   CRP 1.0 10/25/2017   ESRSEDRATE 5 10/25/2017       Note: Above Lab results reviewed.   Assessment  Diagnoses of Lumbar radiculopathy, Lumbar radicular pain, and Chronic pain syndrome were pertinent to this visit.  Plan of Care   Johnny Villa has a current medication list which includes the following long-term medication(s): bupropion, lamotrigine, zolpidem, gabapentin, and quetiapine.  Pharmacotherapy (Medications Ordered): Meds ordered this encounter  Medications  . gabapentin (NEURONTIN) 600 MG tablet    Sig: Take 1 tablet (600 mg total) by mouth 3 (three) times daily.    Dispense:  90 tablet    Refill:  5   Follow-up plan:   Return if symptoms worsen or fail to improve.     Status post right C4, C5, C6, C7 RFA on 05/19/2018 for cervical spondylosis.  Status post right L4-L5 ESI #1 on 01/11/2020, repeat as needed     Recent Visits No visits were found meeting these conditions. Showing recent visits within past 90 days and meeting all other requirements Today's Visits Date Type Provider Dept  06/01/20 Telemedicine Gillis Santa, MD [facility]  Showing today's visits and meeting all other requirements Future Appointments No visits were found meeting these conditions. Showing future appointments within next 90 days and meeting all other requirements  I discussed the  assessment and treatment plan with the patient. The patient was provided an opportunity to ask questions and all were answered. The patient agreed with the plan and demonstrated an understanding of the instructions.  Patient advised to call back or seek an in-person evaluation if the symptoms or condition worsens.  Duration of encounter: 48mnutes.  Note by: BGillis Santa MD Date: 06/01/2020; Time: 3:59 PM

## 2020-06-10 ENCOUNTER — Other Ambulatory Visit: Payer: Self-pay

## 2020-06-10 ENCOUNTER — Telehealth (INDEPENDENT_AMBULATORY_CARE_PROVIDER_SITE_OTHER): Payer: Medicare PPO | Admitting: Psychiatry

## 2020-06-10 DIAGNOSIS — F5105 Insomnia due to other mental disorder: Secondary | ICD-10-CM

## 2020-06-10 DIAGNOSIS — F3176 Bipolar disorder, in full remission, most recent episode depressed: Secondary | ICD-10-CM

## 2020-06-10 DIAGNOSIS — F1021 Alcohol dependence, in remission: Secondary | ICD-10-CM

## 2020-06-10 DIAGNOSIS — F4310 Post-traumatic stress disorder, unspecified: Secondary | ICD-10-CM

## 2020-06-10 DIAGNOSIS — F1421 Cocaine dependence, in remission: Secondary | ICD-10-CM

## 2020-06-10 NOTE — Progress Notes (Signed)
No response to call or text or video invite  

## 2020-06-28 ENCOUNTER — Ambulatory Visit: Payer: Medicare PPO

## 2020-07-22 ENCOUNTER — Encounter: Payer: Self-pay | Admitting: Student in an Organized Health Care Education/Training Program

## 2020-07-25 MED ORDER — TIZANIDINE HCL 4 MG PO TABS: 4.0000 mg | ORAL_TABLET | Freq: Two times a day (BID) | ORAL | 0 refills | Status: AC | PRN

## 2020-07-25 NOTE — Telephone Encounter (Signed)
Rx for Tizanidine sent

## 2020-08-01 ENCOUNTER — Encounter: Payer: Self-pay | Admitting: Student in an Organized Health Care Education/Training Program

## 2020-08-01 ENCOUNTER — Encounter: Payer: Self-pay | Admitting: *Deleted

## 2020-08-01 DIAGNOSIS — M5416 Radiculopathy, lumbar region: Secondary | ICD-10-CM

## 2020-08-01 NOTE — Telephone Encounter (Signed)
Referral to [facility] placed, please print and fax for patient  Orders Placed This Encounter  Procedures  . Ambulatory referral to Physical Therapy    Referral Priority:   Routine    Referral Type:   Physical Medicine    Referral Reason:   Specialty Services Required    Requested Specialty:   Physical Therapy    Number of Visits Requested:   1

## 2020-08-02 ENCOUNTER — Telehealth: Payer: Self-pay | Admitting: Student in an Organized Health Care Education/Training Program

## 2020-08-02 NOTE — Telephone Encounter (Signed)
[facility] called stating they need actual order to set patient up for PT. Please enter order and send to [facility]

## 2020-08-02 NOTE — Telephone Encounter (Signed)
Order sent.

## 2020-08-03 NOTE — Telephone Encounter (Signed)
Dena can you fill out one of the orders I laid on you desk for [facility], they cannot read the one that was faxed.

## 2020-08-03 NOTE — Telephone Encounter (Signed)
done

## 2020-08-05 ENCOUNTER — Encounter: Payer: Self-pay | Admitting: Internal Medicine

## 2020-08-05 ENCOUNTER — Ambulatory Visit (INDEPENDENT_AMBULATORY_CARE_PROVIDER_SITE_OTHER): Payer: Medicare PPO | Admitting: Internal Medicine

## 2020-08-05 ENCOUNTER — Other Ambulatory Visit: Payer: Self-pay

## 2020-08-05 VITALS — BP 106/68 | HR 75 | Temp 98.3°F | Ht 71.0 in | Wt 224.0 lb

## 2020-08-05 DIAGNOSIS — M25512 Pain in left shoulder: Secondary | ICD-10-CM

## 2020-08-05 DIAGNOSIS — R7989 Other specified abnormal findings of blood chemistry: Secondary | ICD-10-CM

## 2020-08-05 DIAGNOSIS — E559 Vitamin D deficiency, unspecified: Secondary | ICD-10-CM

## 2020-08-05 DIAGNOSIS — M25561 Pain in right knee: Secondary | ICD-10-CM | POA: Diagnosis not present

## 2020-08-05 DIAGNOSIS — E785 Hyperlipidemia, unspecified: Secondary | ICD-10-CM | POA: Diagnosis not present

## 2020-08-05 DIAGNOSIS — E291 Testicular hypofunction: Secondary | ICD-10-CM

## 2020-08-05 DIAGNOSIS — Z0000 Encounter for general adult medical examination without abnormal findings: Secondary | ICD-10-CM

## 2020-08-05 DIAGNOSIS — N529 Male erectile dysfunction, unspecified: Secondary | ICD-10-CM | POA: Diagnosis not present

## 2020-08-05 DIAGNOSIS — H9313 Tinnitus, bilateral: Secondary | ICD-10-CM | POA: Diagnosis not present

## 2020-08-05 DIAGNOSIS — G43909 Migraine, unspecified, not intractable, without status migrainosus: Secondary | ICD-10-CM | POA: Diagnosis not present

## 2020-08-05 DIAGNOSIS — M542 Cervicalgia: Secondary | ICD-10-CM | POA: Diagnosis not present

## 2020-08-05 DIAGNOSIS — Z13220 Encounter for screening for lipoid disorders: Secondary | ICD-10-CM

## 2020-08-05 DIAGNOSIS — M25511 Pain in right shoulder: Secondary | ICD-10-CM | POA: Diagnosis not present

## 2020-08-05 DIAGNOSIS — Z23 Encounter for immunization: Secondary | ICD-10-CM

## 2020-08-05 DIAGNOSIS — E538 Deficiency of other specified B group vitamins: Secondary | ICD-10-CM

## 2020-08-05 DIAGNOSIS — G8929 Other chronic pain: Secondary | ICD-10-CM

## 2020-08-05 DIAGNOSIS — Z13818 Encounter for screening for other digestive system disorders: Secondary | ICD-10-CM

## 2020-08-05 DIAGNOSIS — Z125 Encounter for screening for malignant neoplasm of prostate: Secondary | ICD-10-CM

## 2020-08-05 MED ORDER — ONDANSETRON 4 MG PO TBDP: 4.0000 mg | ORAL_TABLET | Freq: Three times a day (TID) | ORAL | 11 refills | Status: DC | PRN

## 2020-08-05 MED ORDER — SUMATRIPTAN SUCCINATE 100 MG PO TABS: 100.0000 mg | ORAL_TABLET | ORAL | 11 refills | Status: DC | PRN

## 2020-08-05 MED ORDER — SILDENAFIL CITRATE 20 MG PO TABS: 40.0000 mg | ORAL_TABLET | Freq: Every day | ORAL | 11 refills | Status: DC

## 2020-08-05 NOTE — Patient Instructions (Addendum)
CRP 0.0 - 4.9 mg/L 1.0    10/25/17  Pfizer vaccine [facility]  Dr. Kathyrn Sheriff for ringing in the ears   Tinnitus Tinnitus refers to hearing a sound when there is no actual source for that sound. This is often described as ringing in the ears. However, people with this condition may hear a variety of noises, in one ear or in both ears. The sounds of tinnitus can be soft, loud, or somewhere in between. Tinnitus can last for a few seconds or can be constant for days. It may go away without treatment and come back at various times. When tinnitus is constant or happens often, it can lead to other problems, such as trouble sleeping and trouble concentrating. Almost everyone experiences tinnitus at some point. Tinnitus that is long-lasting (chronic) or comes back often (recurs) may require medical attention. What are the causes? The cause of tinnitus is often not known. In some cases, it can result from:  Exposure to loud noises from machinery, music, or other sources.  An object (foreign body) stuck in the ear.  Earwax buildup.  Drinking alcohol or caffeine.  Taking certain medicines.  Age-related hearing loss. It may also be caused by medical conditions such as:  Ear or sinus infections.  High blood pressure.  Heart diseases.  Anemia.  Allergies.  Meniere's disease.  Thyroid problems.  Tumors.  A weak, bulging blood vessel (aneurysm) near the ear. What are the signs or symptoms? The main symptom of tinnitus is hearing a sound when there is no source for that sound. It may sound like:  Buzzing.  Roaring.  Ringing.  Blowing air.  Hissing.  Whistling.  Sizzling.  Humming.  Running water.  A musical note.  Tapping. Symptoms may affect only one ear (unilateral) or both ears (bilateral). How is this diagnosed? Tinnitus is diagnosed based on your symptoms, your medical history, and a physical exam. Your health care provider may do a  thorough hearing test (audiologic exam) if your tinnitus:  Is unilateral.  Causes hearing difficulties.  Lasts 6 months or longer. You may work with a health care provider who specializes in hearing disorders (audiologist). You may be asked questions about your symptoms and how they affect your daily life. You may have other tests done, such as:  CT scan.  MRI.  An imaging test of how blood flows through your blood vessels (angiogram). How is this treated? Treating an underlying medical condition can sometimes make tinnitus go away. If your tinnitus continues, other treatments may include:  Medicines.  Therapy and counseling to help you manage the stress of living with tinnitus.  Sound generators to mask the tinnitus. These include: ? Tabletop sound machines that play relaxing sounds to help you fall asleep. ? Wearable devices that fit in your ear and play sounds or music. ? Acoustic neural stimulation. This involves using headphones to listen to music that contains an auditory signal. Over time, listening to this signal may change some pathways in your brain and make you less sensitive to tinnitus. This treatment is used for very severe cases when no other treatment is working.  Using hearing aids or cochlear implants if your tinnitus is related to hearing loss. Hearing aids are worn in the outer ear. Cochlear implants are surgically placed in the inner ear. Follow these instructions at home: Managing symptoms      When possible, avoid being in loud places and being exposed to loud sounds.  Wear hearing protection, such as  earplugs, when you are exposed to loud noises.  Use a white noise machine, a humidifier, or other devices to mask the sound of tinnitus.  Practice techniques for reducing stress, such as meditation, yoga, or deep breathing. Work with your health care provider if you need help with managing stress.  Sleep with your head slightly raised. This may reduce the  impact of tinnitus. General instructions  Do not use stimulants, such as nicotine, alcohol, or caffeine. Talk with your health care provider about other stimulants to avoid. Stimulants are substances that can make you feel alert and attentive by increasing certain activities in the body (such as heart rate and blood pressure). These substances may make tinnitus worse.  Take over-the-counter and prescription medicines only as told by your health care provider.  Try to get plenty of sleep each night.  Keep all follow-up visits as told by your health care provider. This is important. Contact a health care provider if:  Your tinnitus continues for 3 weeks or longer without stopping.  You develop sudden hearing loss.  Your symptoms get worse or do not get better with home care.  You feel you are not able to manage the stress of living with tinnitus. Get help right away if:  You develop tinnitus after a head injury.  You have tinnitus along with any of the following: ? Dizziness. ? Loss of balance. ? Nausea and vomiting. ? Sudden, severe headache. These symptoms may represent a serious problem that is an emergency. Do not wait to see if the symptoms will go away. Get medical help right away. Call your local emergency services (911 in the [place] Do not drive yourself to the hospital. Summary  Tinnitus refers to hearing a sound when there is no actual source for that sound. This is often described as ringing in the ears.  Symptoms may affect only one ear (unilateral) or both ears (bilateral).  Use a white noise machine, a humidifier, or other devices to mask the sound of tinnitus.  Do not use stimulants, such as nicotine, alcohol, or caffeine. Talk with your health care provider about other stimulants to avoid. These substances may make tinnitus worse. This information is not intended to replace advice given to you by your health care provider. Make sure you discuss any questions you  have with your health care provider. Document Revised: 04/01/2019 Document Reviewed: 06/27/2017 Elsevier Patient Education  2020 [employer].

## 2020-08-05 NOTE — Progress Notes (Signed)
Chief Complaint  Patient presents with  . Follow-up  . Immunizations  . Medication Refill   Annual  1. Doing well overall 2. Migraines chronic wants refill imitrex tablets 100 mg and wants Rx nasal 20 mg advised not ot use both at the same time also wants refill of zofran  3. ED, Low T wants labs and refills of revatio 4. Mild to moderate intermittent Right knee pain, neck and shoulder pain chronic and intermittent wants referral back to [facility] ortho established since 2019 wants to reestablish for aches and pains 5. tinnitis he does listen to music in his ears, denies anxiety and anxiety controlled f/u with psych   Review of Systems  Constitutional: Negative for weight loss.  HENT: Negative for hearing loss.   Eyes: Negative for blurred vision.  Respiratory: Negative for shortness of breath.   Cardiovascular: Negative for chest pain.  Gastrointestinal: Negative for abdominal pain.  Musculoskeletal: Negative for falls.  Skin: Negative for rash.  Neurological: Negative for headaches.  Psychiatric/Behavioral: Negative for depression.   Past Medical History:  Diagnosis Date  . Bipolar disorder [facility])    psychiatrist in [place]  . Blood in stool   . Depression   . Drug abuse in remission [facility])    sober for 6 years  . Fatty liver 01/2018  . H/O alcohol abuse   . Headache    migraines  . Hypertension   . Low testosterone in male    Past Surgical History:  Procedure Laterality Date  . APPENDECTOMY     2006  . COLONOSCOPY WITH PROPOFOL N/A 12/30/2017   Procedure: COLONOSCOPY WITH PROPOFOL;  Surgeon: Lin Landsman, MD;  Location: [facility] ENDOSCOPY;  Service: Gastroenterology;  Laterality: N/A;  . ESOPHAGOGASTRODUODENOSCOPY (EGD) WITH PROPOFOL N/A 12/30/2017   Procedure: ESOPHAGOGASTRODUODENOSCOPY (EGD) WITH PROPOFOL;  Surgeon: Lin Landsman, MD;  Location: [facility] ENDOSCOPY;  Service: Gastroenterology;  Laterality: N/A;   Family History  Problem Relation Age of Onset  .  Depression Mother   . Alcohol abuse Father   . Cancer Father        prostate dx'ed 23  . COPD Father   . Hyperlipidemia Father   . Hypertension Father    Social History   Socioeconomic History  . Marital status: Single    Spouse name: Not on file  . Number of children: Not on file  . Years of education: Not on file  . Highest education level: Not on file  Occupational History  . Occupation: disabled  Tobacco Use  . Smoking status: Former Smoker    Packs/day: 0.25    Years: 2.00    Pack years: 0.50    Types: Cigarettes    Quit date: 1999    Years since quitting: 22.8  . Smokeless tobacco: Former Systems developer    Types: Secondary school teacher  . Vaping Use: Former  Substance and Sexual Activity  . Alcohol use: Not Currently    Comment: former quit 03/14/2012  . Drug use: Not Currently    Comment: former quit cocaine 03/14/2012   . Sexual activity: Yes  Other Topics Concern  . 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 Difficulty of Paying Living Expenses: Not on  file  Food Insecurity:   . Worried About Charity fundraiser in the Last Year: Not on file  . Ran Out of Food in the Last Year: Not on file  Transportation Needs:   . Lack of Transportation (Medical): Not on file  . Lack of Transportation (Non-Medical): Not on file  Physical Activity:   . Days of Exercise per Week: Not on file  . Minutes of Exercise per Session: Not on file  Stress:   . Feeling of Stress : Not on file  Social Connections:   . Frequency of Communication with Friends and Family: Not on file  . Frequency of Social Gatherings with Friends and Family: Not on file  . Attends Religious Services: Not on file  . Active Member of Clubs or Organizations: Not on file  . Attends Archivist Meetings: Not on file  .  Marital Status: Not on file  Intimate Partner Violence:   . Fear of Current or Ex-Partner: Not on file  . Emotionally Abused: Not on file  . Physically Abused: Not on file  . Sexually Abused: Not on file   Current Meds  Medication Sig  . buPROPion (WELLBUTRIN XL) 300 MG 24 hr tablet Take 1 tablet (300 mg total) by mouth daily.  . busPIRone (BUSPAR) 30 MG tablet Take 1 tablet (30 mg total) by mouth 2 (two) times daily. Pt has supplies  . clomiPHENE (CLOMID) 50 MG tablet Take by mouth.  . gabapentin (NEURONTIN) 600 MG tablet Take 1 tablet (600 mg total) by mouth 3 (three) times daily.  . hydrOXYzine (VISTARIL) 50 MG capsule Take 1 capsule (50 mg total) by mouth at bedtime as needed. For sleep  . lamoTRIgine (LAMICTAL) 200 MG tablet Take 1 tablet (200 mg total) by mouth daily.  . ondansetron (ZOFRAN-ODT) 4 MG disintegrating tablet Take 1 tablet (4 mg total) by mouth every 8 (eight) hours as needed.  Marland Kitchen QUEtiapine (SEROQUEL) 100 MG tablet Take 1 tablet (100 mg total) by mouth daily as needed. For severe anxiety agitation  . sildenafil (REVATIO) 20 MG tablet Take 2 tablets (40 mg total) by mouth daily in the afternoon.  . SUMAtriptan (IMITREX) 100 MG tablet Take 1 tablet (100 mg total) by mouth every 2 (two) hours as needed. Max dose 200 mg/24 hours. Limit to 2x per week  . tiZANidine (ZANAFLEX) 4 MG tablet Take 1 tablet (4 mg total) by mouth 2 (two) times daily as needed for up to 15 days for muscle spasms.  Marland Kitchen zolpidem (AMBIEN) 10 MG tablet Take 1 tablet (10 mg total) by mouth at bedtime as needed for sleep.  . [DISCONTINUED] ondansetron (ZOFRAN-ODT) 4 MG disintegrating tablet Take 4 mg by mouth every 8 (eight) hours as needed.   . [DISCONTINUED] sildenafil (REVATIO) 20 MG tablet Take 40 mg by mouth daily in the afternoon.  . [DISCONTINUED] SUMAtriptan (IMITREX) 100 MG tablet Take 100 mg by mouth every 2 (two) hours as needed.    No Known Allergies No results found for this or any previous visit  (from the past 2160 hour(s)). Objective  Body mass index is 31.24 kg/m. Wt Readings from Last 3 Encounters:  08/05/20 224 lb (101.6 kg)  02/02/20 210 lb (95.3 kg)  01/11/20 210 lb (95.3 kg)   Temp Readings from Last 3 Encounters:  08/05/20 98.3 F (36.8 C) (Oral)  01/11/20 (!) 97.3 F (36.3 C) (Temporal)  09/09/19 98.6 F (37 C) (Oral)   BP Readings from Last 3 Encounters:  08/05/20 106/68  01/11/20 (!) 126/91  09/09/19 (!) 143/84   Pulse Readings from Last 3 Encounters:  08/05/20 75  01/11/20 (!) 57  09/09/19 61    Physical Exam Vitals and nursing note reviewed.  Constitutional:      Appearance: Normal appearance. He is well-developed and well-groomed.  HENT:     Head: Normocephalic and atraumatic.  Eyes:     Conjunctiva/sclera: Conjunctivae normal.     Pupils: Pupils are equal, round, and reactive to light.  Cardiovascular:     Rate and Rhythm: Normal rate and regular rhythm.     Heart sounds: Normal heart sounds. No murmur heard.   Pulmonary:     Effort: Pulmonary effort is normal.     Breath sounds: Normal breath sounds.  Skin:    General: Skin is warm and dry.  Neurological:     General: No focal deficit present.     Mental Status: He is alert and oriented to person, place, and time. Mental status is at baseline.     Gait: Gait normal.  Psychiatric:        Attention and Perception: Attention and perception normal.        Mood and Affect: Mood and affect normal.        Speech: Speech normal.        Behavior: Behavior normal. Behavior is cooperative.        Thought Content: Thought content normal.        Cognition and Memory: Cognition and memory normal.        Judgment: Judgment normal.     Assessment  Plan  Annual physical exam Needs flu shot - Plan: Flu Vaccine QUAD 6+ mos PF IM (Fluarix Quad PF) Flu shotutd given today Tdap utd  Pfizer 2/2 disc booster Declines MMR lab check  covid 2/2 had  HIV neg 05/24/17 PSA, CBC, FSH, LH, test 06/01/20  with endocrine 06/01/20 [place] [facility] Colonoscopy egd had 12/30/17  rec healthy diet and exercise    Hyperlipidemia, unspecified hyperlipidemia type Changed diet and not doing keto any longer  Cont healthy diet and exercise   Erectile dysfunction, unspecified erectile dysfunction type - Plan: sildenafil (REVATIO) 40 MG tablet prn  Migraine without status migrainosus, not intractable, unspecified migraine type - Plan: ondansetron (ZOFRAN-ODT) 4 MG disintegrating tablet, SUMAtriptan (IMITREX) 100 MG tablet  Rx imitrex nasal today 20 -40/24 hrs do not use tablets and nose spray same day disc today  07/07/18 MRI negative   Low testosterone in male Hypogonadism in male [facility] 09/07/20  Labs here fasting  Right knee pain, unspecified chronicity - Plan: Ambulatory referral to Orthopedic Surgery Dr. Roland Rack Cervicalgia - Plan: Ambulatory referral to Orthopedic Surgery Chronic pain of both shoulders - Plan: Ambulatory referral to Orthopedic Surgery  Tinnitus of both ears - Plan: Ambulatory referral to ENT Dr. Kathyrn Sheriff consider hearing check  Provider: Dr. Olivia Mackie McLean-Scocuzza-Internal Medicine

## 2020-08-08 ENCOUNTER — Other Ambulatory Visit: Payer: Self-pay

## 2020-08-08 ENCOUNTER — Other Ambulatory Visit (INDEPENDENT_AMBULATORY_CARE_PROVIDER_SITE_OTHER): Payer: Medicare PPO

## 2020-08-08 ENCOUNTER — Ambulatory Visit (INDEPENDENT_AMBULATORY_CARE_PROVIDER_SITE_OTHER): Payer: Medicare PPO | Admitting: Licensed Clinical Social Worker

## 2020-08-08 DIAGNOSIS — E291 Testicular hypofunction: Secondary | ICD-10-CM

## 2020-08-08 DIAGNOSIS — R7989 Other specified abnormal findings of blood chemistry: Secondary | ICD-10-CM

## 2020-08-08 DIAGNOSIS — F1021 Alcohol dependence, in remission: Secondary | ICD-10-CM

## 2020-08-08 DIAGNOSIS — E538 Deficiency of other specified B group vitamins: Secondary | ICD-10-CM

## 2020-08-08 DIAGNOSIS — F4310 Post-traumatic stress disorder, unspecified: Secondary | ICD-10-CM

## 2020-08-08 DIAGNOSIS — Z0000 Encounter for general adult medical examination without abnormal findings: Secondary | ICD-10-CM

## 2020-08-08 DIAGNOSIS — Z13818 Encounter for screening for other digestive system disorders: Secondary | ICD-10-CM

## 2020-08-08 DIAGNOSIS — Z125 Encounter for screening for malignant neoplasm of prostate: Secondary | ICD-10-CM | POA: Diagnosis not present

## 2020-08-08 DIAGNOSIS — E559 Vitamin D deficiency, unspecified: Secondary | ICD-10-CM | POA: Diagnosis not present

## 2020-08-08 DIAGNOSIS — F3176 Bipolar disorder, in full remission, most recent episode depressed: Secondary | ICD-10-CM

## 2020-08-08 DIAGNOSIS — E785 Hyperlipidemia, unspecified: Secondary | ICD-10-CM | POA: Diagnosis not present
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
## 2020-08-09 ENCOUNTER — Encounter: Payer: Self-pay | Admitting: Internal Medicine

## 2020-08-09 LAB — TESTOSTERONE,FREE AND TOTAL
Testosterone, Free: 17.3 pg/mL (ref 6.8–21.5)
Testosterone: 650 ng/dL (ref 264–916)

## 2020-08-09 LAB — CBC WITH DIFFERENTIAL/PLATELET
Basophils Absolute: 0 10*3/uL (ref 0.0–0.2)
Basos: 1 %
EOS (ABSOLUTE): 0.1 10*3/uL (ref 0.0–0.4)
Eos: 3 %
Hematocrit: 46.6 % (ref 37.5–51.0)
Hemoglobin: 16 g/dL (ref 13.0–17.7)
Immature Grans (Abs): 0 10*3/uL (ref 0.0–0.1)
Immature Granulocytes: 0 %
Lymphocytes Absolute: 1.2 10*3/uL (ref 0.7–3.1)
Lymphs: 36 %
MCH: 31.6 pg (ref 26.6–33.0)
MCHC: 34.3 g/dL (ref 31.5–35.7)
MCV: 92 fL (ref 79–97)
Monocytes Absolute: 0.4 10*3/uL (ref 0.1–0.9)
Monocytes: 12 %
Neutrophils Absolute: 1.6 10*3/uL (ref 1.4–7.0)
Neutrophils: 48 %
Platelets: 204 10*3/uL (ref 150–450)
RBC: 5.06 x10E6/uL (ref 4.14–5.80)
RDW: 12.6 % (ref 11.6–15.4)
WBC: 3.3 10*3/uL — ABNORMAL LOW (ref 3.4–10.8)

## 2020-08-09 LAB — COMPREHENSIVE METABOLIC PANEL
ALT: 46 IU/L — ABNORMAL HIGH (ref 0–44)
AST: 97 IU/L — ABNORMAL HIGH (ref 0–40)
Albumin/Globulin Ratio: 1.8 (ref 1.2–2.2)
Albumin: 4.6 g/dL (ref 4.0–5.0)
Alkaline Phosphatase: 51 IU/L (ref 44–121)
BUN/Creatinine Ratio: 19 (ref 9–20)
BUN: 21 mg/dL (ref 6–24)
Bilirubin Total: 0.9 mg/dL (ref 0.0–1.2)
CO2: 26 mmol/L (ref 20–29)
Calcium: 9.6 mg/dL (ref 8.7–10.2)
Chloride: 103 mmol/L (ref 96–106)
Creatinine, Ser: 1.12 mg/dL (ref 0.76–1.27)
GFR calc Af Amer: 93 mL/min/{1.73_m2} (ref >59)
GFR calc non Af Amer: 81 mL/min/{1.73_m2} (ref >59)
Globulin, Total: 2.6 g/dL (ref 1.5–4.5)
Glucose: 103 mg/dL — ABNORMAL HIGH (ref 65–99)
Potassium: 4.9 mmol/L (ref 3.5–5.2)
Sodium: 140 mmol/L (ref 134–144)
Total Protein: 7.2 g/dL (ref 6.0–8.5)

## 2020-08-09 LAB — LIPID PANEL
Chol/HDL Ratio: 3.3 ratio (ref 0.0–5.0)
Cholesterol, Total: 175 mg/dL (ref 100–199)
HDL: 53 mg/dL (ref >39)
LDL Chol Calc (NIH): 113 mg/dL — ABNORMAL HIGH (ref 0–99)
Triglycerides: 44 mg/dL (ref 0–149)
VLDL Cholesterol Cal: 9 mg/dL (ref 5–40)

## 2020-08-09 LAB — TSH: TSH: 1.52 u[IU]/mL (ref 0.450–4.500)

## 2020-08-09 LAB — VITAMIN B12: Vitamin B-12: 662 pg/mL (ref 232–1245)

## 2020-08-09 LAB — HIGH SENSITIVITY CRP: CRP, High Sensitivity: 1.39 mg/L (ref 0.00–3.00)

## 2020-08-09 LAB — FOLLICLE STIMULATING HORMONE: FSH: 4.9 m[IU]/mL (ref 1.5–12.4)

## 2020-08-09 LAB — T4, FREE: Free T4: 1.32 ng/dL (ref 0.82–1.77)

## 2020-08-09 LAB — LUTEINIZING HORMONE: LH: 4 m[IU]/mL (ref 1.7–8.6)

## 2020-08-09 LAB — PSA: Prostate Specific Ag, Serum: 0.8 ng/mL (ref 0.0–4.0)

## 2020-08-09 LAB — VITAMIN D 25 HYDROXY (VIT D DEFICIENCY, FRACTURES): Vit D, 25-Hydroxy: 49.6 ng/mL (ref 30.0–100.0)

## 2020-08-09 LAB — HEPATITIS C ANTIBODY: Hep C Virus Ab: <0.1 s/co ratio (ref 0.0–0.9)

## 2020-08-12 ENCOUNTER — Telehealth: Payer: Self-pay | Admitting: Internal Medicine

## 2020-08-12 NOTE — Telephone Encounter (Signed)
Pt called to get results.  °

## 2020-08-15 ENCOUNTER — Telehealth: Payer: Self-pay | Admitting: Internal Medicine

## 2020-08-15 ENCOUNTER — Other Ambulatory Visit: Payer: Self-pay

## 2020-08-15 ENCOUNTER — Telehealth (INDEPENDENT_AMBULATORY_CARE_PROVIDER_SITE_OTHER): Payer: Medicare PPO | Admitting: Psychiatry

## 2020-08-15 ENCOUNTER — Other Ambulatory Visit: Payer: Self-pay | Admitting: Internal Medicine

## 2020-08-15 ENCOUNTER — Encounter: Payer: Self-pay | Admitting: Psychiatry

## 2020-08-15 DIAGNOSIS — F1421 Cocaine dependence, in remission: Secondary | ICD-10-CM

## 2020-08-15 DIAGNOSIS — F4310 Post-traumatic stress disorder, unspecified: Secondary | ICD-10-CM

## 2020-08-15 DIAGNOSIS — F1021 Alcohol dependence, in remission: Secondary | ICD-10-CM | POA: Diagnosis not present

## 2020-08-15 DIAGNOSIS — F3176 Bipolar disorder, in full remission, most recent episode depressed: Secondary | ICD-10-CM | POA: Diagnosis not present

## 2020-08-15 DIAGNOSIS — F5105 Insomnia due to other mental disorder: Secondary | ICD-10-CM | POA: Diagnosis not present

## 2020-08-15 DIAGNOSIS — R748 Abnormal levels of other serum enzymes: Secondary | ICD-10-CM

## 2020-08-15 MED ORDER — HYDROXYZINE PAMOATE 50 MG PO CAPS: 50.0000 mg | ORAL_CAPSULE | Freq: Every evening | ORAL | 1 refills | Status: DC | PRN

## 2020-08-15 MED ORDER — BUPROPION HCL ER (XL) 300 MG PO TB24: 300.0000 mg | ORAL_TABLET | Freq: Every day | ORAL | 0 refills | Status: DC

## 2020-08-15 MED ORDER — BUSPIRONE HCL 30 MG PO TABS: 15.0000 mg | ORAL_TABLET | Freq: Every day | ORAL | 0 refills | Status: DC

## 2020-08-15 MED ORDER — BUSPIRONE HCL 30 MG PO TABS: 30.0000 mg | ORAL_TABLET | Freq: Every day | ORAL | 1 refills | Status: DC

## 2020-08-15 MED ORDER — ZOLPIDEM TARTRATE 10 MG PO TABS: 10.0000 mg | ORAL_TABLET | Freq: Every evening | ORAL | 0 refills | Status: DC | PRN

## 2020-08-15 MED ORDER — LAMOTRIGINE 200 MG PO TABS: 200.0000 mg | ORAL_TABLET | Freq: Every day | ORAL | 1 refills | Status: DC

## 2020-08-15 NOTE — Progress Notes (Signed)
Virtual Visit via Telephone Note  I connected with Johnny Villa on 08/15/20 at  3:30 PM EST by telephone and verified that I am speaking with the correct person using two identifiers.  Location Provider Location : [facility] Patient Location : Home  Participants: Patient , Provider   I discussed the limitations, risks, security and privacy concerns of performing an evaluation and management service by telephone and the availability of in person appointments. I also discussed with the patient that there may be a patient responsible charge related to this service. The patient expressed understanding and agreed to proceed  I discussed the assessment and treatment plan with the patient. The patient was provided an opportunity to ask questions and all were answered. The patient agreed with the plan and demonstrated an understanding of the instructions.  The patient was advised to call back or seek an in-person evaluation if the symptoms worsen or if the condition fails to improve as anticipated.  [place] MD OP Progress Note  08/15/2020 4:00 PM Johnny Villa  MRN:  [id number]  Chief Complaint:  Chief Complaint    Follow-up     HPI: Johnny Villa is a 44 year old male, currently lives in [place], disabled, has a history of PTSD, bipolar disorder, insomnia, alcohol use disorder, cocaine use disorder in remission, was evaluated by phone today.  Patient today reports he is currently stable with regards to his mood.  Denies any depression or anxiety symptoms.  He is compliant on medications except for Seroquel which he stopped and BuSpar which he tapered down to 1 tablet a day.  He reports he is doing well at this dosage.  Patient reports sleep as good.  Patient denies any suicidality, homicidality or perceptual disturbances.  Patient reports he is currently attending [place] meetings, meditation groups which helps.  He also has been practicing meditation 20 minutes or so every day.  He met with his  therapist recently and had a good therapy session.  He is motivated to stay in therapy.  He does report that this time of the year his depression usually gets worse but so far he is doing well.  Initially he wanted to be tapered off of the BuSpar however after discussion with writer decided not to since he does have a risk of having depressive episodes during the winter months.  Patient is aware about his elevated liver enzymes and has upcoming appointment for ultrasound.    Visit Diagnosis:    ICD-10-CM   1. PTSD (post-traumatic stress disorder)  F43.10 busPIRone (BUSPAR) 30 MG tablet    DISCONTINUED: busPIRone (BUSPAR) 30 MG tablet   stable  2. Bipolar disorder, in full remission, most recent episode depressed ([place])  F31.76 buPROPion (WELLBUTRIN XL) 300 MG 24 hr tablet  3. Insomnia due to mental condition  F51.05 zolpidem (AMBIEN) 10 MG tablet    hydrOXYzine (VISTARIL) 50 MG capsule  4. Alcohol use disorder, moderate, in early remission ([place])  F10.21   5. Cocaine use disorder, moderate, in sustained remission ([name])  F14.21 lamoTRIgine (LAMICTAL) 200 MG tablet    Past Psychiatric History: I have reviewed past psychiatric history from my progress note on 05/04/2019.  Past trials of Klonopin, Lexapro, Rexulti, Wellbutrin, Lamictal, lithium, BuSpar, Vraylar, Elavil  Past Medical History:  Past Medical History:  Diagnosis Date  . Bipolar disorder [facility])    psychiatrist in [place]  . Blood in stool   . Depression   . Drug abuse in remission [facility])    sober for 6 years  .  Fatty liver 01/2018  . H/O alcohol abuse   . Headache    migraines  . Hypertension   . Low testosterone in male     Past Surgical History:  Procedure Laterality Date  . APPENDECTOMY     2006  . COLONOSCOPY WITH PROPOFOL N/A 12/30/2017   Procedure: COLONOSCOPY WITH PROPOFOL;  Surgeon: Lin Landsman, MD;  Location: [facility] ENDOSCOPY;  Service: Gastroenterology;  Laterality: N/A;  . ESOPHAGOGASTRODUODENOSCOPY (EGD)  WITH PROPOFOL N/A 12/30/2017   Procedure: ESOPHAGOGASTRODUODENOSCOPY (EGD) WITH PROPOFOL;  Surgeon: Lin Landsman, MD;  Location: [facility] ENDOSCOPY;  Service: Gastroenterology;  Laterality: N/A;    Family Psychiatric History: I have reviewed family psychiatric history from my progress note on 05/04/2019  Family History:  Family History  Problem Relation Age of Onset  . Depression Mother   . Alcohol abuse Father   . Cancer Father        prostate dx'ed 59  . COPD Father   . Hyperlipidemia Father   . Hypertension Father     Social History: Reviewed social history from my progress note on 05/04/2019 Social History   Socioeconomic History  . Marital status: Single    Spouse name: Not on file  . Number of children: Not on file  . Years of education: Not on file  . Highest education level: Not on file  Occupational History  . Occupation: disabled  Tobacco Use  . Smoking status: Former Smoker    Packs/day: 0.25    Years: 2.00    Pack years: 0.50    Types: Cigarettes    Quit date: 1999    Years since quitting: 22.8  . Smokeless tobacco: Former Systems developer    Types: Secondary school teacher  . Vaping Use: Former  Substance and Sexual Activity  . Alcohol use: Not Currently    Comment: former quit 03/14/2012  . Drug use: Not Currently    Comment: former quit cocaine 03/14/2012   . Sexual activity: Yes  Other Topics Concern  . 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 Difficulty of Paying Living Expenses: Not on file  Food Insecurity:   . Worried About Charity fundraiser in the Last Year: Not on file  . Ran Out of Food in the Last Year: Not on file  Transportation Needs:   . Lack of Transportation (Medical): Not on file  . Lack of Transportation (Non-Medical): Not on file  Physical Activity:   .  Days of Exercise per Week: Not on file  . Minutes of Exercise per Session: Not on file  Stress:   . Feeling of Stress : Not on file  Social Connections:   . Frequency of Communication with Friends and Family: Not on file  . Frequency of Social Gatherings with Friends and Family: Not on file  . Attends Religious Services: Not on file  . Active Member of Clubs or Organizations: Not on file  . Attends Archivist Meetings: Not on file  . Marital Status: Not on file    Allergies: No Known Allergies  Metabolic Disorder Labs: Lab Results  Component Value Date   HGBA1C 5.6 02/26/2020   MPG 114.02 09/13/2019   No results found for:  PROLACTIN Lab Results  Component Value Date   CHOL 175 08/08/2020   TRIG 44 08/08/2020   HDL 53 08/08/2020   CHOLHDL 3.3 08/08/2020   VLDL 11.6 02/26/2020   LDLCALC 113 (H) 08/08/2020   LDLCALC 158 (H) 02/26/2020   Lab Results  Component Value Date   TSH 1.520 08/08/2020   TSH 1.76 08/10/2019    Therapeutic Level Labs: Lab Results  Component Value Date   LITHIUM 0.4 (L) 06/23/2018   LITHIUM 0.8 10/25/2017   No results found for: VALPROATE No components found for:  CBMZ  Current Medications: Current Outpatient Medications  Medication Sig Dispense Refill  . buPROPion (WELLBUTRIN XL) 300 MG 24 hr tablet Take 1 tablet (300 mg total) by mouth daily. 90 tablet 0  . busPIRone (BUSPAR) 30 MG tablet Take 1 tablet (30 mg total) by mouth daily. 90 tablet 1  . clomiPHENE (CLOMID) 50 MG tablet Take by mouth.    . gabapentin (NEURONTIN) 600 MG tablet Take 1 tablet (600 mg total) by mouth 3 (three) times daily. 90 tablet 5  . hydrOXYzine (VISTARIL) 50 MG capsule Take 1 capsule (50 mg total) by mouth at bedtime as needed. For sleep 90 capsule 1  . lamoTRIgine (LAMICTAL) 200 MG tablet Take 1 tablet (200 mg total) by mouth daily. 90 tablet 1  . ondansetron (ZOFRAN-ODT) 4 MG disintegrating tablet Take 1 tablet (4 mg total) by mouth every 8 (eight)  hours as needed. 30 tablet 11  . sildenafil (REVATIO) 20 MG tablet Take 2 tablets (40 mg total) by mouth daily in the afternoon. 60 tablet 11  . SUMAtriptan (IMITREX) 100 MG tablet Take 1 tablet (100 mg total) by mouth every 2 (two) hours as needed. Max dose 200 mg/24 hours. Limit to 2x per week 9 tablet 11  . zolpidem (AMBIEN) 10 MG tablet Take 1 tablet (10 mg total) by mouth at bedtime as needed for sleep. 90 tablet 0   No current facility-administered medications for this visit.     Musculoskeletal: Strength & Muscle Tone: UTA Gait & Station: UTA Patient leans: N/A  Psychiatric Specialty Exam: Review of Systems  Psychiatric/Behavioral: Negative for agitation, behavioral problems, confusion, decreased concentration, dysphoric mood, hallucinations, self-injury, sleep disturbance and suicidal ideas. The patient is not nervous/anxious and is not hyperactive.   All other systems reviewed and are negative.   There were no vitals taken for this visit.There is no height or weight on file to calculate BMI.  General Appearance: UTA  Eye Contact:  UTA  Speech:  Clear and Coherent  Volume:  Normal  Mood:  Euthymic  Affect:  UTA  Thought Process:  Goal Directed and Descriptions of Associations: Intact  Orientation:  Full (Time, Place, and Person)  Thought Content: Logical   Suicidal Thoughts:  No  Homicidal Thoughts:  No  Memory:  Immediate;   Fair Recent;   Fair Remote;   Fair  Judgement:  Fair  Insight:  Fair  Psychomotor Activity:  UTA  Concentration:  Concentration: Fair and Attention Span: Fair  Recall:  [employer] of Knowledge: Fair  Language: Fair  Akathisia:  No  Handed:  Right  AIMS (if indicated): UTA  Assets:  Communication Skills Desire for Improvement Housing Social Support  ADL's:  Intact  Cognition: WNL  Sleep:  Fair   Screenings: AIMS     Admission (Discharged) from 09/09/2019 in [place] Total Score 0    AUDIT      Admission (  Discharged) from 09/09/2019 in [place] Admission (Discharged) from 06/01/2019 in [place]  Alcohol Use Disorder Identification Test Final Score (AUDIT) 40 33    GAD-7     Video Visit from 02/02/2020 in [facility]  Total GAD-7 Score 0    PHQ2-9     Counselor from 08/08/2020 in [place] Video Visit from 02/02/2020 in [facility] Office Visit from 07/09/2019 in [place] from 06/26/2019 in [place] Office Visit from 06/24/2018 in [place]  PHQ-2 Total Score 0 0 2 0 0  PHQ-9 Total Score 0 -- 4 -- --       Assessment and Plan: Johnny Villa is a 44 year old male, divorced, lives in [place], has a history of PTSD, bipolar disorder, alcohol and cocaine use disorder, migraine headaches was evaluated by telemedicine today.  Patient is biologically predisposed given his history of substance abuse problems in the past as well as history of trauma.  Patient is currently stable on medications.  Plan as noted below.  Plan PTSD-stable BuSpar 30 mg p.o. daily. Ambien 10 mg p.o. nightly as needed Hydroxyzine 50 mg p.o. nightly as needed Discontinue Seroquel. Continue CBT with Ms.Hussami  Bipolar disorder in remission Lamictal 200 mg p.o. daily Wellbutrin XL 300 mg p.o. daily  Alcohol use disorder in remission Continue AA meetings  Cocaine use disorder in remission Patient continues to be sober.  Patient has upcoming appointment for ultrasound for elevated liver enzymes.  Follow-up in clinic in 3 to 4 weeks or sooner if needed.  I have spent atleast 20 minutes non face to face  with patient today. More than 50 % of the time was spent for preparing to see the patient ( e.g., review of test, records ), ordering medications and test ,psychoeducation and  supportive psychotherapy and care coordination,as well as documenting clinical information in electronic health record. This note was generated in part or whole with voice recognition software. Voice recognition is usually quite accurate but there are transcription errors that can and very often do occur. I apologize for any typographical errors that were not detected and corrected.     Ursula Alert, MD 08/15/2020, 4:00 PM

## 2020-08-15 NOTE — Telephone Encounter (Signed)
Patient calling back in on getting an ultrasound ordered.   In accordance to the message below:  Johnny Villa, [facility]  08/12/2020 4:01 PM EST Back to [place] and spoke to [place]. Johnny Villa verbalized understanding and had no further questions. He states that he is no longer on seroquel and the gabapentin was new. He started the gabapentin around 4 months ago. He is agreeable to the Liver Ultrasound. He stopped taking all of his supplements. Labs have been faxed to Toni Arthurs at [phone] .   Please advise

## 2020-08-17 DIAGNOSIS — H9319 Tinnitus, unspecified ear: Secondary | ICD-10-CM | POA: Diagnosis not present

## 2020-08-17 DIAGNOSIS — H9313 Tinnitus, bilateral: Secondary | ICD-10-CM | POA: Diagnosis not present

## 2020-08-19 ENCOUNTER — Telehealth: Payer: Self-pay | Admitting: Internal Medicine

## 2020-08-19 NOTE — Telephone Encounter (Signed)
Left message for patient to call back and schedule Medicare Annual Wellness Visit (AWV)  °  °This should be a virtual or telephone visit only=30 minutes. °  °Last AWV 06/26/19; please schedule at anytime with Denisa O'Brien-Blaney at [place]. °

## 2020-08-22 ENCOUNTER — Other Ambulatory Visit: Payer: Self-pay

## 2020-08-22 ENCOUNTER — Ambulatory Visit
Admission: RE | Admit: 2020-08-22 | Discharge: 2020-08-22 | Disposition: A | Discharge status: Home / Self Care | Payer: Medicare PPO | Source: Ambulatory Visit | Attending: Internal Medicine | Admitting: Internal Medicine

## 2020-08-22 ENCOUNTER — Telehealth: Payer: Self-pay | Admitting: Physical Therapy

## 2020-08-22 DIAGNOSIS — R748 Abnormal levels of other serum enzymes: Secondary | ICD-10-CM | POA: Diagnosis not present

## 2020-08-22 DIAGNOSIS — R7989 Other specified abnormal findings of blood chemistry: Secondary | ICD-10-CM | POA: Diagnosis not present

## 2020-08-22 NOTE — Telephone Encounter (Signed)
Called patient to see if he wanted to move his initial PT eval up due to opening now available on 08/23/20 evening. He answered and identified himself with full name and date of birth. He stated he was not aware he had been scheduled for PT and did not feel he needs it at this time. He requested to cancel all appointments that had been made. He also wanted to know who ordered it, so I informed him the referral with have is from Dr. Holley Raring at the pain clinic. Agreed I would have his appointments canceled. Updated office staff.   Everlean Alstrom. Graylon Good, PT, DPT 08/22/20, 1:30 PM

## 2020-08-23 ENCOUNTER — Other Ambulatory Visit: Payer: Self-pay

## 2020-08-23 DIAGNOSIS — R748 Abnormal levels of other serum enzymes: Secondary | ICD-10-CM

## 2020-08-23 DIAGNOSIS — S43431A Superior glenoid labrum lesion of right shoulder, initial encounter: Secondary | ICD-10-CM | POA: Diagnosis not present

## 2020-08-23 DIAGNOSIS — M7581 Other shoulder lesions, right shoulder: Secondary | ICD-10-CM | POA: Diagnosis not present

## 2020-08-23 DIAGNOSIS — S46811A Strain of other muscles, fascia and tendons at shoulder and upper arm level, right arm, initial encounter: Secondary | ICD-10-CM | POA: Diagnosis not present

## 2020-08-23 DIAGNOSIS — M25511 Pain in right shoulder: Secondary | ICD-10-CM | POA: Diagnosis not present

## 2020-08-23 DIAGNOSIS — M898X1 Other specified disorders of bone, shoulder: Secondary | ICD-10-CM | POA: Diagnosis not present

## 2020-08-24 ENCOUNTER — Other Ambulatory Visit: Payer: Self-pay | Admitting: Student

## 2020-08-24 DIAGNOSIS — S43431A Superior glenoid labrum lesion of right shoulder, initial encounter: Secondary | ICD-10-CM

## 2020-08-24 DIAGNOSIS — S46811A Strain of other muscles, fascia and tendons at shoulder and upper arm level, right arm, initial encounter: Secondary | ICD-10-CM

## 2020-08-24 DIAGNOSIS — M7581 Other shoulder lesions, right shoulder: Secondary | ICD-10-CM

## 2020-08-30 ENCOUNTER — Other Ambulatory Visit: Payer: Self-pay

## 2020-08-30 ENCOUNTER — Ambulatory Visit (INDEPENDENT_AMBULATORY_CARE_PROVIDER_SITE_OTHER): Payer: Medicare PPO | Admitting: Licensed Clinical Social Worker

## 2020-08-30 DIAGNOSIS — F4310 Post-traumatic stress disorder, unspecified: Secondary | ICD-10-CM

## 2020-08-30 DIAGNOSIS — F1021 Alcohol dependence, in remission: Secondary | ICD-10-CM

## 2020-08-30 DIAGNOSIS — F3176 Bipolar disorder, in full remission, most recent episode depressed: Secondary | ICD-10-CM | POA: Diagnosis not present

## 2020-08-30 NOTE — Progress Notes (Signed)
Virtual Visit via Video Note  I connected with Johnny Villa on 08/30/20 at  2:30 PM EST by a video enabled telemedicine application and verified that I am speaking with the correct person using two identifiers.  Location: Patient: home Provider: [facility]   I discussed the limitations of evaluation and management by telemedicine and the availability of in person appointments. The patient expressed understanding and agreed to proceed.    The patient was advised to call back or seek an in-person evaluation if the symptoms worsen or if the condition fails to improve as anticipated.  I provided 60 minutes of non-face-to-face time during this encounter.   Adebayo Ensminger R Albirtha Grinage, LCSW    THERAPIST PROGRESS NOTE  Session Time: 2:30-3:30p  Participation Level: Active  Behavioral Response: NeatAlertAnxious  Type of Therapy: Individual Therapy  Treatment Goals addressed: Coping  Interventions: Motivational Interviewing and Supportive  Summary: Johnny Villa is a 44 y.o. male who presents with improving symptoms related to PTSD, bipolar disorder, and alcohol use disorder diagnoses. Pt reports that he is compliant with medication, compliant with AA meeting attendance, and daily exercise.   Allowed pt to explore and express thoughts and feelings associated with self, family members, and previous trauma. Allowed pt to do self-reflection and discuss triggers to alcohol use and importance of personal boundaries.   Encouraged pt to continue focusing on self care, daily routine, sobriety maintenance, and setting healthy boundaries with others.   Suicidal/Homicidal: No  Therapist Response: Johnny Villa reports that he is making good progress with self understanding, self care, life management, and continuing sobriety. Developments continue in the areas of family and relational functioning.   Plan: Return again in 4 weeks. The ongoing treatment plan includes maintaining current levels of progress and  continuing to build skills to manage mood, improve stress/anxiety management, emotion regulation, distress tolerance, and behavior modification.   Diagnosis: Axis I: Alcohol Abuse, Bipolar, Depressed and Post Traumatic Stress Disorder    Axis II: No diagnosis    [place], LCSW 08/30/2020

## 2020-08-31 ENCOUNTER — Ambulatory Visit (HOSPITAL_COMMUNITY): Payer: Medicare PPO

## 2020-08-31 DIAGNOSIS — Z79899 Other long term (current) drug therapy: Secondary | ICD-10-CM | POA: Diagnosis not present

## 2020-08-31 DIAGNOSIS — E230 Hypopituitarism: Secondary | ICD-10-CM | POA: Diagnosis not present

## 2020-09-01 ENCOUNTER — Ambulatory Visit: Payer: Medicare PPO | Admitting: Physical Therapy

## 2020-09-02 ENCOUNTER — Other Ambulatory Visit: Payer: Self-pay

## 2020-09-02 ENCOUNTER — Other Ambulatory Visit (INDEPENDENT_AMBULATORY_CARE_PROVIDER_SITE_OTHER): Payer: Medicare PPO

## 2020-09-02 DIAGNOSIS — R748 Abnormal levels of other serum enzymes: Secondary | ICD-10-CM | POA: Diagnosis not present

## 2020-09-02 LAB — HEPATIC FUNCTION PANEL
ALT: 33 U/L (ref 0–53)
AST: 23 U/L (ref 0–37)
Albumin: 4.3 g/dL (ref 3.5–5.2)
Alkaline Phosphatase: 58 U/L (ref 39–117)
Bilirubin, Direct: 0.1 mg/dL (ref 0.0–0.3)
Total Bilirubin: 0.8 mg/dL (ref 0.2–1.2)
Total Protein: 6.8 g/dL (ref 6.0–8.3)

## 2020-09-06 ENCOUNTER — Encounter: Payer: Medicare PPO | Admitting: Physical Therapy

## 2020-09-07 DIAGNOSIS — E230 Hypopituitarism: Secondary | ICD-10-CM | POA: Diagnosis not present

## 2020-09-07 DIAGNOSIS — Z79899 Other long term (current) drug therapy: Secondary | ICD-10-CM | POA: Diagnosis not present

## 2020-09-08 ENCOUNTER — Encounter: Payer: Medicare PPO | Admitting: Physical Therapy

## 2020-09-12 ENCOUNTER — Encounter: Payer: Medicare PPO | Admitting: Physical Therapy

## 2020-09-14 ENCOUNTER — Ambulatory Visit
Admission: RE | Admit: 2020-09-14 | Discharge: 2020-09-14 | Disposition: A | Discharge status: Home / Self Care | Payer: Medicare PPO | Source: Ambulatory Visit | Attending: Student | Admitting: Student

## 2020-09-14 ENCOUNTER — Telehealth (INDEPENDENT_AMBULATORY_CARE_PROVIDER_SITE_OTHER): Payer: Medicare PPO | Admitting: Psychiatry

## 2020-09-14 ENCOUNTER — Encounter: Payer: Self-pay | Admitting: Psychiatry

## 2020-09-14 ENCOUNTER — Encounter: Payer: Medicare PPO | Admitting: Physical Therapy

## 2020-09-14 ENCOUNTER — Other Ambulatory Visit: Payer: Self-pay

## 2020-09-14 DIAGNOSIS — S43431A Superior glenoid labrum lesion of right shoulder, initial encounter: Secondary | ICD-10-CM | POA: Insufficient documentation

## 2020-09-14 DIAGNOSIS — F5105 Insomnia due to other mental disorder: Secondary | ICD-10-CM

## 2020-09-14 DIAGNOSIS — M7581 Other shoulder lesions, right shoulder: Secondary | ICD-10-CM

## 2020-09-14 DIAGNOSIS — Y999 Unspecified external cause status: Secondary | ICD-10-CM | POA: Insufficient documentation

## 2020-09-14 DIAGNOSIS — S46811A Strain of other muscles, fascia and tendons at shoulder and upper arm level, right arm, initial encounter: Secondary | ICD-10-CM

## 2020-09-14 DIAGNOSIS — F1021 Alcohol dependence, in remission: Secondary | ICD-10-CM | POA: Diagnosis not present

## 2020-09-14 DIAGNOSIS — F4310 Post-traumatic stress disorder, unspecified: Secondary | ICD-10-CM | POA: Diagnosis not present

## 2020-09-14 DIAGNOSIS — F3176 Bipolar disorder, in full remission, most recent episode depressed: Secondary | ICD-10-CM

## 2020-09-14 DIAGNOSIS — F1421 Cocaine dependence, in remission: Secondary | ICD-10-CM

## 2020-09-14 DIAGNOSIS — M25511 Pain in right shoulder: Secondary | ICD-10-CM | POA: Diagnosis not present

## 2020-09-14 MED ORDER — SODIUM CHLORIDE (PF) 0.9 % IJ SOLN
10.0000 mL | INTRAMUSCULAR | Status: DC | PRN
  Administered 2020-09-14: 5 mL

## 2020-09-14 MED ORDER — GADOBUTROL 1 MMOL/ML IV SOLN
2.0000 mL | Freq: Once | INTRAVENOUS | Status: AC | PRN
  Administered 2020-09-14: 0.05 mL

## 2020-09-14 MED ORDER — IOHEXOL 180 MG/ML  SOLN
20.0000 mL | Freq: Once | INTRAMUSCULAR | Status: AC | PRN
  Administered 2020-09-14: 15 mL

## 2020-09-14 MED ORDER — LIDOCAINE HCL (PF) 1 % IJ SOLN
5.0000 mL | Freq: Once | INTRAMUSCULAR | Status: AC
  Administered 2020-09-14: 5 mL
  Filled 2020-09-14: qty 5

## 2020-09-14 NOTE — Progress Notes (Signed)
Virtual Visit via Video Note  I connected with Johnny Villa on 09/14/20 at  3:00 PM EST by a video enabled telemedicine application and verified that I am speaking with the correct person using two identifiers.  Location Provider Location : [facility] Patient Location : Home  Participants: Patient , Provider    I discussed the limitations of evaluation and management by telemedicine and the availability of in person appointments. The patient expressed understanding and agreed to proceed.    I discussed the assessment and treatment plan with the patient. The patient was provided an opportunity to ask questions and all were answered. The patient agreed with the plan and demonstrated an understanding of the instructions.   The patient was advised to call back or seek an in-person evaluation if the symptoms worsen or if the condition fails to improve as anticipated.   [place] MD OP Progress Note  09/15/2020 9:33 AM Johnny Villa  MRN:  [id number]  Chief Complaint:  Chief Complaint    Follow-up     HPI: Johnny Villa is a 44 year old male, currently lives in [place], disabled, has a history of PTSD, bipolar disorder in remission, insomnia, alcohol use disorder, cocaine use disorder in remission who was evaluated by telemedicine today.  Patient today reports he is currently doing well.  Denies any significant mood lability.  He reports he feels happy.  He denies any euphoria.  He reports sleep continues to be good.  Patient denies any suicidality, homicidality or perceptual disturbances.  He continues to attend AA meetings every day.  He continues to stay sober from alcohol and other illicit substances.  Patient reports he is compliant on medications and denies side effects.  He is tolerating the lower dosage of BuSpar well.  Patient had ultrasound liver as well as repeat liver function done recently and all of his liver function came back normal.  Patient reports he does not know  what clearly happened since his liver abnormalities corrected itself within a few weeks.  He reports he is currently struggling with right shoulder pain, flareup from a previous injury that had happened due to playing sports several years ago.  He is currently taking Mobic.  Patient denies any other concerns today.  Visit Diagnosis:    ICD-10-CM   1. PTSD (post-traumatic stress disorder)  F43.10   2. Bipolar disorder, in full remission, most recent episode depressed ([place])  F31.76   3. Insomnia due to mental condition  F51.05   4. Alcohol use disorder, moderate, in early remission ([place])  F10.21   5. Cocaine use disorder, moderate, in sustained remission ([place])  F14.21     Past Psychiatric History: I have reviewed past psychiatric history from my progress note on 05/04/2019.  Past trials of Klonopin, Lexapro, Rexulti, Wellbutrin, Lamictal, lithium, BuSpar, Vraylar, Elavil  Past Medical History:  Past Medical History:  Diagnosis Date  . Bipolar disorder [facility])    psychiatrist in [place]  . Blood in stool   . Depression   . Drug abuse in remission [facility])    sober for 6 years  . Fatty liver 01/2018  . H/O alcohol abuse   . Headache    migraines  . Hypertension   . Low testosterone in male     Past Surgical History:  Procedure Laterality Date  . APPENDECTOMY     2006  . COLONOSCOPY WITH PROPOFOL N/A 12/30/2017   Procedure: COLONOSCOPY WITH PROPOFOL;  Surgeon: Lin Landsman, MD;  Location: [facility] ENDOSCOPY;  Service: Gastroenterology;  Laterality: N/A;  . ESOPHAGOGASTRODUODENOSCOPY (EGD) WITH PROPOFOL N/A 12/30/2017   Procedure: ESOPHAGOGASTRODUODENOSCOPY (EGD) WITH PROPOFOL;  Surgeon: Lin Landsman, MD;  Location: [facility] ENDOSCOPY;  Service: Gastroenterology;  Laterality: N/A;    Family Psychiatric History: I have reviewed family psychiatric history from my progress note on 05/04/2019  Family History:  Family History  Problem Relation Age of Onset  . Depression Mother   .  Alcohol abuse Father   . Cancer Father        prostate dx'ed 44  . COPD Father   . Hyperlipidemia Father   . Hypertension Father     Social History: Reviewed social history from my progress note on 05/31/2019 Social History   Socioeconomic History  . Marital status: Single    Spouse name: Not on file  . Number of children: Not on file  . Years of education: Not on file  . Highest education level: Not on file  Occupational History  . Occupation: disabled  Tobacco Use  . Smoking status: Former Smoker    Packs/day: 0.25    Years: 2.00    Pack years: 0.50    Types: Cigarettes    Quit date: 1999    Years since quitting: 22.9  . Smokeless tobacco: Former Systems developer    Types: Secondary school teacher  . Vaping Use: Former  Substance and Sexual Activity  . Alcohol use: Not Currently    Comment: former quit 03/14/2012  . Drug use: Not Currently    Comment: former quit cocaine 03/14/2012   . Sexual activity: Yes  Other Topics Concern  . 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    Social Determinants of Health   Financial Resource Strain: Not on file  Food Insecurity: Not on file  Transportation Needs: Not on file  Physical Activity: Not on file  Stress: Not on file  Social Connections: Not on file    Allergies: No Known Allergies  Metabolic Disorder Labs: Lab Results  Component Value Date   HGBA1C 5.6 02/26/2020   MPG 114.02 09/13/2019   No results found for: PROLACTIN Lab Results  Component Value Date   CHOL 175 08/08/2020   TRIG 44 08/08/2020   HDL 53 08/08/2020   CHOLHDL 3.3 08/08/2020   VLDL 11.6 02/26/2020   LDLCALC 113 (H) 08/08/2020   LDLCALC 158 (H) 02/26/2020   Lab Results  Component Value Date   TSH 1.520 08/08/2020   TSH 1.76 08/10/2019    Therapeutic Level Labs: Lab Results  Component Value Date   LITHIUM 0.4 (L) 06/23/2018    LITHIUM 0.8 10/25/2017   No results found for: VALPROATE No components found for:  CBMZ  Current Medications: Current Outpatient Medications  Medication Sig Dispense Refill  . meloxicam (MOBIC) 15 MG tablet Take by mouth.    . methocarbamol (ROBAXIN) 750 MG tablet Take by mouth.    Marland Kitchen buPROPion (WELLBUTRIN XL) 300 MG 24 hr tablet Take 1 tablet (300 mg total) by mouth daily. 90 tablet 0  . busPIRone (BUSPAR) 30 MG tablet Take 1 tablet (30 mg total) by mouth daily. 90 tablet 1  . clomiPHENE (CLOMID) 50 MG tablet Take by mouth.    . gabapentin (NEURONTIN) 600 MG tablet Take 1 tablet (600 mg total) by mouth 3 (three) times daily. [place]  tablet 5  . hydrOXYzine (VISTARIL) 50 MG capsule Take 1 capsule (50 mg total) by mouth at bedtime as needed. For sleep 90 capsule 1  . lamoTRIgine (LAMICTAL) 200 MG tablet Take 1 tablet (200 mg total) by mouth daily. 90 tablet 1  . ondansetron (ZOFRAN-ODT) 4 MG disintegrating tablet Take 1 tablet (4 mg total) by mouth every 8 (eight) hours as needed. 30 tablet 11  . sildenafil (REVATIO) 20 MG tablet Take 2 tablets (40 mg total) by mouth daily in the afternoon. 60 tablet 11  . SUMAtriptan (IMITREX) 100 MG tablet Take 1 tablet (100 mg total) by mouth every 2 (two) hours as needed. Max dose 200 mg/24 hours. Limit to 2x per week 9 tablet 11  . zolpidem (AMBIEN) 10 MG tablet Take 1 tablet (10 mg total) by mouth at bedtime as needed for sleep. 90 tablet 0   No current facility-administered medications for this visit.     Musculoskeletal: Strength & Muscle Tone: UTA Gait & Station: normal Patient leans: N/A  Psychiatric Specialty Exam: Review of Systems  Musculoskeletal:       Rt.sided shoulder pain  Psychiatric/Behavioral: Negative for agitation, behavioral problems, confusion, decreased concentration, dysphoric mood, hallucinations, self-injury, sleep disturbance and suicidal ideas. The patient is not nervous/anxious and is not hyperactive.   All other systems  reviewed and are negative.   There were no vitals taken for this visit.There is no height or weight on file to calculate BMI.  General Appearance: Casual  Eye Contact:  Fair  Speech:  Normal Rate  Volume:  Normal  Mood:  Euthymic  Affect:  Congruent  Thought Process:  Goal Directed and Descriptions of Associations: Intact  Orientation:  Full (Time, Place, and Person)  Thought Content: Logical   Suicidal Thoughts:  No  Homicidal Thoughts:  No  Memory:  Immediate;   Fair Recent;   Fair Remote;   Fair  Judgement:  Fair  Insight:  Fair  Psychomotor Activity:  Normal  Concentration:  Concentration: Fair and Attention Span: Fair  Recall:  [employer] of Knowledge: Fair  Language: Fair  Akathisia:  No  Handed:  Right  AIMS (if indicated): UTA  Assets:  Communication Skills Desire for Improvement Housing Social Support  ADL's:  Intact  Cognition: WNL  Sleep:  Fair   Screenings: AIMS   Flowsheet Row Admission (Discharged) from 09/09/2019 in [place] Total Score 0    AUDIT   Flowsheet Row Admission (Discharged) from 09/09/2019 in [place] Admission (Discharged) from 06/01/2019 in [place]  Alcohol Use Disorder Identification Test Final Score (AUDIT) 40 33    GAD-7   Flowsheet Row Video Visit from 02/02/2020 in [facility]  Total GAD-7 Score 0    PHQ2-9   Flowsheet Row Counselor from 08/08/2020 in [place] Video Visit from 02/02/2020 in [facility] Office Visit from 07/09/2019 in [place] from 06/26/2019 in [place] Office Visit from 06/24/2018 in [place]  PHQ-2 Total Score 0 0 2 0 0  PHQ-9 Total Score 0 -- 4 -- --       Assessment and Plan: Johnny Villa is a 44 year old male, divorced, lives in [place],  has a history of PTSD, bipolar disorder, alcohol and cocaine use disorder, migraine headache was evaluated by telemedicine today.  Patient is biologically predisposed given his history of substance abuse  problems, history of trauma.  Patient is currently stable on current medication regimen.  Plan as noted below.  Plan PTSD-stable BuSpar 30 mg p.o. daily Ambien 10 mg p.o. nightly as needed Hydroxyzine 50 mg p.o. nightly as needed Continue CBT with Ms. Kandice Moos   Bipolar disorder in remission Lamictal 200 mg p.o. daily Wellbutrin XL 300 mg p.o. daily  Alcohol use disorder in remission Continue AA meetings  Cocaine use disorder in remission Patient continues to stay sober  Patient had recent repeat liver enzymes done which came back within normal limits-reviewed and discussed-dated 09/02/2020-AST/ALT-within normal limits.  Follow-up in clinic in 2 months or sooner if needed.  I have spent atleast 20 minutes face to face by video with patient today. More than 50 % of the time was spent for preparing to see the patient ( e.g., review of test, records ),  ordering medications and test ,psychoeducation and supportive psychotherapy and care coordination,as well as documenting clinical information in electronic health record,interpreting and communication of test results This note was generated in part or whole with voice recognition software. Voice recognition is usually quite accurate but there are transcription errors that can and very often do occur. I apologize for any typographical errors that were not detected and corrected.       Ursula Alert, MD 09/15/2020, 9:33 AM

## 2020-09-19 ENCOUNTER — Encounter: Payer: Medicare PPO | Admitting: Physical Therapy

## 2020-09-21 DIAGNOSIS — Z03818 Encounter for observation for suspected exposure to other biological agents ruled out: Secondary | ICD-10-CM | POA: Diagnosis not present

## 2020-09-21 DIAGNOSIS — Z20822 Contact with and (suspected) exposure to covid-19: Secondary | ICD-10-CM | POA: Diagnosis not present

## 2020-09-22 ENCOUNTER — Encounter: Payer: Medicare PPO | Admitting: Physical Therapy

## 2020-09-29 ENCOUNTER — Encounter: Payer: Medicare PPO | Admitting: Physical Therapy

## 2020-10-03 ENCOUNTER — Encounter: Payer: Medicare PPO | Admitting: Physical Therapy

## 2020-10-05 ENCOUNTER — Encounter: Payer: Medicare PPO | Admitting: Physical Therapy

## 2020-10-06 ENCOUNTER — Ambulatory Visit: Payer: Medicare PPO | Admitting: Licensed Clinical Social Worker

## 2020-10-10 ENCOUNTER — Encounter: Payer: Medicare PPO | Admitting: Physical Therapy

## 2020-10-10 DIAGNOSIS — S43431A Superior glenoid labrum lesion of right shoulder, initial encounter: Secondary | ICD-10-CM | POA: Insufficient documentation

## 2020-10-10 DIAGNOSIS — M7521 Bicipital tendinitis, right shoulder: Secondary | ICD-10-CM | POA: Diagnosis not present

## 2020-10-12 ENCOUNTER — Encounter: Payer: Medicare PPO | Admitting: Physical Therapy

## 2020-10-17 ENCOUNTER — Encounter: Payer: Medicare PPO | Admitting: Physical Therapy

## 2020-10-19 ENCOUNTER — Encounter: Payer: Medicare PPO | Admitting: Physical Therapy

## 2020-10-24 ENCOUNTER — Encounter: Payer: Medicare PPO | Admitting: Physical Therapy

## 2020-10-26 ENCOUNTER — Encounter: Payer: Medicare PPO | Admitting: Physical Therapy

## 2020-11-15 ENCOUNTER — Other Ambulatory Visit: Payer: Self-pay | Admitting: Psychiatry

## 2020-11-15 DIAGNOSIS — F5105 Insomnia due to other mental disorder: Secondary | ICD-10-CM

## 2020-11-17 ENCOUNTER — Telehealth (INDEPENDENT_AMBULATORY_CARE_PROVIDER_SITE_OTHER): Payer: Medicare Other | Admitting: Psychiatry

## 2020-11-17 ENCOUNTER — Encounter: Payer: Self-pay | Admitting: Psychiatry

## 2020-11-17 ENCOUNTER — Other Ambulatory Visit: Payer: Self-pay

## 2020-11-17 DIAGNOSIS — F3176 Bipolar disorder, in full remission, most recent episode depressed: Secondary | ICD-10-CM

## 2020-11-17 DIAGNOSIS — F5105 Insomnia due to other mental disorder: Secondary | ICD-10-CM

## 2020-11-17 DIAGNOSIS — Z634 Disappearance and death of family member: Secondary | ICD-10-CM | POA: Diagnosis not present

## 2020-11-17 DIAGNOSIS — F1021 Alcohol dependence, in remission: Secondary | ICD-10-CM

## 2020-11-17 DIAGNOSIS — F1421 Cocaine dependence, in remission: Secondary | ICD-10-CM

## 2020-11-17 DIAGNOSIS — F4310 Post-traumatic stress disorder, unspecified: Secondary | ICD-10-CM

## 2020-11-17 MED ORDER — BUSPIRONE HCL 30 MG PO TABS
30.0000 mg | ORAL_TABLET | Freq: Two times a day (BID) | ORAL | 1 refills | Status: DC
Start: 2020-11-17 — End: 2020-11-22

## 2020-11-17 NOTE — Progress Notes (Signed)
Virtual Visit via Video Note  I connected with Johnny Villa on 11/17/20 at  2:40 PM EST by a video enabled telemedicine application and verified that I am speaking with the correct person using two identifiers.  Location Provider Location : [facility] Patient Location : Home  Participants: Patient , Provider    I discussed the limitations of evaluation and management by telemedicine and the availability of in person appointments. The patient expressed understanding and agreed to proceed.   I discussed the assessment and treatment plan with the patient. The patient was provided an opportunity to ask questions and all were answered. The patient agreed with the plan and demonstrated an understanding of the instructions.   The patient was advised to call back or seek an in-person evaluation if the symptoms worsen or if the condition fails to improve as anticipated.  [place] MD OP Progress Note  11/17/2020 3:22 PM Johnny Villa  MRN:  [id number]  Chief Complaint:  Chief Complaint    Follow-up     HPI: Johnny Villa is a 45 year old male, currently lives in [place], disabled, has a history of PTSD, bipolar disorder in remission, insomnia, alcohol use disorder, cocaine use disorder in remission was evaluated by telemedicine today.  Patient today reports his dearest grandmother passed away recently.  Patient reports he was by her side when she died.  Patient reports he got time to spend time with his family during the funeral and that also helped.  Patient however reports he has noticed his social anxiety is getting worse the past few weeks.  He may have done better when he was on the higher dosage of BuSpar.  He wonders whether he can go back on it.  Patient reports sleep is overall okay.  He continues to stay away from alcohol and any other illicit drugs and reports he continues to be in [place] meetings 2-3 times a day.  Patient denies any suicidality, homicidality or perceptual  disturbances.  Patient denies any side effects to medications.  Patient denies any other concerns today.  Visit Diagnosis:    ICD-10-CM   1. PTSD (post-traumatic stress disorder)  F43.10 busPIRone (BUSPAR) 30 MG tablet  2. Bipolar disorder, in full remission, most recent episode depressed ([place])  F31.76   3. Insomnia due to mental condition  F51.05   4. Bereavement  Z63.4   5. Alcohol use disorder, moderate, in sustained remission ([name])  F10.21   6. Cocaine use disorder, moderate, in sustained remission ([place])  F14.21     Past Psychiatric History: I have reviewed past psychiatric history from my progress note on 05/04/2019.  Past trials of Klonopin, Lexapro, Rexulti, Wellbutrin, Lamictal, lithium, BuSpar, Vraylar,Elavil  Past Medical History:  Past Medical History:  Diagnosis Date  . Bipolar disorder [facility])    psychiatrist in [place]  . Blood in stool   . Depression   . Drug abuse in remission [facility])    sober for 6 years  . Fatty liver 01/2018  . H/O alcohol abuse   . Headache    migraines  . Hypertension   . Low testosterone in male     Past Surgical History:  Procedure Laterality Date  . APPENDECTOMY     2006  . COLONOSCOPY WITH PROPOFOL N/A 12/30/2017   Procedure: COLONOSCOPY WITH PROPOFOL;  Surgeon: Lin Landsman, MD;  Location[facility] ENDOSCOPY;  Service: Gastroenterology;  Laterality: N/A;  . ESOPHAGOGASTRODUODENOSCOPY (EGD) WITH PROPOFOL N/A 12/30/2017   Procedure: ESOPHAGOGASTRODUODENOSCOPY (EGD) WITH PROPOFOL;  Surgeon: Sherri Sear  Reece Levy, MD;  Location: [place];  Service: Gastroenterology;  Laterality: N/A;    Family Psychiatric History: I have reviewed family psychiatric history from my progress note on 05/04/2019  Family History:  Family History  Problem Relation Age of Onset  . Depression Mother   . Alcohol abuse Father   . Cancer Father        prostate dx'ed 22  . COPD Father   . Hyperlipidemia Father   . Hypertension Father     Social History: I  have reviewed social history from my progress note on 05/04/2019 Social History   Socioeconomic History  . Marital status: Single    Spouse name: Not on file  . Number of children: Not on file  . Years of education: Not on file  . Highest education level: Not on file  Occupational History  . Occupation: disabled  Tobacco Use  . Smoking status: Former Smoker    Packs/day: 0.25    Years: 2.00    Pack years: 0.50    Types: Cigarettes    Quit date: 1999    Years since quitting: 23.1  . Smokeless tobacco: Former Systems developer    Types: Secondary school teacher  . Vaping Use: Former  Substance and Sexual Activity  . Alcohol use: Not Currently    Comment: former quit 03/14/2012  . Drug use: Not Currently    Comment: former quit cocaine 03/14/2012   . Sexual activity: Yes  Other Topics Concern  . 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    Social Determinants of Health   Financial Resource Strain: Not on file  Food Insecurity: Not on file  Transportation Needs: Not on file  Physical Activity: Not on file  Stress: Not on file  Social Connections: Not on file    Allergies: No Known Allergies  Metabolic Disorder Labs: Lab Results  Component Value Date   HGBA1C 5.6 02/26/2020   MPG 114.02 09/13/2019   No results found for: PROLACTIN Lab Results  Component Value Date   CHOL 175 08/08/2020   TRIG 44 08/08/2020   HDL 53 08/08/2020   CHOLHDL 3.3 08/08/2020   VLDL 11.6 02/26/2020   LDLCALC 113 (H) 08/08/2020   LDLCALC 158 (H) 02/26/2020   Lab Results  Component Value Date   TSH 1.520 08/08/2020   TSH 1.76 08/10/2019    Therapeutic Level Labs: Lab Results  Component Value Date   LITHIUM 0.4 (L) 06/23/2018   LITHIUM 0.8 10/25/2017   No results found for: VALPROATE No components found for:  CBMZ  Current Medications: Current Outpatient Medications   Medication Sig Dispense Refill  . cyclobenzaprine (FLEXERIL) 5 MG tablet Take by mouth.    Marland Kitchen buPROPion (WELLBUTRIN XL) 300 MG 24 hr tablet Take 1 tablet (300 mg total) by mouth daily. 90 tablet 0  . busPIRone (BUSPAR) 30 MG tablet Take 1 tablet (30 mg total) by mouth in the morning and at bedtime. 180 tablet 1  . clomiPHENE (CLOMID) 50 MG tablet Take by mouth.    . gabapentin (NEURONTIN) 600 MG tablet Take 1 tablet (600 mg total) by mouth 3 (three) times daily. 90 tablet 5  . lamoTRIgine (LAMICTAL) 200 MG tablet Take 1 tablet (200 mg total) by mouth daily. 90 tablet 1  . meloxicam (MOBIC) 15  MG tablet Take by mouth.    . methocarbamol (ROBAXIN) 750 MG tablet Take by mouth.    . Naproxen Sodium 220 MG CAPS Take by mouth.    . ondansetron (ZOFRAN-ODT) 4 MG disintegrating tablet Take 1 tablet (4 mg total) by mouth every 8 (eight) hours as needed. 30 tablet 11  . sildenafil (REVATIO) 20 MG tablet Take 2 tablets (40 mg total) by mouth daily in the afternoon. 60 tablet 11  . SUMAtriptan (IMITREX) 100 MG tablet Take 1 tablet (100 mg total) by mouth every 2 (two) hours as needed. Max dose 200 mg/24 hours. Limit to 2x per week 9 tablet 11  . zolpidem (AMBIEN) 10 MG tablet TAKE ONE TABLET BY MOUTH EVERY NIGHT AT BEDTIME AS NEEDED FOR SLEEP 90 tablet 0   No current facility-administered medications for this visit.     Musculoskeletal: Strength & Muscle Tone: UTA Gait & Station: UTA Patient leans: N/A  Psychiatric Specialty Exam: Review of Systems  Psychiatric/Behavioral: The patient is nervous/anxious.   All other systems reviewed and are negative.   There were no vitals taken for this visit.There is no height or weight on file to calculate BMI.  General Appearance: Casual  Eye Contact:  Fair  Speech:  Clear and Coherent  Volume:  Normal  Mood:  Anxious, grieving  Affect:  Congruent  Thought Process:  Goal Directed and Descriptions of Associations: Intact  Orientation:  Full (Time, Place,  and Person)  Thought Content: Logical   Suicidal Thoughts:  No  Homicidal Thoughts:  No  Memory:  Immediate;   Fair Recent;   Fair Remote;   Fair  Judgement:  Fair  Insight:  Fair  Psychomotor Activity:  Normal  Concentration:  Concentration: Fair and Attention Span: Fair  Recall:  [employer] of Knowledge: Fair  Language: Fair  Akathisia:  No  Handed:  Right  AIMS (if indicated): UTA  Assets:  Communication Skills Desire for Improvement Housing Social Support  ADL's:  Intact  Cognition: WNL  Sleep:  Fair   Screenings: AIMS   Flowsheet Row Admission (Discharged) from 09/09/2019 in [place] Total Score 0    AUDIT   Flowsheet Row Admission (Discharged) from 09/09/2019 in [place] Admission (Discharged) from 06/01/2019 in [place]  Alcohol Use Disorder Identification Test Final Score (AUDIT) 40 33    GAD-7   Flowsheet Row Video Visit [facility] 02/02/2020 in [facility]  Total GAD-7 Score 0    PHQ2-9   Flowsheet Row Video Visit from 11/17/2020 in [place] Counselor from 08/08/2020 in [place] Video Visit from 02/02/2020 in [facility] Office Visit from 07/09/2019 in [place] from 06/26/2019 in [place]  PHQ-2 Total Score 1 0 0 2 0  PHQ-9 Total Score - 0 - 4 -    Flowsheet Row Video Visit from 11/17/2020 in [place] Admission (Discharged) from 09/09/2019 in [place] ED from 09/08/2019 in [place] No Risk High Risk High Risk       Assessment and Plan: Johnny Villa is a 45 year old male, divorced, lives in [place], has a history of PTSD, bipolar disorder, alcohol and cocaine use disorder, migraine headache was  evaluated by telemedicine today.  Patient is biologically predisposed given his history of substance abuse problems, history of trauma.  Patient  is currently grieving the loss of his grandmother as well as does report anxiety especially in social situation.  Patient will benefit from the following plan.  Plan PTSD-unstable Increase BuSpar to 30 mg p.o. twice daily Continue Ambien 10 mg p.o. nightly as needed Patient advised to restart psychotherapy sessions however he reports he is worried about the co-pay.  Hence provided patient information for [facility].  Bipolar disorder in remission Lamictal 200 mg p.o. daily Wellbutrin XL 300 mg p.o. daily  Bereavement-discussed with patient to start counseling with [facility]. Provided supportive grief counseling.  Alcohol use disorder in remission/cocaine use disorder in remission Patient is currently in [employer].  Completed supplementary claim disability benefit from-discussed the same with patient.  Will fax it today.  Follow-up in clinic in 6 weeks or sooner if needed.  I have spent atleast 30 minutes face to face by video with patient today. More than 50 % of the time was spent for preparing to see the patient ( e.g., review of test, records ), obtaining and to review and separately obtained history , ordering medications and test ,psychoeducation and supportive psychotherapy and care coordination,as well as documenting clinical information in electronic health record. This note was generated in part or whole with voice recognition software. Voice recognition is usually quite accurate but there are transcription errors that can and very often do occur. I apologize for any typographical errors that were not detected and corrected.        Ursula Alert, MD 11/18/2020, 4:30 AM

## 2020-11-18 ENCOUNTER — Telehealth: Payer: Self-pay | Admitting: Psychiatry

## 2020-11-18 NOTE — Telephone Encounter (Signed)
Completed supplemental disability form-Jessica CMA to fax it.

## 2020-11-21 ENCOUNTER — Telehealth: Payer: Self-pay | Admitting: Internal Medicine

## 2020-11-21 DIAGNOSIS — M898X1 Other specified disorders of bone, shoulder: Secondary | ICD-10-CM | POA: Diagnosis not present

## 2020-11-21 DIAGNOSIS — S46811A Strain of other muscles, fascia and tendons at shoulder and upper arm level, right arm, initial encounter: Secondary | ICD-10-CM | POA: Diagnosis not present

## 2020-11-21 DIAGNOSIS — S29012A Strain of muscle and tendon of back wall of thorax, initial encounter: Secondary | ICD-10-CM | POA: Diagnosis not present

## 2020-11-21 NOTE — Telephone Encounter (Signed)
Left message for patient to call back and schedule Medicare Annual Wellness Visit (AWV)   This should be a virtual or telephone visit only=30 minutes.  Last AWV 06/26/19; please schedule at anytime with Denisa O'Brien-Blaney at [facility].

## 2020-11-22 ENCOUNTER — Telehealth: Payer: Self-pay

## 2020-11-22 DIAGNOSIS — F3176 Bipolar disorder, in full remission, most recent episode depressed: Secondary | ICD-10-CM

## 2020-11-22 DIAGNOSIS — F1421 Cocaine dependence, in remission: Secondary | ICD-10-CM

## 2020-11-22 DIAGNOSIS — F4310 Post-traumatic stress disorder, unspecified: Secondary | ICD-10-CM

## 2020-11-22 MED ORDER — BUPROPION HCL ER (XL) 300 MG PO TB24: 300.0000 mg | ORAL_TABLET | Freq: Every day | ORAL | 1 refills | Status: DC

## 2020-11-22 MED ORDER — LAMOTRIGINE 200 MG PO TABS
200.0000 mg | ORAL_TABLET | Freq: Every day | ORAL | 1 refills | Status: DC
Start: 2020-11-22 — End: 2021-02-09

## 2020-11-22 MED ORDER — BUSPIRONE HCL 30 MG PO TABS: 30.0000 mg | ORAL_TABLET | Freq: Two times a day (BID) | ORAL | 1 refills | Status: AC

## 2020-11-22 NOTE — Telephone Encounter (Signed)
received a fax requesting a 90 day supply of medications for lamotrigine , buspirone hcl 30mg , bupropion hcl xl 300mg  and zolpidem tartrate

## 2020-11-22 NOTE — Telephone Encounter (Signed)
I have sent medications-Lamictal, BuSpar, bupropion to [facility].  Ambien was recently picked up for 90-day supply and will not be refilled.

## 2020-12-01 DIAGNOSIS — S86891S Other injury of other muscle(s) and tendon(s) at lower leg level, right leg, sequela: Secondary | ICD-10-CM | POA: Diagnosis not present

## 2020-12-01 DIAGNOSIS — S92911A Unspecified fracture of right toe(s), initial encounter for closed fracture: Secondary | ICD-10-CM | POA: Diagnosis not present

## 2020-12-02 DIAGNOSIS — S92514A Nondisplaced fracture of proximal phalanx of right lesser toe(s), initial encounter for closed fracture: Secondary | ICD-10-CM | POA: Diagnosis not present

## 2020-12-08 ENCOUNTER — Telehealth: Payer: Self-pay

## 2020-12-08 NOTE — Telephone Encounter (Signed)
received a request for [facility] zolpidem tartrate

## 2020-12-09 NOTE — Telephone Encounter (Signed)
Patient just picked up Zolpiden on 11/15/2020 - 90 days supply. Will not sent early refill.

## 2020-12-13 DIAGNOSIS — E230 Hypopituitarism: Secondary | ICD-10-CM | POA: Diagnosis not present

## 2020-12-21 ENCOUNTER — Telehealth: Payer: Self-pay

## 2020-12-21 DIAGNOSIS — Z9189 Other specified personal risk factors, not elsewhere classified: Secondary | ICD-10-CM | POA: Insufficient documentation

## 2020-12-21 DIAGNOSIS — F3176 Bipolar disorder, in full remission, most recent episode depressed: Secondary | ICD-10-CM

## 2020-12-21 DIAGNOSIS — F1421 Cocaine dependence, in remission: Secondary | ICD-10-CM

## 2020-12-21 DIAGNOSIS — F410 Panic disorder [episodic paroxysmal anxiety] without agoraphobia: Secondary | ICD-10-CM

## 2020-12-21 MED ORDER — PROPRANOLOL HCL 10 MG PO TABS: 10.0000 mg | ORAL_TABLET | Freq: Every day | ORAL | 0 refills | Status: DC | PRN

## 2020-12-21 NOTE — Telephone Encounter (Signed)
Returned call to patient.  He reports he is having chest pain shortness of breath since the past 6 days coming and going.  He does report several psychosocial stressors including traveling to [place], interacting with family, moving to [place], renting a new place, in the process of selling his house here in [place] and so on.  Discussed with patient he will need an EKG.  He will also need to go to the nearest urgent care or talk to his primary care provider to rule out medical causes of chest pain.  However will prescribe propranolol 1 to 2 tablets of 10 mg as needed daily for severe anxiety attacks only.  Patient advised to limit use.

## 2020-12-21 NOTE — Telephone Encounter (Signed)
called pt he states that he feels like he is having anxiety or panic attacks, he just having Shortness of breath,  chest tightness, and alot of anxiety, jumpy

## 2020-12-21 NOTE — Telephone Encounter (Signed)
Please ask him to go to nearest urgent care or ED

## 2020-12-21 NOTE — Telephone Encounter (Signed)
pt called left a message that he was having some anxiety issues and to call him back.

## 2020-12-22 DIAGNOSIS — Z87891 Personal history of nicotine dependence: Secondary | ICD-10-CM | POA: Diagnosis not present

## 2020-12-22 DIAGNOSIS — R079 Chest pain, unspecified: Secondary | ICD-10-CM | POA: Diagnosis not present

## 2020-12-22 DIAGNOSIS — Z79899 Other long term (current) drug therapy: Secondary | ICD-10-CM | POA: Diagnosis not present

## 2020-12-22 DIAGNOSIS — R0789 Other chest pain: Secondary | ICD-10-CM | POA: Diagnosis not present

## 2020-12-22 DIAGNOSIS — S92911A Unspecified fracture of right toe(s), initial encounter for closed fracture: Secondary | ICD-10-CM | POA: Diagnosis not present

## 2020-12-28 ENCOUNTER — Telehealth: Payer: Self-pay | Admitting: *Deleted

## 2020-12-28 ENCOUNTER — Encounter: Payer: Self-pay | Admitting: Student in an Organized Health Care Education/Training Program

## 2020-12-28 NOTE — Telephone Encounter (Signed)
Voicemail left with patient prior to VV to please call us back to go over information in chart.

## 2020-12-29 ENCOUNTER — Encounter: Payer: Self-pay | Admitting: Psychiatry

## 2020-12-29 ENCOUNTER — Ambulatory Visit
Payer: Medicare Other | Attending: Student in an Organized Health Care Education/Training Program | Admitting: Student in an Organized Health Care Education/Training Program

## 2020-12-29 ENCOUNTER — Other Ambulatory Visit: Payer: Self-pay

## 2020-12-29 ENCOUNTER — Telehealth (INDEPENDENT_AMBULATORY_CARE_PROVIDER_SITE_OTHER): Payer: Medicare Other | Admitting: Psychiatry

## 2020-12-29 DIAGNOSIS — Z634 Disappearance and death of family member: Secondary | ICD-10-CM

## 2020-12-29 DIAGNOSIS — F1421 Cocaine dependence, in remission: Secondary | ICD-10-CM

## 2020-12-29 DIAGNOSIS — F4310 Post-traumatic stress disorder, unspecified: Secondary | ICD-10-CM | POA: Diagnosis not present

## 2020-12-29 DIAGNOSIS — G894 Chronic pain syndrome: Secondary | ICD-10-CM

## 2020-12-29 DIAGNOSIS — M5416 Radiculopathy, lumbar region: Secondary | ICD-10-CM

## 2020-12-29 DIAGNOSIS — F1021 Alcohol dependence, in remission: Secondary | ICD-10-CM

## 2020-12-29 DIAGNOSIS — F5101 Primary insomnia: Secondary | ICD-10-CM | POA: Diagnosis not present

## 2020-12-29 DIAGNOSIS — S92911A Unspecified fracture of right toe(s), initial encounter for closed fracture: Secondary | ICD-10-CM | POA: Diagnosis not present

## 2020-12-29 DIAGNOSIS — F3176 Bipolar disorder, in full remission, most recent episode depressed: Secondary | ICD-10-CM | POA: Diagnosis not present

## 2020-12-29 MED ORDER — GABAPENTIN 600 MG PO TABS: 600.0000 mg | ORAL_TABLET | Freq: Three times a day (TID) | ORAL | 5 refills | Status: DC

## 2020-12-29 NOTE — Progress Notes (Signed)
Virtual Visit via Video Note  I connected with Johnny Villa on 12/29/20 at  3:00 PM EDT by a video enabled telemedicine application and verified that I am speaking with the correct person using two identifiers.  Location Provider Location : [facility] Patient Location : [place]  Participants: Patient , Provider   I discussed the limitations of evaluation and management by telemedicine and the availability of in person appointments. The patient expressed understanding and agreed to proceed.  I discussed the assessment and treatment plan with the patient. The patient was provided an opportunity to ask questions and all were answered. The patient agreed with the plan and demonstrated an understanding of the instructions.   The patient was advised to call back or seek an in-person evaluation if the symptoms worsen or if the condition fails to improve as anticipated.   [facility] MD OP Progress Note  12/29/2020 7:36 PM Demetris Capell  MRN:  [id number]  Chief Complaint:  Chief Complaint    Follow-up; Anxiety     HPI: Johnny Villa is a 45 year old male, currently lives in [place], disabled, has a history of PTSD, bipolar disorder in remission, insomnia, alcohol use disorder, cocaine use disorder in remission, was evaluated by telemedicine today.  Patient today reports he is currently living with his girlfriend and her son who is 48 year old in the home which they rented in [place].  Patient reports his house here in [place] has been placed on sale.  He is hoping to sell it soon.  Patient reports there has been a lot of situational stressors with the moving.  That may have triggered his anxiety symptoms.  Patient reports his chest pain has resolved and he is not as anxious as he was a few weeks ago.  He did go to [facility] emergency department at [facility] where he had cardiology clearance.  Patient reports he found out that his chest pain was not related to any underlying cardiology  problems or other medical causes.  However they did ask him to take a PPI for his GI concerns especially at night.  Patient reports he however has been going through this phase of not being able to sleep at night.  Patient however reports he wants to stay on the Ambien since he knows he is going to come out of this phase.  He will go through cycles like this however it does not last too long.  Patient denies any suicidality, homicidality or perceptual disturbances.  Patient continues to go for AA meetings 2-3 times a day.  He denies any substance abuse problems at this time.  Patient does report propranolol likely making him sluggish during the day.  Patient reports his pain problems are currently under control on the current medication regimen with Dr. Holley Raring.  Patient appeared to be drowsy, slurring with his speech on and off.  Patient however reports since he did not sleep last night he was sleeping when writer called him and that could be causing it.  His slurring with speech did not last too long however he appeared to be very drowsy, slowed and was observed as yawning throughout the session.  Visit Diagnosis:    ICD-10-CM   1. PTSD (post-traumatic stress disorder)  F43.10   2. Bipolar disorder, in full remission, most recent episode depressed ([place])  F31.76   3. Primary insomnia  F51.01   4. Bereavement  Z63.4   5. Alcohol use disorder, moderate, in sustained remission ([name])  F10.21   6. Cocaine use  disorder, moderate, in sustained remission ([place])  F14.21     Past Psychiatric History: I have reviewed past psychiatric history from my progress note on 05/04/2019.  Past trials of Klonopin, Lexapro, Rexulti, Wellbutrin, Lamictal, lithium, BuSpar, Vraylar, Elavil  Past Medical History:  Past Medical History:  Diagnosis Date  . Bipolar disorder [facility])    psychiatrist in [place]  . Blood in stool   . Depression   . Drug abuse in remission [facility])    sober for 6 years  . Fatty liver  01/2018  . H/O alcohol abuse   . Headache    migraines  . Hypertension   . Low testosterone in male     Past Surgical History:  Procedure Laterality Date  . APPENDECTOMY     2006  . COLONOSCOPY WITH PROPOFOL N/A 12/30/2017   Procedure: COLONOSCOPY WITH PROPOFOL;  Surgeon: Lin Landsman, MD;  Location[facility] ENDOSCOPY;  Service: Gastroenterology;  Laterality: N/A;  . ESOPHAGOGASTRODUODENOSCOPY (EGD) WITH PROPOFOL N/A 12/30/2017   Procedure: ESOPHAGOGASTRODUODENOSCOPY (EGD) WITH PROPOFOL;  Surgeon: Lin Landsman, MD;  Location: [facility] ENDOSCOPY;  Service: Gastroenterology;  Laterality: N/A;    Family Psychiatric History: I have reviewed family psychiatric history from my progress note on 05/04/2019  Family History:  Family History  Problem Relation Age of Onset  . Depression Mother   . Alcohol abuse Father   . Cancer Father        prostate dx'ed 57  . COPD Father   . Hyperlipidemia Father   . Hypertension Father     Social History: I have reviewed social history from my progress note on 05/04/2019 Social History   Socioeconomic History  . Marital status: Single    Spouse name: Not on file  . Number of children: Not on file  . Years of education: Not on file  . Highest education level: Not on file  Occupational History  . Occupation: disabled  Tobacco Use  . Smoking status: Former Smoker    Packs/day: 0.25    Years: 2.00    Pack years: 0.50    Types: Cigarettes    Quit date: 1999    Years since quitting: 23.2  . Smokeless tobacco: Former Systems developer    Types: Secondary school teacher  . Vaping Use: Former  Substance and Sexual Activity  . Alcohol use: Not Currently    Comment: former quit 03/14/2012  . Drug use: Not Currently    Comment: former quit cocaine 03/14/2012   . Sexual activity: Yes  Other Topics Concern  . 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    Social Determinants of Health   Financial Resource Strain: Not on file  Food Insecurity: Not on file  Transportation Needs: Not on file  Physical Activity: Not on file  Stress: Not on file  Social Connections: Not on file    Allergies: No Known Allergies  Metabolic Disorder Labs: Lab Results  Component Value Date   HGBA1C 5.6 02/26/2020   MPG 114.02 09/13/2019   No results found for: PROLACTIN Lab Results  Component Value Date   CHOL 175 08/08/2020   TRIG 44 08/08/2020   HDL 53 08/08/2020   CHOLHDL 3.3 08/08/2020   VLDL 11.6 02/26/2020   LDLCALC 113 (H) 08/08/2020   LDLCALC 158 (H)  02/26/2020   Lab Results  Component Value Date   TSH 1.520 08/08/2020   TSH 1.76 08/10/2019    Therapeutic Level Labs: Lab Results  Component Value Date   LITHIUM 0.4 (L) 06/23/2018   LITHIUM 0.8 10/25/2017   No results found for: VALPROATE No components found for:  CBMZ  Current Medications: Current Outpatient Medications  Medication Sig Dispense Refill  . buPROPion (WELLBUTRIN XL) 300 MG 24 hr tablet Take 1 tablet (300 mg total) by mouth daily. 90 tablet 1  . busPIRone (BUSPAR) 30 MG tablet Take 1 tablet (30 mg total) by mouth in the morning and at bedtime. 180 tablet 1  . clomiPHENE (CLOMID) 50 MG tablet Take by mouth.    . gabapentin (NEURONTIN) 600 MG tablet Take 1 tablet (600 mg total) by mouth 3 (three) times daily. 90 tablet 5  . lamoTRIgine (LAMICTAL) 200 MG tablet Take 1 tablet (200 mg total) by mouth daily. 90 tablet 1  . Naproxen Sodium 220 MG CAPS Take by mouth.    . ondansetron (ZOFRAN-ODT) 4 MG disintegrating tablet Take 1 tablet (4 mg total) by mouth every 8 (eight) hours as needed. 30 tablet 11  . propranolol (INDERAL) 10 MG tablet Take 1-2 tablets (10-20 mg total) by mouth daily as needed. For severe panic attacks only 20 tablet 0  . sildenafil (REVATIO) 20 MG tablet Take 2 tablets (40 mg total) by mouth daily in the afternoon. 60 tablet 11  .  SUMAtriptan (IMITREX) 100 MG tablet Take 1 tablet (100 mg total) by mouth every 2 (two) hours as needed. Max dose 200 mg/24 hours. Limit to 2x per week 9 tablet 11  . zolpidem (AMBIEN) 10 MG tablet TAKE ONE TABLET BY MOUTH EVERY NIGHT AT BEDTIME AS NEEDED FOR SLEEP 90 tablet 0   No current facility-administered medications for this visit.     Musculoskeletal: Strength & Muscle Tone: UTA Gait & Station: UTA Patient leans: N/A  Psychiatric Specialty Exam: Review of Systems  Psychiatric/Behavioral: Positive for sleep disturbance. The patient is nervous/anxious.   All other systems reviewed and are negative.   There were no vitals taken for this visit.There is no height or weight on file to calculate BMI.  General Appearance: Casual  Eye Contact:  Fair  Speech:  Slurred at times , but overall OK, reports he just woke up from a nap , observed as yawning  Volume:  Normal  Mood:  Anxious  Affect:  Congruent  Thought Process:  Goal Directed and Descriptions of Associations: Intact  Orientation:  Full (Time, Place, and Person)  Thought Content: Logical   Suicidal Thoughts:  No  Homicidal Thoughts:  No  Memory:  Immediate;   Fair Recent;   Fair Remote;   Fair  Judgement:  Fair  Insight:  Fair  Psychomotor Activity:  Normal  Concentration:  Concentration: Fair and Attention Span: Fair  Recall:  [employer] of Knowledge: Fair  Language: Fair  Akathisia:  No  Handed:  Right  AIMS (if indicated): UTA  Assets:  Communication Skills Desire for Improvement Housing Intimacy  ADL's:  Intact  Cognition: WNL  Sleep:  Poor   Screenings: AIMS   Flowsheet Row Admission (Discharged) from 09/09/2019 in [facility] Total Score 0    AUDIT   Flowsheet Row Admission (Discharged) from 09/09/2019 in [facility] Admission (Discharged) from 06/01/2019 in [facility]  Alcohol Use Disorder Identification Test Final Score  (AUDIT) 40 33  GAD-7   Flowsheet Row Video Visit from 02/02/2020 in [facility]  Total GAD-7 Score 0    PHQ2-9   Flowsheet Row Video Visit from 11/17/2020 in [facility] Counselor from 08/08/2020 in [facility] [facility] Video Visit from 02/02/2020 in [facility] Office Visit from 07/09/2019 in [facility] from 06/26/2019 in [facility]  PHQ-2 Total Score 1 0 0 2 0  PHQ-9 Total Score -- 0 -- 4 --    Flowsheet Row Video Visit from 11/17/2020 in [facility] Admission (Discharged) from 09/09/2019 in [facility] ED from 09/08/2019 in [facility] No Risk High Risk High Risk       Assessment and Plan: Johnny Villa is a 45 year old male, divorced, lives in [place], has a history of PTSD, bipolar disorder, alcohol and cocaine use disorder, migraine headaches was evaluated by telemedicine today.  Patient is biologically predisposed given his history of substance abuse problems, history of trauma.  Patient is currently going through multiple psychosocial stressors including recent relocation to [place].  Patient is currently struggling with sleep problems.  Plan as noted below.  Plan PTSD-improving BuSpar 30 mg p.o. twice daily Ambien 10 mg p.o. nightly as needed   Bipolar disorder in remission Lamictal 200 mg p.o. daily Wellbutrin XL 300 mg p.o. daily  Bereavement-discussed with patient to start counseling in the past.  Pending  Primary insomnia-unstable Patient wants to stay on the Ambien and is not interested in medication changes. Patient to work on sleep hygiene techniques.  Alcohol use disorder in remission/cocaine use disorder in remission Continue AA meetings  Patient had recent medical clearance, cardiology work-up at  [facility]-I have reviewed these records dated 12/22/2020-including EKG-within normal limits.  Follow-up in clinic in 3 weeks or sooner if needed.  This note was generated in part or whole with voice recognition software. Voice recognition is usually quite accurate but there are transcription errors that can and very often do occur. I apologize for any typographical errors that were not detected and corrected.       Ursula Alert, MD 12/29/2020, 7:36 PM

## 2020-12-29 NOTE — Progress Notes (Signed)
Patient: Johnny Villa  Service Category: E/M  Provider: Gillis Santa, MD  DOB: Oct 24, 1975  DOS: 12/29/2020  Location: Office  MRN: [id number]  Setting: Ambulatory outpatient  Referring Provider: McLean-Scocuzza, Olivia Mackie *  Type: Established Patient  Specialty: Interventional Pain Management  PCP: McLean-Scocuzza, Nino Glow, MD  Location: Home  Delivery: TeleHealth     Virtual Encounter - Pain Management PROVIDER NOTE: Information contained herein reflects review and annotations entered in association with encounter. Interpretation of such information and data should be left to medically-trained personnel. Information provided to patient can be located elsewhere in the medical record under "Patient Instructions". Document created using STT-dictation technology, any transcriptional errors that may result from process are unintentional.    Contact & Pharmacy Preferred: [phone] Home: [phone] (home) Mobile: [phone] (mobile) E-mail: [email or web address]  [place], [place] [address] [place] [address] Phone: [phone] Fax: [phone]  [facility] [address], [place] [address] Phone: [phone] Fax: [place], [place] [address] [place] [address] Phone: [phone] Fax: [phone]   Pre-screening  Mr. Gaddie offered "in-person" vs "virtual" encounter. He indicated preferring virtual for this encounter.   Reason COVID-19*  Social distancing based on CDC and AMA recommendations.   I contacted [name] on 12/29/2020 via video conference.      I clearly identified myself as Gillis Santa, MD. I verified that I was speaking with the correct person using two identifiers (Name: Sharrod Achille, and date of birth: [birth year]).  Consent I sought verbal advanced consent from [name] for virtual visit interactions. I informed Mr. Benning of possible security and privacy concerns, risks, and limitations associated with providing "not-in-person" medical evaluation and management services. I also informed Mr. Davids of the availability of "in-person" appointments. Finally, I informed him that there would be a charge for the virtual visit and that he could be  personally, fully or partially, financially responsible for it. Mr. Chiarelli expressed understanding and agreed to proceed.   Historic Elements   Mr. Harbert Fitterer is a 45 y.o. year old, male patient evaluated today after our last contact on 08/02/2020. Mr. Grammatico  has a past medical history of Bipolar disorder ([place]), Blood in stool, Depression, Drug abuse in remission [facility]), Fatty liver (01/2018), H/O alcohol abuse, Headache, Hypertension, and Low testosterone in male. He also  has a past surgical history that includes Appendectomy; Colonoscopy with propofol (N/A, 12/30/2017); and Esophagogastroduodenoscopy (egd) with propofol (N/A, 12/30/2017). Mr. Dotzler has a current medication list which includes the following prescription(s): bupropion, buspirone, clomiphene, lamotrigine, naproxen sodium, ondansetron, propranolol, sildenafil, sumatriptan, zolpidem, and gabapentin. He  reports that he quit smoking about 23 years ago. His smoking use included cigarettes. He has a 0.50 pack-year smoking history. He has quit using smokeless tobacco.  His smokeless tobacco use included chew. He reports previous alcohol use. He reports previous drug use. Mr. Toothman has No Known Allergies.   HPI  Today, he is being contacted for medication management.  Patient is requesting a refill of his gabapentin, 600 mg 3 times a day that he takes for nerve pain.  UDS:  Summary  Date Value Ref Range Status  04/09/2018 FINAL  Final    Comment:    ==================================================================== [facility] COMP DRUG  ANALYSIS,UR ==================================================================== Test  Result       Flag       Units Drug Present and Declared for Prescription Verification   7-aminoclonazepam              114          EXPECTED   ng/mg creat    7-aminoclonazepam is an expected metabolite of clonazepam. Source    of clonazepam is a scheduled prescription medication.   Oxycodone                      149          EXPECTED   ng/mg creat   Oxymorphone                    62           EXPECTED   ng/mg creat   Noroxycodone                   695          EXPECTED   ng/mg creat   Noroxymorphone                 84           EXPECTED   ng/mg creat    Sources of oxycodone are scheduled prescription medications.    Oxymorphone, noroxycodone, and noroxymorphone are expected    metabolites of oxycodone. Oxymorphone is also available as a    scheduled prescription medication.   Lamotrigine                    PRESENT      EXPECTED   Duloxetine                     PRESENT      EXPECTED   Trazodone                      PRESENT      EXPECTED   1,3 chlorophenyl piperazine    PRESENT      EXPECTED    1,3-chlorophenyl piperazine is an expected metabolite of    trazodone. Drug Present not Declared for Prescription Verification   Amphetamine                    143          UNEXPECTED ng/mg creat    Amphetamine is available as a schedule II prescription drug. Drug Absent but Declared for Prescription Verification   Bupropion                      Not Detected UNEXPECTED   Acetaminophen                  Not Detected UNEXPECTED    Acetaminophen, as indicated in the declared medication list, is    not always detected even when used as directed.   Salicylate                     Not Detected UNEXPECTED    Aspirin, as indicated in the declared medication list, is not    always detected even when used as directed. ==================================================================== Test                       Result    Flag   Units      Ref Range  Creatinine              152              mg/dL      >=20 ==================================================================== Declared Medications:  The flagging and interpretation on this report are based on the  following declared medications.  Unexpected results may arise from  inaccuracies in the declared medications.  **Note: The testing scope of this panel includes these medications:  Bupropion  Clonazepam  Duloxetine  Lamotrigine  Oxycodone  Oxycodone (Oxy-IR)  Trazodone  **Note: The testing scope of this panel does not include small to  moderate amounts of these reported medications:  Acetaminophen  Acetaminophen (Excedrin)  Aspirin (Excedrin)  **Note: The testing scope of this panel does not include following  reported medications:  Albuterol  Buspirone  Caffeine (Excedrin)  Clomiphene  Fluticasone  Lithium  Melatonin  Multivitamin (MVI)  Saline  Sildenafil  Sumatriptan  Vitamin A  Vitamin D ==================================================================== For clinical consultation, please call [phone]. ====================================================================     Laboratory Chemistry Profile   Renal Lab Results  Component Value Date   BUN 21 08/08/2020   CREATININE 1.12 08/08/2020   BCR 19 08/08/2020   GFR 77.46 02/26/2020   GFRAA 93 08/08/2020   GFRNONAA 81 08/08/2020     Hepatic Lab Results  Component Value Date   AST 23 09/02/2020   ALT 33 09/02/2020   ALBUMIN 4.3 09/02/2020   ALKPHOS 58 09/02/2020     Electrolytes Lab Results  Component Value Date   NA 140 08/08/2020   K 4.9 08/08/2020   CL 103 08/08/2020   CALCIUM 9.6 08/08/2020   MG 2.3 09/13/2019   PHOS 3.4 09/13/2019     Bone Lab Results  Component Value Date   VD25OH 49.6 08/08/2020   TESTOFREE 17.3 08/08/2020   TESTOSTERONE 650 08/08/2020     Inflammation (CRP: Acute Phase) (ESR: Chronic  Phase) Lab Results  Component Value Date   CRP 1.0 10/25/2017   ESRSEDRATE 5 10/25/2017       Note: Above Lab results reviewed.  Imaging  DG FLUORO GUIDED NEEDLE PLC ASPIRATION/INJECTION LOC CLINICAL DATA:  Right shoulder pain.  EXAM: RIGHT SHOULDER ARTHROGRAM UNDER FLUOROSCOPY  COMPARISON:  None.  FLUOROSCOPY TIME:  Fluoroscopy Time:  0.1 minute  Radiation Exposure Index (if provided by the fluoroscopic device): 0.1 mGy  Number of Acquired Spot Images: 0  PROCEDURE: The risks and benefits of the procedure were discussed with the patient, and written informed consent was obtained. The patient stated no history of allergy to contrast media. A formal timeout procedure was performed with the patient according to departmental protocol.  The patient was placed supine on the fluoroscopy table and the right glenohumeral joint was identified under fluoroscopy. The skin overlying the right glenohumeral joint was subsequently cleaned with Chloraprep and a sterile drape was placed over the area of interest. 5 ml 1% Lidocaine was used to anesthetize the skin around the needle insertion site.  A 22 gauge spinal needle was inserted into the right glenohumeral joint under fluoroscopy. Position was confirmed with injection of less than 36m of Omnipaque 180 under fluoroscopy.  12 ml of gadolinium mixture (0.05 mL of Gadavist mixed with 10 mL sterile saline and 10 mL Omnipaque 180) was injected into the right glenohumeral joint.  The needle was removed and hemostasis was achieved. The patient was subsequently transferred to MRI for imaging.  IMPRESSION: Technically successful right shoulder arthrogram under fluoroscopy.  Electronically Signed  By: Kathreen Devoid   On: 09/14/2020 14:28 MR SHOULDER RIGHT W CONTRAST CLINICAL DATA:  Right shoulder pain with limited range of motion for 4 months  EXAM: MR ARTHROGRAM OF THE RIGHT SHOULDER  TECHNIQUE: Multiplanar, multisequence  MR imaging of the right shoulder was performed following the administration of intra-articular contrast.  CONTRAST:  See Injection Documentation.  COMPARISON:  X-ray 11/26/2017  FINDINGS: Rotator cuff: Supraspinatus, infraspinatus, subscapularis, and teres minor tendons intact. No rotator cuff tear.  Muscles: Preserved bulk and signal intensity of the rotator cuff musculature without edema, atrophy, or fatty infiltration.  Biceps long head: Mild intra-articular biceps tendinosis.  Acromioclavicular Joint: Minimal arthropathy of the AC joint. No fluid or contrast present within the subacromial-subdeltoid bursa.  Glenohumeral Joint: Glenohumeral joint is well distended with injected contrast. No intra-articular loose body. No cartilage defect.  Labrum: Complex tearing of the superior labrum extending posteriorly from the level of the biceps anchor with involvement of at least the 10:00 to 12:00 positions. Abnormal cleft at the posterior chondrolabral junction extending to the posteroinferior glenoid (series 5, images 12-14). Prominent sublabral foramen.  Bones: No acute fracture. No dislocation. No bone marrow edema. No suspicious bone lesion.  IMPRESSION: 1. Complex SLAP tear. Abnormal cleft at the posterior chondrolabral junction extending to the posteroinferior glenoid. 2. Mild intra-articular biceps tendinosis. 3. Intact rotator cuff without tear.  Electronically Signed   By: Davina Poke D.O.   On: 09/14/2020 13:35  Assessment  Diagnoses of Lumbar radiculopathy, Lumbar radicular pain, and Chronic pain syndrome were pertinent to this visit.  Plan of Care   Mr. Thadd Apuzzo has a current medication list which includes the following long-term medication(s): bupropion, lamotrigine, propranolol, sumatriptan, zolpidem, and gabapentin.  Pharmacotherapy (Medications Ordered): Meds ordered this encounter  Medications  . gabapentin (NEURONTIN) 600 MG tablet     Sig: Take 1 tablet (600 mg total) by mouth 3 (three) times daily.    Dispense:  90 tablet    Refill:  5   Follow-up plan:   Return if symptoms worsen or fail to improve.     Status post right C4, C5, C6, C7 RFA on 05/19/2018 for cervical spondylosis.  Status post right L4-L5 ESI #1 on 01/11/2020, repeat as needed      Recent Visits No visits were found meeting these conditions. Showing recent visits within past 90 days and meeting all other requirements Today's Visits Date Type Provider Dept  12/29/20 Telemedicine Gillis Santa, MD [facility]  Showing today's visits and meeting all other requirements Future Appointments No visits were found meeting these conditions. Showing future appointments within next 90 days and meeting all other requirements  I discussed the assessment and treatment plan with the patient. The patient was provided an opportunity to ask questions and all were answered. The patient agreed with the plan and demonstrated an understanding of the instructions.  Patient advised to call back or seek an in-person evaluation if the symptoms or condition worsens.  Duration of encounter: 15 minutes.  Note by: Gillis Santa, MD Date: 12/29/2020; Time: 2:27 PM

## 2021-01-09 ENCOUNTER — Other Ambulatory Visit: Payer: Self-pay | Admitting: Internal Medicine

## 2021-01-09 DIAGNOSIS — G43909 Migraine, unspecified, not intractable, without status migrainosus: Secondary | ICD-10-CM

## 2021-01-09 MED ORDER — ONDANSETRON 4 MG PO TBDP: 4.0000 mg | ORAL_TABLET | Freq: Three times a day (TID) | ORAL | 0 refills | Status: DC | PRN

## 2021-01-18 DIAGNOSIS — J690 Pneumonitis due to inhalation of food and vomit: Secondary | ICD-10-CM | POA: Diagnosis not present

## 2021-01-18 DIAGNOSIS — N179 Acute kidney failure, unspecified: Secondary | ICD-10-CM | POA: Diagnosis not present

## 2021-01-18 DIAGNOSIS — D509 Iron deficiency anemia, unspecified: Secondary | ICD-10-CM | POA: Diagnosis not present

## 2021-01-18 DIAGNOSIS — J810 Acute pulmonary edema: Secondary | ICD-10-CM | POA: Diagnosis not present

## 2021-01-18 DIAGNOSIS — Z4682 Encounter for fitting and adjustment of non-vascular catheter: Secondary | ICD-10-CM | POA: Diagnosis not present

## 2021-01-18 DIAGNOSIS — R404 Transient alteration of awareness: Secondary | ICD-10-CM | POA: Diagnosis not present

## 2021-01-18 DIAGNOSIS — R579 Shock, unspecified: Secondary | ICD-10-CM | POA: Diagnosis not present

## 2021-01-18 DIAGNOSIS — R4182 Altered mental status, unspecified: Secondary | ICD-10-CM | POA: Diagnosis not present

## 2021-01-18 DIAGNOSIS — T50904A Poisoning by unspecified drugs, medicaments and biological substances, undetermined, initial encounter: Secondary | ICD-10-CM | POA: Diagnosis not present

## 2021-01-18 DIAGNOSIS — I10 Essential (primary) hypertension: Secondary | ICD-10-CM | POA: Diagnosis not present

## 2021-01-18 DIAGNOSIS — F4310 Post-traumatic stress disorder, unspecified: Secondary | ICD-10-CM | POA: Diagnosis not present

## 2021-01-18 DIAGNOSIS — F319 Bipolar disorder, unspecified: Secondary | ICD-10-CM | POA: Diagnosis not present

## 2021-01-18 DIAGNOSIS — J9601 Acute respiratory failure with hypoxia: Secondary | ICD-10-CM | POA: Diagnosis not present

## 2021-01-18 DIAGNOSIS — D696 Thrombocytopenia, unspecified: Secondary | ICD-10-CM | POA: Diagnosis not present

## 2021-01-18 DIAGNOSIS — Z452 Encounter for adjustment and management of vascular access device: Secondary | ICD-10-CM | POA: Diagnosis not present

## 2021-01-18 DIAGNOSIS — C50929 Malignant neoplasm of unspecified site of unspecified male breast: Secondary | ICD-10-CM | POA: Diagnosis not present

## 2021-01-18 DIAGNOSIS — T1491XA Suicide attempt, initial encounter: Secondary | ICD-10-CM | POA: Diagnosis not present

## 2021-01-18 DIAGNOSIS — R918 Other nonspecific abnormal finding of lung field: Secondary | ICD-10-CM | POA: Diagnosis not present

## 2021-01-18 DIAGNOSIS — F10239 Alcohol dependence with withdrawal, unspecified: Secondary | ICD-10-CM | POA: Diagnosis not present

## 2021-01-18 DIAGNOSIS — E162 Hypoglycemia, unspecified: Secondary | ICD-10-CM | POA: Diagnosis not present

## 2021-01-18 DIAGNOSIS — F05 Delirium due to known physiological condition: Secondary | ICD-10-CM | POA: Diagnosis not present

## 2021-01-18 DIAGNOSIS — T447X2A Poisoning by beta-adrenoreceptor antagonists, intentional self-harm, initial encounter: Secondary | ICD-10-CM | POA: Diagnosis not present

## 2021-01-18 DIAGNOSIS — R402431 Glasgow coma scale score 3-8, in the field [EMT or ambulance]: Secondary | ICD-10-CM | POA: Diagnosis not present

## 2021-01-18 DIAGNOSIS — I9589 Other hypotension: Secondary | ICD-10-CM | POA: Diagnosis not present

## 2021-01-18 DIAGNOSIS — D72825 Bandemia: Secondary | ICD-10-CM | POA: Diagnosis not present

## 2021-01-18 DIAGNOSIS — T447X4A Poisoning by beta-adrenoreceptor antagonists, undetermined, initial encounter: Secondary | ICD-10-CM | POA: Diagnosis not present

## 2021-01-18 DIAGNOSIS — T50994A Poisoning by other drugs, medicaments and biological substances, undetermined, initial encounter: Secondary | ICD-10-CM | POA: Diagnosis not present

## 2021-01-18 DIAGNOSIS — G43009 Migraine without aura, not intractable, without status migrainosus: Secondary | ICD-10-CM | POA: Diagnosis not present

## 2021-01-18 DIAGNOSIS — I517 Cardiomegaly: Secondary | ICD-10-CM | POA: Diagnosis not present

## 2021-01-18 DIAGNOSIS — R1312 Dysphagia, oropharyngeal phase: Secondary | ICD-10-CM | POA: Diagnosis not present

## 2021-01-18 DIAGNOSIS — F1020 Alcohol dependence, uncomplicated: Secondary | ICD-10-CM | POA: Diagnosis not present

## 2021-01-18 DIAGNOSIS — R0902 Hypoxemia: Secondary | ICD-10-CM | POA: Diagnosis not present

## 2021-01-18 DIAGNOSIS — I440 Atrioventricular block, first degree: Secondary | ICD-10-CM | POA: Diagnosis not present

## 2021-01-18 DIAGNOSIS — F10229 Alcohol dependence with intoxication, unspecified: Secondary | ICD-10-CM | POA: Diagnosis not present

## 2021-01-23 ENCOUNTER — Telehealth (INDEPENDENT_AMBULATORY_CARE_PROVIDER_SITE_OTHER): Payer: Medicare Other | Admitting: Psychiatry

## 2021-01-23 ENCOUNTER — Other Ambulatory Visit: Payer: Self-pay

## 2021-01-23 DIAGNOSIS — Z91199 Patient's noncompliance with other medical treatment and regimen due to unspecified reason: Secondary | ICD-10-CM | POA: Insufficient documentation

## 2021-01-23 DIAGNOSIS — Z5329 Procedure and treatment not carried out because of patient's decision for other reasons: Secondary | ICD-10-CM

## 2021-01-23 NOTE — Progress Notes (Signed)
No response to text or video invite

## 2021-01-26 DIAGNOSIS — D72829 Elevated white blood cell count, unspecified: Secondary | ICD-10-CM | POA: Insufficient documentation

## 2021-01-26 DIAGNOSIS — T447X2A Poisoning by beta-adrenoreceptor antagonists, intentional self-harm, initial encounter: Secondary | ICD-10-CM | POA: Insufficient documentation

## 2021-01-26 DIAGNOSIS — R1312 Dysphagia, oropharyngeal phase: Secondary | ICD-10-CM | POA: Insufficient documentation

## 2021-01-26 DIAGNOSIS — J690 Pneumonitis due to inhalation of food and vomit: Secondary | ICD-10-CM | POA: Insufficient documentation

## 2021-01-26 DIAGNOSIS — D509 Iron deficiency anemia, unspecified: Secondary | ICD-10-CM | POA: Insufficient documentation

## 2021-01-26 DIAGNOSIS — I10 Essential (primary) hypertension: Secondary | ICD-10-CM | POA: Insufficient documentation

## 2021-01-26 DIAGNOSIS — J9601 Acute respiratory failure with hypoxia: Secondary | ICD-10-CM | POA: Insufficient documentation

## 2021-02-02 ENCOUNTER — Ambulatory Visit: Payer: Medicare PPO | Admitting: Internal Medicine

## 2021-02-02 ENCOUNTER — Telehealth: Payer: Self-pay

## 2021-02-02 NOTE — Telephone Encounter (Signed)
Transition Care Management Unsuccessful Follow-up Telephone Call  Date of discharge and from where:  02/01/21 from [facility]  Attempts:  1st Attempt  Reason for unsuccessful TCM follow-up call:  Unable to reach patient  HFU scheduled 02/08/21. Will follow.

## 2021-02-03 ENCOUNTER — Telehealth: Payer: Self-pay

## 2021-02-03 DIAGNOSIS — S43431A Superior glenoid labrum lesion of right shoulder, initial encounter: Secondary | ICD-10-CM | POA: Diagnosis not present

## 2021-02-03 DIAGNOSIS — G894 Chronic pain syndrome: Secondary | ICD-10-CM

## 2021-02-03 DIAGNOSIS — M5416 Radiculopathy, lumbar region: Secondary | ICD-10-CM

## 2021-02-03 DIAGNOSIS — M7521 Bicipital tendinitis, right shoulder: Secondary | ICD-10-CM | POA: Diagnosis not present

## 2021-02-03 NOTE — Telephone Encounter (Signed)
Transition Care Management Unsuccessful Follow-up Telephone Call  Date of discharge and from where:  02/01/21 from [facility]  Attempts:  2nd Attempt  Reason for unsuccessful TCM follow-up call:  Unable to reach patient

## 2021-02-03 NOTE — Telephone Encounter (Signed)
Pt needs a refill on his Gabepentin

## 2021-02-06 NOTE — Telephone Encounter (Signed)
Transition Care Management Unsuccessful Follow-up Telephone Call  Date of discharge and from where:  02/01/21 from [facility]  Attempts:  3rd Attempt  Reason for unsuccessful TCM follow-up call:  Left voice message

## 2021-02-07 MED ORDER — GABAPENTIN 600 MG PO TABS: 600.0000 mg | ORAL_TABLET | Freq: Three times a day (TID) | ORAL | 5 refills | Status: AC

## 2021-02-07 NOTE — Telephone Encounter (Signed)
Patient notified

## 2021-02-08 ENCOUNTER — Ambulatory Visit: Payer: Medicare Other | Admitting: Internal Medicine

## 2021-02-09 ENCOUNTER — Ambulatory Visit (INDEPENDENT_AMBULATORY_CARE_PROVIDER_SITE_OTHER): Payer: Medicare Other | Admitting: Psychiatry

## 2021-02-09 ENCOUNTER — Ambulatory Visit (INDEPENDENT_AMBULATORY_CARE_PROVIDER_SITE_OTHER): Payer: Medicare Other | Admitting: Internal Medicine

## 2021-02-09 ENCOUNTER — Encounter: Payer: Self-pay | Admitting: Internal Medicine

## 2021-02-09 ENCOUNTER — Encounter: Payer: Self-pay | Admitting: Psychiatry

## 2021-02-09 ENCOUNTER — Other Ambulatory Visit: Payer: Self-pay

## 2021-02-09 ENCOUNTER — Other Ambulatory Visit: Payer: Self-pay | Admitting: Psychiatry

## 2021-02-09 VITALS — BP 136/80 | HR 83 | Temp 97.7°F | Ht 71.0 in | Wt 219.4 lb

## 2021-02-09 VITALS — BP 151/84 | HR 94 | Temp 98.5°F | Wt 218.6 lb

## 2021-02-09 DIAGNOSIS — R319 Hematuria, unspecified: Secondary | ICD-10-CM

## 2021-02-09 DIAGNOSIS — F1421 Cocaine dependence, in remission: Secondary | ICD-10-CM

## 2021-02-09 DIAGNOSIS — Z9189 Other specified personal risk factors, not elsewhere classified: Secondary | ICD-10-CM

## 2021-02-09 DIAGNOSIS — F4310 Post-traumatic stress disorder, unspecified: Secondary | ICD-10-CM | POA: Diagnosis not present

## 2021-02-09 DIAGNOSIS — F410 Panic disorder [episodic paroxysmal anxiety] without agoraphobia: Secondary | ICD-10-CM

## 2021-02-09 DIAGNOSIS — D72829 Elevated white blood cell count, unspecified: Secondary | ICD-10-CM

## 2021-02-09 DIAGNOSIS — E291 Testicular hypofunction: Secondary | ICD-10-CM

## 2021-02-09 DIAGNOSIS — J189 Pneumonia, unspecified organism: Secondary | ICD-10-CM

## 2021-02-09 DIAGNOSIS — F1021 Alcohol dependence, in remission: Secondary | ICD-10-CM

## 2021-02-09 DIAGNOSIS — F5101 Primary insomnia: Secondary | ICD-10-CM | POA: Diagnosis not present

## 2021-02-09 DIAGNOSIS — M2550 Pain in unspecified joint: Secondary | ICD-10-CM | POA: Diagnosis not present

## 2021-02-09 DIAGNOSIS — Z79899 Other long term (current) drug therapy: Secondary | ICD-10-CM

## 2021-02-09 DIAGNOSIS — R5383 Other fatigue: Secondary | ICD-10-CM | POA: Diagnosis not present

## 2021-02-09 DIAGNOSIS — R55 Syncope and collapse: Secondary | ICD-10-CM

## 2021-02-09 DIAGNOSIS — F1210 Cannabis abuse, uncomplicated: Secondary | ICD-10-CM

## 2021-02-09 DIAGNOSIS — E8809 Other disorders of plasma-protein metabolism, not elsewhere classified: Secondary | ICD-10-CM | POA: Diagnosis not present

## 2021-02-09 DIAGNOSIS — R9389 Abnormal findings on diagnostic imaging of other specified body structures: Secondary | ICD-10-CM

## 2021-02-09 DIAGNOSIS — T148XXA Other injury of unspecified body region, initial encounter: Secondary | ICD-10-CM

## 2021-02-09 DIAGNOSIS — D509 Iron deficiency anemia, unspecified: Secondary | ICD-10-CM

## 2021-02-09 DIAGNOSIS — F3176 Bipolar disorder, in full remission, most recent episode depressed: Secondary | ICD-10-CM

## 2021-02-09 DIAGNOSIS — Z634 Disappearance and death of family member: Secondary | ICD-10-CM | POA: Diagnosis not present

## 2021-02-09 DIAGNOSIS — F1010 Alcohol abuse, uncomplicated: Secondary | ICD-10-CM

## 2021-02-09 MED ORDER — LAMOTRIGINE 25 MG PO TABS: 25.0000 mg | ORAL_TABLET | Freq: Every day | ORAL | 1 refills | Status: AC

## 2021-02-09 MED ORDER — MUPIROCIN 2 % EX OINT
1.0000 | TOPICAL_OINTMENT | Freq: Two times a day (BID) | CUTANEOUS | 0 refills | Status: AC
Start: 2021-02-09 — End: ?

## 2021-02-09 NOTE — Progress Notes (Addendum)
Chief Complaint  Patient presents with  . Follow-up   F/u  1. PTSD worse since 01/18/21 was hospitalized in ICU at [place]  X 5-6 days in ICU on ventilator and 15 days in the hospital for [facility] + and alcohol intoxication level 103, multifocal pneumonia ? Aspiration negative for covid 19 he has moved to [place] but comes to [place] for MD appts and was at home in [place] and 1/2 roomates found him unconscious down in the home. Currently he is feeling better but still recovering after being in the hospital so long as far as physical deconditioning but in another year hopes to compete in athletic competition  He is seeing Dr. Shea Evans for psych and she requests EKG today and urine compliance drug screen Dr. Shea Evans per pt referred him to therapy in [place] Pt requests labs today esp crp, total and free testosterone  2. Hypogonadism f/u [facility] will cc labs to them  3. Abrasion to right back of head ?trauma  Review of Systems  Constitutional: Positive for weight loss.  HENT: Negative for hearing loss.   Eyes: Negative for blurred vision.  Respiratory: Negative for cough.   Cardiovascular: Negative for chest pain.  Gastrointestinal: Negative for abdominal pain.  Musculoskeletal: Negative for falls and joint pain.  Skin: Negative for rash.  Neurological: Negative for headaches.  Psychiatric/Behavioral: Positive for depression.       Substance abuse f/u psych   Past Medical History:  Diagnosis Date  . Bipolar disorder [facility])    psychiatrist in [place]  . Blood in stool   . Depression   . Drug abuse in remission [facility])    sober for 6 years but relapse 01/18/21 [facility] icu on ventilator for etoh/thc intoxication found with loc  . Fatty liver 01/2018  . H/O alcohol abuse   . Headache    migraines  . Hypertension   . Low testosterone in male   . Multifocal pneumonia    01/18/21 ? aspiration after thc and alcohol intoxication   Past Surgical History:  Procedure Laterality Date  .  APPENDECTOMY     2006  . COLONOSCOPY WITH PROPOFOL N/A 12/30/2017   Procedure: COLONOSCOPY WITH PROPOFOL;  Surgeon: Lin Landsman, MD;  Location[facility] ENDOSCOPY;  Service: Gastroenterology;  Laterality: N/A;  . ESOPHAGOGASTRODUODENOSCOPY (EGD) WITH PROPOFOL N/A 12/30/2017   Procedure: ESOPHAGOGASTRODUODENOSCOPY (EGD) WITH PROPOFOL;  Surgeon: Lin Landsman, MD;  Location: [facility] ENDOSCOPY;  Service: Gastroenterology;  Laterality: N/A;   Family History  Problem Relation Age of Onset  . Depression Mother   . Alcohol abuse Father   . Cancer Father        prostate dx'ed 16  . COPD Father   . Hyperlipidemia Father   . Hypertension Father    Social History   Socioeconomic History  . Marital status: Single    Spouse name: Not on file  . Number of children: Not on file  . Years of education: Not on file  . Highest education level: Not on file  Occupational History  . Occupation: disabled  Tobacco Use  . Smoking status: Former Smoker    Packs/day: 0.25    Years: 2.00    Pack years: 0.50    Types: Cigarettes    Quit date: 1999    Years since quitting: 23.3  . Smokeless tobacco: Former Systems developer    Types: Secondary school teacher  . Vaping Use: Former  Substance and Sexual Activity  . Alcohol use: Not Currently  Comment: former quit 03/14/2012  . Drug use: Not Currently    Comment: former quit cocaine 03/14/2012   . Sexual activity: Yes  Other Topics Concern  . 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 [EXPIRED] acetaminophen (TYLENOL) 325 MG tablet Take  by mouth.  . ACIDOPHILUS LACTOBACILLUS PO Take 1 capsule by mouth daily.  Marland Kitchen amLODipine (NORVASC) 10 MG tablet Take 1 tablet by mouth daily.  . busPIRone (BUSPAR) 30 MG tablet Take 1 tablet (30 mg total) by mouth in the morning and at bedtime.  . clomiPHENE (CLOMID) 50 MG tablet Take by mouth.  . folic acid (FOLVITE) 884 MCG tablet Take by mouth.  . gabapentin (NEURONTIN) 600 MG tablet Take 1 tablet (600 mg total) by mouth 3 (three) times daily.  Marland Kitchen lamoTRIgine (LAMICTAL) 100 MG tablet Take by mouth.  . lamoTRIgine (LAMICTAL) 25 MG tablet Take 1 tablet (25 mg total) by mouth daily. Take along with 100 mg daily  . Multiple Vitamin (MULTI-VITAMIN) tablet Take 1 tablet by mouth daily.  . mupirocin ointment (BACTROBAN) 2 % Apply 1 application topically 2 (two) times daily.  . Naproxen Sodium 220 MG CAPS Take by mouth.  . ondansetron (ZOFRAN-ODT) 4 MG disintegrating tablet Take 1 tablet (4 mg total) by mouth every 8 (eight) hours as needed. (Patient taking differently: Take 8 mg by mouth every 8 (eight) hours as needed.)  . pantoprazole (PROTONIX) 40 MG tablet Take by mouth.  . sildenafil (REVATIO) 20 MG tablet Take 2 tablets (40 mg total) by mouth daily in the afternoon.  . SUMAtriptan (IMITREX) 100 MG tablet Take 1 tablet (100 mg total) by mouth every 2 (two) hours as needed. Max dose 200 mg/24 hours. Limit to 2x per week  . zolpidem (AMBIEN) 10 MG tablet TAKE ONE TABLET BY MOUTH EVERY NIGHT AT BEDTIME AS NEEDED FOR SLEEP   No Known Allergies Recent Results (from the past 2160 hour(s))  Comprehensive metabolic panel     Status: Abnormal   Collection Time: 02/09/21  4:02 PM  Result Value Ref Range   Sodium 139 135 - 145 mEq/L   Potassium 4.1 3.5 - 5.1 mEq/L   Chloride 105 96 - 112 mEq/L   CO2 25 19 - 32 mEq/L   Glucose, Bld 108 (H) 70 - 99 mg/dL   BUN 16 6 - 23 mg/dL   Creatinine, Ser 0.98 0.40 - 1.50 mg/dL   Total Bilirubin 0.3 0.2 - 1.2 mg/dL   Alkaline Phosphatase 42 39 - 117 U/L   AST  26 0 - 37 U/L   ALT 26 0 - 53 U/L   Total Protein 6.8 6.0 - 8.3 g/dL   Albumin 4.1 3.5 - 5.2 g/dL   GFR 94.81 >60.00 mL/min    Comment: Calculated using the CKD-EPI Creatinine Equation (2021)   Calcium 9.0 8.4 - 10.5 mg/dL  CBC w/Diff     Status: Abnormal   Collection Time: 02/09/21  4:02 PM  Result Value Ref Range   WBC 5.7 4.0 - 10.5 K/uL   RBC 3.96 (L) 4.22 - 5.81 Mil/uL   Hemoglobin 12.9 (L) 13.0 - 17.0 g/dL   HCT 39.4 39.0 - 52.0 %   MCV 99.6 78.0 - 100.0 fl   MCHC 32.8 30.0 - 36.0 g/dL   RDW 15.0 11.5 - 15.5 %   Platelets 301.0 150.0 - 400.0 K/uL   Neutrophils Relative % 58.8 43.0 - 77.0 %   Lymphocytes Relative 27.7 12.0 - 46.0 %   Monocytes Relative 9.9 3.0 - 12.0 %   Eosinophils Relative 2.6 0.0 - 5.0 %   Basophils Relative 1.0 0.0 - 3.0 %   Neutro Abs 3.3 1.4 - 7.7 K/uL   Lymphs Abs 1.6 0.7 - 4.0 K/uL   Monocytes Absolute 0.6 0.1 - 1.0 K/uL   Eosinophils Absolute 0.1 0.0 - 0.7 K/uL   Basophils Absolute 0.1 0.0 - 0.1 K/uL  Iron, TIBC and Ferritin Panel     Status: Abnormal   Collection Time: 02/09/21  4:02 PM  Result Value Ref Range   Iron 65 50 - 180 mcg/dL   TIBC 323 250 - 425 mcg/dL (calc)   %SAT 20 20 - 48 % (calc)   Ferritin 391 (H) 38 - 380 ng/mL  C-reactive protein     Status: None   Collection Time: 02/09/21  4:02 PM  Result Value Ref Range   CRP <1.0 0.5 - 20.0 mg/dL  Lipoprotein A (LPA)     Status: None   Collection Time: 02/09/21  4:02 PM  Result Value Ref Range   Lipoprotein (a) 25 <75 nmol/L    Comment: . Risk Category   Optimal        < 75 nmol/L   Moderate   75 - 125 nmol/L   High          > 125 nmol/L . Cardiovascular event risk category cut points (optimal, moderate, high) are based on Tsimika S. JACC 9675;91:638-466. .   Testosterone Total,Free,Bio, Males-([facility])     Status: Abnormal   Collection Time: 02/09/21  4:02 PM  Result Value Ref Range   Testosterone 415 250 - 827 ng/dL   Albumin 4.1 3.6 - 5.1 g/dL   Sex Hormone Binding 35  10 - 50 nmol/L   Testosterone, Free 53.3 46.0 - 224.0 pg/mL   Testosterone, Bioavailable 100.3 (L) 110.0 - 575.0 ng/dL  Compliance Drug Analysis, Ur     Status: None (Preliminary result)   Collection Time: 02/09/21  4:11 PM  Result Value Ref Range   Summary WILL FOLLOW   Urinalysis, Routine w reflex microscopic     Status: None   Collection Time: 02/09/21  4:11 PM  Result Value Ref Range   Specific Gravity, UA 1.010 1.005 - 1.030   pH, UA 5.5 5.0 - 7.5   Color, UA Yellow Yellow   Appearance Ur Clear Clear   Leukocytes,UA Negative Negative   Protein,UA Negative Negative/Trace   Glucose, UA Negative Negative   Ketones, UA Negative Negative   RBC, UA Negative Negative   Bilirubin, UA Negative Negative   Urobilinogen, Ur 0.2 0.2 - 1.0 mg/dL   Nitrite, UA Negative Negative   Microscopic Examination Comment     Comment: Microscopic not indicated and not performed.  Urine Culture     Status: None   Collection Time: 02/09/21  4:11 PM   Urine  Result Value Ref Range   Urine Culture, Routine Final report    Organism  ID, Bacteria Comment     Comment: Culture shows less than 10,000 colony forming units of bacteria per milliliter of urine. This colony count is not generally considered to be clinically significant.   Med List Option Not Selected     Status: None (Preliminary result)   Collection Time: 02/09/21  4:11 PM  Result Value Ref Range   Med List Option Not Selected WILL FOLLOW    Objective  Body mass index is 30.6 kg/m. Wt Readings from Last 3 Encounters:  02/09/21 219 lb 6.4 oz (99.5 kg)  08/05/20 224 lb (101.6 kg)  02/02/20 210 lb (95.3 kg)   Temp Readings from Last 3 Encounters:  02/09/21 97.7 F (36.5 C) (Oral)  08/05/20 98.3 F (36.8 C) (Oral)  01/11/20 (!) 97.3 F (36.3 C) (Temporal)   BP Readings from Last 3 Encounters:  02/09/21 136/80  08/05/20 106/68  01/11/20 (!) 126/91   Pulse Readings from Last 3 Encounters:  02/09/21 83  08/05/20 75  01/11/20 (!)  57    Physical Exam Vitals and nursing note reviewed.  Constitutional:      Appearance: Normal appearance. He is well-developed and well-groomed.  HENT:     Head: Normocephalic and atraumatic.  Cardiovascular:     Rate and Rhythm: Normal rate and regular rhythm.     Heart sounds: Normal heart sounds. No murmur heard.   Pulmonary:     Effort: Pulmonary effort is normal.     Breath sounds: Normal breath sounds.  Abdominal:     Tenderness: There is no abdominal tenderness.  Skin:    General: Skin is warm and dry.       Neurological:     General: No focal deficit present.     Mental Status: He is alert and oriented to person, place, and time.     Gait: Gait normal.  Psychiatric:        Attention and Perception: Attention and perception normal.        Mood and Affect: Mood and affect normal.        Speech: Speech normal.        Behavior: Behavior normal. Behavior is cooperative.        Thought Content: Thought content normal.        Cognition and Memory: Cognition and memory normal.        Judgment: Judgment normal.     Assessment  Plan  Multifocal pneumonia 01/18/21 at [facility] hospitalized on ventilator ICU x 5-6 days in hospital x 15 days- Plan: DG Chest 2 View [facility] outpatient imaging 03/06/21 or after Will get pna 23 vaccine 03/06/21 in office IMPRESSION: Nonspecific bibasilar interstitial prominence. Otherwise no acute process.   Electronically Signed   By: Jerilynn Mages.  Shick M.D.   On: 03/02/2021 15:32  Will order CT chest   At risk for long QT syndrome - Plan: EKG 12-Lead Given copy for psych Dr. Shea Evans NSR   Abrasion right back of head- Plan: mupirocin ointment (BACTROBAN) 2 %  Leukocytosis, likely related to stress and b/l multifocal pneumonia 01/18/21 [facility]- Plan: Comprehensive metabolic panel, CBC w/Diff  Iron deficiency anemia, unspecified iron deficiency anemia type - Plan: CBC w/Diff, Iron, TIBC and Ferritin Panel  Hypogonadism in male Free and total T F/u [facility]  endocrine   Hypoalbuminemia - Plan: Comprehensive metabolic panel Consider premier protein shakes otc  Hematuria, unspecified type - Plan: Urinalysis, Routine w reflex microscopic, Urine Culture  Fatigue, likely physical deconditioning due to hosp x 15 days 12/2020-01/2021- Plan:  Comprehensive metabolic panel, CBC w/Diff, Iron, TIBC and Ferritin Panel, C-reactive protein  Syncope, likely due to etoh intoxication and thc r/o cardiac risk factor as cause - Plan: Lipoprotein A (LPA) ekg normal today nsr   Ptsd,bipolar depression, alcohol abuse 103 12/2020 blood alcohol level and +thc F/u Dr. Shea Evans urine per her request and EKG today pt given copy of ekg Medication management - Plan: Compliance Drug Analysis, Ur rec cessation of substance has theapist heidi in [place] per pt per psych   HM Flu shotutd Tdap utd  Pfizer 4/4  Declines MMR lab check Will come back for pna 23 vaccine 03/06/21  HIV neg 05/24/17 PSA, CBC, FSH, LH, test 06/01/20 with endocrine 06/01/20 [facility] tsh 0.38 and Ft4 0.77 01/19/21  Lipid panel nl 01/19/21 [facility]  PSA 0.51 08/31/20  Colonoscopy egd had 12/30/17 Dr. Marius Ditch  rec healthy diet and exercise   Provider: Dr. Olivia Mackie McLean-Scocuzza-[facility]

## 2021-02-09 NOTE — Progress Notes (Signed)
[facility] MD OP Progress Note  02/09/2021 1:15 PM Syncere Eble  MRN:  [id number]  Chief Complaint:  Chief Complaint    Follow-up; Anxiety; Alcohol Problem     HPI: Johnny Villa is a 45 year old male, currently lives in [place], disabled, has a history of PTSD, bipolar disorder, insomnia, alcohol use disorder, cocaine use disorder in remission was evaluated in office today.  Patient missed his last appointment on January 23, 2021.  Patient was admitted to [facility]- for intentional overdose of propranolol resulting in shock from resultant aspiration pneumonia.  Patient at that time stated that this was in the context of relapsing on alcohol.  I have reviewed medical records-discharge instruction from [facility]- 01/18/2021 - 02/01/2021-Dr.Yu, [place].  Patient today reports he was in a toxic relationship.  He sold his house in [place] and moved to [place] with his girlfriend who is also in recovery and her 49 year old son.  Patient reports that his girlfriend is a compulsive liar and lied about everything.  Patient reports her son would go out late at night and smoke cannabis.  Patient reports he relapsed on alcohol and that night when he had the overdose may have drank 1-1/2 bottle of wine.  Patient reports his overdose was not intentional since he blacked out and he does not remember what he did.  Patient reports he had a very traumatic experience at [facility].  He reports he was in a medically induced coma for several days.  Patient reports his medications were also changed at the hospital and currently he does not believe he is getting any help with his PTSD and anxiety since he continues to feel anxious and his tremors have worsened.  Patient continues to have a cough and reports he is still recovering from the aspiration pneumonia that he had.  Patient continues to deny suicidality and reports he loves life and would never do anything to hurt himself.   Patient reports that what happened was unintentional and that he did it while he was blacked out and has no memory of it.  Patient reports he did not follow through with recommendations but [facility] to follow-up with [facility] partial hospitalization program.  Patient reports since the program was not focused on his trauma he did not want to do the program.  He also reports group programs makes him more anxious.  Patient however is willing to do virtual partial hospitalization or IOP program if they are specialized in trauma. Patient reports he is trying to find a therapist who can give him EMDR for his trauma.  Patient reports  multiple psychosocial stressors including the death of his grandmother, selling the house, moving to a new place, toxic relationship, relapse on alcohol all of this could have pushed him over the edge.  Patient reports he wants to continue to be in [place] meetings and is very motivated to help himself and go through recovery again.  Patient reports he currently lives at the [facility] in [place] and that is a very good location since he feels it is very therapeutic for him.  Patient denies any other concerns today.  Visit Diagnosis:    ICD-10-CM   1. PTSD (post-traumatic stress disorder)  F43.10   2. Bipolar disorder, in full remission, most recent episode depressed ([place])  F31.76   3. Primary insomnia  F51.01   4. Bereavement  Z63.4   5. Alcohol use disorder, moderate, in early remission ([place])  F10.21   6.  Cocaine use disorder, moderate, in sustained remission ([name])  F14.21   7. High risk medication use  Z79.899 EKG 12-Lead    Compliance Drug Analysis, Ur    lamoTRIgine (LAMICTAL) 25 MG tablet    Past Psychiatric History: I have reviewed past psychiatric history from progress note on 05/04/2019.  Past trials of Klonopin, Lexapro, Rexulti, Wellbutrin, Lamictal, lithium, BuSpar, Elavil, Vraylar  Past Medical History:  Past Medical History:   Diagnosis Date  . Bipolar disorder [facility])    psychiatrist in [place]  . Blood in stool   . Depression   . Drug abuse in remission [facility])    sober for 6 years  . Fatty liver 01/2018  . H/O alcohol abuse   . Headache    migraines  . Hypertension   . Low testosterone in male     Past Surgical History:  Procedure Laterality Date  . APPENDECTOMY     2006  . COLONOSCOPY WITH PROPOFOL N/A 12/30/2017   Procedure: COLONOSCOPY WITH PROPOFOL;  Surgeon: Lin Landsman, MD;  Location: [facility] ENDOSCOPY;  Service: Gastroenterology;  Laterality: N/A;  . ESOPHAGOGASTRODUODENOSCOPY (EGD) WITH PROPOFOL N/A 12/30/2017   Procedure: ESOPHAGOGASTRODUODENOSCOPY (EGD) WITH PROPOFOL;  Surgeon: Lin Landsman, MD;  Location: [facility] ENDOSCOPY;  Service: Gastroenterology;  Laterality: N/A;    Family Psychiatric History: I have reviewed family psychiatric history from progress note on 05/04/2019  Family History:  Family History  Problem Relation Age of Onset  . Depression Mother   . Alcohol abuse Father   . Cancer Father        prostate dx'ed 45  . COPD Father   . Hyperlipidemia Father   . Hypertension Father     Social History: I have reviewed social history from progress note on 05/04/2019 Social History   Socioeconomic History  . Marital status: Single    Spouse name: Not on file  . Number of children: Not on file  . Years of education: Not on file  . Highest education level: Not on file  Occupational History  . Occupation: disabled  Tobacco Use  . Smoking status: Former Smoker    Packs/day: 0.25    Years: 2.00    Pack years: 0.50    Types: Cigarettes    Quit date: 1999    Years since quitting: 23.3  . Smokeless tobacco: Former Systems developer    Types: Secondary school teacher  . Vaping Use: Former  Substance and Sexual Activity  . Alcohol use: Not Currently    Comment: former quit 03/14/2012  . Drug use: Not Currently    Comment: former quit cocaine 03/14/2012   . Sexual activity: Yes  Other Topics  Concern  . 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    Social Determinants of Health   Financial Resource Strain: Not on file  Food Insecurity: Not on file  Transportation Needs: Not on file  Physical Activity: Not on file  Stress: Not on file  Social Connections: Not on file    Allergies: No Known Allergies  Metabolic Disorder Labs: Lab Results  Component Value Date   HGBA1C 5.6 02/26/2020   MPG 114.02 09/13/2019   No results found for: PROLACTIN Lab Results  Component Value Date   CHOL 175 08/08/2020   TRIG 44 08/08/2020  HDL 53 08/08/2020   CHOLHDL 3.3 08/08/2020   VLDL 11.6 02/26/2020   LDLCALC 113 (H) 08/08/2020   LDLCALC 158 (H) 02/26/2020   Lab Results  Component Value Date   TSH 1.520 08/08/2020   TSH 1.76 08/10/2019    Therapeutic Level Labs: Lab Results  Component Value Date   LITHIUM 0.4 (L) 06/23/2018   LITHIUM 0.8 10/25/2017   No results found for: VALPROATE No components found for:  CBMZ  Current Medications: Current Outpatient Medications  Medication Sig Dispense Refill  . acetaminophen (TYLENOL) 325 MG tablet Take by mouth.    . ACIDOPHILUS LACTOBACILLUS PO Take 1 capsule by mouth daily.    Marland Kitchen amLODipine (NORVASC) 10 MG tablet Take 1 tablet by mouth daily.    . busPIRone (BUSPAR) 30 MG tablet Take 1 tablet (30 mg total) by mouth in the morning and at bedtime. 180 tablet 1  . clomiPHENE (CLOMID) 50 MG tablet Take by mouth.    . folic acid (FOLVITE) A999333 MCG tablet Take by mouth.    . gabapentin (NEURONTIN) 600 MG tablet Take 1 tablet (600 mg total) by mouth 3 (three) times daily. 90 tablet 5  . lamoTRIgine (LAMICTAL) 100 MG tablet Take by mouth.    . lamoTRIgine (LAMICTAL) 25 MG tablet Take 1 tablet (25 mg total) by mouth daily. Take along with 100 mg daily 30 tablet 1  . Multiple Vitamin (MULTI-VITAMIN) tablet Take  1 tablet by mouth daily.    . Naproxen Sodium 220 MG CAPS Take by mouth.    . ondansetron (ZOFRAN-ODT) 4 MG disintegrating tablet Take 1 tablet (4 mg total) by mouth every 8 (eight) hours as needed. (Patient taking differently: Take 8 mg by mouth every 8 (eight) hours as needed.) 90 tablet 0  . pantoprazole (PROTONIX) 40 MG tablet Take by mouth.    . sildenafil (REVATIO) 20 MG tablet Take 2 tablets (40 mg total) by mouth daily in the afternoon. 60 tablet 11  . SUMAtriptan (IMITREX) 100 MG tablet Take 1 tablet (100 mg total) by mouth every 2 (two) hours as needed. Max dose 200 mg/24 hours. Limit to 2x per week 9 tablet 11  . zolpidem (AMBIEN) 10 MG tablet TAKE ONE TABLET BY MOUTH EVERY NIGHT AT BEDTIME AS NEEDED FOR SLEEP 90 tablet 0  . mupirocin ointment (BACTROBAN) 2 % Apply 1 application topically 2 (two) times daily. 30 g 0   No current facility-administered medications for this visit.     Musculoskeletal: Strength & Muscle Tone: UTA Gait & Station: normal Patient leans: N/A  Psychiatric Specialty Exam: Review of Systems  Respiratory: Positive for cough.   Neurological: Positive for tremors.  Psychiatric/Behavioral: The patient is nervous/anxious.   All other systems reviewed and are negative.   Blood pressure (!) 151/84, pulse 94, temperature 98.5 F (36.9 C), temperature source Temporal, weight 218 lb 9.6 oz (99.2 kg).Body mass index is 30.49 kg/m.  General Appearance: Casual  Eye Contact:  Fair  Speech:  Clear and Coherent  Volume:  Normal  Mood:  Anxious  Affect:  Appropriate  Thought Process:  Goal Directed and Descriptions of Associations: Intact  Orientation:  Full (Time, Place, and Person)  Thought Content: Logical   Suicidal Thoughts:  No  Homicidal Thoughts:  No  Memory:  Immediate;   Fair Recent;   Fair Remote;   Fair  Judgement:  Fair  Insight:  Fair  Psychomotor Activity:  Tremor  Concentration:  Concentration: Fair and Attention Span: Fair  Recall:  [place] of Knowledge: Fair  Language: Fair  Akathisia:  No  Handed:  Right  AIMS (if indicated): done  Assets:  Communication Skills Desire for Improvement Housing Social Support  ADL's:  Intact  Cognition: WNL  Sleep:  Fair   Screenings: AIMS   Flowsheet Row Admission (Discharged) from 09/09/2019 in [facility] Total Score 0    AUDIT   Flowsheet Row Admission (Discharged) from 09/09/2019 in [facility] Admission (Discharged) from 06/01/2019 in [facility]  Alcohol Use Disorder Identification Test Final Score (AUDIT) 40 33    GAD-7   Flowsheet Row Video Visit from 02/02/2020 in [facility]  Total GAD-7 Score 0    PHQ2-9   [place] Video Visit from 11/17/2020 in [facility] Counselor from 08/08/2020 in [facility] Video Visit from 02/02/2020 in [facility] Office Visit from 07/09/2019 in [facility] from 06/26/2019 in [facility]  PHQ-2 Total Score 1 0 0 2 0  PHQ-9 Total Score -- 0 -- 4 --    Flowsheet Row Video Visit from 11/17/2020 in [facility] Admission (Discharged) from 09/09/2019 in [facility] from 09/08/2019 in [facility] No Risk High Risk High Risk       Assessment and Plan: Rayhaan Huster is a 45 year old male, divorced, lives in [place], has a history of PTSD, bipolar disorder, alcohol use disorder with recent relapse, cocaine use disorder in remission, migraine headache was evaluated in office today.  Patient with multiple psychosocial stressors including death of his grandmother, recent relocation, recent break-up with girlfriend, was recently admitted to [facility] for intentional overdose ( although  patient reports otherwise ) , will benefit from the following plan. The patient demonstrates the following risk factors for suicide: Chronic risk factors for suicide include: psychiatric disorder of PTSD, BIpolar , substance use disorder, previous suicide attempts x 2, recent questionable ,unknown intentional, patient claims he blacked out after drinking alcohol and chronic pain. Acute risk factors for suicide include: family or marital conflict and recent discharge from inpatient psychiatry. Protective factors for this patient include: positive social support, positive therapeutic relationship, coping skills and hope for the future. Considering these factors, the overall suicide risk at this point appears to be low. Patient is appropriate for outpatient follow up.    Plan PTSD-unstable Although BuSpar was discontinued at the recent hospitalization patient reports he is currently back on it and takes BuSpar 30 mg twice a day Discussed with patient that in order to make further adjustment with his medications or start a new medication he will benefit from an EKG due to his recent hospitalization and overdose. Continue Ambien 10 mg p.o. nightly as needed-continued at his recent discharge from the hospital. Patient has been noncompliant with partial hospitalization referral but discharge summary at [facility].  Patient was referred to [facility]. Provided patient information for a therapist in the community- Ms.Heidi Birkner , [facility].  Bipolar disorder-unstable Continue Lamictal, we will increase Lamictal to 125 mg p.o. daily. Patient was recently restarted on Lamictal 50 mg however patient reports he increased the dosage himself after discharge to 100 mg daily. Discontinue Wellbutrin which was recently discontinued during his hospitalization.  Bereavement-patient will benefit from counseling  Primary insomnia-improving Continue Ambien as prescribed  Alcohol use disorder  moderate-early remission-  Will monitor patient closely. Patient to continue [place] meetings.  He recently had a relapse and is very motivated to go through his recovery again.  Cocaine use disorder in remission Will monitor closely  I have reviewed medical records from [facility] 01/18/2021 - 02/01/2021- patient reportedly on an alcohol binge in the last 2 days.  Patient was admitted to  CICU following a propranolol overdose-presumed 3200 mg.  Patient was found unresponsive by his girlfriend in their home around 9 PM.  She reports noticing his extremities were cold and his face was pale.  A bottle of propranolol 80 mg had 40 pills missing-3200 mg and he appeared to have taken an unknown amount of Ambien and drank 7-8 bottles of wine.  She last saw him at 8 AM prior to leaving work and noted she had missed a call from him at 43 PM.  After finding him unresponsive she called EMS. On arrival to the ED SBP was reportedly in the 60s.  He was started on epinephrine drip, intubated.  Chest x-ray revealed diffuse bilateral opacities consistent with aspiration.  EKG showed first-degree AV block with PR interval 206 with prior EKG in 2017 with PR 196.  QRS and QTC were within normal limits.  Patient admitted to CICU for continued management. According to patient's mother patient had 2 suicide attempts with cocaine overdoses however he was abstaining from alcohol use in the last month and only recently started drinking again.  Appears to have stopped taking his medication for bipolar disorder a week ago. Discharge plan- patient with 2 previous suicide attempt per collateral, discontinued all psychotropics 1 week before admission, going on a drinking binge and started using delta THC.  No signs of mania leading up to hospitalization.  Given severity of suicide attempt psych was consulted-patient was IVC d on 01/26/2021, transferred to med psych team after stabilization in the ICU.  Home gabapentin and  Ambien continued and Lamictal had to be restarted , titrated ,given length of time he was off of it.  He is on 50 mg as of discharge with instructions to increase to 100 mg on 02/02/2021.  Wellbutrin was discontinued as it had been months since he took it.  Patient consistently denied suicidality in the last several days of admission and engaged in safety planning.  Change of commitment form faxed to reverse IVC on 02/01/2021.  He and family amenable to discharge to home with plans for an intake with [facility] IOP/PHP on 02/02/2021.  Also arranged outpatient psychiatrist appointment with Dr. Shea Evans on 02/09/2021. Ethanol level-103 on presentation.  High risk medication use-we will repeat EKG.  EKG order provided to patient. We will order a urine drug compliance analysis.  Provided lab slip.  Follow-up in clinic in 2 to 3 weeks or sooner if needed in office.  This note was generated in part or whole with voice recognition software. Voice recognition is usually quite accurate but there are transcription errors that can and very often do occur. I apologize for any typographical errors that were not detected and corrected.           Ursula Alert, MD 02/09/2021, 6:57 PM

## 2021-02-10 ENCOUNTER — Telehealth: Payer: Self-pay

## 2021-02-10 LAB — CBC WITH DIFFERENTIAL/PLATELET
Basophils Absolute: 0.1 10*3/uL (ref 0.0–0.1)
Basophils Relative: 1 % (ref 0.0–3.0)
Eosinophils Absolute: 0.1 10*3/uL (ref 0.0–0.7)
Eosinophils Relative: 2.6 % (ref 0.0–5.0)
HCT: 39.4 % (ref 39.0–52.0)
Hemoglobin: 12.9 g/dL — ABNORMAL LOW (ref 13.0–17.0)
Lymphocytes Relative: 27.7 % (ref 12.0–46.0)
Lymphs Abs: 1.6 10*3/uL (ref 0.7–4.0)
MCHC: 32.8 g/dL (ref 30.0–36.0)
MCV: 99.6 fl (ref 78.0–100.0)
Monocytes Absolute: 0.6 10*3/uL (ref 0.1–1.0)
Monocytes Relative: 9.9 % (ref 3.0–12.0)
Neutro Abs: 3.3 10*3/uL (ref 1.4–7.7)
Neutrophils Relative %: 58.8 % (ref 43.0–77.0)
Platelets: 301 10*3/uL (ref 150.0–400.0)
RBC: 3.96 Mil/uL — ABNORMAL LOW (ref 4.22–5.81)
RDW: 15 % (ref 11.5–15.5)
WBC: 5.7 10*3/uL (ref 4.0–10.5)

## 2021-02-10 LAB — COMPREHENSIVE METABOLIC PANEL
ALT: 26 U/L (ref 0–53)
AST: 26 U/L (ref 0–37)
Albumin: 4.1 g/dL (ref 3.5–5.2)
Alkaline Phosphatase: 42 U/L (ref 39–117)
BUN: 16 mg/dL (ref 6–23)
CO2: 25 mEq/L (ref 19–32)
Calcium: 9 mg/dL (ref 8.4–10.5)
Chloride: 105 mEq/L (ref 96–112)
Creatinine, Ser: 0.98 mg/dL (ref 0.40–1.50)
GFR: 94.81 mL/min (ref >60.00)
Glucose, Bld: 108 mg/dL — ABNORMAL HIGH (ref 70–99)
Potassium: 4.1 mEq/L (ref 3.5–5.1)
Sodium: 139 mEq/L (ref 135–145)
Total Bilirubin: 0.3 mg/dL (ref 0.2–1.2)
Total Protein: 6.8 g/dL (ref 6.0–8.3)

## 2021-02-10 LAB — C-REACTIVE PROTEIN: CRP: <1 mg/dL (ref 0.5–20.0)

## 2021-02-10 LAB — MED LIST OPTION NOT SELECTED

## 2021-02-10 NOTE — Telephone Encounter (Signed)
Johnny Villa from [facility] states that they received a rx for lamictal 200mg  and they want to know if he suppose to start that or continue with the 100mg  and the 25mg  that he just received.

## 2021-02-10 NOTE — Telephone Encounter (Signed)
I did not provide Lamictal 200 mg for this patient. He should not be on it at this time. Please let pharmacy know.

## 2021-02-12 LAB — TESTOSTERONE TOTAL,FREE,BIO, MALES
Albumin: 4.1 g/dL (ref 3.6–5.1)
Sex Hormone Binding: 35 nmol/L (ref 10–50)
Testosterone, Bioavailable: 100.3 ng/dL — ABNORMAL LOW (ref 110.0–575.0)
Testosterone, Free: 53.3 pg/mL (ref 46.0–224.0)
Testosterone: 415 ng/dL (ref 250–827)

## 2021-02-12 LAB — IRON,TIBC AND FERRITIN PANEL
%SAT: 20 % (calc) (ref 20–48)
Ferritin: 391 ng/mL — ABNORMAL HIGH (ref 38–380)
Iron: 65 ug/dL (ref 50–180)
TIBC: 323 mcg/dL (calc) (ref 250–425)

## 2021-02-12 LAB — LIPOPROTEIN A (LPA): Lipoprotein (a): 25 nmol/L (ref <75)

## 2021-02-13 ENCOUNTER — Encounter: Payer: Self-pay | Admitting: Internal Medicine

## 2021-02-14 ENCOUNTER — Ambulatory Visit
Payer: Medicare Other | Attending: Student in an Organized Health Care Education/Training Program | Admitting: Student in an Organized Health Care Education/Training Program

## 2021-02-14 ENCOUNTER — Other Ambulatory Visit: Payer: Self-pay

## 2021-02-14 ENCOUNTER — Encounter: Payer: Self-pay | Admitting: Student in an Organized Health Care Education/Training Program

## 2021-02-14 VITALS — BP 149/92 | HR 100 | Temp 97.3°F | Resp 16 | Ht 71.0 in | Wt 219.0 lb

## 2021-02-14 DIAGNOSIS — M5416 Radiculopathy, lumbar region: Secondary | ICD-10-CM

## 2021-02-14 DIAGNOSIS — M47812 Spondylosis without myelopathy or radiculopathy, cervical region: Secondary | ICD-10-CM | POA: Diagnosis not present

## 2021-02-14 DIAGNOSIS — M7918 Myalgia, other site: Secondary | ICD-10-CM | POA: Diagnosis not present

## 2021-02-14 NOTE — Progress Notes (Addendum)
PROVIDER NOTE: Information contained herein reflects review and annotations entered in association with encounter. Interpretation of such information and data should be left to medically-trained personnel. Information provided to patient can be located elsewhere in the medical record under "Patient Instructions". Document created using STT-dictation technology, any transcriptional errors that may result from process are unintentional.    Patient: Johnny Villa  Service Category: E/M  Provider: Gillis Santa, MD  DOB: May 20, 1976  DOS: 02/14/2021  Specialty: Interventional Pain Management  MRN: [id number]  Setting: Ambulatory outpatient  PCP: McLean-Scocuzza, Nino Glow, MD  Type: Established Patient    Referring Provider: Orland Mustard *  Location: Office  Delivery: Face-to-face     HPI  Johnny Villa, a 45 y.o. year old male, is here today because of his Cervical facet joint syndrome [M47.812]. Mr. Shanks primary complain today is Headache, Neck Pain, Arm Pain, Hand Pain, Back Pain, Knee Pain, Ankle Pain, and Foot Pain  Pertinent problems: Johnny Villa has Cervicalgia; Chronic right shoulder pain; Cervical spondylosis without myelopathy (C4,5,6,7); Cervical facet joint syndrome; and Lumbar radicular pain on their pertinent problem list. Pain Assessment: Severity of Chronic pain is reported as a 9 /10. Location: Head (multiple) Right,Left/denies. Onset: More than a month ago. Quality:  (all of the above). Timing: Constant. Modifying factor(s): TPI< facet injection, gabapentin. Vitals:  height is 5' 11"  (1.803 m) and weight is 219 lb (99.3 kg). His temporal temperature is 97.3 F (36.3 C) (abnormal). His blood pressure is 149/92 (abnormal) and his pulse is 100. His respiration is 16 and oxygen saturation is 97%.   Reason for encounter: follow-up evaluation   Roni follows up today for increased right-sided neck pain that radiates into his right trapezius, right shoulder, right proximal  bicep.  He also has left-sided neck pain that radiates into his distal shoulder.  This is related to cervical facet joint syndrome.  He is status post cervical facet radiofrequency ablation bilaterally at C4, C5, C6, C7 with me over 2 years ago that provided him with significant pain relief, improvement in his cervical range of motion and functional status (right C4, C5, C6, C7 radiofrequency ablation 05/19/2018).  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  He feels that he has exacerbated his neck during training.  He has tried physical therapy exercises that he is learned at the gym with limited response.  He takes gabapentin as well as naproxen with limited response.  We discussed repeating bilateral C4, C5, C6 cervical facet medial branch nerve blocks as well as trigger point injection in his trapezius region.  Patient also endorses low back pain with radiation into his right leg in a dermatomal fashion.  He had a right-sided L4-L5 ESI done 01/11/2020 that provided 80% pain relief for greater than 6 months.  Given sciatica flareup patient is requesting a repeat in his lumbar epidural steroid injection as well.  ROS  Constitutional: Denies any fever or chills Gastrointestinal: No reported hemesis, hematochezia, vomiting, or acute GI distress Musculoskeletal: Neck pain with radiation into right greater than left shoulder, low back pain with radiation into right lateral calf Neurological: No reported episodes of acute onset apraxia, aphasia, dysarthria, agnosia, amnesia, paralysis, loss of coordination, or loss of consciousness  Medication Review  Lactobacillus, Multi-Vitamin, Naproxen Sodium, SUMAtriptan, amLODipine, busPIRone, clomiPHENE, folic acid, gabapentin, lamoTRIgine, mupirocin ointment, ondansetron, pantoprazole, sildenafil, and zolpidem  History Review  Allergy: Mr. [name] has No Known Allergies. Drug: Johnny Villa  reports previous drug use. Alcohol:  reports  previous  alcohol use. Tobacco:  reports that he quit smoking about 23 years ago. His smoking use included cigarettes. He has a 0.50 pack-year smoking history. He has quit using smokeless tobacco.  His smokeless tobacco use included chew. Social: Mr. [name]  reports that he quit smoking about 23 years ago. His smoking use included cigarettes. He has a 0.50 pack-year smoking history. He has quit using smokeless tobacco.  His smokeless tobacco use included chew. He reports previous alcohol use. He reports previous drug use. Medical:  has a past medical history of Bipolar disorder ([place]), Blood in stool, Depression, Drug abuse in remission ([place]), Fatty liver (01/2018), H/O alcohol abuse, Headache, Hypertension, Low testosterone in male, Multifocal pneumonia, and PTSD (post-traumatic stress disorder). Surgical: Johnny Villa  has a past surgical history that includes Appendectomy; Colonoscopy with propofol (N/A, 12/30/2017); and Esophagogastroduodenoscopy (egd) with propofol (N/A, 12/30/2017). Family: family history includes Alcohol abuse in his father; COPD in his father; Cancer in his father; Depression in his mother; Hyperlipidemia in his father; Hypertension in his father.  Laboratory Chemistry Profile   Renal Lab Results  Component Value Date   BUN 16 02/09/2021   CREATININE 0.98 02/09/2021   BCR 19 08/08/2020   GFR 94.81 02/09/2021   GFRAA 93 08/08/2020   GFRNONAA 81 08/08/2020     Hepatic Lab Results  Component Value Date   AST 26 02/09/2021   ALT 26 02/09/2021   ALBUMIN 4.1 02/09/2021   ALKPHOS 42 02/09/2021     Electrolytes Lab Results  Component Value Date   NA 139 02/09/2021   K 4.1 02/09/2021   CL 105 02/09/2021   CALCIUM 9.0 02/09/2021   MG 2.3 09/13/2019   PHOS 3.4 09/13/2019     Bone Lab Results  Component Value Date   VD25OH 49.6 08/08/2020   TESTOFREE 53.3 02/09/2021   TESTOSTERONE 415 02/09/2021     Inflammation (CRP: Acute Phase) (ESR: Chronic Phase) Lab Results   Component Value Date   CRP <1.0 02/09/2021   ESRSEDRATE 5 10/25/2017       Note: Above Lab results reviewed.  Recent Imaging Review  DG FLUORO GUIDED NEEDLE PLC ASPIRATION/INJECTION LOC CLINICAL DATA:  Right shoulder pain.  EXAM: RIGHT SHOULDER ARTHROGRAM UNDER FLUOROSCOPY  COMPARISON:  None.  FLUOROSCOPY TIME:  Fluoroscopy Time:  0.1 minute  Radiation Exposure Index (if provided by the fluoroscopic device): 0.1 mGy  Number of Acquired Spot Images: 0  PROCEDURE: The risks and benefits of the procedure were discussed with the patient, and written informed consent was obtained. The patient stated no history of allergy to contrast media. A formal timeout procedure was performed with the patient according to departmental protocol.  The patient was placed supine on the fluoroscopy table and the right glenohumeral joint was identified under fluoroscopy. The skin overlying the right glenohumeral joint was subsequently cleaned with Chloraprep and a sterile drape was placed over the area of interest. 5 ml 1% Lidocaine was used to anesthetize the skin around the needle insertion site.  A 22 gauge spinal needle was inserted into the right glenohumeral joint under fluoroscopy. Position was confirmed with injection of less than 70m of Omnipaque 180 under fluoroscopy.  12 ml of gadolinium mixture (0.05 mL of Gadavist mixed with 10 mL sterile saline and 10 mL Omnipaque 180) was injected into the right glenohumeral joint.  The needle was removed and hemostasis was achieved. The patient was subsequently transferred to MRI for imaging.  IMPRESSION: Technically successful right shoulder arthrogram under  fluoroscopy.  Electronically Signed   By: Kathreen Devoid   On: 09/14/2020 14:28 MR SHOULDER RIGHT W CONTRAST CLINICAL DATA:  Right shoulder pain with limited range of motion for 4 months  EXAM: MR ARTHROGRAM OF THE RIGHT SHOULDER  TECHNIQUE: Multiplanar, multisequence MR  imaging of the right shoulder was performed following the administration of intra-articular contrast.  CONTRAST:  See Injection Documentation.  COMPARISON:  X-ray 11/26/2017  FINDINGS: Rotator cuff: Supraspinatus, infraspinatus, subscapularis, and teres minor tendons intact. No rotator cuff tear.  Muscles: Preserved bulk and signal intensity of the rotator cuff musculature without edema, atrophy, or fatty infiltration.  Biceps long head: Mild intra-articular biceps tendinosis.  Acromioclavicular Joint: Minimal arthropathy of the AC joint. No fluid or contrast present within the subacromial-subdeltoid bursa.  Glenohumeral Joint: Glenohumeral joint is well distended with injected contrast. No intra-articular loose body. No cartilage defect.  Labrum: Complex tearing of the superior labrum extending posteriorly from the level of the biceps anchor with involvement of at least the 10:00 to 12:00 positions. Abnormal cleft at the posterior chondrolabral junction extending to the posteroinferior glenoid (series 5, images 12-14). Prominent sublabral foramen.  Bones: No acute fracture. No dislocation. No bone marrow edema. No suspicious bone lesion.  IMPRESSION: 1. Complex SLAP tear. Abnormal cleft at the posterior chondrolabral junction extending to the posteroinferior glenoid. 2. Mild intra-articular biceps tendinosis. 3. Intact rotator cuff without tear.  Electronically Signed   By: Davina Poke D.O.   On: 09/14/2020 13:35 Note: Reviewed        Physical Exam  General appearance: Well nourished, well developed, and well hydrated. In no apparent acute distress Mental status: Alert, oriented x 3 (person, place, & time)       Respiratory: No evidence of acute respiratory distress Eyes: PERLA Vitals: BP (!) 149/92 (BP Location: Right Arm, Patient Position: Sitting, Cuff Size: Normal)   Pulse 100   Temp (!) 97.3 F (36.3 C) (Temporal)   Resp 16   Ht 5' 11"  (1.803 m)   Wt  219 lb (99.3 kg)   SpO2 97%   BMI 30.54 kg/m  BMI: Estimated body mass index is 30.54 kg/m as calculated from the following:   Height as of this encounter: 5' 11"  (1.803 m).   Weight as of this encounter: 219 lb (99.3 kg). Ideal: Ideal body weight: 75.3 kg (166 lb 0.1 oz) Adjusted ideal body weight: 84.9 kg (187 lb 3.3 oz)  Cervical Spine Area Exam  Skin & Axial Inspection: No masses, redness, edema, swelling, or associated skin lesions Alignment: Symmetrical Functional ROM: Unrestricted ROM      Stability: No instability detected Muscle Tone/Strength: Functionally intact. No obvious neuro-muscular anomalies detected. Sensory (Neurological): Musculoskeletal pain pattern Palpation: No palpable anomalies             Lumbar Spine Area Exam  Skin & Axial Inspection: No masses, redness, or swelling Alignment: Symmetrical Functional ROM: Unrestricted ROM       Stability: No instability detected Muscle Tone/Strength: Functionally intact. No obvious neuro-muscular anomalies detected. Sensory (Neurological): Dermatomal pain pattern  Lower Extremity Exam    Side: Right lower extremity  Side: Left lower extremity  Stability: No instability observed          Stability: No instability observed          Skin & Extremity Inspection: Skin color, temperature, and hair growth are WNL. No peripheral edema or cyanosis. No masses, redness, swelling, asymmetry, or associated skin lesions. No contractures.  Skin & Extremity  Inspection: Skin color, temperature, and hair growth are WNL. No peripheral edema or cyanosis. No masses, redness, swelling, asymmetry, or associated skin lesions. No contractures.  Functional ROM: Unrestricted ROM                  Functional ROM: Unrestricted ROM                  Muscle Tone/Strength: Functionally intact. No obvious neuro-muscular anomalies detected.  Muscle Tone/Strength: Functionally intact. No obvious neuro-muscular anomalies detected.  Sensory (Neurological):  Dermatomal pain pattern        Sensory (Neurological): Unimpaired        DTR: Patellar: deferred today Achilles: deferred today Plantar: deferred today  DTR: Patellar: deferred today Achilles: deferred today Plantar: deferred today  Palpation: No palpable anomalies  Palpation: No palpable anomalies    Assessment   Status Diagnosis  Having a Flare-up Having a Flare-up Having a Flare-up 1. Cervical facet joint syndrome   2. Myofascial pain syndrome, cervical   3. Lumbar radiculopathy   4. Lumbar radicular pain      Updated Problems: Problem  Lumbar Radicular Pain  Cervical spondylosis without myelopathy (C4,5,6,7)  Cervical Facet Joint Syndrome  Cervicalgia  Chronic Right Shoulder Pain    Plan of Care   Mr. Jovane Foutz has a current medication list which includes the following long-term medication(s): gabapentin, lamotrigine, lamotrigine, lamotrigine, sumatriptan, and zolpidem.  1. Cervical facet joint syndrome - CERVICAL FACET (MEDIAL BRANCH NERVE BLOCK) ; Future  2. Myofascial pain syndrome, cervical - TRIGGER POINT INJECTION; Future  3. Lumbar radiculopathy - Lumbar Epidural Injection; Future  4. Lumbar radicular pain - Lumbar Epidural Injection; Future   Orders:  Orders Placed This Encounter  Procedures  . CERVICAL FACET (MEDIAL BRANCH NERVE BLOCK)     Standing Status:   Future    Standing Expiration Date:   03/17/2021    Scheduling Instructions:     Side: Bilateral     Level: C4-5, C5-6 Facet joints (C4, C5, C6,  Medial Branch Nerves)     Sedation: Patient's choice.     Timeframe: As soon as schedule allows    Order Specific Question:   Where will this procedure be performed?    Answer:   [facility]  . TRIGGER POINT INJECTION    Standing Status:   Future    Standing Expiration Date:   05/17/2021    Scheduling Instructions:     Trapezius    Order Specific Question:   Where will this procedure be performed?    Answer:   [facility]  . Lumbar Epidural Injection    Standing Status:   Future    Standing Expiration Date:   03/17/2021    Scheduling Instructions:     Procedure: Interlaminar Lumbar Epidural Steroid injection (LESI)            Laterality: Midline     Sedation: Patient's choice.     Timeframe: ASAA    Order Specific Question:   Where will this procedure be performed?    Answer:   [facility]   Follow-up plan:   Return in 2 weeks (on 02/28/2021) for B/L C4-6 Fcts with TPI , without sedation.     Status post right C4, C5, C6, C7 RFA on 05/19/2018 for cervical spondylosis.  Status post right L4-L5 ESI #1 on 01/11/2020, repeat as needed       Recent Visits Date Type Provider Dept  12/29/20 Telemedicine Gillis Santa,  MD [facility]  Showing recent visits within past 90 days and meeting all other requirements Today's Visits Date Type Provider Dept  02/14/21 Office Visit Gillis Santa, MD [facility]  Showing today's visits and meeting all other requirements Future Appointments Date Type Provider Dept  03/06/21 Appointment Gillis Santa, MD [facility]  03/20/21 Appointment Gillis Santa, MD [facility]  Showing future appointments within next 90 days and meeting all other requirements  I discussed the assessment and treatment plan with the patient. The patient was provided an opportunity to ask questions and all were answered. The patient agreed with the plan and demonstrated an understanding of the instructions.  Patient advised to call back or seek an in-person evaluation if the symptoms or condition worsens.  Duration of encounter:30 minutes.  Note by: Gillis Santa, MD Date: 02/14/2021; Time: 9:00 AM

## 2021-02-14 NOTE — Patient Instructions (Signed)
Preparing for your procedure (without sedation) Instructions: Oral Intake: Do not eat or drink anything for at least 3 hours prior to your procedure. Transportation: Unless otherwise stated by your physician, you may drive yourself after the procedure. Blood Pressure Medicine: Take your blood pressure medicine with a sip of water the morning of the procedure. Insulin: Take only  of your normal insulin dose. Preventing infections: Shower with an antibacterial soap the morning of your procedure. Build-up your immune system: Take 1000 mg of Vitamin C with every meal (3 times a day) the day prior to your procedure. Pregnancy: If you are pregnant, call and cancel the procedure. Sickness: If you have a cold, fever, or any active infections, call and cancel the procedure. Arrival: You must be in the facility at least 30 minutes prior to your scheduled procedure. Children: Do not bring any children with you. Dress appropriately: Bring dark clothing that you would not mind if they get stained. Valuables: Do not bring any jewelry or valuables. Procedure appointments are reserved for interventional treatments only. No Prescription Refills. No medication changes will be discussed during procedure appointments. No disability issues will be discussed. Trigger Point Injections Patient Information  Description: Trigger points are areas of muscle sensitive to touch which cause pain with movement, sometimes felt some distance from the site of palpation.  Usually the muscle containing these trigger points if felt as a tight band or knot.   The area of maximum tenderness or trigger point is identified, and after antiseptic preparation of the skin, a small needle is placed into this site.  Reproduction of the pain often occurs and numbing medicine (local anesthetic) is injected into the site, sometimes along with steroid preparation.  The entire block usually lasts less than 5 minutes.  Conditions which may be  treated by trigger points:  Muscular pain and spasm Nerve irritation  Preparation for the injection:  Do not eat any solid food or dairy products within 8 hours of your appointment. You may drink clear liquids up to 3 hours before appointment.  Clear liquids include water, black coffee, juice or soda.  No milk or cream please. You may take your regular medications, including pain medications, with a sip of water before your appointment.  Diabetics should hold regular insulin ( if take separately) and take 1/2 normal NPH dose the morning of the procedure.  Carry some sugar containing items with you to your appointment. A driver must accompany you and be prepared to drive you home after your procedure.  Bring all your current medications with you. An IV may be inserted and sedation may be given at the discretion of the physician.  A blood pressure cuff, EKG, and other monitors will often be applied during the procedure.  Some patients may need to have extra oxygen administered for a short period. You will be asked to provide medical information, including your allergies and medications, prior to the procedure.  We must know immediately if you are taking blood thinners (like Coumadin/Warfarin) or if you are allergic to IV iodine contrast (dye).  We must know if you could possibly be pregnant.  Possible side-effects:  Bleeding from needle site Infection (rare, may require surgery) Nerve injury (rare) Numbness & tingling (temporary) Punctured lung (if injection around chest) Light-headedness (temporary) Pain at injection site (several days) Decreased blood pressure (rare, temporary) Weakness in arm/leg (temporary)  Call if you experience:  Hive or difficulty breathing (go to the emergency room) Inflammation or drainage at the injection   Hive or difficulty breathing (go to the emergency room) Inflammation or drainage at the injection site(s)  Please note:  Although the local anesthetic injected can often make your painful muscle feel good for  several hours after the injection, the pain may return.  It takes 3-7 days for steroids to work.  You may not notice any pain relief for at least one week.  If effective, we will often do a series of injections spaced 3-6 weeks apart to maximally decrease your pain.  If you have any questions please call [phone] [facility] Patient Information  Description: The facets are joints in the spine between the vertebrae.  Like any joints in the body, facets can become irritated and painful.  Arthritis can also effect the facets.  By injecting steroids and local anesthetic in and around these joints, we can temporarily block the nerve supply to them.  Steroids act directly on irritated nerves and tissues to reduce selling and inflammation which often leads to decreased pain.  Facet blocks may be done anywhere along the spine from the neck to the low back depending upon the location of your pain.   After numbing the skin with local anesthetic (like Novocaine), a small needle is passed onto the facet joints under x-ray guidance.  You may experience a sensation of pressure while this is being done.  The entire block usually lasts about 15-25 minutes.   Conditions which may be treated by facet blocks:  Low back/buttock pain Neck/shoulder pain Certain types of headaches  Preparation for the injection:  Do not eat any solid food or dairy products within 8 hours of your appointment. You may drink clear liquid up to 3 hours before appointment.  Clear liquids include water, black coffee, juice or soda.  No milk or cream please. You may take your regular medication, including pain medications, with a sip of water before your appointment.  Diabetics should hold regular insulin (if taken separately) and take 1/2 normal NPH dose the morning of the procedure.  Carry some sugar containing items with you to your appointment. A driver must accompany you and be  prepared to drive you home after your procedure. Bring all your current medications with you. An IV may be inserted and sedation may be given at the discretion of the physician. A blood pressure cuff, EKG and other monitors will often be applied during the procedure.  Some patients may need to have extra oxygen administered for a short period. You will be asked to provide medical information, including your allergies and medications, prior to the procedure.  We must know immediately if you are taking blood thinners (like Coumadin/Warfarin) or if you are allergic to IV iodine contrast (dye).  We must know if you could possible be pregnant.  Possible side-effects:  Bleeding from needle site Infection (rare, may require surgery) Nerve injury (rare) Numbness & tingling (temporary) Difficulty urinating (rare, temporary) Spinal headache (a headache worse with upright posture) Light-headedness (temporary) Pain at injection site (serveral days) Decreased blood pressure (rare, temporary) Weakness in arm/leg (temporary) Pressure sensation in back/neck (temporary)   Call if you experience:  Fever/chills associated with headache or increased back/neck pain Headache worsened by an upright position New onset, weakness or numbness of an extremity below the injection site Hives or difficulty breathing (go to the emergency room) Inflammation or drainage at the injection site(s) Severe back/neck pain greater than usual New symptoms which are concerning to you  Please note:  Although the local anesthetic injected can often make your back or neck feel good for several hours after the injection, the pain will likely return. It takes 3-7 days for steroids to work.  You may not notice any pain relief for at least one week.  If effective, we will often do a series of 2-3 injections spaced 3-6 weeks apart to maximally decrease your pain.  After the initial series, you may be a candidate for a more  permanent nerve block of the facets.  If you have any questions, please call #[phone]) [facility] Clinic[facility] In[facility] Patient Information  Description: The epidural space surrounds the nerves as they exit the spinal cord.  In some patients, the nerves can be compressed and inflamed by a bulging disc or a tight spinal canal (spinal stenosis).  By injecting steroids into the epidural space, we can bring irritated nerves into direct contact with a potentially helpful medication.  These steroids act directly on the irritated nerves and can reduce swelling and inflammation which often leads to decreased pain.  Epidural steroids may be injected anywhere along the spine and from the neck to the low back depending upon the location of your pain.   After numbing the skin with local anesthetic (like Novocaine), a small needle is passed into the epidural space slowly.  You may experience a sensation of pressure while this is being done.  The entire block usually last less than 10 minutes.  Conditions which may be treated by epidural steroids:  Low back and leg pain Neck and arm pain Spinal stenosis Post-laminectomy syndrome Herpes zoster (shingles) pain Pain from compression fractures  Preparation for the injection:  Do not eat any solid food or dairy products within 8 hours of your appointment.  You may drink clear liquids up to 3 hours before appointment.  Clear liquids include water, black coffee, juice or soda.  No milk or cream please. You may take your regular medication, including pain medications, with a sip of water before your appointment  Diabetics should hold regular insulin (if taken separately) and take 1/2 normal NPH dos the morning of the procedure.  Carry some sugar containing items with you to your appointment. A driver must accompany you and be prepared to drive you home after your procedure.  Bring all your current medications with  your. An IV may be inserted and sedation may be given at the discretion of the physician.   A blood pressure cuff, EKG and other monitors will often be applied during the procedure.  Some patients may need to have extra oxygen administered for a short period. You will be asked to provide medical information, including your allergies, prior to the procedure.  We must know immediately if you are taking blood thinners (like Coumadin/Warfarin)  Or if you are allergic to IV iodine contrast (dye). We must know if you could possible be pregnant.  Possible side-effects: Bleeding from needle site Infection (rare, may require surgery) Nerve injury (rare) Numbness & tingling (temporary) Difficulty urinating (rare, temporary) Spinal headache ( a headache worse with upright posture) Light -headedness (temporary) Pain at injection site (several days) Decreased blood pressure (temporary) Weakness in arm/leg (temporary) Pressure sensation in back/neck (temporary)  Call if you experience: Fever/chills associated with headache or increased back/neck pain. Headache worsened by an upright position. New onset weakness or numbness of an extremity below the injection site Hives or difficulty breathing (go to the emergency room) Inflammation or drainage at the  infection site Severe back/neck pain Any new symptoms which are concerning to you  Please note:  Although the local anesthetic injected can often make your back or neck feel good for several hours after the injection, the pain will likely return.  It takes 3-7 days for steroids to work in the epidural space.  You may not notice any pain relief for at least that one week.  If effective, we will often do a series of three injections spaced 3-6 weeks apart to maximally decrease your pain.  After the initial series, we generally will wait several months before considering a repeat injection of the same type.  If you have any questions, please call [phone] [facility]

## 2021-02-14 NOTE — Progress Notes (Signed)
Safety precautions to be maintained throughout the outpatient stay will include: orient to surroundings, keep bed in low position, maintain call bell within reach at all times, provide assistance with transfer out of bed and ambulation.  

## 2021-02-15 LAB — URINALYSIS, ROUTINE W REFLEX MICROSCOPIC
Bilirubin, UA: NEGATIVE
Glucose, UA: NEGATIVE
Ketones, UA: NEGATIVE
Leukocytes,UA: NEGATIVE
Nitrite, UA: NEGATIVE
Protein,UA: NEGATIVE
RBC, UA: NEGATIVE
Specific Gravity, UA: 1.01 (ref 1.005–1.030)
Urobilinogen, Ur: 0.2 mg/dL (ref 0.2–1.0)
pH, UA: 5.5 (ref 5.0–7.5)

## 2021-02-15 LAB — URINE CULTURE

## 2021-02-15 LAB — COMPLIANCE DRUG ANALYSIS, UR

## 2021-02-16 ENCOUNTER — Other Ambulatory Visit: Payer: Self-pay | Admitting: Psychiatry

## 2021-02-16 DIAGNOSIS — F5105 Insomnia due to other mental disorder: Secondary | ICD-10-CM

## 2021-02-21 ENCOUNTER — Encounter: Payer: Self-pay | Admitting: Internal Medicine

## 2021-02-21 DIAGNOSIS — G43909 Migraine, unspecified, not intractable, without status migrainosus: Secondary | ICD-10-CM

## 2021-02-21 DIAGNOSIS — N529 Male erectile dysfunction, unspecified: Secondary | ICD-10-CM

## 2021-02-21 MED ORDER — ONDANSETRON 4 MG PO TBDP: 8.0000 mg | ORAL_TABLET | Freq: Three times a day (TID) | ORAL | 1 refills | Status: DC | PRN

## 2021-02-21 MED ORDER — SUMATRIPTAN SUCCINATE 100 MG PO TABS: 100.0000 mg | ORAL_TABLET | ORAL | 11 refills | Status: AC | PRN

## 2021-02-21 MED ORDER — PANTOPRAZOLE SODIUM 40 MG PO TBEC: 40.0000 mg | DELAYED_RELEASE_TABLET | Freq: Every day | ORAL | 2 refills | Status: AC

## 2021-02-21 MED ORDER — AMLODIPINE BESYLATE 10 MG PO TABS: 1.0000 | ORAL_TABLET | Freq: Every day | ORAL | 11 refills | Status: AC

## 2021-02-23 ENCOUNTER — Telehealth (INDEPENDENT_AMBULATORY_CARE_PROVIDER_SITE_OTHER): Payer: Medicare Other | Admitting: Psychiatry

## 2021-02-23 ENCOUNTER — Telehealth: Payer: Self-pay

## 2021-02-23 ENCOUNTER — Other Ambulatory Visit: Payer: Self-pay

## 2021-02-23 ENCOUNTER — Telehealth (HOSPITAL_COMMUNITY): Payer: Self-pay | Admitting: Licensed Clinical Social Worker

## 2021-02-23 ENCOUNTER — Encounter: Payer: Self-pay | Admitting: Psychiatry

## 2021-02-23 DIAGNOSIS — F1421 Cocaine dependence, in remission: Secondary | ICD-10-CM

## 2021-02-23 DIAGNOSIS — F1021 Alcohol dependence, in remission: Secondary | ICD-10-CM

## 2021-02-23 DIAGNOSIS — F3163 Bipolar disorder, current episode mixed, severe, without psychotic features: Secondary | ICD-10-CM

## 2021-02-23 DIAGNOSIS — F5101 Primary insomnia: Secondary | ICD-10-CM | POA: Diagnosis not present

## 2021-02-23 DIAGNOSIS — F4310 Post-traumatic stress disorder, unspecified: Secondary | ICD-10-CM

## 2021-02-23 DIAGNOSIS — Z634 Disappearance and death of family member: Secondary | ICD-10-CM

## 2021-02-23 MED ORDER — OLANZAPINE 5 MG PO TABS: 5.0000 mg | ORAL_TABLET | Freq: Every day | ORAL | 0 refills | Status: DC

## 2021-02-23 MED ORDER — OLANZAPINE 5 MG PO TABS: 5.0000 mg | ORAL_TABLET | Freq: Every day | ORAL | 1 refills | Status: DC

## 2021-02-23 NOTE — Telephone Encounter (Signed)
Was able to talk to the patient and he denied any safety concern, denied any suicidality or homicidality. Patient provided his girlfriend's phone number to discuss concerns while the police officers were at the house with him.  Patient after that was going to talk to the police officers who had reached his house for a welfare check.

## 2021-02-23 NOTE — Telephone Encounter (Signed)
[place] police dept office Alcario Drought called back states that the house was up for sale and that according to the neighbors patient moved about 3 months ago.

## 2021-02-23 NOTE — Telephone Encounter (Signed)
per dr. Shea Evans request she ask me to call patient. when i called he started out verbal abusive and cursing about if Dr. Shea Evans sent in his _____ medication. 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 He continued to curse and repeat himself and at that point I tried to convince him to try and take some deep breaths and try to relax a very minutes.  Patient continued to be verbally abusive so I told him that I would call back when he has calmed down and that he was being to aggressive with his behavior.    After the call I contacted the [facility] department to do a well-check on patient.

## 2021-02-23 NOTE — Telephone Encounter (Signed)
after speaking with dr. Shea Evans we contacted the [facility] Department to see if they could do a wellness check on the other address we have listed for the patient.  police dept notified and we are waiting for call back.

## 2021-02-23 NOTE — Telephone Encounter (Signed)
pt called because when he called the pharmacy about medication he was told that the medication was put on hold until he contacted the doctor office. Pt was transferred to Dr. Shea Evans office   At 1st pt was in a calm voice but when dr Shea Evans started asking if anyone was staying with him so she can speak with them to make sure that they would be around him this weekend to make sure that he would be ok and have support he become upset and stated that he did not want anyone else involved in his business.  While dr. Shea Evans was on the phone with the patient the [place] police dept the officer states that no one would answer the door. Officer was told that he was on the phone with the doctor and I would have her tell him to answer the door. Dr. Shea Evans did ask pt to open the door for the police officer the officer did speak with patient and he appeared to be ok and in a ok mood. The officer did stated that if we had anymore concern to let them know and they would go and do another check.  The officer was able to get a emergency contact person for Dr. Shea Evans to speak with to make sure that patient is looked after during the weekend until he can be seen on Tuesday the 31st for a in-person appt.   After hanging up with the patient Dr. Shea Evans did contact the emergency contact.  I made appt for the patient for Tuesday 02-28-21 @  12:00 per Dr. Shea Evans request.

## 2021-02-23 NOTE — Telephone Encounter (Signed)
Since patient had hung up before I could complete evaluation and schedule a follow up appointment, attempted to contact him several times , no answer . I have sent staff at front desk a message to contact and schedule him for an appointment in office on May 31 st. I communicated with PHP program at [facility] to contact this patient for starting PHP. I communicated with Ms.Alver Fisher with [facility] to see if she could start seeing this patient for individual therapy and she has agreed to do so.  Will have Butch Penny at [facility] office contact to schedule this appointment for therapy as soon as possible.

## 2021-02-23 NOTE — Progress Notes (Addendum)
This note is not being shared with the patient for the following reason: To prevent harm (release of this note would result in harm to the life or physical safety of the patient or another).  Virtual Visit via Video Note  I connected with Johnny Villa on 02/23/21 at  3:00 PM EDT by a video enabled telemedicine application and verified that I am speaking with the correct person using two identifiers.  Location Provider Location : [facility] Patient Location : Johnny Villa (patient lied about his location at the beginning of the session, told Probation officer that he was in [place] and when asked for the address gave the below address [address] [place], [place] [id number] later on police was sent to this location and at that time was able to verify that he was not there and he was in [place] .  Participants: Patient , Provider    I discussed the limitations of evaluation and management by telemedicine and the availability of in person appointments. The patient expressed understanding and agreed to proceed.    I discussed the assessment and treatment plan with the patient. The patient was provided an opportunity to ask questions and all were answered. The patient agreed with the plan and demonstrated an understanding of the instructions.   The patient was advised to call back or seek an in-person evaluation if the symptoms worsen or if the condition fails to improve as anticipated.   [place] MD OP Progress Note  02/23/2021 4:36 PM Johnny Villa  MRN:  [id number]  Chief Complaint:  Chief Complaint    Follow-up; Anxiety; Agitation; Depression; Alcohol Problem     HPI: Johnny Villa is a 45 year old male, currently lives in in [place], disabled, has a history of PTSD, bipolar disorder, insomnia, alcohol use disorder, cocaine use disorder in remission was evaluated by telemedicine today.  Patient appeared to be agitated the entire session.  Writer was able to redirect patient on and  off however patient kept asking a question back to Probation officer for every question that Probation officer attempted to ask him during the evaluation.  Patient started the conversation by stating that he was tired of the medical community in its entirety since the whole medical system has failed him and that he was not getting any help.  Patient also reported that he had a bad experience at [facility] and [facility] had also failed him.  Patient asked writer what Probation officer was able to give him to help him to feel better.  However when patient kept getting agitated again and again stating that nobody was helping him Probation officer discussed with patient that Probation officer could refer him outside to other providers if he is unhappy with writer's care.  At that point patient reported that since writer seems to be a compassionate person patient would like Probation officer to continue his care and wants to know what writer could give him today.   When Probation officer discussed that his medications were recently readjusted patient started laughing stating that a small dose increase in Lamictal was not going to make any difference.  He knew this since he was an educated person and he has done a lot of research. At this writer also discussed that this dosage was recently increased 13 days ago and that we were waiting for this appointment and also for his urine drug screen as well as EKG inorder to start a new medication.  In the meantime writer had provided patient information for therapy resources since he was very motivated to start therapy  during his last session.  At this patient reported that the therapist was unable to give him an appointment until July 1 week.  At this writer discussed that patient was noncompliant with partial hospitalization program to which he was discharged from [facility] after his inpatient admission at the beginning of May.  Discussed that he also needs to be compliant with recommendations and patient started getting agitated again  at that reporting that he was indeed compliant with all recommendations. After several minutes of attempted redirection, patient calmed down for a brief period of time.  He reported that he was willing to start a medication .  Discussed Zyprexa.  Patient reported he did not want to start at the lower dosage however wants to start at a higher dosage possibly at the 10 mg.  Discussed with patient that I do not recommend doing so since we have to monitor for side effects and we can gradually increase the dose.  Discussed with patient that if he is too agitated and anxious then writer would recommend a partial hospitalization program or some kind of intensive program since medications will take time and it is possible that he can also develop adverse side effects.  At this patient reported that he did not want to pay a lot off money to get into programs like this and that since the whole medical system has failed him, his health insurance plan has also failed him since he has been paying huge amount of money since the past several years for his health care.  Discussed with patient that I will refer him to the program with [facility] and he could discuss insurance and other concerns with them.  Also discussed that if he is unable to afford that program the person who contacts him will give him other resources which may be more affordable.  Patient appeared to be labile at this and kept laughing and then became angry and irritable, Academic librarian at times , reporting that he does not believe that the [facility] coordinator who will contact him will give him more resources.   When patient calmed down in between patient denied suicidality.  Patient denied homicidality.  Patient reported that he has not been sleeping well.   He denied any relapse on alcohol or any other drug when writer questioned him due to his current mood changes and behavioral changes. Writer discussed with patient that another urine drug screen will  be ordered.  Patient became agitated again reporting that he did not want to repeat any urine drug screen.  When Probation officer asked patient about his pharmacy information, patient provided pharmacy address in [place].  When Probation officer questioned patient at this as to why he wanted Probation officer to send his medications to [facility] since he had told Probation officer that he was located in [place] at the beginning of the session and also reported during his last visit that he had moved back to [place] and at that visit was living at [employer] .  Patient at this reported that he was planning on going back to his house in [place] where he was staying with roommates.  At this writer discussed with patient what he had reported during his previous appointment when he had reported that he was in a toxic relationship and how the toxic relationship had caused decompensation and how he ended up relapsing on alcohol.  At this patient became agitated again reporting that he did not want to talk about his social life with Probation officer.  Patient hung up on writer at this.  Since the evaluation could not be completed and the patient could not be given an appointment for follow-up writer attempted to call patient back.  After several attempts patient did not pick up.  Writer communicated with Janett Billow CMA who tried contacting patient again.   Patient picked up the call at some point with CMA and asked her whether writer had sent his medication to the pharmacy and at this time was using profanity.  CMA attempted to calm patient down however he hung up on her.  Writer as well as CMA attempted to contact patient several times left messages for him to contact us back.  Since Probation officer was concerned about patient's safety, [facility] Department was contacted for a Comptroller.  However they contacted [place] back stating that the address that patient provided at the beginning of his session as his location was wrong since that home was sold 3  months ago.  Since this worsened our worry about patient and since he continued to not respond or engage [facility] Department was contacted.   Writer at this point contacted director Dr. Gigi Gin concern about this patient.  Writer after discussion with Dr. Adele Schilder contacted pharmacy and advised pharmacist to hold his medication-olanzapine that was sent out earlier advising that patient needs to contact the clinic prior to having the medication disposed to him.   When [facility] Police Department reached patient's home patient saw that they were outside his house and at that point picked up writer's call, since writer continued to reach out to patient.  Patient during that time appeared to be calm and asked Primary school teacher had blocked his medication at the pharmacy.  Patient also reported that he took a nap and now feels much better.  When Probation officer discussed that we had to call police , patient asked if police were there to arrest him.  Writer discussed that police were sent over since patient had hung up and was not engaging or responding to calls and since we were worried about him.  While patient stayed on the call with writer patient opened his garage and police were able to get inside and talk to him.  While police were there with him writer asked for a family member to contact, at this patient gave his girlfriend-Ms. Candace Coble's phone number.  As per police report patient was calm and they did not feel he needed immediate assistance.   Writer contacted girlfriend noted in chart-Ms. Candace Coble-since writer was worried about his safety-discussed what happened.  According to girlfriend patient is currently going through some situational stressors.  He had roommates who were living with him however they checked out.  Patient was supposed to renew his lease at this house where he stays now in [place].  However even though the landlord had told him that they will do a  property inspection and renew his lease, they changed their plan and said something like they may not do it since according to patient, per report from girlfriend, allegedly one of the roommates discussed with landlord that patient relapsed and also was admitted for suicidality to the hospital.  Hence landlord is currently hesitant to sign the lease.  Since its patient's birthday today and he is currently going through all the stressors he has been acting angry and mad since morning.  At some point he had contacted her reporting that he wanted to have dinner together however later on changed  his plan.  She reports even though they live separate due to her custody agreement with her ex, she is still in a relationship with the patient.  Patient had contacted her after he had hung up with Probation officer.  They are constantly in touch.  She cares about patient and reports that she will constantly be in contact with patient and make sure he gets help if he needs it.     Visit Diagnosis:    ICD-10-CM   1. PTSD (post-traumatic stress disorder)  F43.10   2. Bipolar 1 disorder, mixed, severe ([name])  F31.63 Urine drugs of abuse scrn w alc, routine (Ref Lab)    OLANZapine (ZYPREXA) 5 MG tablet    DISCONTINUED: OLANZapine (ZYPREXA) 5 MG tablet  3. Primary insomnia  F51.01   4. Bereavement  Z63.4   5. Alcohol use disorder, moderate, in early remission ([place])  F10.21   6. Cocaine use disorder, moderate, in sustained remission ([place])  F14.21     Past Psychiatric History: I have reviewed past psychiatric history from progress note on 05/04/2019.  Past trials of Klonopin, Lexapro Rexulti, Wellbutrin, Lamictal, lithium, BuSpar, Elavil,vraylar  Past Medical History:  Past Medical History:  Diagnosis Date  . Bipolar disorder [facility])    psychiatrist in [place]  . Blood in stool   . Depression   . Drug abuse in remission [facility])    sober for 6 years but relapse 01/18/21 [facility] on ventilator for etoh/thc intoxication found with  loc  . Fatty liver 01/2018  . H/O alcohol abuse   . Headache    migraines  . Hypertension   . Low testosterone in male   . Multifocal pneumonia    01/18/21 ? aspiration after thc and alcohol intoxication  . PTSD (post-traumatic stress disorder)     Past Surgical History:  Procedure Laterality Date  . APPENDECTOMY     2006  . COLONOSCOPY WITH PROPOFOL N/A 12/30/2017   Procedure: COLONOSCOPY WITH PROPOFOL;  Surgeon: Lin Landsman, MD;  Location: [facility] ENDOSCOPY;  Service: Gastroenterology;  Laterality: N/A;  . ESOPHAGOGASTRODUODENOSCOPY (EGD) WITH PROPOFOL N/A 12/30/2017   Procedure: ESOPHAGOGASTRODUODENOSCOPY (EGD) WITH PROPOFOL;  Surgeon: Lin Landsman, MD;  Location[facility] ENDOSCOPY;  Service: Gastroenterology;  Laterality: N/A;    Family Psychiatric History: I have reviewed family psychiatric history from progress note on 05/04/2019  Family History:  Family History  Problem Relation Age of Onset  . Depression Mother   . Alcohol abuse Father   . Cancer Father        prostate dx'ed 45  . COPD Father   . Hyperlipidemia Father   . Hypertension Father     Social History: I have reviewed social history from progress note on 05/04/2019 Social History   Socioeconomic History  . Marital status: Single    Spouse name: Not on file  . Number of children: Not on file  . Years of education: Not on file  . Highest education level: Not on file  Occupational History  . Occupation: disabled  Tobacco Use  . Smoking status: Former Smoker    Packs/day: 0.25    Years: 2.00    Pack years: 0.50    Types: Cigarettes    Quit date: 1999    Years since quitting: 23.4  . Smokeless tobacco: Former Systems developer    Types: Secondary school teacher  . Vaping Use: Former  Substance and Sexual Activity  . Alcohol use: Not Currently    Comment: former quit 03/14/2012  .  Drug use: Not Currently    Comment: former quit cocaine 03/14/2012   . Sexual activity: Yes  Other Topics Concern  . 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    Social Determinants of Health   Financial Resource Strain: Not on file  Food Insecurity: Not on file  Transportation Needs: Not on file  Physical Activity: Not on file  Stress: Not on file  Social Connections: Not on file    Allergies: No Known Allergies  Metabolic Disorder Labs: Lab Results  Component Value Date   HGBA1C 5.6 02/26/2020   MPG 114.02 09/13/2019   No results found for: PROLACTIN Lab Results  Component Value Date   CHOL 175 08/08/2020   TRIG 44 08/08/2020   HDL 53 08/08/2020   CHOLHDL 3.3 08/08/2020   VLDL 11.6 02/26/2020   LDLCALC 113 (H) 08/08/2020   LDLCALC 158 (H) 02/26/2020   Lab Results  Component Value Date   TSH 1.520 08/08/2020   TSH 1.76 08/10/2019    Therapeutic Level Labs: Lab Results  Component Value Date   LITHIUM 0.4 (L) 06/23/2018   LITHIUM 0.8 10/25/2017   No results found for: VALPROATE No components found for:  CBMZ  Current Medications: Current Outpatient Medications  Medication Sig Dispense Refill  . ACIDOPHILUS LACTOBACILLUS PO Take 1 capsule by mouth daily.    Marland Kitchen amLODipine (NORVASC) 10 MG tablet Take 1 tablet (10 mg total) by mouth daily. 30 tablet 11  . busPIRone (BUSPAR) 30 MG tablet Take 1 tablet (30 mg total) by mouth in the morning and at bedtime. 180 tablet 1  . clomiPHENE (CLOMID) 50 MG tablet Take 25 mg by mouth daily.    . folic acid (FOLVITE) 563 MCG tablet Take by mouth.    . gabapentin (NEURONTIN) 600 MG tablet Take 1 tablet (600 mg total) by mouth 3 (three) times daily. 90 tablet 5  . lamoTRIgine (LAMICTAL) 100 MG tablet Take 125 mg by mouth daily.    Marland Kitchen lamoTRIgine (LAMICTAL) 200 MG tablet Take 200 mg by mouth daily. (Patient not taking: Reported on 02/14/2021)    . lamoTRIgine (LAMICTAL) 25 MG tablet Take 1 tablet (25 mg total) by mouth daily. Take  along with 100 mg daily 30 tablet 1  . Multiple Vitamin (MULTI-VITAMIN) tablet Take 1 tablet by mouth daily.    . mupirocin ointment (BACTROBAN) 2 % Apply 1 application topically 2 (two) times daily. 30 g 0  . Naproxen Sodium 220 MG CAPS Take by mouth.    . OLANZapine (ZYPREXA) 5 MG tablet Take 1 tablet (5 mg total) by mouth at bedtime. 5 tablet 0  . ondansetron (ZOFRAN-ODT) 4 MG disintegrating tablet Take 2 tablets (8 mg total) by mouth every 8 (eight) hours as needed. 90 tablet 1  . pantoprazole (PROTONIX) 40 MG tablet Take 1 tablet (40 mg total) by mouth daily. 90 tablet 2  . sildenafil (REVATIO) 20 MG tablet Take 2 tablets (40 mg total) by mouth daily in the afternoon. 60 tablet 11  . SUMAtriptan (IMITREX) 100 MG tablet Take 1 tablet (100 mg total) by mouth every 2 (two) hours as needed. Max dose 200 mg/24 hours. Limit to 2x per week 9 tablet 11  . zolpidem (AMBIEN) 10 MG tablet TAKE ONE TABLET BY MOUTH EVERY NIGHT AT  BEDTIME AS NEEDED FOR SLEEP NOTE: STOP TAKING AMITRIPTYLINE 30 tablet 0   No current facility-administered medications for this visit.     Musculoskeletal: Strength & Muscle Tone: UTA Gait & Station: UTA Patient leans: N/A  Psychiatric Specialty Exam: Review of Systems  Unable to perform ROS: Psychiatric disorder    There were no vitals taken for this visit.There is no height or weight on file to calculate BMI.  General Appearance: Guarded  Eye Contact:  Minimal  Speech:  Normal Rate  Volume:  Increased  Mood:  Angry, Anxious and Irritable  Affect:  Labile  Thought Process:  Goal Directed and Descriptions of Associations: Circumstantial  Orientation:  Full (Time, Place, and Person)  Thought Content: Rumination   Suicidal Thoughts:  No  Homicidal Thoughts:  No  Memory:  Immediate;   Fair Recent;   Fair Remote;   Fair  Judgement:  Impaired  Insight:  Shallow  Psychomotor Activity:  Increased  Concentration:  Concentration: Fair and Attention Span: Fair   Recall:  [employer] of Knowledge: Fair  Language: Fair  Akathisia:  No  Handed:  Right  AIMS (if indicated):UTA  Assets:  Desire for Improvement Housing Intimacy Social Support  ADL's:  Intact  Cognition: WNL  Sleep:  Poor   Screenings: AIMS   Flowsheet Row Admission (Discharged) from 09/09/2019 in [facility] Total Score 0    AUDIT   Flowsheet Row Admission (Discharged) from 09/09/2019 in [facility] Admission (Discharged) from 06/01/2019 in [facility]  Alcohol Use Disorder Identification Test Final Score (AUDIT) 40 33    GAD-7   Flowsheet Row Video Visit from 02/02/2020 in [facility]  Total GAD-7 Score 0    PHQ2-9   [place] Office Visit from 02/14/2021 in [facility] Video Visit from 11/17/2020 in [facility] Counselor from 08/08/2020 in [facility] Video Visit from 02/02/2020 in [facility] Office Visit from 07/09/2019 in [facility]  PHQ-2 Total Score 0 1 0 0 2  PHQ-9 Total Score -- -- 0 -- 4    Flowsheet Row Video Visit from 02/23/2021 in [place] Video Visit from 11/17/2020 in [facility] Admission (Discharged) from 09/09/2019 in [facility] Low Risk No Risk High Risk       Assessment and Plan: Calan Doren is a 45 year old divorced, lives in [place], has a history of PTSD, bipolar disorder, disorder with recent relapse, currently in early remission, cocaine use disorder in remission, migraine headaches was evaluated by telemedicine today. The patient demonstrates the following risk factors for suicide: Chronic risk factors for suicide include: psychiatric disorder of PTSD, Bipolar disorder, substance use disorder, previous  suicide attempts x2, recent questionable unintentional- OD on medications and chronic pain. Acute risk factors for suicide include: family or marital conflict, loss (financial, interpersonal, professional) and recent discharge from inpatient psychiatry. Protective factors for this patient include: positive social support and was redirectable and appeared to be calm at our last conversation, girl friend reports problem with housing this AM however he had plans and appeared to be future oriented , patient agreed to start PHP , one on one therapy and also requested medication changes , girl friend agrees to support and get help, patient was calm with police who conducted welfare check. Considering these factors, the overall suicide risk at this  point appears to be low. Patient is appropriate for outpatient follow up.  Violence risk assessment-acute risk factors-patient with history of recent relapse on alcohol although currently in early remission, history of cocaine abuse, mental health problems, currently appeared to be agitated and irritable and angry although redirectable, does report a history of legal problems although at that time he reported that the charges were going to be dismissed.  Protective factors-denied homicidal thoughts, did not mention access to a firearm, denies any current legal problems, reports he is currently in early remission from alcoholism and has been in [employer], has a sponsor, has good social support system, appeared to be redirectable.  Based on above factors- acute violence risk is low.  Plan PTSD-unstable I have communicated with Ms. Lorin Glass with partial hospitalization program to contact this patient urgently. I have contacted with Ms. Paulla Dolly therapist who has agreed to take this patient for one-on-one therapy. Discussed with patient he needs to be compliant with recommendations, he needs to be compliant with both partial hospitalization and  one-on-one therapy. Patient does have a history of being noncompliant with recommendations for PHP in the past when he was discharged from [facility] recently. Continue BuSpar 30 mg p.o. twice daily Continue Lamictal 125 mg p.o. daily. Start olanzapine 5 mg p.o. nightly.  I contacted the pharmacy again and advised them to give only 5 pills to last until his next visit which is on May 31-in office visit.  This was also communicated with patient.  Bipolar disorder-unstable Zyprexa 5 mg p.o. nightly Lamictal 125 mg p.o. daily  Bereavement-unstable Patient will benefit from counseling   Primary insomnia-unstable Discussed with patient to hold the Ambien since the olanzapine is being added.  This can make him too groggy together. Continue olanzapine as prescribed  Alcohol use disorder moderate in early remission- patient will continue to benefit from [place] meetings Will order a urine drug screen due to recent changes in patient's behavior and agitation.  Cocaine use disorder in remission Will monitor closely  I have reviewed urine drug screen dated 02/09/2021- present for gabapentin, lamotrigine, zolpidem, diphenhydramine, acetaminophen and lidocaine I have reviewed EKG dated 02/09/2021- within normal limits.   I have communicated with girlfriend-Ms. Coble since patient was not engaging and had provided false information about his location at our telemedicine appointment.  Due to safety concerns as noted above police was contacted however we were able to locate patient in [place] at his new residence after several hours.  Patient was able to calm down as noted above.  Patient agreed to engage in treatment, therapy and was motivated to start medication.  I will recommend in office visit for this patient due to patient providing false information about his location and being very inconsistent with information provided.  Follow-up in clinic in office on May 31 at 12 PM.  I have spent  atleast 40 minutes face to face with patient today which includes the time spent for preparing to see the patient ( e.g., review of test, records ), obtaining and to review and separately obtained history , ordering medications and test ,psychoeducation and supportive psychotherapy and care coordination,as well as documenting clinical information in electronic health record, interpreting test results  This note was generated in part or whole with voice recognition software. Voice recognition is usually quite accurate but there are transcription errors that can and very often do occur. I apologize for any typographical errors that were not detected and corrected.  Ursula Alert, MD 02/24/2021, 7:15 PM

## 2021-02-24 ENCOUNTER — Telehealth (HOSPITAL_COMMUNITY): Payer: Self-pay | Admitting: Licensed Clinical Social Worker

## 2021-02-24 DIAGNOSIS — F5101 Primary insomnia: Secondary | ICD-10-CM | POA: Insufficient documentation

## 2021-02-25 ENCOUNTER — Encounter: Payer: Self-pay | Admitting: Internal Medicine

## 2021-02-28 ENCOUNTER — Telehealth: Payer: Self-pay

## 2021-02-28 ENCOUNTER — Telehealth (HOSPITAL_COMMUNITY): Payer: Self-pay | Admitting: Licensed Clinical Social Worker

## 2021-02-28 ENCOUNTER — Ambulatory Visit: Payer: Medicare Other | Admitting: Psychiatry

## 2021-02-28 DIAGNOSIS — F3163 Bipolar disorder, current episode mixed, severe, without psychotic features: Secondary | ICD-10-CM

## 2021-02-28 MED ORDER — OLANZAPINE 5 MG PO TABS: 5.0000 mg | ORAL_TABLET | Freq: Every day | ORAL | 0 refills | Status: DC

## 2021-02-28 NOTE — Telephone Encounter (Signed)
spoke with patient advised pt that dr. Shea Evans would not do a virtual appt with him that he needed to be inperson and since he was sick she r/s appt to Thursday @ 12:00. Pt was also advised that he needed to contact PHP program and get that set up that if he does not comply to the treatment plain then he will be dismissed. Pt was given the number to call to set up the PHP and told him to contact when he gets off the phone with me.

## 2021-02-28 NOTE — Telephone Encounter (Signed)
pt called and stated that he was sick and that he would not be able to come into office today and he could do a virtual visit. pt I told him that I would let doctor eappen know.

## 2021-02-28 NOTE — Telephone Encounter (Signed)
I will send a limited supply of olanzapine to last until his in office visit on Thursday to his [facility] [place].

## 2021-02-28 NOTE — Telephone Encounter (Signed)
Please advise   Does Patient need an appointment or need to discuss with Dr Shea Evans?

## 2021-02-28 NOTE — Telephone Encounter (Signed)
Cln called pt back to f/u on gathered resources in his area. Cln shared information for [facility] for free grief counseling support through the Hospice in [place] and surrounding counties. Cln shared information on 2 mental health support groups through [facility], a general [place] support group and a stress and anxiety support group that are being offered free and virtually. Pt expresses interest in all three options. Cln sent email with the links to support groups to patient after verifying the email in his chart as accurate.  Pt also shares that he previously utilized support from the depression and bipolar support alliance and had gone to a support group between cln's calls this afternoon. Pt states he is very willing to continue to utilize their services for support.  Cln informed pt that cln updated Dr Shea Evans regarding their conversations and that since PHP is not happening, someone will reach out soon to schedule him with individual therapy. Pt states he actually just got off the phone with the office and he has an upcoming appointment with Valeria Batman for individual therapy.  Pt continues to present in a calm and engaged manner and thanks clinician for the options. Pt states he will attend his appointment with Dr Shea Evans on Thursday and denies any SI/HI currently.

## 2021-02-28 NOTE — Telephone Encounter (Signed)
Cln spoke with pt via telephone re: PHP referral. Pt states he is aware of the referral. Pt states his insurance will not cover PHP and he has spoken to his insurance to confirm that. Pt states he has informed Dr Shea Evans he does not have PHP coverage. Pt requests alternative options. Cln informed pt that if unable to engage in intensive treatment groups, individual therapy and support groups are a suitable replacement. Pt states he is open to these options. Cln informed pt she will need time to determine support group options in the [place] area and asked pt what specific support would be most desirable. Pt states he wants help most on grief and trauma/anxiety. Pt states he is part of a 12-step program and feels well supported in that area. Pt states he is open to virtual and in-person treatment. Pt states he feels safe in his current environment and denies SI/HI. Cln reports she will research support group options and call pt back with the information. Pt gives permission to leave information on his voicemail if he is unavailable. Cln will inform Dr Shea Evans of call.

## 2021-02-28 NOTE — Telephone Encounter (Signed)
dr. Shea Evans stated that she will not see this patient as virtural that he needed to come in .  to have pt come back on Thursday at 12:00 and also need to let him know that he needed to contact PHP to set that up. and if he was not being complaint with his PHP and coming into office for in person visit he will be dismissed.

## 2021-02-28 NOTE — Telephone Encounter (Signed)
Pls let him know we have referred him for therapy and PHP but also he has an in office visit today with me.

## 2021-02-28 NOTE — Telephone Encounter (Signed)
pt called left a message that his roomates move out because of his condition. his PTSD is worse

## 2021-03-01 ENCOUNTER — Ambulatory Visit (INDEPENDENT_AMBULATORY_CARE_PROVIDER_SITE_OTHER): Payer: Medicare Other

## 2021-03-01 VITALS — Ht 71.0 in | Wt 219.0 lb

## 2021-03-01 DIAGNOSIS — Z0000 Encounter for general adult medical examination without abnormal findings: Secondary | ICD-10-CM

## 2021-03-01 NOTE — Progress Notes (Signed)
Subjective:   Johnny Villa is a 45 y.o. male who presents for Medicare Annual/Subsequent preventive examination.  Review of Systems    No ROS.  Medicare Wellness Virtual Visit.  Visual/audio telehealth visit, UTA vital signs.   See social history for additional risk factors.  Cardiac Risk Factors include: male gender;hypertension     Objective:    Today's Vitals   03/01/21 1447  Weight: 219 lb (99.3 kg)  Height: 5' 11"  (1.803 m)   Body mass index is 30.54 kg/m.  Advanced Directives 03/01/2021 02/14/2021 01/11/2020 09/08/2019 06/26/2019 05/30/2019 08/30/2018  Does Patient Have a Medical Advance Directive? Yes Yes Yes No Yes Yes No  Type of Paramedic of [place];Living will - - [place];Living will [employer];Living will -  Does patient want to make changes to medical advance directive? No - Patient declined - - - No - Patient declined - -  Copy of [place] in Chart? No - copy requested - - - No - copy requested - -  Would patient like information on creating a medical advance directive? - - - No - Patient declined - - -  Some encounter information is confidential and restricted. Go to Review Flowsheets activity to see all data.    Current Medications (verified) Outpatient Encounter Medications as of 03/01/2021  Medication Sig  . ACIDOPHILUS LACTOBACILLUS PO Take 1 capsule by mouth daily.  Marland Kitchen amLODipine (NORVASC) 10 MG tablet Take 1 tablet (10 mg total) by mouth daily.  . busPIRone (BUSPAR) 30 MG tablet Take 1 tablet (30 mg total) by mouth in the morning and at bedtime.  . clomiPHENE (CLOMID) 50 MG tablet Take 25 mg by mouth daily.  . folic acid (FOLVITE) 505 MCG tablet Take by mouth.  . gabapentin (NEURONTIN) 600 MG tablet Take 1 tablet (600 mg total) by mouth 3 (three) times daily.  Marland Kitchen lamoTRIgine (LAMICTAL) 100 MG tablet Take 125 mg by mouth daily.  Marland Kitchen lamoTRIgine  (LAMICTAL) 25 MG tablet Take 1 tablet (25 mg total) by mouth daily. Take along with 100 mg daily  . Multiple Vitamin (MULTI-VITAMIN) tablet Take 1 tablet by mouth daily.  . mupirocin ointment (BACTROBAN) 2 % Apply 1 application topically 2 (two) times daily.  . Naproxen Sodium 220 MG CAPS Take by mouth.  . OLANZapine (ZYPREXA) 5 MG tablet Take 1 tablet (5 mg total) by mouth at bedtime.  . ondansetron (ZOFRAN-ODT) 4 MG disintegrating tablet Take 2 tablets (8 mg total) by mouth every 8 (eight) hours as needed.  . pantoprazole (PROTONIX) 40 MG tablet Take 1 tablet (40 mg total) by mouth daily.  . sildenafil (REVATIO) 20 MG tablet Take 2 tablets (40 mg total) by mouth daily in the afternoon.  . SUMAtriptan (IMITREX) 100 MG tablet Take 1 tablet (100 mg total) by mouth every 2 (two) hours as needed. Max dose 200 mg/24 hours. Limit to 2x per week  . zolpidem (AMBIEN) 10 MG tablet TAKE ONE TABLET BY MOUTH EVERY NIGHT AT BEDTIME AS NEEDED FOR SLEEP -NOTE: STOP TAKING AMITRIPTYLINE   No facility-administered encounter medications on file as of 03/01/2021.    Allergies (verified) Patient has no known allergies.   History: Past Medical History:  Diagnosis Date  . Bipolar disorder [facility])    psychiatrist in [place]  . Blood in stool   . Depression   . Drug abuse in remission [facility])    sober for 6 years but  relapse 01/18/21 [facility] on ventilator for etoh/thc intoxication found with loc  . Fatty liver 01/2018  . H/O alcohol abuse   . Headache    migraines  . Hypertension   . Low testosterone in male   . Multifocal pneumonia    01/18/21 ? aspiration after thc and alcohol intoxication  . PTSD (post-traumatic stress disorder)    Past Surgical History:  Procedure Laterality Date  . APPENDECTOMY     2006  . COLONOSCOPY WITH PROPOFOL N/A 12/30/2017   Procedure: COLONOSCOPY WITH PROPOFOL;  Surgeon: Lin Landsman, MD;  Location: [facility] ENDOSCOPY;  Service: Gastroenterology;  Laterality: N/A;  .  ESOPHAGOGASTRODUODENOSCOPY (EGD) WITH PROPOFOL N/A 12/30/2017   Procedure: ESOPHAGOGASTRODUODENOSCOPY (EGD) WITH PROPOFOL;  Surgeon: Lin Landsman, MD;  Location: [facility] ENDOSCOPY;  Service: Gastroenterology;  Laterality: N/A;   Family History  Problem Relation Age of Onset  . Depression Mother   . Alcohol abuse Father   . Cancer Father        prostate dx'ed 30  . COPD Father   . Hyperlipidemia Father   . Hypertension Father    Social History   Socioeconomic History  . Marital status: Single    Spouse name: Not on file  . Number of children: Not on file  . Years of education: Not on file  . Highest education level: Not on file  Occupational History  . Occupation: disabled  Tobacco Use  . Smoking status: Former Smoker    Packs/day: 0.25    Years: 2.00    Pack years: 0.50    Types: Cigarettes    Quit date: 1999    Years since quitting: 23.4  . Smokeless tobacco: Former Systems developer    Types: Secondary school teacher  . Vaping Use: Former  Substance and Sexual Activity  . Alcohol use: Not Currently    Comment: former quit 03/14/2012  . Drug use: Not Currently    Comment: former quit cocaine 03/14/2012   . Sexual activity: Yes  Other Topics Concern  . 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 Difficulty of Paying Living Expenses: Not hard at all  Food Insecurity: No Food Insecurity  . Worried About Charity fundraiser in the Last Year: Never true  . Ran Out of Food in the Last Year: Never true  Transportation Needs: No Transportation Needs  . Lack of Transportation (Medical): No  . Lack of Transportation (Non-Medical): No  Physical Activity: Not on file  Stress: Not on file  Social Connections: Unknown  . Frequency of Communication with Friends and Family: Patient refused  .  Frequency of Social Gatherings with Friends and Family: Patient refused  . Attends Religious Services: Patient refused  . Active Member of Clubs or Organizations: Patient refused  . Attends Archivist Meetings: Patient refused  . Marital Status: Patient refused    Tobacco Counseling Counseling given: Not Answered   Clinical Intake:  Pre-visit preparation completed: Yes        Diabetes: No  How often do you need to have someone help you when you read instructions, pamphlets, or other written materials from your doctor or pharmacy?: 1 - Never    Interpreter Needed?:  No    Activities of Daily Living In your present state of health, do you have any difficulty performing the following activities: 03/01/2021  Hearing? N  Vision? N  Difficulty concentrating or making decisions? N  Walking or climbing stairs? N  Dressing or bathing? N  Doing errands, shopping? N  Preparing Food and eating ? N  Using the Toilet? N  In the past six months, have you accidently leaked urine? N  Do you have problems with loss of bowel control? N  Managing your Medications? N  Managing your Finances? N  Housekeeping or managing your Housekeeping? N  Some recent data might be hidden    Patient Care Team: McLean-Scocuzza, Nino Glow, MD as PCP - General (Internal Medicine)  Indicate any recent Medical Services you may have received from other than [facility] providers in the past year (date may be approximate).     Assessment:   This is a routine wellness examination for [facility].  I connected with Demonie today by telephone and verified that I am speaking with the correct person using two identifiers. Location patient: home Location provider: work Persons participating in the virtual visit: patient, Marine scientist.    I discussed the limitations, risks, security and privacy concerns of performing an evaluation and management service by telephone and the availability of in person appointments. The  patient expressed understanding and verbally consented to this telephonic visit.    Interactive audio and video telecommunications were attempted between this provider and patient, however failed, due to patient having technical difficulties OR patient did not have access to video capability.  We continued and completed visit with audio only.  Some vital signs may be absent or patient reported.   Hearing/Vision screen  Hearing Screening   125Hz  250Hz  500Hz  1000Hz  2000Hz  3000Hz  4000Hz  6000Hz  8000Hz   Right ear:           Left ear:           Comments: Patient is able to hear conversational tones without difficulty.  No issues reported.  Vision Screening Comments: Visual acuity not assessed, virtual visit.     Dietary issues and exercise activities discussed: Current Exercise Habits: Home exercise routine, Type of exercise: walking;calisthenics;strength training/weights, Intensity: Mild  Regular diet  Goals Addressed            This Visit's Progress   . Follow up with Primary Care Provider       As needed       Depression Screen PHQ 2/9 Scores 03/01/2021 02/14/2021 02/02/2020 06/26/2019 06/24/2018 06/19/2018 05/19/2018  PHQ - 2 Score 0 0 0 0 0 0 0  Exception Documentation - - - - - - -  Some encounter information is confidential and restricted. Go to Review Flowsheets activity to see all data.    Fall Risk Fall Risk  03/01/2021 02/14/2021 02/09/2021 08/05/2020 02/02/2020  Falls in the past year? 0 0 0 0 0  Number falls in past yr: 0 - 0 0 0  Injury with Fall? 0 - 0 0 0  Follow up Falls evaluation completed - Falls evaluation completed Falls evaluation completed Falls evaluation completed    [place]: Handrails in use when climbing stairs? Yes Home free of loose throw rugs in walkways, pet beds, electrical cords, etc? Yes  Adequate lighting in your home to reduce risk of falls? Yes   ASSISTIVE DEVICES UTILIZED TO PREVENT FALLS: Use of a cane, walker  or w/c? No   TIMED UP  AND GO: Was the test performed? No .   Cognitive Function:   Patient is alert and oriented x3.  MMSE/6CIT deferred per patient preference. Normal by direct communication/observation.    6CIT Screen 06/26/2019  What Year? 0 points  What month? 0 points  What time? 0 points  Count back from 20 0 points  Months in reverse 0 points  Repeat phrase 0 points  Total Score 0    Immunizations Immunization History  Administered Date(s) Administered  . Hep A / Hep B 07/19/2016  . Hepatitis A, Adult 12/16/2015  . Hepatitis B, adult 01/16/2016, 02/22/2016  . Influenza,inj,Quad PF,6+ Mos 08/10/2017, 06/19/2018, 07/09/2019, 08/05/2020  . Influenza-Unspecified 08/10/2017, 07/09/2019  . MMR 12/16/2015, 01/16/2016  . PFIZER Comirnaty(Gray Top)Covid-19 Tri-Sucrose Vaccine 02/04/2021  . PFIZER(Purple Top)SARS-COV-2 Vaccination 01/07/2020, 02/01/2020, 09/02/2020  . PPD Test 12/02/2015  . Tdap 12/02/2015    Health Maintenance There are no preventive care reminders to display for this patient. Health Maintenance  Topic Date Due  . INFLUENZA VACCINE  05/01/2021  . TETANUS/TDAP  12/01/2025  . Zoster Vaccines- Shingrix (1 of 2) 02/24/2028  . COVID-19 Vaccine  Completed  . Hepatitis C Screening  Completed  . HIV Screening  Completed  . HPV VACCINES  Aged Out   Dental Screening: Recommended annual dental exams for proper oral hygiene.  Community Resource Referral / Chronic Care Management: CRR required this visit?  No   CCM required this visit?  No      Plan:   Keep all routine maintenance appointments.   I have personally reviewed and noted the following in the patient's chart:   . Medical and social history . Use of alcohol, tobacco or illicit drugs  . Current medications and supplements including opioid prescriptions. Patient is not currently taking opioid prescriptions. . Functional ability and status . Nutritional status . Physical activity . Advanced  directives . List of other physicians . Hospitalizations, surgeries, and ER visits in previous 12 months . Vitals . Screenings to include cognitive, depression, and falls . Referrals and appointments  In addition, I have reviewed and discussed with patient certain preventive protocols, quality metrics, and best practice recommendations. A written personalized care plan for preventive services as well as general preventive health recommendations were provided to patient via mychart.     Varney Biles, LPN   0/11/1592

## 2021-03-01 NOTE — Patient Instructions (Addendum)
Johnny Villa , Thank you for taking time to come for your Medicare Wellness Visit. I appreciate your ongoing commitment to your health goals. Please review the following plan we discussed and let me know if I can assist you in the future.   These are the goals we discussed: Goals    . Follow up with Primary Care Provider     As needed        This is a list of the screening recommended for you and due dates:  Health Maintenance  Topic Date Due  . Flu Shot  05/01/2021  . Tetanus Vaccine  12/01/2025  . Zoster (Shingles) Vaccine (1 of 2) 02/24/2028  . COVID-19 Vaccine  Completed  . Hepatitis C Screening: USPSTF Recommendation to screen - Ages 29-79 yo.  Completed  . HIV Screening  Completed  . HPV Vaccine  Aged Out   Advanced directives: End of life planning; Advance aging; Advanced directives discussed.  Copy of current HCPOA/Living Will requested.    Conditions/risks identified: none new.  Follow up in one year for your annual wellness visit.  Preventive Care 40-64 Years, Male Preventive care refers to lifestyle choices and visits with your health care provider that can promote health and wellness. What does preventive care include?  A yearly physical exam. This is also called an annual well check.  Dental exams once or twice a year.  Routine eye exams. Ask your health care provider how often you should have your eyes checked.  Personal lifestyle choices, including:  Daily care of your teeth and gums.  Regular physical activity.  Eating a healthy diet.  Avoiding tobacco and drug use.  Limiting alcohol use.  Practicing safe sex.  Taking low-dose aspirin every day starting at age 46. What happens during an annual well check? The services and screenings done by your health care provider during your annual well check will depend on your age, overall health, lifestyle risk factors, and family history of disease. Counseling  Your health care provider may ask you  questions about your:  Alcohol use.  Tobacco use.  Drug use.  Emotional well-being.  Home and relationship well-being.  Sexual activity.  Eating habits.  Work and work Statistician. Screening  You may have the following tests or measurements:  Height, weight, and BMI.  Blood pressure.  Lipid and cholesterol levels. These may be checked every 5 years, or more frequently if you are over 61 years old.  Skin check.  Lung cancer screening. You may have this screening every year starting at age 56 if you have a 30-pack-year history of smoking and currently smoke or have quit within the past 15 years.  Fecal occult blood test (FOBT) of the stool. You may have this test every year starting at age 72.  Flexible sigmoidoscopy or colonoscopy. You may have a sigmoidoscopy every 5 years or a colonoscopy every 10 years starting at age 88.  Prostate cancer screening. Recommendations will vary depending on your family history and other risks.  Hepatitis C blood test.  Hepatitis B blood test.  Sexually transmitted disease (STD) testing.  Diabetes screening. This is done by checking your blood sugar (glucose) after you have not eaten for a while (fasting). You may have this done every 1-3 years. Discuss your test results, treatment options, and if necessary, the need for more tests with your health care provider. Vaccines  Your health care provider may recommend certain vaccines, such as:  Influenza vaccine. This is recommended every year.  Tetanus, diphtheria, and acellular pertussis (Tdap, Td) vaccine. You may need a Td booster every 10 years.  Zoster vaccine. You may need this after age 36.  Pneumococcal 13-valent conjugate (PCV13) vaccine. You may need this if you have certain conditions and have not been vaccinated.  Pneumococcal polysaccharide (PPSV23) vaccine. You may need one or two doses if you smoke cigarettes or if you have certain conditions. Talk to your health care  provider about which screenings and vaccines you need and how often you need them. This information is not intended to replace advice given to you by your health care provider. Make sure you discuss any questions you have with your health care provider. Document Released: 10/14/2015 Document Revised: 06/06/2016 Document Reviewed: 07/19/2015 Elsevier Interactive Patient Education  2017 [place] Prevention in the Home Falls can cause injuries. They can happen to people of all ages. There are many things you can do to make your home safe and to help prevent falls. What can I do on the outside of my home?  Regularly fix the edges of walkways and driveways and fix any cracks.  Remove anything that might make you trip as you walk through a door, such as a raised step or threshold.  Trim any bushes or trees on the path to your home.  Use bright outdoor lighting.  Clear any walking paths of anything that might make someone trip, such as rocks or tools.  Regularly check to see if handrails are loose or broken. Make sure that both sides of any steps have handrails.  Any raised decks and porches should have guardrails on the edges.  Have any leaves, snow, or ice cleared regularly.  Use sand or salt on walking paths during winter.  Clean up any spills in your garage right away. This includes oil or grease spills. What can I do in the bathroom?  Use night lights.  Install grab bars by the toilet and in the tub and shower. Do not use towel bars as grab bars.  Use non-skid mats or decals in the tub or shower.  If you need to sit down in the shower, use a plastic, non-slip stool.  Keep the floor dry. Clean up any water that spills on the floor as soon as it happens.  Remove soap buildup in the tub or shower regularly.  Attach bath mats securely with double-sided non-slip rug tape.  Do not have throw rugs and other things on the floor that can make you trip. What can I do in  the bedroom?  Use night lights.  Make sure that you have a light by your bed that is easy to reach.  Do not use any sheets or blankets that are too big for your bed. They should not hang down onto the floor.  Have a firm chair that has side arms. You can use this for support while you get dressed.  Do not have throw rugs and other things on the floor that can make you trip. What can I do in the kitchen?  Clean up any spills right away.  Avoid walking on wet floors.  Keep items that you use a lot in easy-to-reach places.  If you need to reach something above you, use a strong step stool that has a grab bar.  Keep electrical cords out of the way.  Do not use floor polish or wax that makes floors slippery. If you must use wax, use non-skid floor wax.  Do not have  throw rugs and other things on the floor that can make you trip. What can I do with my stairs?  Do not leave any items on the stairs.  Make sure that there are handrails on both sides of the stairs and use them. Fix handrails that are broken or loose. Make sure that handrails are as long as the stairways.  Check any carpeting to make sure that it is firmly attached to the stairs. Fix any carpet that is loose or worn.  Avoid having throw rugs at the top or bottom of the stairs. If you do have throw rugs, attach them to the floor with carpet tape.  Make sure that you have a light switch at the top of the stairs and the bottom of the stairs. If you do not have them, ask someone to add them for you. What else can I do to help prevent falls?  Wear shoes that:  Do not have high heels.  Have rubber bottoms.  Are comfortable and fit you well.  Are closed at the toe. Do not wear sandals.  If you use a stepladder:  Make sure that it is fully opened. Do not climb a closed stepladder.  Make sure that both sides of the stepladder are locked into place.  Ask someone to hold it for you, if possible.  Clearly mark and  make sure that you can see:  Any grab bars or handrails.  First and last steps.  Where the edge of each step is.  Use tools that help you move around (mobility aids) if they are needed. These include:  Canes.  Walkers.  Scooters.  Crutches.  Turn on the lights when you go into a dark area. Replace any light bulbs as soon as they burn out.  Set up your furniture so you have a clear path. Avoid moving your furniture around.  If any of your floors are uneven, fix them.  If there are any pets around you, be aware of where they are.  Review your medicines with your doctor. Some medicines can make you feel dizzy. This can increase your chance of falling. Ask your doctor what other things that you can do to help prevent falls. This information is not intended to replace advice given to you by your health care provider. Make sure you discuss any questions you have with your health care provider. Document Released: 07/14/2009 Document Revised: 02/23/2016 Document Reviewed: 10/22/2014 Elsevier Interactive Patient Education  2017 [employer] Managing Pain Without Opioids Opioids are strong medicines used to treat moderate to severe pain. For some people, especially those who have long-term (chronic) pain, opioids may not be the best choice for pain management due to:  Side effects like nausea, constipation, and sleepiness.  The risk of addiction (opioid use disorder). The longer you take opioids, the greater your risk of addiction. Pain that lasts for more than 3 months is called chronic pain. Managing chronic pain usually requires more than one approach and is often provided by a team of health care providers working together (multidisciplinary approach). Pain management may be done at a pain management center or pain clinic. Types of pain management without opioids Managing pain without opioids can involve:  Non-opioid medicines.  Exercises to help relieve pain and improve  strength and range of motion (physical therapy).  Therapy to help with everyday tasks and activities (occupational therapy).  Therapy to help you find ways to relieve pain by doing things you enjoy (recreational therapy).  Talk  therapy (psychotherapy) and other mental health therapies.  Medical treatments such as injections or devices.  Making lifestyle changes. Pain management options Non-opioid medicines Non-opioid medicines for pain may include medicines taken by mouth (oral medicines), such as:  Over-the-counter or prescription NSAIDs. These may be the first medicines used for pain. They work well for muscle and bone pain, and they reduce swelling.  Acetaminophen. This over-the-counter medicine may work well for milder pain but not swelling.  Antidepressants. These may be used to treat chronic pain. A certain type of antidepressant (tricyclics) is often used. These medicines are given in lower doses for pain than when used for depression.  Anticonvulsants. These are usually used to treat seizures but may also reduce nerve (neuropathic) pain.  Muscle relaxants. These relieve pain caused by sudden muscle tightening (spasms). You may also use a type of pain medicine that is applied to the skin as a patch, cream, or gel (topical analgesic), such as a numbing medicine. These may cause fewer side effects than oral medicines. Therapy Physical therapy involves doing exercises to gain strength and flexibility. A physical therapist may teach you exercises to move and stretch parts of your body that are weak, stiff, or painful. You can learn these exercises at physical therapy visits and practice them at home. Physical therapy may also involve:  Massage.  Heat wraps or applying heat or cold to affected areas.  Sending electrical signals through the skin to interrupt pain signals (transcutaneous electrical nerve stimulation, TENS).  Sending weak lasers through the skin to reduce pain and  swelling (low-level laser therapy).  Using signals from your body to help you learn to regulate pain (biofeedback). Occupational therapy helps you learn ways to function at home and work with less pain. Recreational therapy may involve trying new activities or hobbies, such as drawing or a physical activity. Types of mental health therapy for pain include:  Cognitive behavioral therapy (CBT) to help you learn coping skills for dealing with pain.  Acceptance and commitment therapy (ACT) to change the way you think and react to pain.  Relaxation therapies, including muscle relaxation exercises and focusing your mind on the present moment to lower stress (mindfulness-based stress reduction).  Pain management counseling. This may be individual, family, or group counseling.   Medical treatments Medical treatments for pain management include:  Nerve block injections. These may include a pain blocker and anti-inflammatory medicines. You may have injections: ? Near the spine to relieve chronic back or neck pain. ? Into joints to relieve back or joint pain. ? Into nerve areas that supply a painful area to relieve body pain. ? Into muscles (trigger point injections) to relieve some painful muscle conditions.  A medical device placed near your spine to help block pain signals and relieve nerve pain or chronic back pain (spinal cord stimulation device).  Acupuncture. Follow these instructions at home Medicines  Take over-the-counter and prescription medicines only as told by your health care provider.  If you are taking pain medicine, ask your health care providers about possible side effects to watch out for.  Do not drive or use heavy machinery while taking prescription pain medicine. Lifestyle  Do not use drugs or alcohol to reduce pain. Limit alcohol intake to no more than 1 drink a day for nonpregnant women and 2 drinks a day for men. One drink equals 12 oz of beer, 5 oz of wine, or 1  oz of hard liquor.  Do not use any products that contain nicotine  or tobacco, such as cigarettes and e-cigarettes. These can delay healing. If you need help quitting, ask your health care provider.  Eat a healthy diet and maintain a healthy weight. Poor diet and excess weight may make pain worse. ? Eat foods that are high in fiber. These include fresh fruits and vegetables, whole grains, and beans. ? Limit foods that are high in fat and processed sugars, such as fried and sweet foods.  Exercise regularly. Exercise lowers stress and may help relieve pain. ? Ask your health care provider what activities and exercises are safe for you. ? If your health care provider approves, join an exercise class that combines movement and stress reduction. Examples include yoga and tai chi.  Get enough sleep. Lack of sleep may make pain worse.  Lower stress as much as possible. Practice stress reduction techniques as told by your therapist.   General instructions  Work with all your pain management providers to find the treatments that work best for you. You are an important member of your pain management team. There are many things you can do to reduce pain on your own.  Consider joining an online or in-person support group for people who have chronic pain.  Keep all follow-up visits as told by your health care providers. This is important. Where to find more information You can find more information about managing pain without opioids from:  American Academy of Pain Medicine: painmed.[place] for Chronic Pain: instituteforchronicpain.org  American Chronic Pain Association: theacpa.org Contact a health care provider if:  You have side effects from pain medicine.  Your pain gets worse or does not get better with treatments or home care.  You are struggling with anxiety or depression. Summary  Many types of pain can be managed without opioids. Chronic pain may respond better to pain  management without opioids.  Pain is best managed with a team of providers working together.  Pain management without opioids may include non-opioid medicines, medical treatments, physical therapy, mental health therapy, and lifestyle changes.  Tell your health care providers if your pain gets worse or is not being managed well enough. This information is not intended to replace advice given to you by your health care provider. Make sure you discuss any questions you have with your health care provider. Document Revised: 06/30/2020 Document Reviewed: 06/30/2020 Elsevier Patient Education  [place].

## 2021-03-02 ENCOUNTER — Ambulatory Visit: Payer: Medicare Other | Admitting: Psychiatry

## 2021-03-02 ENCOUNTER — Ambulatory Visit
Admission: RE | Admit: 2021-03-02 | Discharge: 2021-03-02 | Disposition: A | Discharge status: Home / Self Care | Payer: Medicare Other | Source: Ambulatory Visit | Attending: Internal Medicine | Admitting: Internal Medicine

## 2021-03-02 ENCOUNTER — Telehealth: Payer: Self-pay | Admitting: Psychiatry

## 2021-03-02 ENCOUNTER — Ambulatory Visit
Admission: RE | Admit: 2021-03-02 | Discharge: 2021-03-02 | Disposition: A | Discharge status: Home / Self Care | Payer: Medicare Other | Attending: Internal Medicine | Admitting: Internal Medicine

## 2021-03-02 ENCOUNTER — Other Ambulatory Visit: Payer: Self-pay

## 2021-03-02 DIAGNOSIS — J189 Pneumonia, unspecified organism: Secondary | ICD-10-CM | POA: Diagnosis not present

## 2021-03-02 DIAGNOSIS — F3163 Bipolar disorder, current episode mixed, severe, without psychotic features: Secondary | ICD-10-CM

## 2021-03-02 MED ORDER — OLANZAPINE 5 MG PO TABS: 5.0000 mg | ORAL_TABLET | Freq: Every day | ORAL | 0 refills | Status: DC

## 2021-03-02 NOTE — Telephone Encounter (Signed)
Patient had an appointment with writer today at 59 PM-in office.   Patient however did not show up on time for his appointment and hence Janett Billow CMA attempted to contact patient, had to leave a voicemail.  Patient did not bother to call us back to reschedule or cancel this appointment today.  PHP coordinator did reach out to patient recently on 02/28/2021- and provided him with resources, support groups in the community since he had reported that his health insurance will not pay for his PHP program with  and that he could not afford it.  Have reviewed notes per Ms. Lorin Glass dated 02/28/2021 as well as communicated with her through secure chat that day.   Patient also was scheduled with Mr. Chryl Heck for psychotherapy sessions-for individual therapy after writer communicated with staff regarding this patient who needed help.  Patient has upcoming appointment with therapist on 03/09/2021.  Patient is aware about this.

## 2021-03-02 NOTE — Telephone Encounter (Signed)
Since olanzapine will run out today I have sent 7 more day supply of medication to the pharmacy today.  I have never had a chance to talk to this patient regarding how this medication is working since it was started since patient has not shown up for his appointment as discussed.  I have put in the instruction on olanzapine prescription-that patient needs to call the clinic.  Will discuss when he calls back.  CMA has also contacted patient and left another voicemail.

## 2021-03-02 NOTE — Telephone Encounter (Signed)
left message that since he missed his appt with dr. Shea Evans today and he has been non complaint with treatment that he was being dismissed from the office and that he will be supplied a 30 day supply of mediation and he will receive a letter with  Resources that he can contact.

## 2021-03-03 MED ORDER — SILDENAFIL CITRATE 20 MG PO TABS: 40.0000 mg | ORAL_TABLET | Freq: Every day | ORAL | 11 refills | Status: AC

## 2021-03-03 MED ORDER — ALBUTEROL SULFATE HFA 108 (90 BASE) MCG/ACT IN AERS: 2.0000 | INHALATION_SPRAY | Freq: Four times a day (QID) | RESPIRATORY_TRACT | 2 refills | Status: AC | PRN

## 2021-03-03 NOTE — Addendum Note (Signed)
Addended by: Orland Mustard on: 03/03/2021 05:18 PM   Modules accepted: Orders

## 2021-03-03 NOTE — Addendum Note (Signed)
Addended by: Orland Mustard on: 03/03/2021 04:54 PM   Modules accepted: Orders

## 2021-03-06 ENCOUNTER — Other Ambulatory Visit: Payer: Self-pay

## 2021-03-06 ENCOUNTER — Telehealth: Payer: Self-pay | Admitting: Psychiatry

## 2021-03-06 ENCOUNTER — Encounter: Payer: Self-pay | Admitting: Student in an Organized Health Care Education/Training Program

## 2021-03-06 ENCOUNTER — Ambulatory Visit (HOSPITAL_BASED_OUTPATIENT_CLINIC_OR_DEPARTMENT_OTHER): Payer: Medicare Other | Admitting: Student in an Organized Health Care Education/Training Program

## 2021-03-06 ENCOUNTER — Ambulatory Visit
Admission: RE | Admit: 2021-03-06 | Discharge: 2021-03-06 | Disposition: A | Discharge status: Home / Self Care | Payer: Medicare Other | Source: Ambulatory Visit | Attending: Student in an Organized Health Care Education/Training Program | Admitting: Student in an Organized Health Care Education/Training Program

## 2021-03-06 ENCOUNTER — Telehealth: Payer: Self-pay | Admitting: Internal Medicine

## 2021-03-06 VITALS — BP 151/91 | HR 94 | Temp 97.0°F | Resp 21 | Ht 71.0 in | Wt 219.0 lb

## 2021-03-06 DIAGNOSIS — G894 Chronic pain syndrome: Secondary | ICD-10-CM | POA: Diagnosis not present

## 2021-03-06 DIAGNOSIS — M7918 Myalgia, other site: Secondary | ICD-10-CM | POA: Insufficient documentation

## 2021-03-06 DIAGNOSIS — M47812 Spondylosis without myelopathy or radiculopathy, cervical region: Secondary | ICD-10-CM | POA: Insufficient documentation

## 2021-03-06 DIAGNOSIS — F5105 Insomnia due to other mental disorder: Secondary | ICD-10-CM

## 2021-03-06 DIAGNOSIS — F3163 Bipolar disorder, current episode mixed, severe, without psychotic features: Secondary | ICD-10-CM

## 2021-03-06 MED ORDER — LIDOCAINE HCL 2 % IJ SOLN
20.0000 mL | Freq: Once | INTRAMUSCULAR | Status: AC
  Administered 2021-03-06: 400 mg

## 2021-03-06 MED ORDER — DEXAMETHASONE SODIUM PHOSPHATE 10 MG/ML IJ SOLN
10.0000 mg | Freq: Once | INTRAMUSCULAR | Status: AC
  Administered 2021-03-06: 10 mg

## 2021-03-06 MED ORDER — OLANZAPINE 5 MG PO TABS: 5.0000 mg | ORAL_TABLET | Freq: Every day | ORAL | 0 refills | Status: AC

## 2021-03-06 MED ORDER — LAMOTRIGINE 100 MG PO TABS: 100.0000 mg | ORAL_TABLET | Freq: Every day | ORAL | 0 refills | Status: AC

## 2021-03-06 MED ORDER — ROPIVACAINE HCL 2 MG/ML IJ SOLN
INTRAMUSCULAR | Status: AC
  Filled 2021-03-06: qty 20

## 2021-03-06 MED ORDER — ZOLPIDEM TARTRATE 10 MG PO TABS: 10.0000 mg | ORAL_TABLET | Freq: Every evening | ORAL | 0 refills | Status: DC | PRN

## 2021-03-06 MED ORDER — DEXAMETHASONE SODIUM PHOSPHATE 10 MG/ML IJ SOLN
INTRAMUSCULAR | Status: AC
  Filled 2021-03-06: qty 1

## 2021-03-06 MED ORDER — ROPIVACAINE HCL 2 MG/ML IJ SOLN
9.0000 mL | Freq: Once | INTRAMUSCULAR | Status: AC
  Administered 2021-03-06: 9 mL via PERINEURAL

## 2021-03-06 MED ORDER — LIDOCAINE HCL (PF) 2 % IJ SOLN
INTRAMUSCULAR | Status: AC
  Filled 2021-03-06: qty 5

## 2021-03-06 NOTE — Progress Notes (Signed)
Safety precautions to be maintained throughout the outpatient stay will include: orient to surroundings, keep bed in low position, maintain call bell within reach at all times, provide assistance with transfer out of bed and ambulation.  

## 2021-03-06 NOTE — Patient Instructions (Signed)

## 2021-03-06 NOTE — Telephone Encounter (Signed)
Pt returned your call.  

## 2021-03-06 NOTE — Progress Notes (Signed)
Patient's Name: Johnny Villa  MRN: [id number]  Referring Provider: Orland Mustard *  DOB: [birth year]  PCP: McLean-Scocuzza, Nino Glow, MD  DOS: 03/06/2021  Note by: Gillis Santa, MD  Service setting: Ambulatory outpatient  Specialty: Interventional Pain Management  Patient type: Established  Location: [facility] (AMB) Pain Management Facility  Visit type: Interventional Procedure   Primary Reason for Visit: Interventional Pain Management Treatment. CC: Neck Pain (Bilateral )  Procedure:       Anesthesia, Analgesia, Anxiolysis:  Type: Cervical Facet Medial Branch Block(s) #3 and right trapezius and periscapular trigger point injection Primary Purpose: Therapeutic Region: Posterolateral cervical spine Level:  C4, C5, C6, Medial Branch Level(s). Injecting these levels blocks the  C4-5, C5-6, cervical facet joints. Laterality: Bilateral Paraspinal  Type: Local Anesthesia Indication(s): Analgesia and Anxiety Route: Infiltration ([place]/IM) IV Access: Declined Sedation: Declined  Local Anesthetic: Lidocaine 1%   Indications: 1. Cervical facet joint syndrome   2. Myofascial pain syndrome, cervical   3. Chronic pain syndrome    Pain Score: Pre-procedure: 6 /10 Post-procedure: 0-No pain (moving head and arms)/10  [name] follows up today for increased right-sided neck pain that radiates into his right trapezius, right shoulder, right proximal bicep.  He also has left-sided neck pain that radiates into his distal shoulder.  This is related to cervical facet joint syndrome.  He is status post cervical facet radiofrequency ablation bilaterally at C4, C5, C6, C7 with me over 2 years ago that provided him with significant pain relief.  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  He feels that he has exacerbated his neck during training.  He has tried physical therapy exercises that he is learned at the gym with limited response.  He takes gabapentin as well as naproxen with limited  response.  We discussed repeating bilateral C4, C5, C6 cervical facet medial branch nerve blocks as well as trigger point injection in his trapezius region.  Hopefully this should help temporize his pain.  Depending upon how he does with we will consider repeat cervical radiofrequency ablation.  Pre-op Assessment:  Mr. Johnny Villa is a 45 y.o. (year old), male patient, seen today for interventional treatment. He  has a past surgical history that includes Appendectomy; Colonoscopy with propofol (N/A, 12/30/2017); and Esophagogastroduodenoscopy (egd) with propofol (N/A, 12/30/2017). Mr. Johnny Villa has a current medication list which includes the following prescription(s): lactobacillus, albuterol, amlodipine, buspirone, clomiphene, folic acid, gabapentin, lamotrigine, lamotrigine, multi-vitamin, mupirocin ointment, naproxen sodium, olanzapine, ondansetron, pantoprazole, sildenafil, sumatriptan, and zolpidem. His primarily concern today is the Neck Pain (Bilateral )  Initial Vital Signs:  Pulse/HCG Rate: 96  Temp: (!) 97 F (36.1 C) Resp: 16 BP: (!) 142/97 SpO2: 98 %  BMI: Estimated body mass index is 30.54 kg/m as calculated from the following:   Height as of this encounter: 5\' 11"  (1.803 m).   Weight as of this encounter: 219 lb (99.3 kg).  Risk Assessment: Allergies: Reviewed. He has No Known Allergies.  Allergy Precautions: None required Coagulopathies: Reviewed. None identified.  Blood-thinner therapy: None at this time Active Infection(s): Reviewed. None identified. Mr. Johnny Villa is afebrile  Site Confirmation: Mr. Johnny Villa was asked to confirm the procedure and laterality before marking the site Procedure checklist: Completed Consent: Before the procedure and under the influence of no sedative(s), amnesic(s), or anxiolytics, the patient was informed of the treatment options, risks and possible complications. To fulfill our ethical and legal obligations, as recommended by the American Medical  Association's Code of Ethics, I have informed  the patient of my clinical impression; the nature and purpose of the treatment or procedure; the risks, benefits, and possible complications of the intervention; the alternatives, including doing nothing; the risk(s) and benefit(s) of the alternative treatment(s) or procedure(s); and the risk(s) and benefit(s) of doing nothing. The patient was provided information about the general risks and possible complications associated with the procedure. These may include, but are not limited to: failure to achieve desired goals, infection, bleeding, organ or nerve damage, allergic reactions, paralysis, and death. In addition, the patient was informed of those risks and complications associated to Spine-related procedures, such as failure to decrease pain; infection (i.e.: Meningitis, epidural or intraspinal abscess); bleeding (i.e.: epidural hematoma, subarachnoid hemorrhage, or any other type of intraspinal or peri-dural bleeding); organ or nerve damage (i.e.: Any type of peripheral nerve, nerve root, or spinal cord injury) with subsequent damage to sensory, motor, and/or autonomic systems, resulting in permanent pain, numbness, and/or weakness of one or several areas of the body; allergic reactions; (i.e.: anaphylactic reaction); and/or death. Furthermore, the patient was informed of those risks and complications associated with the medications. These include, but are not limited to: allergic reactions (i.e.: anaphylactic or anaphylactoid reaction(s)); adrenal axis suppression; blood sugar elevation that in diabetics may result in ketoacidosis or comma; water retention that in patients with history of congestive heart failure may result in shortness of breath, pulmonary edema, and decompensation with resultant heart failure; weight gain; swelling or edema; medication-induced neural toxicity; particulate matter embolism and blood vessel occlusion with resultant organ, and/or  nervous system infarction; and/or aseptic necrosis of one or more joints. Finally, the patient was informed that Medicine is not an exact science; therefore, there is also the possibility of unforeseen or unpredictable risks and/or possible complications that may result in a catastrophic outcome. The patient indicated having understood very clearly. We have given the patient no guarantees and we have made no promises. Enough time was given to the patient to ask questions, all of which were answered to the patient's satisfaction. Mr. Eichhorst has indicated that he wanted to continue with the procedure. Attestation: I, the ordering provider, attest that I have discussed with the patient the benefits, risks, side-effects, alternatives, likelihood of achieving goals, and potential problems during recovery for the procedure that I have provided informed consent. Date  Time:   Pre-Procedure Preparation:  Monitoring: As per clinic protocol. Respiration, ETCO2, SpO2, BP, heart rate and rhythm monitor placed and checked for adequate function Safety Precautions: Patient was assessed for positional comfort and pressure points before starting the procedure. Time-out: I initiated and conducted the "Time-out" before starting the procedure, as per protocol. The patient was asked to participate by confirming the accuracy of the "Time Out" information. Verification of the correct person, site, and procedure were performed and confirmed by me, the nursing staff, and the patient. "Time-out" conducted as per Joint Commission's Universal Protocol (UP.01.01.01). Time: 9449  Description of Procedure:       Position: Prone with head of the table raised to facilitate breathing. Laterality: Bilateral. The procedure was performed in identical fashion on both sides. Level: C4, C5, C6, Medial Branch Level(s). Area Prepped: Posterior Cervico-thoracic Region Prepping solution: ChloraPrep (2% chlorhexidine gluconate and 70%  isopropyl alcohol) Safety Precautions: Aspiration looking for blood return was conducted prior to all injections. At no point did we inject any substances, as a needle was being advanced. Before injecting, the patient was told to immediately notify me if he was experiencing any new onset of "  ringing in the ears, or metallic taste in the mouth". No attempts were made at seeking any paresthesias. Safe injection practices and needle disposal techniques used. Medications properly checked for expiration dates. SDV (single dose vial) medications used. After the completion of the procedure, all disposable equipment used was discarded in the proper designated medical waste containers. Local Anesthesia: Protocol guidelines were followed. The patient was positioned over the fluoroscopy table. The area was prepped in the usual manner. The time-out was completed. The target area was identified using fluoroscopy. A 12-in long, straight, sterile hemostat was used with fluoroscopic guidance to locate the targets for each level blocked. Once located, the skin was marked with an approved surgical skin marker. Once all sites were marked, the skin (epidermis, dermis, and hypodermis), as well as deeper tissues (fat, connective tissue and muscle) were infiltrated with a small amount of a short-acting local anesthetic, loaded on a 10cc syringe with a 25G, 1.5-in  Needle. An appropriate amount of time was allowed for local anesthetics to take effect before proceeding to the next step. Local Anesthetic: Lidocaine 1.0% The unused portion of the local anesthetic was discarded in the proper designated containers. Technical explanation of process:   C4 Medial Branch Nerve Block (MBB): The target area for the C4 dorsal medial articular branch is the lateral concave waist of the articular pillar of C4. Under fluoroscopic guidance, a Quincke needle was inserted until contact was made with os over the postero-lateral aspect of the articular  pillar of C4 (target area). After negative aspiration for blood,1 mL of the nerve block solution was injected without difficulty or complication. The needle was removed intact. C5 Medial Branch Nerve Block (MBB): The target area for the C5 dorsal medial articular branch is the lateral concave waist of the articular pillar of C5. Under fluoroscopic guidance, a Quincke needle was inserted until contact was made with os over the postero-lateral aspect of the articular pillar of C5 (target area). After negative aspiration for blood, 13mL of the nerve block solution was injected without difficulty or complication. The needle was removed intact. C6 Medial Branch Nerve Block (MBB): The target area for the C6 dorsal medial articular branch is the lateral concave waist of the articular pillar of C6. Under fluoroscopic guidance, a Quincke needle was inserted until contact was made with os over the postero-lateral aspect of the articular pillar of C6 (target area). After negative aspiration for blood, 1 mL of the nerve block solution was injected without difficulty or complication. The needle was removed intact. Procedural Needles: 25-gauge, 3.5-inch, Quincke needles used for all levels. Nerve block solution: 12 cc solution made of 10 cc of 0.2% ropivacaine, 2 cc of Decadron 10 mg/cc.  Approximately 2 cc injected at each level.    Approximately 10 trigger point injections were also performed in the right trapezius and right periscapular region with dry needling also performed.  TP injections were performed with 0.5 to 1 cc of 0.2% ropivacaine.  Once the entire procedure was completed, the treated area was cleaned, making sure to leave some of the prepping solution back to take advantage of its long term bactericidal properties.  Vitals:   03/06/21 0845 03/06/21 0855 03/06/21 0905 03/06/21 0915  BP: 137/84 139/85 (!) 144/87 (!) 151/91  Pulse: 98 96 93 94  Resp: 19 20 20  (!) 21  Temp:      TempSrc:      SpO2: 99%  99% 100% 100%  Weight:      Height:  Start Time: 0855 hrs. End Time: 0911 hrs.  Imaging Guidance (Spinal):  Type of Imaging Technique: Fluoroscopy Guidance (Spinal) Indication(s): Assistance in needle guidance and placement for procedures requiring needle placement in or near specific anatomical locations not easily accessible without such assistance. Exposure Time: Please see nurses notes. Contrast: None used. Fluoroscopic Guidance: I was personally present during the use of fluoroscopy. "Tunnel Vision Technique" used to obtain the best possible view of the target area. Parallax error corrected before commencing the procedure. "Direction-depth-direction" technique used to introduce the needle under continuous pulsed fluoroscopy. Once target was reached, antero-posterior, oblique, and lateral fluoroscopic projection used confirm needle placement in all planes. Images permanently stored in EMR. Interpretation: No contrast injected. I personally interpreted the imaging intraoperatively. Adequate needle placement confirmed in multiple planes. Permanent images saved into the patient's record.  Post-operative Assessment:  Post-procedure Vital Signs:  Pulse/HCG Rate: 94  Temp: (!) 97 F (36.1 C) Resp: (!) 21 BP: (!) 151/91 SpO2: 100 %  EBL: None  Complications: No immediate post-treatment complications observed by team, or reported by patient.  Note: The patient tolerated the entire procedure well. A repeat set of vitals were taken after the procedure and the patient was kept under observation following institutional policy, for this type of procedure. Post-procedural neurological assessment was performed, showing return to baseline, prior to discharge. The patient was provided with post-procedure discharge instructions, including a section on how to identify potential problems. Should any problems arise concerning this procedure, the patient was given instructions to immediately contact  us, at any time, without hesitation. In any case, we plan to contact the patient by telephone for a follow-up status report regarding this interventional procedure.  Comments:  No additional relevant information. 5 out of 5 strength bilateral upper extremity: Shoulder abduction, elbow flexion, elbow extension, thumb extension.  Plan of Care    Imaging Orders     [facility] C-ARM 1-60 MIN NO REPORT Procedure Orders    No procedure(s) ordered today    Medications ordered for procedure: Meds ordered this encounter  Medications  . lidocaine (XYLOCAINE) 2 % (with pres) injection 400 mg  . ropivacaine (PF) 2 mg/mL (0.2%) (NAROPIN) injection 9 mL  . ropivacaine (PF) 2 mg/mL (0.2%) (NAROPIN) injection 9 mL  . dexamethasone (DECADRON) injection 10 mg  . dexamethasone (DECADRON) injection 10 mg   Medications administered: We administered lidocaine, ropivacaine (PF) 2 mg/mL (0.2%), ropivacaine (PF) 2 mg/mL (0.2%), dexamethasone, and dexamethasone.  See the medical record for exact dosing, route, and time of administration.  Disposition: Discharge home  Discharge Date & Time: 03/06/2021;   hrs.   Physician-requested Follow-up: Return in about 8 weeks (around 05/01/2021) for Post Procedure Evaluation, virtual.  Future Appointments  Date Time Provider [place]  03/07/2021 10:15 AM [facility] NURSE [facility]  03/09/2021 10:00 AM Shade Flood [facility] None  03/20/2021 11:00 AM Gillis Santa, MD [facility] None  05/01/2021  3:00 PM Gillis Santa, MD [facility] None  05/11/2021  3:00 PM McLean-Scocuzza, Nino Glow, MD [facility]  03/02/2022  2:45 PM [place] ADVISOR [facility]   Primary Care Physician: McLean-Scocuzza, Nino Glow, MD Location: [facility] Out[facility] Note by: Gillis Santa, MD Date: 03/06/2021; Time: 9:30 AM  Disclaimer:  Medicine is not an exact science. The only guarantee in medicine is that nothing is guaranteed. It is important to  note that the decision to proceed with this intervention was based on the information collected from the patient. The  Data and conclusions were drawn from the patient's questionnaire, the interview, and the physical examination. Because the information was provided in large part by the patient, it cannot be guaranteed that it has not been purposely or unconsciously manipulated. Every effort has been made to obtain as much relevant data as possible for this evaluation. It is important to note that the conclusions that lead to this procedure are derived in large part from the available data. Always take into account that the treatment will also be dependent on availability of resources and existing treatment guidelines, considered by other Pain Management Practitioners as being common knowledge and practice, at the time of the intervention. For Medico-Legal purposes, it is also important to point out that variation in procedural techniques and pharmacological choices are the acceptable norm. The indications, contraindications, technique, and results of the above procedure should only be interpreted and judged by a Board-Certified Interventional Pain Specialist with extensive familiarity and expertise in the same exact procedure and technique.

## 2021-03-06 NOTE — Telephone Encounter (Signed)
lft vm for pt to call ofc to sch CT chest. thanks 

## 2021-03-06 NOTE — Telephone Encounter (Signed)
I have sent refills for Ambien-30-day supply, olanzapine 5 mg - 30-day supply, Lamictal 100 mg p.o. daily-30-day supply.  Patient has 1 more refill available on Lamictal 25 mg daily Patient also has refill available on BuSpar.  Patient did not show up for his appointment twice.  And this was after his first appointment was rescheduled for another day when he did not show up for his first appointment.  The second time he did not even call at the time of appointment to cancel or reschedule.  He just no showed.  Hence patient was provided resources in the community to establish care.  Patient has been noncompliant with clinic policy.

## 2021-03-07 ENCOUNTER — Telehealth: Payer: Self-pay | Admitting: *Deleted

## 2021-03-07 ENCOUNTER — Ambulatory Visit (INDEPENDENT_AMBULATORY_CARE_PROVIDER_SITE_OTHER): Payer: Medicare Other

## 2021-03-07 DIAGNOSIS — Z23 Encounter for immunization: Secondary | ICD-10-CM | POA: Diagnosis not present

## 2021-03-07 NOTE — Telephone Encounter (Signed)
No problems post procedure. 

## 2021-03-07 NOTE — Progress Notes (Signed)
Patient presented for pneumovax 23 injection to left deltoid, patient voiced no concerns nor showed any signs of distress during injection.

## 2021-03-09 ENCOUNTER — Ambulatory Visit (HOSPITAL_COMMUNITY): Payer: Self-pay | Admitting: Licensed Clinical Social Worker

## 2021-03-15 ENCOUNTER — Ambulatory Visit: Payer: Medicare Other

## 2021-03-20 ENCOUNTER — Ambulatory Visit: Payer: Medicare Other | Attending: Internal Medicine

## 2021-03-20 ENCOUNTER — Ambulatory Visit: Payer: Medicare Other | Admitting: Student in an Organized Health Care Education/Training Program

## 2021-04-12 ENCOUNTER — Telehealth: Payer: Self-pay

## 2021-04-12 NOTE — Telephone Encounter (Signed)
Pt is requesting a referral to a physiatrist within the [facility] system

## 2021-04-14 ENCOUNTER — Telehealth: Payer: Self-pay | Admitting: Student in an Organized Health Care Education/Training Program

## 2021-04-14 DIAGNOSIS — M5416 Radiculopathy, lumbar region: Secondary | ICD-10-CM

## 2021-04-14 NOTE — Telephone Encounter (Signed)
Attempted to call patient to assess location of pain.  LM

## 2021-04-14 NOTE — Telephone Encounter (Signed)
Spoke with patient.  States he is having pain going down his right leg, sciatic nerve.  States that you did a procedure approx 2 yrs ago that helped it until now and would like to have it repeated.  He has a follow up appointment 8-1 to go over post procedure results from his last procedure.

## 2021-04-17 NOTE — Telephone Encounter (Signed)
Please start the process for a LESI.  Dr Holley Raring has put in an order for a PRN procedure.

## 2021-04-17 NOTE — Addendum Note (Signed)
Addended by: Gillis Santa on: 04/17/2021 09:54 AM   Modules accepted: Orders

## 2021-04-19 NOTE — Telephone Encounter (Signed)
No prior auth reqd. It can be scheduled whenever he is ready

## 2021-05-01 ENCOUNTER — Telehealth: Payer: Self-pay

## 2021-05-01 ENCOUNTER — Telehealth: Payer: Medicare Other | Admitting: Student in an Organized Health Care Education/Training Program

## 2021-05-01 NOTE — Telephone Encounter (Signed)
LM for patient to call office for pre virtual appointment questionsl

## 2021-05-02 ENCOUNTER — Other Ambulatory Visit: Payer: Self-pay

## 2021-05-02 ENCOUNTER — Ambulatory Visit
Payer: Medicare Other | Attending: Student in an Organized Health Care Education/Training Program | Admitting: Student in an Organized Health Care Education/Training Program

## 2021-05-02 DIAGNOSIS — M5416 Radiculopathy, lumbar region: Secondary | ICD-10-CM

## 2021-05-02 NOTE — Progress Notes (Signed)
No pre-procedure charting done

## 2021-05-11 ENCOUNTER — Encounter: Payer: Self-pay | Admitting: Internal Medicine

## 2021-05-11 ENCOUNTER — Telehealth (INDEPENDENT_AMBULATORY_CARE_PROVIDER_SITE_OTHER): Payer: Medicare Other | Admitting: Internal Medicine

## 2021-05-11 ENCOUNTER — Other Ambulatory Visit: Payer: Self-pay

## 2021-05-11 VITALS — BP 128/79 | Ht 71.0 in | Wt 225.0 lb

## 2021-05-11 DIAGNOSIS — F3130 Bipolar disorder, current episode depressed, mild or moderate severity, unspecified: Secondary | ICD-10-CM | POA: Diagnosis not present

## 2021-05-11 DIAGNOSIS — F5105 Insomnia due to other mental disorder: Secondary | ICD-10-CM | POA: Diagnosis not present

## 2021-05-11 DIAGNOSIS — F410 Panic disorder [episodic paroxysmal anxiety] without agoraphobia: Secondary | ICD-10-CM

## 2021-05-11 DIAGNOSIS — F4310 Post-traumatic stress disorder, unspecified: Secondary | ICD-10-CM | POA: Diagnosis not present

## 2021-05-11 DIAGNOSIS — F319 Bipolar disorder, unspecified: Secondary | ICD-10-CM

## 2021-05-11 DIAGNOSIS — T50902D Poisoning by unspecified drugs, medicaments and biological substances, intentional self-harm, subsequent encounter: Secondary | ICD-10-CM

## 2021-05-11 DIAGNOSIS — M5416 Radiculopathy, lumbar region: Secondary | ICD-10-CM

## 2021-05-11 DIAGNOSIS — R7989 Other specified abnormal findings of blood chemistry: Secondary | ICD-10-CM | POA: Diagnosis not present

## 2021-05-11 DIAGNOSIS — F3132 Bipolar disorder, current episode depressed, moderate: Secondary | ICD-10-CM

## 2021-05-11 MED ORDER — ZOLPIDEM TARTRATE 10 MG PO TABS: 10.0000 mg | ORAL_TABLET | Freq: Every evening | ORAL | 1 refills | Status: AC | PRN

## 2021-05-11 NOTE — Progress Notes (Signed)
Virtual Visit via Video Note  I connected with Johnny Villa  on 05/11/21 at  3:30 PM EDT by a video enabled telemedicine application and verified that I am speaking with the correct person using two identifiers.  Location patient: home, [place] Location provider:work or home office Persons participating in the virtual visit: patient, provider  I discussed the limitations of evaluation and management by telemedicine and the availability of in person appointments. The patient expressed understanding and agreed to proceed.   HPI:  Acute telemedicine visit for : H/o chronic insomnia, ptsd, bipolar/depression, chronic pain alcohol abuse with alcohol intoxication 4/20-01/30/21 with bilateral pneumonia resolving repeat cxr 03/02/21 w/o sx's sob, fever, cough as of today  IMPRESSION: Nonspecific bibasilar interstitial prominence. Otherwise no acute process. Requesting refill of ambien 10 mg qhs until est. With new psych at [facility] no longer will see Dr.Eappen will see NP Jayme Cloud currently at rehab center in [place] they are not giving him ambien 10 mg qhs but remeron 30 mg qhs and he is tolerating ok rec further refill ambien from psych and pt understands today  Currently in [place] for mental health [facility] doing well has been there for 2 months and doing well will come back   Chronic pain L2-3, L4 back pain with facet injections L2-3/4  in these areas and L4/5 and getting spinal epidural and PT 3x per week    Labs test 100.3 low in 2 weeks has appt with endocrine [facility] on Clomid 25 mg qd and will disc if does needs to be titrated going forward with them   -COVID-19 vaccine status: 4/4  ROS: See pertinent positives and negatives per HPI.  Past Medical History:  Diagnosis Date   Bipolar disorder [facility])    psychiatrist in [place]   Blood in stool    Depression    Drug abuse in remission [facility])    sober for 6 years but relapse 01/18/21 [facility] on ventilator  for etoh/thc intoxication found with loc   Fatty liver 01/2018   H/O alcohol abuse    Headache    migraines   Hypertension    Low testosterone in male    Multifocal pneumonia    01/18/21 ? aspiration after thc and alcohol intoxication   PTSD (post-traumatic stress disorder)     Past Surgical History:  Procedure Laterality Date   APPENDECTOMY     2006   COLONOSCOPY WITH PROPOFOL N/A 12/30/2017   Procedure: COLONOSCOPY WITH PROPOFOL;  Surgeon: Lin Landsman, MD;  Location: [facility] ENDOSCOPY;  Service: Gastroenterology;  Laterality: N/A;   ESOPHAGOGASTRODUODENOSCOPY (EGD) WITH PROPOFOL N/A 12/30/2017   Procedure: ESOPHAGOGASTRODUODENOSCOPY (EGD) WITH PROPOFOL;  Surgeon: Lin Landsman, MD;  Location: [facility] ENDOSCOPY;  Service: Gastroenterology;  Laterality: N/A;     Current Outpatient Medications:    ACIDOPHILUS LACTOBACILLUS PO, Take 1 capsule by mouth daily., Disp: , Rfl:    albuterol (VENTOLIN HFA) 108 (90 Base) MCG/ACT inhaler, Inhale 2 puffs into the lungs every 6 (six) hours as needed for wheezing or shortness of breath., Disp: 18 g, Rfl: 2   amLODipine (NORVASC) 10 MG tablet, Take 1 tablet (10 mg total) by mouth daily., Disp: 30 tablet, Rfl: 11   busPIRone (BUSPAR) 30 MG tablet, Take 1 tablet (30 mg total) by mouth in the morning and at bedtime., Disp: 180 tablet, Rfl: 1   clomiPHENE (CLOMID) 50 MG tablet, Take 25 mg by mouth daily., Disp: , Rfl:  folic acid (FOLVITE) 694 MCG tablet, Take by mouth., Disp: , Rfl:    gabapentin (NEURONTIN) 600 MG tablet, Take 1 tablet (600 mg total) by mouth 3 (three) times daily., Disp: 90 tablet, Rfl: 5   lamoTRIgine (LAMICTAL) 100 MG tablet, Take 1 tablet (100 mg total) by mouth daily. Take along with 25 mg daily, Disp: 30 tablet, Rfl: 0   Multiple Vitamin (MULTI-VITAMIN) tablet, Take 1 tablet by mouth daily., Disp: , Rfl:    Naproxen Sodium 220 MG CAPS, Take by mouth., Disp: , Rfl:    ondansetron (ZOFRAN-ODT) 4 MG disintegrating tablet,  Take 2 tablets (8 mg total) by mouth every 8 (eight) hours as needed., Disp: 90 tablet, Rfl: 1   pantoprazole (PROTONIX) 40 MG tablet, Take 1 tablet (40 mg total) by mouth daily., Disp: 90 tablet, Rfl: 2   sildenafil (REVATIO) 20 MG tablet, Take 2 tablets (40 mg total) by mouth daily in the afternoon., Disp: 60 tablet, Rfl: 11   SUMAtriptan (IMITREX) 100 MG tablet, Take 1 tablet (100 mg total) by mouth every 2 (two) hours as needed. Max dose 200 mg/24 hours. Limit to 2x per week, Disp: 9 tablet, Rfl: 11   lamoTRIgine (LAMICTAL) 25 MG tablet, Take 1 tablet (25 mg total) by mouth daily. Take along with 100 mg daily, Disp: 30 tablet, Rfl: 1   mupirocin ointment (BACTROBAN) 2 %, Apply 1 application topically 2 (two) times daily., Disp: 30 g, Rfl: 0   OLANZapine (ZYPREXA) 5 MG tablet, Take 1 tablet (5 mg total) by mouth at bedtime., Disp: 30 tablet, Rfl: 0   zolpidem (AMBIEN) 10 MG tablet, Take 1 tablet (10 mg total) by mouth at bedtime as needed for sleep., Disp: 30 tablet, Rfl: 1  EXAM:  VITALS per patient if applicable:  GENERAL: alert, oriented, appears well and in no acute distress  HEENT: atraumatic, conjunttiva clear, no obvious abnormalities on inspection of external nose and ears  NECK: normal movements of the head and neck  LUNGS: on inspection no signs of respiratory distress, breathing rate appears normal, no obvious gross SOB, gasping or wheezing  CV: no obvious cyanosis  MS: moves all visible extremities without noticeable abnormality  PSYCH/NEURO: pleasant and cooperative, no obvious depression or anxiety, speech and thought processing grossly intact  ASSESSMENT AND PLAN:  Discussed the following assessment and plan:  Insomnia due to mental condition - Plan: zolpidem (AMBIEN) 10 MG tablet Bipolar I disorder, most recent episode depressed ([name]) moderate PTSD (post-traumatic stress disorder) Panic attacks Intentional drug overdose alcohol intoxication 4/20-5/(2 or 12/2020  [facility] admitted ICU on ventilator with b/l aspiration pneumonia subsequent encounter repeat CXR 03/02/21 improved w/o sx's but still + aspiration changes clinically well will not repeat CXR for now  Further psych meds I.e Lorrin Mais and others from new psych NP [facility] Cloud appt pending and will no longer f/u with Dr. Shea Evans   Low testosterone in male F/u [facility] endocrine reviewed labs 02/09/21 test bioaval 100.3 slightly low   Lumbar radicular pain F/u [facility] pain clinic   HM Flu shot utd Tdap utd  [employer] 4/4  Declines MMR lab check  Utd pna 23 vaccine 03/07/21   HIV neg 05/24/17  PSA, CBC, FSH, LH, test 06/01/20 with endocrine 06/01/20 [facility] endocrine due for f/u 05/2021  tsh 0.38 and Ft4 0.77 01/19/21  Lipid panel nl 01/19/21 [facility]  PSA 0.51 08/31/20  Colonoscopy egd had 12/30/17 Dr. Marius Ditch  rec healthy diet and exercise    -we discussed possible serious  and likely etiologies, options for evaluation and workup, limitations of telemedicine visit vs in person visit, treatment, treatment risks and precautions. Pt prefers to treat via telemedicine empirically rather than in person at this moment.      I discussed the assessment and treatment plan with the patient. The patient was provided an opportunity to ask questions and all were answered. The patient agreed with the plan and demonstrated an understanding of the instructions.    Time spent 20 min Delorise Jackson, MD

## 2021-05-24 ENCOUNTER — Ambulatory Visit: Payer: Medicare Other | Admitting: Student in an Organized Health Care Education/Training Program

## 2021-05-30 ENCOUNTER — Encounter: Payer: Self-pay | Admitting: Emergency Medicine

## 2021-05-30 ENCOUNTER — Other Ambulatory Visit: Payer: Self-pay

## 2021-05-30 ENCOUNTER — Emergency Department
Admission: EM | Admit: 2021-05-30 | Discharge: 2021-05-31 | Disposition: A | Discharge status: Home / Self Care | Payer: Medicare Other | Attending: Emergency Medicine | Admitting: Emergency Medicine

## 2021-05-30 DIAGNOSIS — I10 Essential (primary) hypertension: Secondary | ICD-10-CM | POA: Diagnosis not present

## 2021-05-30 DIAGNOSIS — R0789 Other chest pain: Secondary | ICD-10-CM | POA: Diagnosis not present

## 2021-05-30 DIAGNOSIS — F32A Depression, unspecified: Secondary | ICD-10-CM | POA: Insufficient documentation

## 2021-05-30 DIAGNOSIS — F10129 Alcohol abuse with intoxication, unspecified: Secondary | ICD-10-CM | POA: Diagnosis present

## 2021-05-30 DIAGNOSIS — K2970 Gastritis, unspecified, without bleeding: Secondary | ICD-10-CM | POA: Insufficient documentation

## 2021-05-30 DIAGNOSIS — F1010 Alcohol abuse, uncomplicated: Secondary | ICD-10-CM | POA: Diagnosis not present

## 2021-05-30 DIAGNOSIS — R000 Tachycardia, unspecified: Secondary | ICD-10-CM | POA: Diagnosis not present

## 2021-05-30 DIAGNOSIS — E860 Dehydration: Secondary | ICD-10-CM | POA: Diagnosis not present

## 2021-05-30 DIAGNOSIS — Z79899 Other long term (current) drug therapy: Secondary | ICD-10-CM | POA: Insufficient documentation

## 2021-05-30 DIAGNOSIS — R079 Chest pain, unspecified: Secondary | ICD-10-CM | POA: Diagnosis not present

## 2021-05-30 DIAGNOSIS — Z20822 Contact with and (suspected) exposure to covid-19: Secondary | ICD-10-CM | POA: Diagnosis not present

## 2021-05-30 DIAGNOSIS — F10929 Alcohol use, unspecified with intoxication, unspecified: Secondary | ICD-10-CM

## 2021-05-30 DIAGNOSIS — F4310 Post-traumatic stress disorder, unspecified: Secondary | ICD-10-CM | POA: Diagnosis not present

## 2021-05-30 DIAGNOSIS — Y908 Blood alcohol level of 240 mg/100 ml or more: Secondary | ICD-10-CM | POA: Insufficient documentation

## 2021-05-30 DIAGNOSIS — Z87891 Personal history of nicotine dependence: Secondary | ICD-10-CM | POA: Insufficient documentation

## 2021-05-30 DIAGNOSIS — K2920 Alcoholic gastritis without bleeding: Secondary | ICD-10-CM | POA: Diagnosis not present

## 2021-05-30 LAB — COMPREHENSIVE METABOLIC PANEL
ALT: 142 U/L — ABNORMAL HIGH (ref 0–44)
AST: 216 U/L — ABNORMAL HIGH (ref 15–41)
Albumin: 4.6 g/dL (ref 3.5–5.0)
Alkaline Phosphatase: 81 U/L (ref 38–126)
Anion gap: 14 (ref 5–15)
BUN: 15 mg/dL (ref 6–20)
CO2: 26 mmol/L (ref 22–32)
Calcium: 8.2 mg/dL — ABNORMAL LOW (ref 8.9–10.3)
Chloride: 95 mmol/L — ABNORMAL LOW (ref 98–111)
Creatinine, Ser: 1 mg/dL (ref 0.61–1.24)
GFR, Estimated: >60 mL/min (ref >60)
Glucose, Bld: 183 mg/dL — ABNORMAL HIGH (ref 70–99)
Potassium: 3.7 mmol/L (ref 3.5–5.1)
Sodium: 135 mmol/L (ref 135–145)
Total Bilirubin: 2.2 mg/dL — ABNORMAL HIGH (ref 0.3–1.2)
Total Protein: 8 g/dL (ref 6.5–8.1)

## 2021-05-30 LAB — CBC
HCT: 47.8 % (ref 39.0–52.0)
Hemoglobin: 17.6 g/dL — ABNORMAL HIGH (ref 13.0–17.0)
MCH: 32.3 pg (ref 26.0–34.0)
MCHC: 36.8 g/dL — ABNORMAL HIGH (ref 30.0–36.0)
MCV: 87.7 fL (ref 80.0–100.0)
Platelets: 228 10*3/uL (ref 150–400)
RBC: 5.45 MIL/uL (ref 4.22–5.81)
RDW: 11.7 % (ref 11.5–15.5)
WBC: 6.1 10*3/uL (ref 4.0–10.5)
nRBC: 0 % (ref 0.0–0.2)

## 2021-05-30 LAB — ETHANOL: Alcohol, Ethyl (B): 455 mg/dL (ref <10)

## 2021-05-30 NOTE — ED Notes (Signed)
Pt unable to sign MSE waiver d/t ETOH intoxication.

## 2021-05-30 NOTE — Discharge Instructions (Addendum)
Please seek medical attention for any high fevers, chest pain, shortness of breath, change in behavior, persistent vomiting, bloody stool or any other new or concerning symptoms.  

## 2021-05-30 NOTE — ED Triage Notes (Signed)
Pt to ED brought in by girlfriend wanting to detox from alcohol.  States had 6 beers today, unsure what time last drink was, denies drug use.  States has had withdrawals but then states he hasn't and never has had a seizure.  Pt denies SI or HI.  Pt hiccupping and swaying in wheelchair in triage, answering some questions, calm and cooperative.

## 2021-05-30 NOTE — ED Notes (Signed)
Pt reminded of need for urine sample at this time.

## 2021-05-30 NOTE — ED Notes (Signed)
Pt to 19H now, provided with blanket by Annie Main, RN. Pt resting quietly, not communicating with staff at this time, hiccups are present. Pt acting as if under the influence of alcohol. Will allow patient to be assessed by MD for further interventions.

## 2021-05-30 NOTE — ED Notes (Addendum)
Pt girlfried who brought patient into ER calls this nurse back. Dr. Archie Balboa had this nurse call to have patient picked up. Candace Coble states that pt is not safe to be DC'd due to continuous alcohol intake and no place to go. Reports that patient is living in new apartment like a hoarder and has nothing but beer and wine bottle laying everywhere. Reports pt goes to sleep intoxicated, wakes up, and only drinks more alcohol. States that in the past in this state he has OD on medication, has aspirated in vomit, and has left rehab, gotten drunk, and was a missing person in [place]. Ms. Curt Bears reports that she is concerned with pt safety, questions if he needs to be admitted inpatient with psych, explained that at this time with pt not communicating with staff, alcohol is not a psych admission criteria. This was communicated with Dr. Archie Balboa at this time while Ms. Coble was on hold. States that the ER will eval patient and allow him to sober up to obtain an evaluation. Ms. Curt Bears was notified of this at this time.   Ms. Curt Bears reports that she has patients phone and wallet should he wake up looking for it.

## 2021-05-30 NOTE — ED Notes (Signed)
Pt more awake at the moment and states he wants to go home, pt educated on plan of care at this time and agrees. Pt moving around in bed periodically. Will continue to monitor.

## 2021-05-30 NOTE — ED Notes (Signed)
Dr. Archie Balboa with patient again at this time

## 2021-05-30 NOTE — ED Notes (Signed)
Attempted to contact pts girlfriend as directed by Dr. Archie Balboa for plan of care update. No answer at this time, HIPPA appropriate voicemail left at this time requesting call back

## 2021-05-30 NOTE — BH Assessment (Addendum)
This writer attempted to assess patient for detox, patient denies that he wants any assistance with detox. Writer asked patient if he would like Outpatient resources, patient denied.  TTS Consult Completed

## 2021-05-30 NOTE — ED Notes (Signed)
Dr. Archie Balboa is with PT at this time

## 2021-05-30 NOTE — ED Notes (Signed)
Pt remains restless in bed, constantly moving around. CIWA assessment scored on patient at this time. Communicated with Dr. Archie Balboa questioning if withdrawal measures needed to be administered. States that at this time, he does not feel it needs to be activated but score can be calculated and continue to monitor patient. Will notifiy if any changes occur

## 2021-05-30 NOTE — ED Provider Notes (Signed)
[facility] Emergency Department Provider Note   ____________________________________________   I have reviewed the triage vital signs and the nursing notes.   HISTORY  Chief Complaint Alcohol Intoxication   History limited by: Intoxication   HPI Johnny Villa is a 45 y.o. male who presents to the emergency department today for apparent help with detox. However patient is quite intoxicated at this point and cannot give any significant history as to why he is here, nor can he say how we can help.   Records reviewed. Per medical record review patient has a history of drug use.   Past Medical History:  Diagnosis Date   Bipolar disorder [facility])    psychiatrist in [place]   Blood in stool    Depression    Drug abuse in remission [facility])    sober for 6 years but relapse 01/18/21 [facility] on ventilator for etoh/thc intoxication found with loc   Fatty liver 01/2018   H/O alcohol abuse    Headache    migraines   Hypertension    Low testosterone in male    Multifocal pneumonia    01/18/21 ? aspiration after thc and alcohol intoxication   PTSD (post-traumatic stress disorder)     Patient Active Problem List   Diagnosis Date Noted   Primary insomnia 02/24/2021   Oropharyngeal dysphagia 01/26/2021   Leukocytosis 01/26/2021   Intentional propranolol overdose ([place]) 01/26/2021   Hypertension, essential 01/26/2021   Anemia, iron deficiency 01/26/2021   No-show for appointment 01/23/2021   At risk for prolonged QT interval syndrome 12/21/2020   Bereavement 11/17/2020   Tendinitis of upper biceps tendon of right shoulder 10/10/2020   Superior labrum anterior-to-posterior (SLAP) tear of right shoulder 10/10/2020   Annual physical exam 08/05/2020   Lumbar radicular pain 12/28/2019   Bipolar disorder, in full remission, most recent episode depressed ([place]) 10/23/2019   Alcohol withdrawal ([place]) 09/10/2019   Suicide attempt ([place]) 09/10/2019   Bipolar 1 disorder  ([place]) 09/09/2019   Exercise-induced asthma 07/16/2019   Insomnia due to mental condition 07/09/2019   Alcohol use disorder, moderate, dependence ([place]) 07/09/2019   Cocaine use disorder, moderate, in sustained remission ([place]) 06/17/2019   Bipolar disorder, current episode depressed, moderate ([place]) 05/31/2019   Intentional drug overdose ([place])    PTSD (post-traumatic stress disorder) 05/04/2019   Alcohol use disorder, moderate, in early remission ([place]) 05/04/2019   Panic attacks 05/04/2019   Migraine 08/01/2018   Chronic migraine without aura without status migrainosus, not intractable 07/15/2018   Tremor 07/15/2018   Hyperlipidemia 06/20/2018   Cervical spondylosis without myelopathy (C4,5,6,7) 01/08/2018   Cervical facet joint syndrome 01/08/2018   DDD (degenerative disc disease), cervical 01/08/2018   Cervicalgia 11/28/2017   Chronic right shoulder pain 11/28/2017   Hypogonadism in male 10/29/2017   Low testosterone in male 10/29/2017   Erectile dysfunction 10/29/2017   Bipolar I disorder, most recent episode depressed ([place]) 10/29/2017    Past Surgical History:  Procedure Laterality Date   APPENDECTOMY     2006   COLONOSCOPY WITH PROPOFOL N/A 12/30/2017   Procedure: COLONOSCOPY WITH PROPOFOL;  Surgeon: Lin Landsman, MD;  Location: [facility] ENDOSCOPY;  Service: Gastroenterology;  Laterality: N/A;   ESOPHAGOGASTRODUODENOSCOPY (EGD) WITH PROPOFOL N/A 12/30/2017   Procedure: ESOPHAGOGASTRODUODENOSCOPY (EGD) WITH PROPOFOL;  Surgeon: Lin Landsman, MD;  Location: [facility] ENDOSCOPY;  Service: Gastroenterology;  Laterality: N/A;    Prior to Admission medications   Medication Sig Start Date End Date Taking? Authorizing Provider  ACIDOPHILUS  LACTOBACILLUS PO Take 1 capsule by mouth daily.    [provider]  albuterol (VENTOLIN HFA) 108 (90 Base) MCG/ACT inhaler Inhale 2 puffs into the lungs every 6 (six) hours as needed for wheezing or shortness of breath. 03/03/21    McLean-Scocuzza, Nino Glow, MD  amLODipine (NORVASC) 10 MG tablet Take 1 tablet (10 mg total) by mouth daily. 02/21/21 02/21/22  McLean-Scocuzza, Nino Glow, MD  busPIRone (BUSPAR) 30 MG tablet Take 1 tablet (30 mg total) by mouth in the morning and at bedtime. 11/22/20   Ursula Alert, MD  clomiPHENE (CLOMID) 50 MG tablet Take 25 mg by mouth daily. 06/04/19   [provider]  folic acid (FOLVITE) A999333 MCG tablet Take by mouth. 02/01/21   [provider]  gabapentin (NEURONTIN) 600 MG tablet Take 1 tablet (600 mg total) by mouth 3 (three) times daily. 02/07/21 08/06/21  Gillis Santa, MD  lamoTRIgine (LAMICTAL) 100 MG tablet Take 1 tablet (100 mg total) by mouth daily. Take along with 25 mg daily 03/06/21   Ursula Alert, MD  lamoTRIgine (LAMICTAL) 25 MG tablet Take 1 tablet (25 mg total) by mouth daily. Take along with 100 mg daily 02/09/21   Ursula Alert, MD  Multiple Vitamin (MULTI-VITAMIN) tablet Take 1 tablet by mouth daily.    [provider]  mupirocin ointment (BACTROBAN) 2 % Apply 1 application topically 2 (two) times daily. 02/09/21   McLean-Scocuzza, Nino Glow, MD  Naproxen Sodium 220 MG CAPS Take by mouth.    [provider]  OLANZapine (ZYPREXA) 5 MG tablet Take 1 tablet (5 mg total) by mouth at bedtime. 03/06/21   Ursula Alert, MD  ondansetron (ZOFRAN-ODT) 4 MG disintegrating tablet Take 2 tablets (8 mg total) by mouth every 8 (eight) hours as needed. 02/21/21   McLean-Scocuzza, Nino Glow, MD  pantoprazole (PROTONIX) 40 MG tablet Take 1 tablet (40 mg total) by mouth daily. 02/21/21 11/18/21  McLean-Scocuzza, Nino Glow, MD  sildenafil (REVATIO) 20 MG tablet Take 2 tablets (40 mg total) by mouth daily in the afternoon. 03/03/21   McLean-Scocuzza, Nino Glow, MD  SUMAtriptan (IMITREX) 100 MG tablet Take 1 tablet (100 mg total) by mouth every 2 (two) hours as needed. Max dose 200 mg/24 hours. Limit to 2x per week 02/21/21   McLean-Scocuzza, Nino Glow, MD  zolpidem (AMBIEN) 10 MG  tablet Take 1 tablet (10 mg total) by mouth at bedtime as needed for sleep. 05/11/21   McLean-Scocuzza, Nino Glow, MD    Allergies Patient has no known allergies.  Family History  Problem Relation Age of Onset   Depression Mother    Alcohol abuse Father    Cancer Father        prostate dx'ed 69   COPD Father    Hyperlipidemia Father    Hypertension Father     Social History Social History   Tobacco Use   Smoking status: Former    Packs/day: 0.25    Years: 2.00    Pack years: 0.50    Types: Cigarettes    Quit date: 1999    Years since quitting: 23.6   Smokeless tobacco: Former    Types: Nurse, children's Use: Former  Substance Use Topics   Alcohol use: Yes    Comment: former quit 03/14/2012   Drug use: Not Currently    Comment: former quit cocaine 03/14/2012     Review of Systems Unable to obtain ROS secondary to intoxication.  ____________________________________________   PHYSICAL EXAM:  VITAL SIGNS: ED Triage Vitals  Enc Vitals Group     BP 05/30/21 2004 (!) 122/96     Pulse Rate 05/30/21 2004 (!) 114     Resp 05/30/21 2004 18     Temp 05/30/21 2004 98.5 F (36.9 C)     Temp Source 05/30/21 2004 Oral     SpO2 05/30/21 2004 96 %     Weight 05/30/21 2001 224 lb 13.9 oz (102 kg)     Height 05/30/21 2001 '5\' 11"'$  (1.803 m)     Head Circumference --      Peak Flow --      Pain Score 05/30/21 2001 0     Pain Loc --   Constitutional: Alert and oriented.  Eyes: Conjunctivae are normal.  ENT      Head: Normocephalic and atraumatic.      Nose: No congestion/rhinnorhea.      Mouth/Throat: Mucous membranes are moist.      Neck: No stridor. Hematological/Lymphatic/Immunilogical: No cervical lymphadenopathy. Cardiovascular: Normal rate, regular rhythm.  No murmurs, rubs, or gallops.  Respiratory: Normal respiratory effort without tachypnea nor retractions. Breath sounds are clear and equal bilaterally. No wheezes/rales/rhonchi. Gastrointestinal: Soft and  non tender. No rebound. No guarding.  Genitourinary: Deferred Musculoskeletal: Normal range of motion in all extremities. No lower extremity edema. Neurologic:  Awake. Intoxicated. Moving all extremities.  Skin:  Skin is warm, dry and intact. No rash noted. Psychiatric: Calm  ____________________________________________    LABS (pertinent positives/negatives)  CMP na 135, k 3.7, glu 183, cr 1.00, ast 216, alt 142, t bili 2.2 CBC wbc 6.1, hgb 17.6, plt 228 Ethanol 455 ____________________________________________   EKG  None  ____________________________________________    RADIOLOGY  None  ____________________________________________   PROCEDURES  Procedures  ____________________________________________   INITIAL IMPRESSION / ASSESSMENT AND PLAN / ED COURSE  Pertinent labs & imaging results that were available during my care of the patient were reviewed by me and considered in my medical decision making (see chart for details).   Patient presents to the emergency department today acutely intoxicated.  Patient cannot give any significant history as to why he came today however according to the triage note there is some desire for detox.  Will plan on observation for sobriety and evaluation in the morning for possible detox.   ____________________________________________   FINAL CLINICAL IMPRESSION(S) / ED DIAGNOSES  Final diagnoses:  Alcohol abuse     Note: This dictation was prepared with Dragon dictation. Any transcriptional errors that result from this process are unintentional     Nance Pear, MD 05/30/21 2157

## 2021-05-31 ENCOUNTER — Emergency Department: Payer: Medicare Other

## 2021-05-31 ENCOUNTER — Other Ambulatory Visit: Payer: Self-pay

## 2021-05-31 ENCOUNTER — Encounter: Payer: Self-pay | Admitting: Emergency Medicine

## 2021-05-31 ENCOUNTER — Emergency Department
Admission: EM | Admit: 2021-05-31 | Discharge: 2021-05-31 | Disposition: A | Discharge status: Home / Self Care | Payer: Medicare Other | Source: Home / Self Care | Attending: Emergency Medicine | Admitting: Emergency Medicine

## 2021-05-31 DIAGNOSIS — R000 Tachycardia, unspecified: Secondary | ICD-10-CM | POA: Insufficient documentation

## 2021-05-31 DIAGNOSIS — K2970 Gastritis, unspecified, without bleeding: Secondary | ICD-10-CM

## 2021-05-31 DIAGNOSIS — R0789 Other chest pain: Secondary | ICD-10-CM | POA: Insufficient documentation

## 2021-05-31 DIAGNOSIS — Z20822 Contact with and (suspected) exposure to covid-19: Secondary | ICD-10-CM | POA: Insufficient documentation

## 2021-05-31 DIAGNOSIS — R079 Chest pain, unspecified: Secondary | ICD-10-CM

## 2021-05-31 DIAGNOSIS — Y908 Blood alcohol level of 240 mg/100 ml or more: Secondary | ICD-10-CM | POA: Insufficient documentation

## 2021-05-31 DIAGNOSIS — R197 Diarrhea, unspecified: Secondary | ICD-10-CM | POA: Insufficient documentation

## 2021-05-31 DIAGNOSIS — Z87891 Personal history of nicotine dependence: Secondary | ICD-10-CM | POA: Insufficient documentation

## 2021-05-31 DIAGNOSIS — E860 Dehydration: Secondary | ICD-10-CM

## 2021-05-31 DIAGNOSIS — F1010 Alcohol abuse, uncomplicated: Secondary | ICD-10-CM

## 2021-05-31 DIAGNOSIS — I10 Essential (primary) hypertension: Secondary | ICD-10-CM | POA: Insufficient documentation

## 2021-05-31 DIAGNOSIS — Z79899 Other long term (current) drug therapy: Secondary | ICD-10-CM | POA: Insufficient documentation

## 2021-05-31 DIAGNOSIS — K2920 Alcoholic gastritis without bleeding: Secondary | ICD-10-CM | POA: Diagnosis not present

## 2021-05-31 LAB — CBC
HCT: 45.5 % (ref 39.0–52.0)
Hemoglobin: 16.7 g/dL (ref 13.0–17.0)
MCH: 32.3 pg (ref 26.0–34.0)
MCHC: 36.7 g/dL — ABNORMAL HIGH (ref 30.0–36.0)
MCV: 88 fL (ref 80.0–100.0)
Platelets: 213 10*3/uL (ref 150–400)
RBC: 5.17 MIL/uL (ref 4.22–5.81)
RDW: 11.8 % (ref 11.5–15.5)
WBC: 8.7 10*3/uL (ref 4.0–10.5)
nRBC: 0 % (ref 0.0–0.2)

## 2021-05-31 LAB — RESP PANEL BY RT-PCR (FLU A&B, COVID) ARPGX2
Influenza A by PCR: NEGATIVE
Influenza B by PCR: NEGATIVE
SARS Coronavirus 2 by RT PCR: NEGATIVE

## 2021-05-31 LAB — URINE DRUG SCREEN, QUALITATIVE (ARMC ONLY)
Amphetamines, Ur Screen: NOT DETECTED
Amphetamines, Ur Screen: NOT DETECTED
Barbiturates, Ur Screen: NOT DETECTED
Barbiturates, Ur Screen: NOT DETECTED
Benzodiazepine, Ur Scrn: NOT DETECTED
Benzodiazepine, Ur Scrn: NOT DETECTED
Cannabinoid 50 Ng, Ur ~~LOC~~: NOT DETECTED
Cannabinoid 50 Ng, Ur ~~LOC~~: NOT DETECTED
Cocaine Metabolite,Ur ~~LOC~~: NOT DETECTED
Cocaine Metabolite,Ur ~~LOC~~: NOT DETECTED
MDMA (Ecstasy)Ur Screen: NOT DETECTED
MDMA (Ecstasy)Ur Screen: NOT DETECTED
Methadone Scn, Ur: NOT DETECTED
Methadone Scn, Ur: NOT DETECTED
Opiate, Ur Screen: NOT DETECTED
Opiate, Ur Screen: NOT DETECTED
Phencyclidine (PCP) Ur S: NOT DETECTED
Phencyclidine (PCP) Ur S: NOT DETECTED
Tricyclic, Ur Screen: NOT DETECTED
Tricyclic, Ur Screen: NOT DETECTED

## 2021-05-31 LAB — ETHANOL: Alcohol, Ethyl (B): 394 mg/dL (ref <10)

## 2021-05-31 LAB — BASIC METABOLIC PANEL
Anion gap: 13 (ref 5–15)
BUN: 17 mg/dL (ref 6–20)
CO2: 24 mmol/L (ref 22–32)
Calcium: 8 mg/dL — ABNORMAL LOW (ref 8.9–10.3)
Chloride: 94 mmol/L — ABNORMAL LOW (ref 98–111)
Creatinine, Ser: 0.97 mg/dL (ref 0.61–1.24)
GFR, Estimated: >60 mL/min (ref >60)
Glucose, Bld: 139 mg/dL — ABNORMAL HIGH (ref 70–99)
Potassium: 3.4 mmol/L — ABNORMAL LOW (ref 3.5–5.1)
Sodium: 131 mmol/L — ABNORMAL LOW (ref 135–145)

## 2021-05-31 LAB — ACETAMINOPHEN LEVEL: Acetaminophen (Tylenol), Serum: <10 ug/mL — ABNORMAL LOW (ref 10–30)

## 2021-05-31 LAB — SALICYLATE LEVEL: Salicylate Lvl: <7 mg/dL — ABNORMAL LOW (ref 7.0–30.0)

## 2021-05-31 LAB — TROPONIN I (HIGH SENSITIVITY)
Troponin I (High Sensitivity): 14 ng/L (ref <18)
Troponin I (High Sensitivity): 13 ng/L (ref <18)

## 2021-05-31 LAB — MAGNESIUM: Magnesium: 2.1 mg/dL (ref 1.7–2.4)

## 2021-05-31 MED ORDER — LORAZEPAM 2 MG/ML IJ SOLN: 0.0000 mg | Freq: Two times a day (BID) | INTRAMUSCULAR | Status: DC

## 2021-05-31 MED ORDER — CHLORDIAZEPOXIDE HCL 25 MG PO CAPS
50.0000 mg | ORAL_CAPSULE | Freq: Once | ORAL | Status: AC
  Administered 2021-05-31: 50 mg via ORAL
  Filled 2021-05-31: qty 2

## 2021-05-31 MED ORDER — POTASSIUM CHLORIDE CRYS ER 20 MEQ PO TBCR
40.0000 meq | EXTENDED_RELEASE_TABLET | Freq: Once | ORAL | Status: AC
  Administered 2021-05-31: 40 meq via ORAL
  Filled 2021-05-31: qty 2

## 2021-05-31 MED ORDER — LACTATED RINGERS IV BOLUS
1000.0000 mL | Freq: Once | INTRAVENOUS | Status: AC
  Administered 2021-05-31: 1000 mL via INTRAVENOUS

## 2021-05-31 MED ORDER — LORAZEPAM 2 MG/ML IJ SOLN
2.0000 mg | Freq: Once | INTRAMUSCULAR | Status: AC
  Administered 2021-05-31: 2 mg via INTRAVENOUS

## 2021-05-31 MED ORDER — LORAZEPAM 2 MG/ML IJ SOLN: 0.0000 mg | Freq: Four times a day (QID) | INTRAMUSCULAR | Status: DC

## 2021-05-31 MED ORDER — LORAZEPAM 2 MG PO TABS: 0.0000 mg | ORAL_TABLET | Freq: Two times a day (BID) | ORAL | Status: DC

## 2021-05-31 MED ORDER — LORAZEPAM 2 MG/ML IJ SOLN
0.0000 mg | Freq: Four times a day (QID) | INTRAMUSCULAR | Status: DC
  Administered 2021-05-31: 2 mg via INTRAVENOUS
  Filled 2021-05-31 (×2): qty 1

## 2021-05-31 MED ORDER — THIAMINE HCL 100 MG/ML IJ SOLN: 100.0000 mg | Freq: Every day | INTRAMUSCULAR | Status: DC

## 2021-05-31 MED ORDER — LORAZEPAM 2 MG PO TABS
0.0000 mg | ORAL_TABLET | Freq: Four times a day (QID) | ORAL | Status: DC
  Administered 2021-05-31 (×2): 2 mg via ORAL
  Filled 2021-05-31 (×2): qty 1

## 2021-05-31 MED ORDER — CHLORDIAZEPOXIDE HCL 25 MG PO CAPS: ORAL_CAPSULE | ORAL | 0 refills | Status: AC

## 2021-05-31 MED ORDER — OLANZAPINE 5 MG PO TABS
5.0000 mg | ORAL_TABLET | Freq: Every day | ORAL | Status: DC
  Administered 2021-05-31: 5 mg via ORAL
  Filled 2021-05-31: qty 1

## 2021-05-31 MED ORDER — LORAZEPAM 2 MG PO TABS: 0.0000 mg | ORAL_TABLET | Freq: Four times a day (QID) | ORAL | Status: DC

## 2021-05-31 MED ORDER — THIAMINE HCL 100 MG/ML IJ SOLN
100.0000 mg | Freq: Every day | INTRAMUSCULAR | Status: DC
  Administered 2021-05-31: 100 mg via INTRAVENOUS
  Filled 2021-05-31: qty 2

## 2021-05-31 MED ORDER — ONDANSETRON HCL 4 MG PO TABS
4.0000 mg | ORAL_TABLET | Freq: Three times a day (TID) | ORAL | Status: DC | PRN
  Administered 2021-05-31: 4 mg via ORAL
  Filled 2021-05-31: qty 1

## 2021-05-31 MED ORDER — LORAZEPAM 2 MG PO TABS
0.0000 mg | ORAL_TABLET | Freq: Two times a day (BID) | ORAL | Status: DC
Start: 2021-06-03 — End: 2021-06-01

## 2021-05-31 MED ORDER — LAMOTRIGINE 25 MG PO TABS
25.0000 mg | ORAL_TABLET | Freq: Every day | ORAL | Status: DC
  Administered 2021-05-31: 25 mg via ORAL
  Filled 2021-05-31: qty 1

## 2021-05-31 MED ORDER — THIAMINE HCL 100 MG PO TABS: 100.0000 mg | ORAL_TABLET | Freq: Every day | ORAL | Status: DC

## 2021-05-31 NOTE — ED Notes (Signed)
Pt complains of increased anxiety. Requests ativan. Reports that he has never had a seizure due to alcohol withdrawal. States worsening anxiety in last 2 hours. No nausea at this time.

## 2021-05-31 NOTE — ED Notes (Signed)
Pt given sandwich tray 

## 2021-05-31 NOTE — ED Notes (Signed)
Call [place] (girlfriend) or [place] (friend) if patient decides to leave/walks out.

## 2021-05-31 NOTE — ED Notes (Signed)
Pt awake, a/o, able to ambulate independently, wants to be discharged.

## 2021-05-31 NOTE — ED Notes (Signed)
Pt noted to be sitting with friend in lobby.

## 2021-05-31 NOTE — ED Notes (Signed)
Pt given urinal upon request 

## 2021-05-31 NOTE — ED Notes (Signed)
Pt is now repeatedly asking for ativan at this time. Educated on treatment plan

## 2021-05-31 NOTE — ED Notes (Signed)
Pt's friend walked pt back in.

## 2021-05-31 NOTE — ED Provider Notes (Signed)
-----------------------------------------   12:21 AM on 05/31/2021 -----------------------------------------  Assuming care from Dr. Archie Balboa.  In short, Johnny Villa is a 45 y.o. male with a chief complaint of alcohol intoxication and chronic abuse.  Refer to the original H&P for additional details.  In short, the patient is refusing detox and does not want any sort of outpatient placement for alcohol abuse.  Dr. Archie Balboa intended to discharge the patient but his girlfriend is refusing to pick him up and will wants him to stay "for 24 hours".  It was explained to her that that is not possible but she will not pick him up and he has no way to get home and no safe and sober adult with whom he can stay.  For the patient's safety, I will keep him here so that he can be watched overnight in a safe environment.  I am putting him on CIWA protocol because in spite of his initially very high alcohol level he is starting to show some signs of withdrawal including some mild tachycardia, feelings of anxiety, and mild tremor.  At this point he should be considered psychiatric observation.  The patient has been placed in psychiatric observation due to the need to provide a safe environment for the patient while obtaining psychiatric consultation and evaluation, as well as ongoing medical and medication management to treat the patient's condition.  The patient has not been placed under full IVC at this time.   ----------------------------------------- 8:08 AM on 05/31/2021 -----------------------------------------  The patient was stable overnight.  The girlfriend said that she should be able to pick him up after 7 AM this morning.  He has been monitored carefully via CIWA protocol and there are no concerning signs or symptoms.  He is appropriate for discharge and outpatient follow-up and he continues to refuse additional treatment options for his alcohol abuse.  I provided information about [facility] and [facility].  I gave my  usual and customary return precautions.   Hinda Kehr, MD 05/31/21 [phone]

## 2021-05-31 NOTE — ED Notes (Signed)
Pt found using his vape in room; informed pt he could not vape while in ED; pt verbalized understanding

## 2021-05-31 NOTE — ED Triage Notes (Addendum)
Pt via POV from home. Pt is accompanied by friend. Pt was seen and d/c this AM. Pt is c/o centralized CP and SOB for 3 days. Pt is intoxicated, pt had 9 beers today. Last drink was around 3:00pm. Denies any drug use. Denies AVH. Denies SI/HI. Pt is A&Ox4 and NAD.

## 2021-05-31 NOTE — ED Notes (Signed)
Pt noted to be walking out of lobby. Multiple attempts by staff to keep patient here. Pt ignored requests. Pt stable walking. Pt's friend called and notified that pt was walking out.

## 2021-05-31 NOTE — ED Notes (Signed)
Pt states he has been drinking beer and liquor throughout the day, is unsure how much; pt very diaphoretic and unable to stay still

## 2021-05-31 NOTE — ED Provider Notes (Addendum)
[facility] Emergency Department Provider Note  ____________________________________________   Event Date/Time   First MD Initiated Contact with Patient 05/31/21 1924     (approximate)  I have reviewed the triage vital signs and the nursing notes.   HISTORY  Chief Complaint Chest Pain and Alcohol Intoxication   HPI Johnny Villa is a 45 y.o. male past medical history of bipolar disorder, depression, HTN, PTSD, and alcohol abuse most recently evaluated emergency room yesterday discharged earlier today requesting at that time some detox but having changed his mind said he has no longer interested in help with his alcohol abuse who presents to emergency room stating that now he feels he needs help with alcohol abuse.  States he also developed some chest discomfort today.  He is not sure when his last drink was.  He is not sure what he last had to drink.  He denies any illicit drug use SI HI or hallucinations.  He does state he has had some nausea and vomiting as well some diarrhea today.  He denies any abdominal pain, back pain, headache or earache, sore throat, hallucinations, rash or recent injuries or falls.  Denies any other illicit drug use.  Denies any other acute concerns at this time         Past Medical History:  Diagnosis Date   Bipolar disorder [facility])    psychiatrist in [place]   Blood in stool    Depression    Drug abuse in remission [facility])    sober for 6 years but relapse 01/18/21 [facility] on ventilator for etoh/thc intoxication found with loc   Fatty liver 01/2018   H/O alcohol abuse    Headache    migraines   Hypertension    Low testosterone in male    Multifocal pneumonia    01/18/21 ? aspiration after thc and alcohol intoxication   PTSD (post-traumatic stress disorder)     Patient Active Problem List   Diagnosis Date Noted   Primary insomnia 02/24/2021   Oropharyngeal dysphagia 01/26/2021   Leukocytosis 01/26/2021   Intentional  propranolol overdose ([place]) 01/26/2021   Hypertension, essential 01/26/2021   Anemia, iron deficiency 01/26/2021   No-show for appointment 01/23/2021   At risk for prolonged QT interval syndrome 12/21/2020   Bereavement 11/17/2020   Tendinitis of upper biceps tendon of right shoulder 10/10/2020   Superior labrum anterior-to-posterior (SLAP) tear of right shoulder 10/10/2020   Annual physical exam 08/05/2020   Lumbar radicular pain 12/28/2019   Bipolar disorder, in full remission, most recent episode depressed ([place]) 10/23/2019   Alcohol withdrawal ([place]) 09/10/2019   Suicide attempt ([place]) 09/10/2019   Bipolar 1 disorder ([place]) 09/09/2019   Exercise-induced asthma 07/16/2019   Insomnia due to mental condition 07/09/2019   Alcohol use disorder, moderate, dependence ([place]) 07/09/2019   Cocaine use disorder, moderate, in sustained remission ([place]) 06/17/2019   Bipolar disorder, current episode depressed, moderate ([place]) 05/31/2019   Intentional drug overdose ([place])    PTSD (post-traumatic stress disorder) 05/04/2019   Alcohol use disorder, moderate, in early remission ([place]) 05/04/2019   Panic attacks 05/04/2019   Migraine 08/01/2018   Chronic migraine without aura without status migrainosus, not intractable 07/15/2018   Tremor 07/15/2018   Hyperlipidemia 06/20/2018   Cervical spondylosis without myelopathy (C4,5,6,7) 01/08/2018   Cervical facet joint syndrome 01/08/2018   DDD (degenerative disc disease), cervical 01/08/2018   Cervicalgia 11/28/2017   Chronic right shoulder pain 11/28/2017   Hypogonadism in male 10/29/2017  Low testosterone in male 10/29/2017   Erectile dysfunction 10/29/2017   Bipolar I disorder, most recent episode depressed ([place]) 10/29/2017    Past Surgical History:  Procedure Laterality Date   APPENDECTOMY     2006   COLONOSCOPY WITH PROPOFOL N/A 12/30/2017   Procedure: COLONOSCOPY WITH PROPOFOL;  Surgeon: Lin Landsman, MD;  Location: [facility] ENDOSCOPY;  Service:  Gastroenterology;  Laterality: N/A;   ESOPHAGOGASTRODUODENOSCOPY (EGD) WITH PROPOFOL N/A 12/30/2017   Procedure: ESOPHAGOGASTRODUODENOSCOPY (EGD) WITH PROPOFOL;  Surgeon: Lin Landsman, MD;  Location[facility] ENDOSCOPY;  Service: Gastroenterology;  Laterality: N/A;    Prior to Admission medications   Medication Sig Start Date End Date Taking? Authorizing Provider  chlordiazePOXIDE (LIBRIUM) 25 MG capsule Take 2 capsules (50 mg total) by mouth every 6 (six) hours for 1 day, THEN 1 capsule (25 mg total) every 6 (six) hours for 1 day, THEN 1 capsule (25 mg total) every 12 (twelve) hours for 1 day, THEN 1 capsule (25 mg total) at bedtime for 1 day. 05/31/21 06/04/21 Yes Lucrezia Starch, MD  ACIDOPHILUS LACTOBACILLUS PO Take 1 capsule by mouth daily.    [provider]  albuterol (VENTOLIN HFA) 108 (90 Base) MCG/ACT inhaler Inhale 2 puffs into the lungs every 6 (six) hours as needed for wheezing or shortness of breath. 03/03/21   McLean-Scocuzza, Nino Glow, MD  amLODipine (NORVASC) 10 MG tablet Take 1 tablet (10 mg total) by mouth daily. 02/21/21 02/21/22  McLean-Scocuzza, Nino Glow, MD  buPROPion (WELLBUTRIN XL) 300 MG 24 hr tablet Take 300 mg by mouth daily. 02/09/21   [provider]  busPIRone (BUSPAR) 30 MG tablet Take 1 tablet (30 mg total) by mouth in the morning and at bedtime. 11/22/20   Ursula Alert, MD  clomiPHENE (CLOMID) 50 MG tablet Take 25 mg by mouth daily. 06/04/19   [provider]  clomiPRAMINE (ANAFRANIL) 25 MG capsule Take 25 mg by mouth at bedtime. 05/14/21   [provider]  folic acid (FOLVITE) A999333 MCG tablet Take by mouth. 02/01/21   [provider]  gabapentin (NEURONTIN) 600 MG tablet Take 1 tablet (600 mg total) by mouth 3 (three) times daily. 02/07/21 08/06/21  Gillis Santa, MD  lamoTRIgine (LAMICTAL) 100 MG tablet Take 1 tablet (100 mg total) by mouth daily. Take along with 25 mg daily 03/06/21   Ursula Alert, MD  lamoTRIgine (LAMICTAL) 200  MG tablet Take 200 mg by mouth daily. 04/18/21   [provider]  lamoTRIgine (LAMICTAL) 25 MG tablet Take 1 tablet (25 mg total) by mouth daily. Take along with 100 mg daily 02/09/21   Ursula Alert, MD  mirtazapine (REMERON) 30 MG tablet Take 30 mg by mouth at bedtime. 05/07/21   [provider]  Multiple Vitamin (MULTI-VITAMIN) tablet Take 1 tablet by mouth daily.    [provider]  mupirocin ointment (BACTROBAN) 2 % Apply 1 application topically 2 (two) times daily. 02/09/21   McLean-Scocuzza, Nino Glow, MD  Naproxen Sodium 220 MG CAPS Take by mouth.    [provider]  OLANZapine (ZYPREXA) 5 MG tablet Take 1 tablet (5 mg total) by mouth at bedtime. 03/06/21   Ursula Alert, MD  pantoprazole (PROTONIX) 40 MG tablet Take 1 tablet (40 mg total) by mouth daily. 02/21/21 11/18/21  McLean-Scocuzza, Nino Glow, MD  sildenafil (REVATIO) 20 MG tablet Take 2 tablets (40 mg total) by mouth daily in the afternoon. 03/03/21   McLean-Scocuzza, Nino Glow, MD  SUMAtriptan (IMITREX) 100 MG tablet Take  1 tablet (100 mg total) by mouth every 2 (two) hours as needed. Max dose 200 mg/24 hours. Limit to 2x per week 02/21/21   McLean-Scocuzza, Nino Glow, MD  zolpidem (AMBIEN) 10 MG tablet Take 1 tablet (10 mg total) by mouth at bedtime as needed for sleep. 05/11/21   McLean-Scocuzza, Nino Glow, MD    Allergies Patient has no known allergies.  Family History  Problem Relation Age of Onset   Depression Mother    Alcohol abuse Father    Cancer Father        prostate dx'ed 39   COPD Father    Hyperlipidemia Father    Hypertension Father     Social History Social History   Tobacco Use   Smoking status: Former    Packs/day: 0.25    Years: 2.00    Pack years: 0.50    Types: Cigarettes    Quit date: 1999    Years since quitting: 23.6   Smokeless tobacco: Former    Types: Nurse, children's Use: Former  Substance Use Topics   Alcohol use: Yes    Comment: former quit 03/14/2012    Drug use: Not Currently    Comment: former quit cocaine 03/14/2012     Review of Systems  Review of Systems  Constitutional:  Negative for chills and fever.  HENT:  Negative for sore throat.   Eyes:  Negative for pain.  Respiratory:  Negative for cough and stridor.   Cardiovascular:  Negative for chest pain.  Gastrointestinal:  Positive for diarrhea, nausea and vomiting.  Genitourinary:  Negative for dysuria.  Musculoskeletal:  Negative for myalgias.  Skin:  Negative for rash.  Neurological:  Negative for seizures, loss of consciousness and headaches.  Psychiatric/Behavioral:  Positive for memory loss and substance abuse. Negative for suicidal ideas. The patient is nervous/anxious.   All other systems reviewed and are negative.    ____________________________________________   PHYSICAL EXAM:  VITAL SIGNS: ED Triage Vitals  Enc Vitals Group     BP 05/31/21 1530 125/75     Pulse Rate 05/31/21 1530 (!) 115     Resp 05/31/21 1530 20     Temp 05/31/21 1530 98.5 F (36.9 C)     Temp Source 05/31/21 1530 Oral     SpO2 05/31/21 1530 95 %     Weight 05/31/21 1523 220 lb (99.8 kg)     Height 05/31/21 1523 '5\' 11"'$  (1.803 m)     Head Circumference --      Peak Flow --      Pain Score 05/31/21 1522 9     Pain Loc --      Pain Edu? --      Excl. in [place]? --    Vitals:   05/31/21 2130 05/31/21 2145  BP: (!) 135/113 (!) 135/113  Pulse:  (!) 103  Resp:  18  Temp:  98.4 F (36.9 C)  SpO2:  95%   Physical Exam Vitals and nursing note reviewed.  Constitutional:      General: He is in acute distress.     Appearance: He is well-developed. He is ill-appearing and diaphoretic.  HENT:     Head: Normocephalic and atraumatic.     Right Ear: External ear normal.     Left Ear: External ear normal.     Nose: Nose normal.     Mouth/Throat:     Mouth: Mucous membranes are moist.  Eyes:     Conjunctiva/sclera: Conjunctivae  normal.  Cardiovascular:     Rate and Rhythm: Regular  rhythm. Tachycardia present.     Heart sounds: No murmur heard. Pulmonary:     Effort: Pulmonary effort is normal. No respiratory distress.     Breath sounds: Normal breath sounds.  Abdominal:     Palpations: Abdomen is soft.     Tenderness: There is no abdominal tenderness.  Musculoskeletal:     Cervical back: Neck supple.  Skin:    General: Skin is warm.     Capillary Refill: Capillary refill takes less than 2 seconds.  Neurological:     Mental Status: He is alert.     Motor: Tremor present.  Psychiatric:        Mood and Affect: Mood is anxious.        Thought Content: Thought content does not include homicidal or suicidal ideation.        Cognition and Memory: Memory is impaired. He exhibits impaired recent memory.     ____________________________________________   LABS (all labs ordered are listed, but only abnormal results are displayed)  Labs Reviewed  BASIC METABOLIC PANEL - Abnormal; Notable for the following components:      Result Value   Sodium 131 (*)    Potassium 3.4 (*)    Chloride 94 (*)    Glucose, Bld 139 (*)    Calcium 8.0 (*)    All other components within normal limits  CBC - Abnormal; Notable for the following components:   MCHC 36.7 (*)    All other components within normal limits  ETHANOL - Abnormal; Notable for the following components:   Alcohol, Ethyl (B) 394 (*)    All other components within normal limits  SALICYLATE LEVEL - Abnormal; Notable for the following components:   Salicylate Lvl [id number] (*)    All other components within normal limits  ACETAMINOPHEN LEVEL - Abnormal; Notable for the following components:   Acetaminophen (Tylenol), Serum <10 (*)    All other components within normal limits  RESP PANEL BY RT-PCR (FLU A&B, COVID) ARPGX2  URINE DRUG SCREEN, QUALITATIVE ([facility] ONLY)  MAGNESIUM  TROPONIN I (HIGH SENSITIVITY)  TROPONIN I (HIGH SENSITIVITY)   ____________________________________________  EKG  Sinus tachycardia with  ventricular rate of 119 with right axis deviation and no other clear evidence of acute ischemia or significant arrhythmia. ____________________________________________  RADIOLOGY  ED MD interpretation: Chest X-ray shows no focal consolidation, effusion, edema, pneumothorax or any other clear acute intrathoracic process.   Official radiology report(s): DG Chest 2 View  Result Date: 05/31/2021 CLINICAL DATA:  Chest pain EXAM: CHEST - 2 VIEW COMPARISON:  Chest x-ray dated March 02, 2021 FINDINGS: The heart size and mediastinal contours are within normal limits. Both lungs are clear. The visualized skeletal structures are unremarkable. IMPRESSION: No active cardiopulmonary disease. Electronically Signed   By: Yetta Glassman M.D.   On: 05/31/2021 16:22    ____________________________________________   PROCEDURES  Procedure(s) performed (including Critical Care):  Procedures   ____________________________________________   INITIAL IMPRESSION / ASSESSMENT AND PLAN / ED COURSE        Patient presents with above-stated history exam for assessment of alcohol abuse stating he is already to get some help with this as well some chest comfort he developed today.  States he has developed some nausea vomiting diarrhea and is worried he may be starting to get the early withdrawal.  He has been drinking throughout the day but is not sure what or when his last drink  was.  He denies any other acute sick symptoms.  He denies any SI HI or hallucinations.  On arrival he is tachycardic with otherwise stable vital signs on room air.  On exam he is diaphoretic ill-appearing and tremulous.  With regard to his chest discomfort differential includes esophagitis and gastritis from EtOH abuse, ACS, arrhythmia, spontaneous pneumothorax, ruptured esophagus  Chest x-ray has no evidence of pneumomediastinum, focal consolidation, pneumothorax, effusion, edema or other acute thoracic process.  ECG has nonspecific  findings but given nonelevated troponin x2 have low suspicion for ACS or myocarditis.  BMP remarkable for K of 3.4 without any other significant electrolyte or metabolic derangements.  CBC shows no leukocytosis or acute anemia.  Serum acetaminophen and salicylate levels undetectable.  UDS is negative.  Serum ethanol obtained at 3:29 PM elevated at 394.  Serum ethanol obtained several hours prior to my assessment.  My assessment given overall tachycardia tremulousness nausea vomiting diaphoresis I am concerned patient is in alcohol withdrawal.  Low suspicion for other immediate life-threatening chest pain at this time.  He was placed on CIWA protocol and given some fluids and potassium.  He is subsequently given additional 2 mg of Ativan for some persistent tremulousness and tachycardia.  He states he does not really want help for his alcohol use.  I will place him in psychology consult TTS.  He will be maintained on CIWA overnight.  The patient has been placed in psychiatric observation due to the need to provide a safe environment for the patient while obtaining TTS consultation and evaluation, as well as ongoing medical and medication management to treat the patient's condition.  The patient has not been placed under full IVC at this time.  Patient was seen by TTS.  Had extensive discussion and I think patient does not wish to pursue inpatient detox at this time.  On my reassessment he states he strongly wishes to pursue outpatient therapy and is amenable to starting on Librium.  He was given additional dose and states he is feeling much better.  His heart rate is improved from 1 15-1 03.  He is less tremulous.  I think this is reasonable as patient states he strongly wishes to be discharged although I did offer further observation emergency room where he can continue to receive scheduled CIWA as indicated by his CIWA scores but he states he wishes to pursue outpatient therapy.  Rx for Librium written.   Discharged stable condition.  Strict return precautions provided and discussed.    ____________________________________________   FINAL CLINICAL IMPRESSION(S) / ED DIAGNOSES  Final diagnoses:  Chest pain, unspecified type  Alcohol abuse  Dehydration  Gastritis without bleeding, unspecified chronicity, unspecified gastritis type    Medications  ondansetron (ZOFRAN) tablet 4 mg (4 mg Oral Given 05/31/21 2018)  LORazepam (ATIVAN) injection 0-4 mg (2 mg Intravenous Given 05/31/21 2012)    Or  LORazepam (ATIVAN) tablet 0-4 mg ( Oral See Alternative 05/31/21 2012)  LORazepam (ATIVAN) injection 0-4 mg (has no administration in time range)    Or  LORazepam (ATIVAN) tablet 0-4 mg (has no administration in time range)  thiamine tablet 100 mg ( Oral See Alternative 05/31/21 2017)    Or  thiamine (B-1) injection 100 mg (100 mg Intravenous Given 05/31/21 2017)  lamoTRIgine (LAMICTAL) tablet 25 mg (25 mg Oral Given 05/31/21 2018)  OLANZapine (ZYPREXA) tablet 5 mg (5 mg Oral Given 05/31/21 2115)  chlordiazePOXIDE (LIBRIUM) capsule 50 mg (has no administration in time range)  lactated ringers  bolus 1,000 mL (0 mLs Intravenous Stopped 05/31/21 2155)  potassium chloride SA (KLOR-CON) CR tablet 40 mEq (40 mEq Oral Given 05/31/21 2018)  LORazepam (ATIVAN) injection 2 mg (2 mg Intravenous Given 05/31/21 2138)     ED Discharge Orders          Ordered    chlordiazePOXIDE (LIBRIUM) 25 MG capsule        05/31/21 2257             Note:  This document was prepared using Dragon voice recognition software and may include unintentional dictation errors.    Lucrezia Starch, MD 05/31/21 2206    Lucrezia Starch, MD 05/31/21 2300
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
## 2021-06-04 ENCOUNTER — Other Ambulatory Visit: Payer: Self-pay | Admitting: Student in an Organized Health Care Education/Training Program

## 2021-06-04 DIAGNOSIS — G894 Chronic pain syndrome: Secondary | ICD-10-CM

## 2021-06-04 DIAGNOSIS — M5416 Radiculopathy, lumbar region: Secondary | ICD-10-CM

## 2021-07-10 ENCOUNTER — Telehealth: Payer: Self-pay | Admitting: Student in an Organized Health Care Education/Training Program

## 2021-07-10 NOTE — Telephone Encounter (Signed)
Called patient and he states that he is having tingling and numbness  in his left arm . Radiates down arm to hand, effects pinkie and ring finger. States this has been going on for two months. What are your suggestions?  Will be glad to call him back.

## 2021-07-11 ENCOUNTER — Other Ambulatory Visit: Payer: Self-pay | Admitting: Psychiatry

## 2021-07-11 DIAGNOSIS — F4310 Post-traumatic stress disorder, unspecified: Secondary | ICD-10-CM

## 2021-07-11 NOTE — Telephone Encounter (Signed)
Can you schedule this patient for CESI

## 2021-07-12 ENCOUNTER — Ambulatory Visit (HOSPITAL_BASED_OUTPATIENT_CLINIC_OR_DEPARTMENT_OTHER): Payer: Medicare Other | Admitting: Student in an Organized Health Care Education/Training Program

## 2021-07-12 ENCOUNTER — Encounter: Payer: Self-pay | Admitting: Student in an Organized Health Care Education/Training Program

## 2021-07-12 ENCOUNTER — Other Ambulatory Visit: Payer: Self-pay

## 2021-07-12 ENCOUNTER — Ambulatory Visit
Admission: RE | Admit: 2021-07-12 | Discharge: 2021-07-12 | Disposition: A | Discharge status: Home / Self Care | Payer: Medicare Other | Source: Ambulatory Visit | Attending: Student in an Organized Health Care Education/Training Program | Admitting: Student in an Organized Health Care Education/Training Program

## 2021-07-12 DIAGNOSIS — G894 Chronic pain syndrome: Secondary | ICD-10-CM | POA: Insufficient documentation

## 2021-07-12 DIAGNOSIS — M5412 Radiculopathy, cervical region: Secondary | ICD-10-CM | POA: Diagnosis not present

## 2021-07-12 DIAGNOSIS — M5416 Radiculopathy, lumbar region: Secondary | ICD-10-CM | POA: Diagnosis not present

## 2021-07-12 MED ORDER — ROPIVACAINE HCL 2 MG/ML IJ SOLN
INTRAMUSCULAR | Status: AC
  Filled 2021-07-12: qty 20

## 2021-07-12 MED ORDER — ROPIVACAINE HCL 2 MG/ML IJ SOLN
1.0000 mL | Freq: Once | INTRAMUSCULAR | Status: AC
  Administered 2021-07-12: 20 mL via EPIDURAL

## 2021-07-12 MED ORDER — SODIUM CHLORIDE 0.9% FLUSH
1.0000 mL | Freq: Once | INTRAVENOUS | Status: AC
  Administered 2021-07-12: 10 mL

## 2021-07-12 MED ORDER — LIDOCAINE HCL 2 % IJ SOLN
20.0000 mL | Freq: Once | INTRAMUSCULAR | Status: AC
  Administered 2021-07-12: 400 mg

## 2021-07-12 MED ORDER — IOHEXOL 180 MG/ML  SOLN
INTRAMUSCULAR | Status: AC
  Filled 2021-07-12: qty 20

## 2021-07-12 MED ORDER — DEXAMETHASONE SODIUM PHOSPHATE 10 MG/ML IJ SOLN
INTRAMUSCULAR | Status: AC
  Filled 2021-07-12: qty 1

## 2021-07-12 MED ORDER — SODIUM CHLORIDE (PF) 0.9 % IJ SOLN
INTRAMUSCULAR | Status: AC
  Filled 2021-07-12: qty 10

## 2021-07-12 MED ORDER — IOHEXOL 180 MG/ML  SOLN
10.0000 mL | Freq: Once | INTRAMUSCULAR | Status: AC
  Administered 2021-07-12: 5 mL via EPIDURAL

## 2021-07-12 MED ORDER — DEXAMETHASONE SODIUM PHOSPHATE 10 MG/ML IJ SOLN
10.0000 mg | Freq: Once | INTRAMUSCULAR | Status: AC
  Administered 2021-07-12: 10 mg

## 2021-07-12 NOTE — Progress Notes (Signed)
PROVIDER NOTE: Interpretation of information contained herein should be left to medically-trained personnel. Specific patient instructions are provided elsewhere under "Patient Instructions" section of medical record. This document was created in part using STT-dictation technology, any transcriptional errors that may result from this process are unintentional.  Patient: Johnny Villa Type: Established DOB: Jun 04, 1976 MRN: [id number] PCP: Johnny Villa  Service: Procedure DOS: 07/12/2021 Setting: Ambulatory Location: Ambulatory outpatient facility Delivery: Face-to-face Provider: Gillis Santa, Villa Specialty: Interventional Pain Management Specialty designation: 09 Location: Outpatient facility Ref. Prov.: Johnny Villa *   Procedure [facility] Interventional Pain Management )    Procedure: Interlaminar Cervical Epidural Steroid injection (ESI) Laterality: Left Level: C7-T1 Analgesia: Local anesthesia Sedation: None.  Imaging: Fluoroscopy-assisted  Purpose: Diagnostic/Therapeutic Indications: Cervicalgia, cervical radicular pain, degenerative disc disease, severe enough to impact quality of life or function.  NAS-11 score:   Pre-procedure: 7 /10   Post-procedure: 3 /10     1. Cervical radicular pain   2. Lumbar radicular pain   3. Chronic pain syndrome    Pre-Procedure Preparation  Monitoring: As per clinic protocol. Respiration, ETCO2, SpO2, BP, heart rate and rhythm monitor placed and checked for adequate function  Risk Assessment: Vitals:  WHQ:[id number] body mass index is 30.68 kg/m as calculated from the following:   Height as of this encounter: 5\' 11"  (1.803 m).   Weight as of this encounter: 220 lb (99.8 kg)., Rate:86ECG Heart Rate: 76, BP:127/79, Resp:18, Temp:97.9 F (36.6 C), SpO2:98 %  Allergies: He has No Known Allergies.  Precautions: None required  Blood-thinner(s): None at this time  Coagulopathies: Reviewed. None identified.   Active  Infection(s): Reviewed. None identified. Johnny Villa is afebrile   Location setting: Procedure suite Position: Prone, on modified reverse trendelenburg to facilitate breathing, with head in head-cradle. Pillows positioned under chest (below chin-level) with cervical spine flexed. Safety Precautions: Patient was assessed for positional comfort and pressure points before starting the procedure. Prepping solution: DuraPrep (Iodine Povacrylex [0.7% available iodine] and Isopropyl Alcohol, 74% w/w) Prep Area: Entire  cervicothoracic region Approach: percutaneous, paramedial Intended target: Posterior cervical epidural space Materials: Tray: Epidural Needle(s): Epidural (Tuohy) Qty: 1 Length: (76mm) 3.5-inch Gauge: 22G  3 cc solution made of 1cc of preservative-free saline, 1 cc of 0.2% ropivacaine, 1 cc of Decadron 10 mg/cc.    Meds ordered this encounter  Medications   iohexol (OMNIPAQUE) 180 MG/ML injection 10 mL    Must be Myelogram-compatible. If not available, you may substitute with a water-soluble, non-ionic, hypoallergenic, myelogram-compatible radiological contrast medium.   lidocaine (XYLOCAINE) 2 % (with pres) injection 400 mg   ropivacaine (PF) 2 mg/mL (0.2%) (NAROPIN) injection 1 mL   sodium chloride flush (NS) 0.9 % injection 1 mL   dexamethasone (DECADRON) injection 10 mg    Orders Placed This Encounter  Procedures   Lumbar Epidural Injection    Standing Status:   Future    Standing Expiration Date:   08/12/2021    Scheduling Instructions:     Procedure: Interlaminar Lumbar Epidural Steroid injection (LESI)            Laterality: Midline     2 weeks    Order Specific Question:   Where will this procedure be performed?    Answer:   [facility]   DG PAIN CLINIC C-ARM 1-60 MIN NO REPORT    Intraoperative interpretation by procedural physician at [place].    Standing Status:   Standing    Number of Occurrences:   1  Order Specific Question:    Reason for exam:    Answer:   Assistance in needle guidance and placement for procedures requiring needle placement in or near specific anatomical locations not easily accessible without such assistance.     Time-out: 1000 I initiated and conducted the "Time-out" before starting the procedure, as per protocol. The patient was asked to participate by confirming the accuracy of the "Time Out" information. Verification of the correct person, site, and procedure were performed and confirmed by me, the nursing staff, and the patient. "Time-out" conducted as per Joint Commission's Universal Protocol (UP.01.01.01). Procedure checklist: Completed   H&P (Pre-op  Assessment)  Johnny Villa is a 45 y.o. (year old), male patient, seen today for interventional treatment. He  has a past surgical history that includes Appendectomy; Colonoscopy with propofol (N/A, 12/30/2017); and Esophagogastroduodenoscopy (egd) with propofol (N/A, 12/30/2017). Johnny Villa has a current medication list which includes the following prescription(s): lactobacillus, albuterol, amlodipine, bupropion, buspirone, clomiphene, clomipramine, folic acid, gabapentin, lamotrigine, lamotrigine, mirtazapine, multi-vitamin, naproxen sodium, pantoprazole, sildenafil, sumatriptan, zolpidem, lamotrigine, mupirocin ointment, and olanzapine. His primarily concern today is the Neck Pain  He has No Known Allergies.   Last encounter: My last encounter with him was on 07/10/2021. Pertinent problems: Johnny Villa has Cervicalgia; Chronic right shoulder pain; Cervical spondylosis without myelopathy (C4,5,6,7); Cervical facet joint syndrome; and Lumbar radicular pain on their pertinent problem list. Pain Assessment: Severity of Chronic pain is reported as a 7 /10. Location: Neck Posterior, Right, Left/Radaites from back of neck into top of forarms into the top of hand and caused hand weakness (left side is worse). Onset: More than a month ago. Quality: Tingling,  Numbness, Sharp, Aching, Constant. Timing: Constant. Modifying factor(s): medications are not helping at the moement - nothing touching the pain. Vitals:  height is 5\' 11"  (1.803 m) and weight is 220 lb (99.8 kg). His temporal temperature is 97.9 F (36.6 C). His blood pressure is 124/85 and his pulse is 86. His respiration is 20 and oxygen saturation is 95%.   Reason for encounter: Interventional pain management therapy due pain of at least four (4) weeks in duration, with to failure to respond to and/or inability to tolerate more conservative care.   Related imaging: Cervical MR wo contrast:  Results for orders placed during the hospital encounter of 12/25/17  MR CERVICAL SPINE WO CONTRAST  Narrative CLINICAL DATA:  Football injury in 1996. Left-sided neck pain with tingling of the shoulder and left arm over the last 6 months.  EXAM: MRI CERVICAL SPINE WITHOUT CONTRAST  TECHNIQUE: Multiplanar, multisequence MR imaging of the cervical spine was performed. No intravenous contrast was administered.  COMPARISON:  Radiography 11/26/2017  FINDINGS: Alignment: Normal  Vertebrae: Normal  Cord: Normal  Posterior Fossa, vertebral arteries, paraspinal tissues: Normal  Disc levels:  No abnormality at the foramen magnum, C1-2 or C2-3.  C3-4: Minimal uncovertebral hypertrophy. No significant canal or foraminal narrowing.  C4-5: Bilateral uncovertebral hypertrophy left more than right. No central canal stenosis. Foraminal narrowing left worse than right. This could affect the left C5 nerve.  C5-6: Bilateral uncovertebral hypertrophy. Suspicion of a small left foraminal disc herniation. No central canal stenosis. Bilateral foraminal narrowing could affect either C6 nerve, more likely the left because of the foraminal disc.  C6-7: Normal  C7-T1: Normal  IMPRESSION: Degenerative spondylosis, more pronounced at C4-5 and C5-6 than at C3-4. There is foraminal narrowing at those  levels that would have potential to affect either C5 or C6 nerve. This  is more pronounced on the left than the right. At C5-6, I suspect there is a small foraminal disc component that probably explains the recent clinical change.   Electronically Signed By: Nelson Chimes M.D. On: 12/25/2017 13:51    DG Cervical Spine Complete  Narrative CLINICAL DATA:  Neck and right shoulder pain, no acute injury  EXAM: CERVICAL SPINE - COMPLETE 4+ VIEW  COMPARISON:  None.  FINDINGS: The cervical vertebrae are in normal alignment. Intervertebral disc spaces appear normal. Minimal anterior osteophyte formation is present at C4-5 and C5-6 levels. No prevertebral soft tissue swelling is seen. On oblique views the foramina are widely patent. The odontoid process is intact. The lung apices are clear.  IMPRESSION: Normal alignment with normal intervertebral disc spaces. Minimal anterior osteophyte formation at C4-5 and C5-6.   Electronically Signed By: Ivar Drape M.D. On: 11/26/2017 10:59    Site Confirmation: Mr. Barriere was asked to confirm the procedure and laterality before marking the site.  Consent: Before the procedure and under the influence of no sedative(s), amnesic(s), or anxiolytics, the patient was informed of the treatment options, risks and possible complications. To fulfill our ethical and legal obligations, as recommended by the American Medical Association's Code of Ethics, I have informed the patient of my clinical impression; the nature and purpose of the treatment or procedure; the risks, benefits, and possible complications of the intervention; the alternatives, including doing nothing; the risk(s) and benefit(s) of the alternative treatment(s) or procedure(s); and the risk(s) and benefit(s) of doing nothing. The patient was provided information about the general risks and possible complications associated with the procedure. These may include, but are not limited to:  failure to achieve desired goals, infection, bleeding, organ or nerve damage, allergic reactions, paralysis, and death. In addition, the patient was informed of those risks and complications associated to Spine-related procedures, such as failure to decrease pain; infection (i.e.: Meningitis, epidural or intraspinal abscess); bleeding (i.e.: epidural hematoma, subarachnoid hemorrhage, or any other type of intraspinal or peri-dural bleeding); organ or nerve damage (i.e.: Any type of peripheral nerve, nerve root, or spinal cord injury) with subsequent damage to sensory, motor, and/or autonomic systems, resulting in permanent pain, numbness, and/or weakness of one or several areas of the body; allergic reactions; (i.e.: anaphylactic reaction); and/or death. Furthermore, the patient was informed of those risks and complications associated with the medications. These include, but are not limited to: allergic reactions (i.e.: anaphylactic or anaphylactoid reaction(s)); adrenal axis suppression; blood sugar elevation that in diabetics may result in ketoacidosis or comma; water retention that in patients with history of congestive heart failure may result in shortness of breath, pulmonary edema, and decompensation with resultant heart failure; weight gain; swelling or edema; medication-induced neural toxicity; particulate matter embolism and blood vessel occlusion with resultant organ, and/or nervous system infarction; and/or aseptic necrosis of one or more joints. Finally, the patient was informed that Medicine is not an exact science; therefore, there is also the possibility of unforeseen or unpredictable risks and/or possible complications that may result in a catastrophic outcome. The patient indicated having understood very clearly. We have given the patient no guarantees and we have made no promises. Enough time was given to the patient to ask questions, all of which were answered to the patient's satisfaction. Mr.  Trombly has indicated that he wanted to continue with the procedure. Attestation: I, the ordering provider, attest that I have discussed with the patient the benefits, risks, side-effects, alternatives, likelihood of achieving goals, and  potential problems during recovery for the procedure that I have provided informed consent.  Date  Time: 07/12/2021  9:33 AM    Description of procedure   Start Time: 1000 hrs  Local Anesthesia: Once the patient was positioned, prepped, and time-out was completed. The target area was identified located. The skin was marked with an approved surgical skin marker. Once marked, the skin (epidermis, dermis, and hypodermis), and deeper tissues (fat, connective tissue and muscle) were infiltrated with a small amount of a short-acting local anesthetic, loaded on a 10cc syringe with a 25G, 1.5-in  Needle. An appropriate amount of time was allowed for local anesthetics to take effect before proceeding to the next step. Local Anesthetic: Lidocaine 1-2% The unused portion of the local anesthetic was discarded in the proper designated containers. Safety Precautions: Aspiration looking for blood return was conducted prior to all injections. At no point did I inject any substances, as a needle was being advanced. Before injecting, the patient was told to immediately notify me if he was experiencing any new onset of "ringing in the ears, or metallic taste in the mouth". No attempts were made at seeking any paresthesias. Safe injection practices and needle disposal techniques used. Medications properly checked for expiration dates. SDV (single dose vial) medications used. After the completion of the procedure, all disposable equipment used was discarded in the proper designated medical waste containers.  Technical description: Protocol guidelines were followed. Using fluoroscopic guidance, the epidural needle was introduced through the skin, ipsilateral to the reported pain, and  advanced to the target area. Posterior laminar os was contacted and the needle walked caudad, until the lamina was cleared. The ligamentum flavum was engaged and the epidural space identified using "loss-of-resistance technique" with 2-3 ml of PF-NaCl (0.9% NSS), in a 5cc dedicated LOR syringe. See "Imaging guidance" below for use of contrast details.  Injection: Once satisfactory needle placement was confirmed, I proceeded to inject the desired solution in slow, incremental fashion, intermittently assessing for discomfort or any signs of abnormal or undesired spread of substance. Once completed, the needle was removed and disposed of, as per hospital protocols.   Vitals:   07/12/21 0936 07/12/21 1001 07/12/21 1004 07/12/21 1005  BP: 127/79 122/83 124/85 124/85  Pulse: 86     Resp: 18 15 18 20   Temp: 97.9 F (36.6 C)     TempSrc: Temporal     SpO2: 98% 95% 96% 95%  Weight: 220 lb (99.8 kg)     Height: 5\' 11"  (1.803 m)       End Time: 1004 hrs  Once the entire procedure was completed, the treated area was cleaned, making sure to leave some of the prepping solution back to take advantage of its long term bactericidal properties.   Imaging guidance  Type of Imaging Technique: Fluoroscopy Guidance (Spinal) Indication(s): Assistance in needle guidance and placement for procedures requiring needle placement in or near specific anatomical locations not easily accessible without such assistance. Exposure Time: Please see nurses notes for exact fluoroscopy time. Contrast: Before injecting any contrast, we confirmed that the patient did not have an allergy to iodine, shellfish, or radiological contrast. Once satisfactory needle placement was completed, radiological contrast was injected under continuous fluoroscopic guidance. Injection of contrast accomplished without complications. See chart for type and volume of contrast used. Fluoroscopic Guidance: I was personally present in the fluoroscopy  suite, where the patient was placed in position for the procedure, over the fluoroscopy-compatible table. Fluoroscopy was manipulated, using "Tunnel Vision  Technique", to obtain the best possible view of the target area, on the affected side. Parallax error was corrected before commencing the procedure. A "direction-depth-direction" technique was used to introduce the needle under continuous pulsed fluoroscopic guidance. Once the target was reached, antero-posterior, oblique, and lateral fluoroscopic projection views were taken to confirm needle placement in all planes. Electronic images uploaded into EMR.  Interpretation: Successful epidural injection. Intraoperative imaging interpretation by performing Physician.    Post-op assessment  Post-procedure Vital Signs:  Pulse/HCG Rate: 8676 Temp: 97.9 F (36.6 C) Resp: 20 BP: 124/85 SpO2: 95 %  EBL: None  Complications: No immediate post-treatment complications observed by team, or reported by patient.  Note: The patient tolerated the entire procedure well. A repeat set of vitals were taken after the procedure and the patient was kept under observation following institutional policy, for this type of procedure. Post-procedural neurological assessment was performed, showing return to baseline, prior to discharge. The patient was provided with post-procedure discharge instructions, including a section on how to identify potential problems. Should any problems arise concerning this procedure, the patient was given instructions to immediately contact us, at any time, without hesitation. In any case, we plan to contact the patient by telephone for a follow-up status report regarding this interventional procedure.  Comments:  No additional relevant information.   Plan of care    Patient also experiencing increased low back pain with radiation into bilateral legs in a dermatomal distribution.  Requesting lumbar epidural steroid injection.  Previous  lumbar ESI was 01/11/2020 that provided 75% pain relief for approximately 4 to 5 months.  Risk and benefits reviewed and patient like to proceed.  Order in place, staff will release.  Medications administered: We administered iohexol, lidocaine, ropivacaine (PF) 2 mg/mL (0.2%), sodium chloride flush, and dexamethasone.  Follow-up plan:   Return in about 2 weeks (around 07/26/2021) for L-ESI , without sedation.      Status post right C4, C5, C6, C7 RFA on 05/19/2018 for cervical spondylosis.  Status post right L4-L5 ESI #1 on 01/11/2020, repeat as needed        Recent Visits Date Type Provider Dept  05/02/21 Telemedicine Johnny Villa [facility]  Showing recent visits within past 90 days and meeting all other requirements Today's Visits Date Type Provider Dept  07/12/21 Procedure visit Johnny Villa [facility]  Showing today's visits and meeting all other requirements Future Appointments No visits were found meeting these conditions. Showing future appointments within next 90 days and meeting all other requirements  Disposition: Discharge home  Discharge (Date  Time): 07/12/2021; 1010 hrs.   Primary Care Physician: Johnny Villa Location: [facility] Note by: Johnny Villa Date: 07/12/2021; Time: 10:07 AM  DISCLAIMER: Medicine is not an exact science. It has no guarantees or warranties. The decision to proceed with this intervention was based on the information collected from the patient. Conclusions were drawn from the patient's questionnaire, interview, and examination. Because information was provided in large part by the patient, it cannot be guaranteed that it has not been purposely or unconsciously manipulated or altered. Every effort has been made to obtain as much accurate, relevant, available data as possible. Always take into account that the treatment will also be dependent on availability of resources  and existing treatment guidelines, considered by other Pain Management Specialists as being common knowledge and practice, at the time of the intervention. It is also important to point out that variation  in procedural techniques and pharmacological choices are the acceptable norm. For Medico-Legal review purposes, the indications, contraindications, technique, and results of the these procedures should only be evaluated, judged and interpreted by a Board-Certified Interventional Pain Specialist with extensive familiarity and expertise in the same exact procedure and technique.

## 2021-07-12 NOTE — Progress Notes (Signed)
Safety precautions to be maintained throughout the outpatient stay will include: orient to surroundings, keep bed in low position, maintain call bell within reach at all times, provide assistance with transfer out of bed and ambulation.   Al Decant, RN

## 2021-07-12 NOTE — Patient Instructions (Addendum)
____________________________________________________________________________________________  Post-procedure Information What to expect: Most procedures involve the use of a local anesthetic (numbing medicine), and a steroid (anti-inflammatory medicine).  The local anesthetics may cause temporary numbness and weakness of the legs or arms, depending on the location of the block. This numbness/weakness may last 4-6 hours, depending on the local anesthetic used. In rare instances, it can last up to 24 hours. While numb, you must be very careful not to injure the extremity.  After any procedure, you could expect the pain to get better within 15-20 minutes. This relief is temporary and may last 4-6 hours. Once the local anesthetics wears off, you could experience discomfort, possibly more than usual, for up to 10 (ten) days. In the case of radiofrequencies, it may last up to 6 weeks. Surgeries may take up to 8 weeks for the healing process. The discomfort is due to the irritation caused by needles going through skin and muscle. To minimize the discomfort, we recommend using ice the first day, and heat from then on. The ice should be applied for 15 minutes on, and 15 minutes off. Keep repeating this cycle until bedtime. Avoid applying the ice directly to the skin, to prevent frostbite. Heat should be used daily, until the pain improves (4-10 days). Be careful not to burn yourself.  Occasionally you may experience muscle spasms or cramps. These occur as a consequence of the irritation caused by the needle sticks to the muscle and the blood that will inevitably be lost into the surrounding muscle tissue. Blood tends to be very irritating to tissues, which tend to react by going into spasm. These spasms may start the same day of your procedure, but they may also take days to develop. This late onset type of spasm or cramp is usually caused by electrolyte imbalances triggered by the steroids, at the level of the  kidney. Cramps and spasms tend to respond well to muscle relaxants, multivitamins (some are triggered by the procedure, but may have their origins in vitamin deficiencies), and "Gatorade", or any sports drinks that can replenish any electrolyte imbalances. (If you are a diabetic, ask your pharmacist to get you a sugar-free brand.) Warm showers or baths may also be helpful. Stretching exercises are highly recommended.  General Instructions:  Be alert for signs of possible infection: redness, swelling, heat, red streaks, elevated temperature, and/or fever. These typically appear 4 to 6 days after the procedure. Immediately notify your doctor if you experience unusual bleeding, difficulty breathing, or loss of bowel or bladder control. If you experience increased pain, do not increase your pain medicine intake, unless instructed by your pain physician.  Post-Procedure Care:  Be careful in moving about. Muscle spasms in the area of the injection may occur. Applying ice or heat to the area is often helpful. The incidence of spinal headaches after epidural injections ranges between 1.4% and 6%. If you develop a headache that does not seem to respond to conservative therapy, please let your physician know. This can be treated with an epidural blood patch.   Post-procedure numbness or redness is to be expected, however it should average 4 to 6 hours. If numbness and weakness of your extremities begins to develop 4 to 6 hours after your procedure, and is felt to be progressing and worsening, immediately contact your physician.  Diet:  If you experience nausea, do not eat until this sensation goes away. If you had a "Stellate Ganglion Block" for upper extremity "Reflex Sympathetic Dystrophy", do not eat or   drink until your hoarseness goes away. In any case, always start with liquids first and if you tolerate them well, then slowly progress to more solid foods.  Activity:  For the first 4 to 6 hours after the  procedure, use caution in moving about as you may experience numbness and/or weakness. Use caution in cooking, using household electrical appliances, and climbing steps. If you need to reach your Doctor call our office: [phone] (During business hours) or [phone] (After business hours).  Business Hours: Monday-Thursday 8:00 am - 4:00 PM    Fridays: Closed     In case of an emergency: In case of emergency, call 911 or go to the nearest emergency room and have the physician there call us.  Interpretation of Procedure Every nerve block has two components: a diagnostic component, and a treatment component. Unrealistic expectations are the most common causes of "perceived failure".  In a perfect world, a single nerve block should be able to completely and permanently eliminate the pain. Sadly, the world is not perfect.  Most pain management nerve blocks are performed using local anesthetics and steroids. Steroids are responsible for any long-term benefit that you may experience. Their purpose is to decrease any chronic swelling that may exist in the area. Steroids begin to work immediately after being injected. However, most patients will not experience any benefits until 5 to 10 days after the injection, when the swelling has come down to the point where they can tell a difference. Steroids will only help if there is swelling to be treated. As such, they can assist with the diagnosis. If effective, they suggest an inflammatory component to the pain, and if ineffective, they rule out inflammation as the main cause or component of the problem. If the problem is one of mechanical compression, you will get no benefit from those steroids.   In the case of local anesthetics, they have a crucial role in the diagnosis of your condition. Most will begin to work within15 to 20 minutes after injection. The duration will depend on the type used (short- vs. Long-acting). It is of outmost importance that  patients keep tract of their pain, after the procedure. To assist with this matter, a "Post-procedure Pain Diary" is provided. Make sure to complete it and to bring it back to your follow-up appointment.  As long as the patient keeps accurate, detailed records of their symptoms after every procedure, and returns to have those interpreted, every procedure will provide us with invaluable information. Even a block that does not provide the patient with any relief, will always provide us with information about the mechanism and the origin of the pain. The only time a nerve block can be considered a waste of time is when patients do not keep track of the results, or do not keep their post-procedure appointment.  Reporting the results back to your physician The Pain Score  Pain is a subjective complaint. It cannot be seen, touched, or measured. We depend entirely on the patient's report of the pain in order to assess your condition and treatment. To evaluate the pain, we use a pain scale, where "0" means "No Pain", and a "10" is "the worst possible pain that you can even imagine" (i.e. something like been eaten alive by a shark or being torn apart by a lion).   Use the Pain Scale provided. You will frequently be asked to rate your pain. Please be accurate, remember that medical decisions will be based on your   responses. Please do not rate your pain above a 10. Doing so is actually interpreted as "symptom magnification" (exaggeration). To put this into perspective, when you tell us that your pain is at a 10 (ten), what you are saying is that there is nothing we can do to make this pain any worse. (Carefully think about that.) ____________________________________________________________________________________________  ______________________________________________________________________  Preparing for your procedure (without sedation)  Procedure appointments are limited to planned procedures: No  Prescription Refills. No disability issues will be discussed. No medication changes will be discussed.  Instructions: Oral Intake: Do not eat or drink anything for at least 6 hours prior to your procedure. (Exception: Blood Pressure Medication. See below.) Transportation: Unless otherwise stated by your physician, you may drive yourself after the procedure. Blood Pressure Medicine: Do not forget to take your blood pressure medicine with a sip of water the morning of the procedure. If your Diastolic (lower reading)is above 100 mmHg, elective cases will be cancelled/rescheduled. Blood thinners: These will need to be stopped for procedures. Notify our staff if you are taking any blood thinners. Depending on which one you take, there will be specific instructions on how and when to stop it. Diabetics on insulin: Notify the staff so that you can be scheduled 1st case in the morning. If your diabetes requires high dose insulin, take only  of your normal insulin dose the morning of the procedure and notify the staff that you have done so. Preventing infections: Shower with an antibacterial soap the morning of your procedure.  Build-up your immune system: Take 1000 mg of Vitamin C with every meal (3 times a day) the day prior to your procedure. Antibiotics: Inform the staff if you have a condition or reason that requires you to take antibiotics before dental procedures. Pregnancy: If you are pregnant, call and cancel the procedure. Sickness: If you have a cold, fever, or any active infections, call and cancel the procedure. Arrival: You must be in the facility at least 30 minutes prior to your scheduled procedure. Children: Do not bring any children with you. Dress appropriately: Bring dark clothing that you would not mind if they get stained. Valuables: Do not bring any jewelry or valuables.  Reasons to call and reschedule or cancel your procedure: (Following these recommendations will minimize the  risk of a serious complication.) Surgeries: Avoid having procedures within 2 weeks of any surgery. (Avoid for 2 weeks before or after any surgery). Flu Shots: Avoid having procedures within 2 weeks of a flu shots or . (Avoid for 2 weeks before or after immunizations). Barium: Avoid having a procedure within 7-10 days after having had a radiological study involving the use of radiological contrast. (Myelograms, Barium swallow or enema study). Heart attacks: Avoid any elective procedures or surgeries for the initial 6 months after a "Myocardial Infarction" (Heart Attack). Blood thinners: It is imperative that you stop these medications before procedures. Let us know if you if you take any blood thinner.  Infection: Avoid procedures during or within two weeks of an infection (including chest colds or gastrointestinal problems). Symptoms associated with infections include: Localized redness, fever, chills, night sweats or profuse sweating, burning sensation when voiding, cough, congestion, stuffiness, runny nose, sore throat, diarrhea, nausea, vomiting, cold or Flu symptoms, recent or current infections. It is specially important if the infection is over the area that we intend to treat. Heart and lung problems: Symptoms that may suggest an active cardiopulmonary problem include: cough, chest pain, breathing difficulties or shortness of  breath, dizziness, ankle swelling, uncontrolled high or unusually low blood pressure, and/or palpitations. If you are experiencing any of these symptoms, cancel your procedure and contact your primary care physician for an evaluation.  Remember:  Regular Business hours are:  Monday to Thursday 8:00 AM to 4:00 PM  Provider's Schedule: Milinda Pointer, MD:  Procedure days: Tuesday and Thursday 7:30 AM to 4:00 PM  Gillis Santa, MD:  Procedure days: Monday and Wednesday 7:30 AM to 4:00 PM ______________________________________________________________________

## 2021-07-13 ENCOUNTER — Telehealth: Payer: Self-pay | Admitting: *Deleted

## 2021-07-13 NOTE — Telephone Encounter (Signed)
Post procedure call, voicemail left asking patient to call if there are any questions or concerns.

## 2021-07-17 DIAGNOSIS — S43431A Superior glenoid labrum lesion of right shoulder, initial encounter: Secondary | ICD-10-CM | POA: Diagnosis not present

## 2021-07-17 DIAGNOSIS — M7521 Bicipital tendinitis, right shoulder: Secondary | ICD-10-CM | POA: Diagnosis not present

## 2021-07-17 DIAGNOSIS — S46811A Strain of other muscles, fascia and tendons at shoulder and upper arm level, right arm, initial encounter: Secondary | ICD-10-CM | POA: Diagnosis not present

## 2021-07-17 DIAGNOSIS — M898X1 Other specified disorders of bone, shoulder: Secondary | ICD-10-CM | POA: Diagnosis not present

## 2021-07-26 ENCOUNTER — Encounter: Payer: Self-pay | Admitting: Student in an Organized Health Care Education/Training Program

## 2021-07-26 ENCOUNTER — Ambulatory Visit
Admission: RE | Admit: 2021-07-26 | Discharge: 2021-07-26 | Disposition: A | Discharge status: Home / Self Care | Payer: Medicare Other | Source: Ambulatory Visit | Attending: Student in an Organized Health Care Education/Training Program | Admitting: Student in an Organized Health Care Education/Training Program

## 2021-07-26 ENCOUNTER — Other Ambulatory Visit: Payer: Self-pay

## 2021-07-26 ENCOUNTER — Ambulatory Visit (HOSPITAL_BASED_OUTPATIENT_CLINIC_OR_DEPARTMENT_OTHER): Payer: Medicare Other | Admitting: Student in an Organized Health Care Education/Training Program

## 2021-07-26 VITALS — BP 150/98 | HR 96 | Temp 96.8°F | Resp 16 | Ht 71.0 in | Wt 225.0 lb

## 2021-07-26 DIAGNOSIS — G894 Chronic pain syndrome: Secondary | ICD-10-CM | POA: Diagnosis not present

## 2021-07-26 DIAGNOSIS — M5416 Radiculopathy, lumbar region: Secondary | ICD-10-CM

## 2021-07-26 DIAGNOSIS — M5412 Radiculopathy, cervical region: Secondary | ICD-10-CM | POA: Insufficient documentation

## 2021-07-26 MED ORDER — ROPIVACAINE HCL 2 MG/ML IJ SOLN
INTRAMUSCULAR | Status: AC
  Filled 2021-07-26: qty 20

## 2021-07-26 MED ORDER — LIDOCAINE HCL 2 % IJ SOLN
20.0000 mL | Freq: Once | INTRAMUSCULAR | Status: AC
  Administered 2021-07-26: 400 mg

## 2021-07-26 MED ORDER — DEXAMETHASONE SODIUM PHOSPHATE 10 MG/ML IJ SOLN
INTRAMUSCULAR | Status: AC
  Filled 2021-07-26: qty 1

## 2021-07-26 MED ORDER — LIDOCAINE HCL 2 % IJ SOLN
INTRAMUSCULAR | Status: AC
  Filled 2021-07-26: qty 20

## 2021-07-26 MED ORDER — SODIUM CHLORIDE (PF) 0.9 % IJ SOLN
INTRAMUSCULAR | Status: AC
  Filled 2021-07-26: qty 10

## 2021-07-26 MED ORDER — SODIUM CHLORIDE 0.9% FLUSH
2.0000 mL | Freq: Once | INTRAVENOUS | Status: AC
  Administered 2021-07-26: 2 mL

## 2021-07-26 MED ORDER — IOHEXOL 180 MG/ML  SOLN
INTRAMUSCULAR | Status: AC
  Filled 2021-07-26: qty 20

## 2021-07-26 MED ORDER — DEXAMETHASONE SODIUM PHOSPHATE 10 MG/ML IJ SOLN
10.0000 mg | Freq: Once | INTRAMUSCULAR | Status: AC
  Administered 2021-07-26: 10 mg

## 2021-07-26 MED ORDER — IOHEXOL 180 MG/ML  SOLN
10.0000 mL | Freq: Once | INTRAMUSCULAR | Status: AC
  Administered 2021-07-26: 10 mL via EPIDURAL

## 2021-07-26 MED ORDER — ROPIVACAINE HCL 2 MG/ML IJ SOLN
2.0000 mL | Freq: Once | INTRAMUSCULAR | Status: AC
  Administered 2021-07-26: 2 mL via EPIDURAL

## 2021-07-26 NOTE — Progress Notes (Signed)
PROVIDER NOTE: Information contained herein reflects review and annotations entered in association with encounter. Interpretation of such information and data should be left to medically-trained personnel. Information provided to patient can be located elsewhere in the medical record under "Patient Instructions". Document created using STT-dictation technology, any transcriptional errors that may result from process are unintentional.    Patient: Johnny Villa  Service Category: Procedure  Provider: Gillis Santa, MD  DOB: [birth year]  DOS: 07/26/2021  Location: [facility]  MRN: [id number]  Setting: Ambulatory - outpatient  Referring Provider: McLean-Scocuzza, Olivia Mackie *  Type: Established Patient  Specialty: Interventional Pain Management  PCP: McLean-Scocuzza, Nino Glow, MD   Primary Reason for Visit: Interventional Pain Management Treatment. CC: low back and right leg pain  Procedure:          Anesthesia, Analgesia, Anxiolysis:  Type: Therapeutic Inter-Laminar Epidural Steroid Injection  #2  Region: Lumbar Level: L4-5 Level. Laterality: Right-Sided         Type: Local Anesthesia  Local Anesthetic: Lidocaine 1-2%  Position: Prone with head of the table was raised to facilitate breathing.   Indications: 1. Lumbar radicular pain   2. Lumbar radiculopathy   3. Chronic pain syndrome   4. Cervical radicular pain    Pain Score: Pre-procedure: 7 /10 Post-procedure: 7 /10   Pre-op Assessment:  Johnny Villa is a 45 y.o. (year old), male patient, seen today for interventional treatment. He  has a past surgical history that includes Appendectomy; Colonoscopy with propofol (N/A, 12/30/2017); and Esophagogastroduodenoscopy (egd) with propofol (N/A, 12/30/2017). Johnny Villa has a current medication list which includes the following prescription(s): lactobacillus, albuterol, amlodipine, bupropion, buspirone, clomiphene, clomipramine, folic acid, gabapentin, lamotrigine, lamotrigine,  lamotrigine, mirtazapine, multi-vitamin, mupirocin ointment, naproxen sodium, olanzapine, pantoprazole, sildenafil, sumatriptan, and zolpidem. His primarily concern today is the Back Pain (right)  Initial Vital Signs:  Pulse/HCG Rate: 98 (NSR)  Temp: (!) 96.8 F (36 C) Resp: 16 BP: (!) 150/87 SpO2: 98 %  BMI: Estimated body mass index is 31.38 kg/m as calculated from the following:   Height as of this encounter: 5\' 11"  (1.803 m).   Weight as of this encounter: 225 lb (102.1 kg).  Risk Assessment: Allergies: Reviewed. He has No Known Allergies.  Allergy Precautions: None required Coagulopathies: Reviewed. None identified.  Blood-thinner therapy: None at this time Active Infection(s): Reviewed. None identified. Johnny Villa is afebrile  Site Confirmation: Johnny Villa was asked to confirm the procedure and laterality before marking the site Procedure checklist: Completed Consent: Before the procedure and under the influence of no sedative(s), amnesic(s), or anxiolytics, the patient was informed of the treatment options, risks and possible complications. To fulfill our ethical and legal obligations, as recommended by the American Medical Association's Code of Ethics, I have informed the patient of my clinical impression; the nature and purpose of the treatment or procedure; the risks, benefits, and possible complications of the intervention; the alternatives, including doing nothing; the risk(s) and benefit(s) of the alternative treatment(s) or procedure(s); and the risk(s) and benefit(s) of doing nothing. The patient was provided information about the general risks and possible complications associated with the procedure. These may include, but are not limited to: failure to achieve desired goals, infection, bleeding, organ or nerve damage, allergic reactions, paralysis, and death. In addition, the patient was informed of those risks and complications associated to Spine-related procedures,  such as failure to decrease pain; infection (i.e.: Meningitis, epidural or intraspinal abscess); bleeding (i.e.: epidural hematoma, subarachnoid hemorrhage, or any other type  of intraspinal or peri-dural bleeding); organ or nerve damage (i.e.: Any type of peripheral nerve, nerve root, or spinal cord injury) with subsequent damage to sensory, motor, and/or autonomic systems, resulting in permanent pain, numbness, and/or weakness of one or several areas of the body; allergic reactions; (i.e.: anaphylactic reaction); and/or death. Furthermore, the patient was informed of those risks and complications associated with the medications. These include, but are not limited to: allergic reactions (i.e.: anaphylactic or anaphylactoid reaction(s)); adrenal axis suppression; blood sugar elevation that in diabetics may result in ketoacidosis or comma; water retention that in patients with history of congestive heart failure may result in shortness of breath, pulmonary edema, and decompensation with resultant heart failure; weight gain; swelling or edema; medication-induced neural toxicity; particulate matter embolism and blood vessel occlusion with resultant organ, and/or nervous system infarction; and/or aseptic necrosis of one or more joints. Finally, the patient was informed that Medicine is not an exact science; therefore, there is also the possibility of unforeseen or unpredictable risks and/or possible complications that may result in a catastrophic outcome. The patient indicated having understood very clearly. We have given the patient no guarantees and we have made no promises. Enough time was given to the patient to ask questions, all of which were answered to the patient's satisfaction. Johnny Villa has indicated that he wanted to continue with the procedure. Attestation: I, the ordering provider, attest that I have discussed with the patient the benefits, risks, side-effects, alternatives, likelihood of achieving  goals, and potential problems during recovery for the procedure that I have provided informed consent. Date  Time: 07/26/2021 10:32 AM  Pre-Procedure Preparation:  Monitoring: As per clinic protocol. Respiration, ETCO2, SpO2, BP, heart rate and rhythm monitor placed and checked for adequate function Safety Precautions: Patient was assessed for positional comfort and pressure points before starting the procedure. Time-out: I initiated and conducted the "Time-out" before starting the procedure, as per protocol. The patient was asked to participate by confirming the accuracy of the "Time Out" information. Verification of the correct person, site, and procedure were performed and confirmed by me, the nursing staff, and the patient. "Time-out" conducted as per Joint Commission's Universal Protocol (UP.01.01.01). Time: 1055  Description of Procedure:          Target Area: The interlaminar space, initially targeting the lower laminar border of the superior vertebral body. Approach: Paramedial approach. Area Prepped: Entire Posterior Lumbar Region DuraPrep (Iodine Povacrylex [0.7% available iodine] and Isopropyl Alcohol, 74% w/w) Safety Precautions: Aspiration looking for blood return was conducted prior to all injections. At no point did we inject any substances, as a needle was being advanced. No attempts were made at seeking any paresthesias. Safe injection practices and needle disposal techniques used. Medications properly checked for expiration dates. SDV (single dose vial) medications used. Description of the Procedure: Protocol guidelines were followed. The procedure needle was introduced through the skin, ipsilateral to the reported pain, and advanced to the target area. Bone was contacted and the needle walked caudad, until the lamina was cleared. The epidural space was identified using "loss-of-resistance technique" with 2-3 ml of PF-NaCl (0.9% NSS), in a 5cc LOR glass syringe.  Vitals:    07/26/21 1035 07/26/21 1050  BP: (!) 150/87 (!) 147/99  Pulse:  98  Resp: 16 18  Temp: (!) 96.8 F (36 C)   TempSrc: Temporal   SpO2:  98%  Weight: 225 lb (102.1 kg)   Height: 5\' 11"  (1.803 m)     Start Time: 1055  hrs. End Time: 1059 hrs.  Materials:  Needle(s) Type: Epidural needle Gauge: 22G Length: 3.5-in Medication(s): Please see orders for medications and dosing details. 7cc solution made of 4 cc of preservative-free saline, 2 cc of 0.2% ropivacaine, 1 cc of Decadron 10 mg/cc.  Imaging Guidance (Spinal):          Type of Imaging Technique: Fluoroscopy Guidance (Spinal) Indication(s): Assistance in needle guidance and placement for procedures requiring needle placement in or near specific anatomical locations not easily accessible without such assistance. Exposure Time: Please see nurses notes. Contrast: Before injecting any contrast, we confirmed that the patient did not have an allergy to iodine, shellfish, or radiological contrast. Once satisfactory needle placement was completed at the desired level, radiological contrast was injected. Contrast injected under live fluoroscopy. No contrast complications. See chart for type and volume of contrast used. Fluoroscopic Guidance: I was personally present during the use of fluoroscopy. "Tunnel Vision Technique" used to obtain the best possible view of the target area. Parallax error corrected before commencing the procedure. "Direction-depth-direction" technique used to introduce the needle under continuous pulsed fluoroscopy. Once target was reached, antero-posterior, oblique, and lateral fluoroscopic projection used confirm needle placement in all planes. Images permanently stored in EMR. Interpretation: I personally interpreted the imaging intraoperatively. Adequate needle placement confirmed in multiple planes. Appropriate spread of contrast into desired area was observed. No evidence of afferent or efferent intravascular uptake. No  intrathecal or subarachnoid spread observed. Permanent images saved into the patient's record.  Post-operative Assessment:  Post-procedure Vital Signs:  Pulse/HCG Rate: 98 (NSR)  Temp: (!) 96.8 F (36 C) Resp: 18 BP: (!) 147/99 SpO2: 98 %  EBL: None  Complications: No immediate post-treatment complications observed by team, or reported by patient.  Note: The patient tolerated the entire procedure well. A repeat set of vitals were taken after the procedure and the patient was kept under observation following institutional policy, for this type of procedure. Post-procedural neurological assessment was performed, showing return to baseline, prior to discharge. The patient was provided with post-procedure discharge instructions, including a section on how to identify potential problems. Should any problems arise concerning this procedure, the patient was given instructions to immediately contact us, at any time, without hesitation. In any case, we plan to contact the patient by telephone for a follow-up status report regarding this interventional procedure.  Comments:  No additional relevant information.  5 out of 5 strength bilateral lower extremity: Plantar flexion, dorsiflexion, knee flexion, knee extension.  Plan of Care   Continues to have cervical radicular pain that is worse on the right.  Would like to repeat cervical epidural steroid injection.  This was previously done on 07/12/2021 that provided moderate pain relief however now he is noticing return of pain. Tolerated lumbar epidural steroid injection today at right L4-L5 without any issues.  Orders:  Orders Placed This Encounter  Procedures   Cervical Epidural Injection    Sedation: Patient's choice. Purpose: Diagnostic/Therapeutic Indication(s): Radiculitis and cervicalgia associater with cervical degenerative disc disease.    Standing Status:   Future    Standing Expiration Date:   10/26/2021    Scheduling Instructions:      Procedure: Cervical Epidural Steroid Injection/Block     Level(s): C7-T1     Laterality: TBD     Timeframe: As soon as schedule allows    Order Specific Question:   Where will this procedure be performed?    Answer:   [facility]    Comments:  [facility] 1-60 MIN NO REPORT    Intraoperative interpretation by procedural physician at [facility].    Standing Status:   Standing    Number of Occurrences:   1    Order Specific Question:   Reason for exam:    Answer:   Assistance in needle guidance and placement for procedures requiring needle placement in or near specific anatomical locations not easily accessible without such assistance.    Medications ordered for procedure: Meds ordered this encounter  Medications   iohexol (OMNIPAQUE) 180 MG/ML injection 10 mL    Must be Myelogram-compatible. If not available, you may substitute with a water-soluble, non-ionic, hypoallergenic, myelogram-compatible radiological contrast medium.   lidocaine (XYLOCAINE) 2 % (with pres) injection 400 mg   dexamethasone (DECADRON) injection 10 mg   sodium chloride flush (NS) 0.9 % injection 2 mL   ropivacaine (PF) 2 mg/mL (0.2%) (NAROPIN) injection 2 mL   Medications administered: We administered iohexol, lidocaine, dexamethasone, sodium chloride flush, and ropivacaine (PF) 2 mg/mL (0.2%).  See the medical record for exact dosing, route, and time of administration.  Follow-up plan:   Return in about 3 weeks (around 08/16/2021) for C-ESI , without sedation.      Status post right C4, C5, C6, C7 RFA on 05/19/2018 for cervical spondylosis.  Status post right L4-L5 ESI #1 on 01/11/2020, #2 07/26/21   Recent Visits Date Type Provider Dept  07/12/21 Procedure visit Gillis Santa, MD [facility]  05/02/21 Telemedicine Gillis Santa, MD [facility]  Showing recent visits within past 90 days and meeting all other requirements Today's Visits Date Type  Provider Dept  07/26/21 Procedure visit Gillis Santa, MD [facility]  Showing today's visits and meeting all other requirements Future Appointments No visits were found meeting these conditions. Showing future appointments within next 90 days and meeting all other requirements  Disposition: Discharge home  Discharge (Date  Time): 07/26/2021;   hrs.   Primary Care Physician: McLean-Scocuzza, Nino Glow, MD Location: [facility] Note by: Gillis Santa, MD Date: 07/26/2021; Time: 11:02 AM  Disclaimer:  Medicine is not an exact science. The only guarantee in medicine is that nothing is guaranteed. It is important to note that the decision to proceed with this intervention was based on the information collected from the patient. The Data and conclusions were drawn from the patient's questionnaire, the interview, and the physical examination. Because the information was provided in large part by the patient, it cannot be guaranteed that it has not been purposely or unconsciously manipulated. Every effort has been made to obtain as much relevant data as possible for this evaluation. It is important to note that the conclusions that lead to this procedure are derived in large part from the available data. Always take into account that the treatment will also be dependent on availability of resources and existing treatment guidelines, considered by other Pain Management Practitioners as being common knowledge and practice, at the time of the intervention. For Medico-Legal purposes, it is also important to point out that variation in procedural techniques and pharmacological choices are the acceptable norm. The indications, contraindications, technique, and results of the above procedure should only be interpreted and judged by a Board-Certified Interventional Pain Specialist with extensive familiarity and expertise in the same exact procedure and technique.

## 2021-07-26 NOTE — Progress Notes (Signed)
Safety precautions to be maintained throughout the outpatient stay will include: orient to surroundings, keep bed in low position, maintain call bell within reach at all times, provide assistance with transfer out of bed and ambulation.  

## 2021-07-27 ENCOUNTER — Telehealth: Payer: Self-pay

## 2021-07-27 NOTE — Telephone Encounter (Signed)
Post procedure phone call.  LM 

## 2021-08-07 ENCOUNTER — Ambulatory Visit: Payer: Medicare Other | Admitting: Student in an Organized Health Care Education/Training Program

## 2021-08-31 DEATH — deceased

## 2021-11-21 ENCOUNTER — Ambulatory Visit: Payer: Medicare Other | Admitting: Internal Medicine

## 2022-03-02 ENCOUNTER — Ambulatory Visit: Payer: Medicare Other
